# Patient Record
Sex: Female | Born: 1945
Health system: Southern US, Community
[De-identification: ages and names within clinical notes are randomized; demographics above are authoritative.]

## PROBLEM LIST (undated history)

## (undated) DIAGNOSIS — I10 Essential (primary) hypertension: Secondary | ICD-10-CM

## (undated) DIAGNOSIS — M069 Rheumatoid arthritis, unspecified: Secondary | ICD-10-CM

## (undated) DIAGNOSIS — F329 Major depressive disorder, single episode, unspecified: Secondary | ICD-10-CM

## (undated) DIAGNOSIS — M797 Fibromyalgia: Secondary | ICD-10-CM

## (undated) DIAGNOSIS — F32A Depression, unspecified: Secondary | ICD-10-CM

## (undated) DIAGNOSIS — F419 Anxiety disorder, unspecified: Secondary | ICD-10-CM

## (undated) DIAGNOSIS — R7309 Other abnormal glucose: Secondary | ICD-10-CM

## (undated) DIAGNOSIS — R739 Hyperglycemia, unspecified: Secondary | ICD-10-CM

## (undated) DIAGNOSIS — E042 Nontoxic multinodular goiter: Secondary | ICD-10-CM

## (undated) DIAGNOSIS — D649 Anemia, unspecified: Secondary | ICD-10-CM

## (undated) HISTORY — PX: ABDOMINAL HYSTERECTOMY: SHX81

## (undated) HISTORY — PX: BREAST BIOPSY: SHX20

## (undated) HISTORY — DX: Anemia, unspecified: D64.9

## (undated) HISTORY — PX: JOINT REPLACEMENT: SHX530

## (undated) HISTORY — DX: Hyperglycemia, unspecified: R73.9

## (undated) HISTORY — DX: Other abnormal glucose: R73.09

## (undated) HISTORY — DX: Rheumatoid arthritis, unspecified: M06.9

---

## 1997-12-25 ENCOUNTER — Ambulatory Visit (HOSPITAL_COMMUNITY): Admission: RE | Admit: 1997-12-25 | Discharge: 1997-12-25 | Payer: Self-pay | Admitting: *Deleted

## 1997-12-25 ENCOUNTER — Encounter: Payer: Self-pay | Admitting: *Deleted

## 1998-06-08 ENCOUNTER — Other Ambulatory Visit: Admission: RE | Admit: 1998-06-08 | Discharge: 1998-06-08 | Payer: Self-pay | Admitting: *Deleted

## 1998-09-10 ENCOUNTER — Ambulatory Visit (HOSPITAL_COMMUNITY): Admission: RE | Admit: 1998-09-10 | Discharge: 1998-09-10 | Payer: Self-pay | Admitting: Internal Medicine

## 1999-01-03 ENCOUNTER — Encounter: Payer: Self-pay | Admitting: *Deleted

## 1999-01-03 ENCOUNTER — Ambulatory Visit (HOSPITAL_COMMUNITY): Admission: RE | Admit: 1999-01-03 | Discharge: 1999-01-03 | Payer: Self-pay | Admitting: *Deleted

## 2000-03-23 ENCOUNTER — Encounter: Payer: Self-pay | Admitting: *Deleted

## 2000-03-23 ENCOUNTER — Ambulatory Visit (HOSPITAL_COMMUNITY): Admission: RE | Admit: 2000-03-23 | Discharge: 2000-03-23 | Payer: Self-pay | Admitting: *Deleted

## 2000-04-30 ENCOUNTER — Other Ambulatory Visit: Admission: RE | Admit: 2000-04-30 | Discharge: 2000-04-30 | Payer: Self-pay | Admitting: *Deleted

## 2000-05-23 ENCOUNTER — Encounter: Payer: Self-pay | Admitting: Internal Medicine

## 2000-05-23 ENCOUNTER — Ambulatory Visit (HOSPITAL_COMMUNITY): Admission: RE | Admit: 2000-05-23 | Discharge: 2000-05-23 | Payer: Self-pay | Admitting: Internal Medicine

## 2000-06-14 ENCOUNTER — Encounter (INDEPENDENT_AMBULATORY_CARE_PROVIDER_SITE_OTHER): Payer: Self-pay

## 2000-06-14 ENCOUNTER — Other Ambulatory Visit: Admission: RE | Admit: 2000-06-14 | Discharge: 2000-06-14 | Payer: Self-pay | Admitting: *Deleted

## 2000-10-23 ENCOUNTER — Encounter: Payer: Self-pay | Admitting: *Deleted

## 2000-10-30 ENCOUNTER — Encounter (INDEPENDENT_AMBULATORY_CARE_PROVIDER_SITE_OTHER): Payer: Self-pay

## 2000-10-30 ENCOUNTER — Inpatient Hospital Stay (HOSPITAL_COMMUNITY): Admission: RE | Admit: 2000-10-30 | Discharge: 2000-11-01 | Payer: Self-pay | Admitting: *Deleted

## 2000-10-30 ENCOUNTER — Encounter (INDEPENDENT_AMBULATORY_CARE_PROVIDER_SITE_OTHER): Payer: Self-pay | Admitting: Specialist

## 2001-03-26 ENCOUNTER — Encounter: Payer: Self-pay | Admitting: *Deleted

## 2001-03-26 ENCOUNTER — Ambulatory Visit (HOSPITAL_COMMUNITY): Admission: RE | Admit: 2001-03-26 | Discharge: 2001-03-26 | Payer: Self-pay | Admitting: *Deleted

## 2001-05-01 ENCOUNTER — Other Ambulatory Visit: Admission: RE | Admit: 2001-05-01 | Discharge: 2001-05-01 | Payer: Self-pay | Admitting: *Deleted

## 2002-04-30 ENCOUNTER — Ambulatory Visit (HOSPITAL_COMMUNITY): Admission: RE | Admit: 2002-04-30 | Discharge: 2002-04-30 | Payer: Self-pay | Admitting: *Deleted

## 2002-04-30 ENCOUNTER — Encounter: Payer: Self-pay | Admitting: *Deleted

## 2002-05-05 ENCOUNTER — Other Ambulatory Visit: Admission: RE | Admit: 2002-05-05 | Discharge: 2002-05-05 | Payer: Self-pay | Admitting: Obstetrics and Gynecology

## 2003-05-06 ENCOUNTER — Other Ambulatory Visit: Admission: RE | Admit: 2003-05-06 | Discharge: 2003-05-06 | Payer: Self-pay | Admitting: Obstetrics and Gynecology

## 2004-06-08 ENCOUNTER — Ambulatory Visit (HOSPITAL_COMMUNITY): Admission: RE | Admit: 2004-06-08 | Discharge: 2004-06-08 | Payer: Self-pay | Admitting: Obstetrics and Gynecology

## 2004-11-22 ENCOUNTER — Ambulatory Visit: Payer: Self-pay | Admitting: Internal Medicine

## 2004-11-24 ENCOUNTER — Ambulatory Visit (HOSPITAL_COMMUNITY): Admission: RE | Admit: 2004-11-24 | Discharge: 2004-11-24 | Payer: Self-pay | Admitting: Geriatric Medicine

## 2005-01-04 ENCOUNTER — Ambulatory Visit: Payer: Self-pay | Admitting: Internal Medicine

## 2005-07-05 ENCOUNTER — Ambulatory Visit (HOSPITAL_COMMUNITY): Admission: RE | Admit: 2005-07-05 | Discharge: 2005-07-05 | Payer: Self-pay | Admitting: Obstetrics and Gynecology

## 2005-11-08 ENCOUNTER — Ambulatory Visit: Payer: Self-pay | Admitting: Internal Medicine

## 2005-11-10 ENCOUNTER — Encounter: Admission: RE | Admit: 2005-11-10 | Discharge: 2005-11-10 | Payer: Self-pay | Admitting: Internal Medicine

## 2006-05-31 DIAGNOSIS — R946 Abnormal results of thyroid function studies: Secondary | ICD-10-CM | POA: Insufficient documentation

## 2006-05-31 DIAGNOSIS — F329 Major depressive disorder, single episode, unspecified: Secondary | ICD-10-CM

## 2006-05-31 DIAGNOSIS — F32A Depression, unspecified: Secondary | ICD-10-CM | POA: Insufficient documentation

## 2006-05-31 DIAGNOSIS — D649 Anemia, unspecified: Secondary | ICD-10-CM | POA: Insufficient documentation

## 2006-06-04 ENCOUNTER — Ambulatory Visit: Payer: Self-pay | Admitting: Internal Medicine

## 2006-06-04 ENCOUNTER — Encounter: Payer: Self-pay | Admitting: Internal Medicine

## 2006-06-05 LAB — CONVERTED CEMR LAB
Basophils Relative: 0.2 % (ref 0.0–1.0)
Creatinine, Ser: 0.8 mg/dL (ref 0.4–1.2)
Eosinophils Absolute: 0.1 10*3/uL (ref 0.0–0.6)
Folate: >20 ng/mL
Hemoglobin: 11.6 g/dL — ABNORMAL LOW (ref 12.0–15.0)
Hgb A1c MFr Bld: 6.2 % — ABNORMAL HIGH (ref 4.6–6.0)
Lymphocytes Relative: 20.3 % (ref 12.0–46.0)
MCV: 87.3 fL (ref 78.0–100.0)
Monocytes Absolute: 0.3 10*3/uL (ref 0.2–0.7)
Monocytes Relative: 8.1 % (ref 3.0–11.0)
Neutro Abs: 3 10*3/uL (ref 1.4–7.7)
Potassium: 3.9 meq/L (ref 3.5–5.1)

## 2007-08-22 ENCOUNTER — Ambulatory Visit: Payer: Self-pay | Admitting: Internal Medicine

## 2007-08-22 DIAGNOSIS — I10 Essential (primary) hypertension: Secondary | ICD-10-CM | POA: Insufficient documentation

## 2007-08-22 DIAGNOSIS — I152 Hypertension secondary to endocrine disorders: Secondary | ICD-10-CM | POA: Insufficient documentation

## 2007-08-22 DIAGNOSIS — R7309 Other abnormal glucose: Secondary | ICD-10-CM | POA: Insufficient documentation

## 2007-08-22 DIAGNOSIS — E049 Nontoxic goiter, unspecified: Secondary | ICD-10-CM | POA: Insufficient documentation

## 2007-08-22 DIAGNOSIS — E1159 Type 2 diabetes mellitus with other circulatory complications: Secondary | ICD-10-CM | POA: Insufficient documentation

## 2007-08-22 HISTORY — DX: Other abnormal glucose: R73.09

## 2007-08-31 LAB — CONVERTED CEMR LAB
ALT: 25 units/L (ref 0–35)
Albumin: 3.8 g/dL (ref 3.5–5.2)
BUN: 16 mg/dL (ref 6–23)
Basophils Absolute: 0.1 10*3/uL (ref 0.0–0.1)
Basophils Relative: 1.9 % (ref 0.0–3.0)
Cholesterol: 155 mg/dL (ref 0–200)
Creatinine, Ser: 0.8 mg/dL (ref 0.4–1.2)
Creatinine,U: 122.8 mg/dL
Eosinophils Absolute: 0.1 10*3/uL (ref 0.0–0.7)
Free T4: 0.8 ng/dL (ref 0.6–1.6)
Hemoglobin: 11.9 g/dL — ABNORMAL LOW (ref 12.0–15.0)
MCHC: 34.2 g/dL (ref 30.0–36.0)
MCV: 89.2 fL (ref 78.0–100.0)
Microalb, Ur: 0.2 mg/dL (ref 0.0–1.9)
Monocytes Absolute: 0.4 10*3/uL (ref 0.1–1.0)
Neutro Abs: 2.1 10*3/uL (ref 1.4–7.7)
RBC: 3.91 M/uL (ref 3.87–5.11)
Saturation Ratios: 15.2 % — ABNORMAL LOW (ref 20.0–50.0)
TSH: 2.56 microintl units/mL (ref 0.35–5.50)
Total Protein: 8.7 g/dL — ABNORMAL HIGH (ref 6.0–8.3)
Triglycerides: 57 mg/dL (ref 0–149)

## 2007-09-02 ENCOUNTER — Encounter (INDEPENDENT_AMBULATORY_CARE_PROVIDER_SITE_OTHER): Payer: Self-pay | Admitting: *Deleted

## 2007-09-17 ENCOUNTER — Encounter: Admission: RE | Admit: 2007-09-17 | Discharge: 2007-09-17 | Payer: Self-pay | Admitting: Internal Medicine

## 2007-09-18 ENCOUNTER — Encounter (INDEPENDENT_AMBULATORY_CARE_PROVIDER_SITE_OTHER): Payer: Self-pay | Admitting: *Deleted

## 2007-10-24 ENCOUNTER — Ambulatory Visit (HOSPITAL_COMMUNITY): Admission: RE | Admit: 2007-10-24 | Discharge: 2007-10-24 | Payer: Self-pay | Admitting: Obstetrics and Gynecology

## 2007-12-17 ENCOUNTER — Ambulatory Visit: Payer: Self-pay | Admitting: Family Medicine

## 2007-12-17 ENCOUNTER — Telehealth (INDEPENDENT_AMBULATORY_CARE_PROVIDER_SITE_OTHER): Payer: Self-pay | Admitting: *Deleted

## 2007-12-20 ENCOUNTER — Telehealth: Payer: Self-pay | Admitting: Family Medicine

## 2007-12-24 ENCOUNTER — Ambulatory Visit: Payer: Self-pay | Admitting: Internal Medicine

## 2008-07-08 ENCOUNTER — Encounter (INDEPENDENT_AMBULATORY_CARE_PROVIDER_SITE_OTHER): Payer: Self-pay | Admitting: *Deleted

## 2008-11-26 ENCOUNTER — Encounter (INDEPENDENT_AMBULATORY_CARE_PROVIDER_SITE_OTHER): Payer: Self-pay | Admitting: *Deleted

## 2009-01-12 ENCOUNTER — Ambulatory Visit: Payer: Self-pay | Admitting: Internal Medicine

## 2009-01-12 DIAGNOSIS — M1990 Unspecified osteoarthritis, unspecified site: Secondary | ICD-10-CM | POA: Insufficient documentation

## 2009-01-14 ENCOUNTER — Encounter (INDEPENDENT_AMBULATORY_CARE_PROVIDER_SITE_OTHER): Payer: Self-pay | Admitting: *Deleted

## 2009-01-14 ENCOUNTER — Telehealth (INDEPENDENT_AMBULATORY_CARE_PROVIDER_SITE_OTHER): Payer: Self-pay | Admitting: *Deleted

## 2009-01-14 ENCOUNTER — Encounter: Payer: Self-pay | Admitting: Internal Medicine

## 2009-01-14 DIAGNOSIS — M2550 Pain in unspecified joint: Secondary | ICD-10-CM | POA: Insufficient documentation

## 2009-01-14 LAB — CONVERTED CEMR LAB
Alkaline Phosphatase: 105 units/L (ref 39–117)
BUN: 12 mg/dL (ref 6–23)
Basophils Relative: 1.4 % (ref 0.0–3.0)
Bilirubin, Direct: 0.1 mg/dL (ref 0.0–0.3)
Chloride: 101 meq/L (ref 96–112)
Cholesterol: 170 mg/dL (ref 0–200)
Eosinophils Relative: 4.9 % (ref 0.0–5.0)
Free T4: 1 ng/dL (ref 0.6–1.6)
HCT: 34.8 % — ABNORMAL LOW (ref 36.0–46.0)
Hgb A1c MFr Bld: 6 % (ref 4.6–6.5)
LDL Cholesterol: 99 mg/dL (ref 0–99)
MCV: 91.2 fL (ref 78.0–100.0)
Platelets: 239 10*3/uL (ref 150.0–400.0)
Potassium: 4.3 meq/L (ref 3.5–5.1)
Rhuematoid fact SerPl-aCnc: 400.6 intl units/mL — ABNORMAL HIGH (ref 0.0–20.0)
Sed Rate: 57 mm/hr — ABNORMAL HIGH (ref 0–22)
WBC: 5.7 10*3/uL (ref 4.5–10.5)

## 2009-02-01 ENCOUNTER — Telehealth: Payer: Self-pay | Admitting: Internal Medicine

## 2009-02-01 ENCOUNTER — Encounter: Payer: Self-pay | Admitting: Internal Medicine

## 2009-02-02 ENCOUNTER — Encounter: Admission: RE | Admit: 2009-02-02 | Discharge: 2009-02-02 | Payer: Self-pay | Admitting: Internal Medicine

## 2009-02-23 ENCOUNTER — Ambulatory Visit (HOSPITAL_COMMUNITY): Admission: RE | Admit: 2009-02-23 | Discharge: 2009-02-23 | Payer: Self-pay | Admitting: Obstetrics and Gynecology

## 2009-03-01 ENCOUNTER — Encounter: Admission: RE | Admit: 2009-03-01 | Discharge: 2009-03-01 | Payer: Self-pay | Admitting: Obstetrics and Gynecology

## 2009-05-18 ENCOUNTER — Encounter: Payer: Self-pay | Admitting: Internal Medicine

## 2009-06-29 ENCOUNTER — Encounter: Payer: Self-pay | Admitting: Internal Medicine

## 2009-08-13 ENCOUNTER — Encounter: Payer: Self-pay | Admitting: Internal Medicine

## 2009-11-03 ENCOUNTER — Encounter: Payer: Self-pay | Admitting: Internal Medicine

## 2010-02-01 ENCOUNTER — Encounter: Payer: Self-pay | Admitting: Internal Medicine

## 2010-02-17 NOTE — Progress Notes (Signed)
Summary: Schedule Colonoscopy  Phone Note Outgoing Call Call back at [facility] Phone [phone]   Call placed by: Harlow Mares CMA Duncan Dull),  February 01, 2009 2:06 PM Call placed to: Patient Summary of Call: Left message on patients machine to call back. patient needs direct colonoscopy. Initial call taken by: Harlow Mares CMA Duncan Dull),  February 01, 2009 2:06 PM  Follow-up for Phone Call        Patient can't schedule at this time because her husband has too many health problems but that she will schedule within this year Follow-up by: Harlow Mares CMA Duncan Dull),  February 01, 2009 4:14 PM

## 2010-02-17 NOTE — Progress Notes (Signed)
Summary: Medication Request  Phone Note Call from Patient Call back at Home Phone [phone]   Caller: Patient Summary of Call: Patient faxed over a letter stating: Until I can see specialist for my arthritis I need pain meds. Dr.Hopper recommended Tramadol yesterday but didnt know how it would react with other meds. Patient spoke with a pharmacist at [facility] about drug reactions and he said she should be able to take the Tramadol with her other meds.  Patient would like for Dr.Hopper to proceed with Rx for Tramadol or some other medication for joint pain.  [facility] on [address], please call patient to let her know the outcome  Follow-up for Phone Call        Patient aware RX sent. Follow-up by: Shonna Chock,  January 14, 2009 1:35 PM    New/Updated Medications: TRAMADOL HCL 50 MG TABS (TRAMADOL HCL) 1 by mouth every 6 hours as needed Prescriptions: TRAMADOL HCL 50 MG TABS (TRAMADOL HCL) 1 by mouth every 6 hours as needed  #30 x 1   Entered by:   Shonna Chock   Authorized by:   Marga Melnick MD   Signed by:   Shonna Chock on 01/14/2009   Method used:   Electronically to        [facility] # [phone]* (retail)       [address]       [place], [place]  [address]       Ph: [phone]       Fax: [phone]   RxID:   [id number]

## 2010-02-17 NOTE — Letter (Signed)
Summary: [facility]   Imported By: Lanelle Bal 07/20/2009 08:03:59  _____________________________________________________________________  External Attachment:    Type:   Image     Comment:   External Document

## 2010-02-17 NOTE — Consult Note (Signed)
Summary: [facility]   Imported By: Lanelle Bal 02/16/2009 08:37:41  _____________________________________________________________________  External Attachment:    Type:   Image     Comment:   External Document

## 2010-02-17 NOTE — Letter (Signed)
Summary: [facility]   Imported By: Lanelle Bal 09/06/2009 08:17:39  _____________________________________________________________________  External Attachment:    Type:   Image     Comment:   External Document

## 2010-02-17 NOTE — Letter (Signed)
Summary: [facility]   Imported By: Lanelle Bal 06/19/2009 09:20:37  _____________________________________________________________________  External Attachment:    Type:   Image     Comment:   External Document

## 2010-02-17 NOTE — Letter (Signed)
Summary: [facility]   Imported By: Lanelle Bal 11/17/2009 12:07:49  _____________________________________________________________________  External Attachment:    Type:   Image     Comment:   External Document

## 2010-03-03 NOTE — Letter (Signed)
Summary: [facility]   Imported By: Lanelle Bal 02/21/2010 14:11:11  _____________________________________________________________________  External Attachment:    Type:   Image     Comment:   External Document

## 2010-03-30 ENCOUNTER — Encounter (INDEPENDENT_AMBULATORY_CARE_PROVIDER_SITE_OTHER): Payer: BC Managed Care – PPO | Admitting: Internal Medicine

## 2010-03-30 ENCOUNTER — Encounter: Payer: Self-pay | Admitting: Internal Medicine

## 2010-03-30 ENCOUNTER — Other Ambulatory Visit: Payer: Self-pay | Admitting: Internal Medicine

## 2010-03-30 DIAGNOSIS — E049 Nontoxic goiter, unspecified: Secondary | ICD-10-CM

## 2010-03-30 DIAGNOSIS — Z0000 Encounter for general adult medical examination without abnormal findings: Secondary | ICD-10-CM

## 2010-03-30 DIAGNOSIS — Z23 Encounter for immunization: Secondary | ICD-10-CM

## 2010-03-30 DIAGNOSIS — R7309 Other abnormal glucose: Secondary | ICD-10-CM

## 2010-03-30 DIAGNOSIS — I10 Essential (primary) hypertension: Secondary | ICD-10-CM

## 2010-03-30 DIAGNOSIS — M069 Rheumatoid arthritis, unspecified: Secondary | ICD-10-CM

## 2010-03-30 DIAGNOSIS — D649 Anemia, unspecified: Secondary | ICD-10-CM

## 2010-03-30 LAB — T4, FREE: Free T4: 0.93 ng/dL (ref 0.60–1.60)

## 2010-03-30 LAB — BASIC METABOLIC PANEL
Calcium: 8.8 mg/dL (ref 8.4–10.5)
GFR: 77.11 mL/min (ref >60.00)
Sodium: 140 mEq/L (ref 135–145)

## 2010-03-30 LAB — LIPID PANEL
HDL: 49 mg/dL (ref >39.00)
Total CHOL/HDL Ratio: 4
VLDL: 9.2 mg/dL (ref 0.0–40.0)

## 2010-03-30 LAB — B12 AND FOLATE PANEL: Vitamin B-12: 273 pg/mL (ref 211–911)

## 2010-03-31 ENCOUNTER — Other Ambulatory Visit: Payer: Self-pay | Admitting: Internal Medicine

## 2010-03-31 DIAGNOSIS — E049 Nontoxic goiter, unspecified: Secondary | ICD-10-CM

## 2010-04-04 ENCOUNTER — Other Ambulatory Visit: Payer: Self-pay | Admitting: Rheumatology

## 2010-04-04 ENCOUNTER — Ambulatory Visit
Admission: RE | Admit: 2010-04-04 | Discharge: 2010-04-04 | Disposition: A | Discharge status: Home / Self Care | Payer: BC Managed Care – PPO | Source: Ambulatory Visit | Attending: Internal Medicine | Admitting: Internal Medicine

## 2010-04-04 DIAGNOSIS — E049 Nontoxic goiter, unspecified: Secondary | ICD-10-CM

## 2010-04-04 DIAGNOSIS — M79671 Pain in right foot: Secondary | ICD-10-CM

## 2010-04-05 NOTE — Assessment & Plan Note (Signed)
Summary: CPX/FASTING LABS--SCM   Vital Signs:  Patient profile:   65 year old female Height:      63.75 inches Weight:      162 pounds BMI:     28.13 Temp:     98.4 degrees F oral Pulse rate:   84 / minute Resp:     14 per minute BP sitting:   128 / 80  (left arm) Cuff size:   large  Vitals Entered By: Shonna Chock CMA (March 30, 2010 9:35 AM) CC: CPX with fasting labs , Type 2 diabetes mellitus follow-up   CC:  CPX with fasting labs  and Type 2 diabetes mellitus follow-up.  History of Present Illness:    Haley Roberts is here for a physical; she has had numbness in R foot for > 6 weeks . Dr Dareen Piano  ordered NCT/EMG ; the [facility] stated posterior tibial nerve compression present. Dr Dareen Piano is awaiting response to Remicade which has not been started yet.    Hypertension Follow-Up: She  reports some  fatigue, but denies lightheadedness, urinary frequency, headaches, and edema.  Associated symptoms include dyspnea  with stairs.  The patient denies the following associated symptoms: chest pain, chest pressure, exercise intolerance, palpitations, and syncope.  Compliance with medications (by patient report) has been near 100%.  The patient reports that dietary compliance has been good.  Adjunctive measures currently used by the patient include salt restriction.  BP @ home not checked.    PMH of A1c of 6.2%; strong FH of DM. She  denies polydipsia and blurred vision.  The patient denies the following symptoms: neuropathic pain, poor wound healing, intermittent claudication, and foot ulcer.  she is  not monitoring blood glucose.  Since the last visit, the patient reports having had eye care by an ophthalmologist; no retinopathy but early cataracts.    Preventive Screening-Counseling & Management  Alcohol-Tobacco     Smoking Status: never  Caffeine-Diet-Exercise     Does Patient Exercise: yes  Allergies: 1)  ! Sulfa  Past History:  Past Medical History: Hypertension Multinodular  goiter, PMH of ;  Depression, Dr Evelene Croon Rheumatoid arthritis Anemia, PMH of    ; Total Protein 8.5, PMH of                    Hyperglycemia ( A1c 6.2% in 2008)  Past Surgical History: breast biopsy X2;lumpectomy X 1; Hysterectomy & BSO for ovarian cyst 2002; G 3 P 3 Colonoscopy 2002, Dr Juanda Chance ( due 2012)  Family History: No CVA, MI Father: ? 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         Weight loss 20# with diet.  Physical Exam  General:  well-nourished; alert,appropriate and cooperative throughout examination Head:  Normocephalic and atraumatic without obvious abnormalities.  Eyes:  No corneal or conjunctival inflammation noted. Perrla. Funduscopic exam benign, without hemorrhages, exudates or papilledema.  Ears:  External ear exam shows no significant lesions or deformities.  Otoscopic examination reveals clear canals, tympanic membranes are intact bilaterally without bulging, retraction, inflammation or discharge. Hearing is grossly normal bilaterally. Nose:  External nasal examination shows no deformity or inflammation. Nasal mucosa are pink and moist without lesions or exudates. Mouth:  Oral mucosa and oropharynx without lesions or exudates.  Teeth in good repair. Neck:  No deformities, masses, or tenderness noted. asymmetric goiter Breasts:  Debbora Dus , NP Lungs:  Normal respiratory effort, chest expands symmetrically. Lungs are clear to auscultation, no crackles  or wheezes. Heart:  Normal rate and regular rhythm. S1 and S2 normal without gallop, murmur, click, rub .S4 with slurring Abdomen:  Bowel sounds positive,abdomen soft and non-tender without masses, organomegaly or hernias noted. Genitalia:  Debbora Dus , NP Msk:  "Dowager's hump" Pulses:  R and L carotid,radial,dorsalis pedis and posterior tibial pulses are full and equal bilaterally Extremities:  No clubbing, cyanosis, edema. DIP OA type changes. Good nail health Neurologic:  alert & oriented X3, sensation intact to light touch over feet, and DTRs symmetrical and normal.   Skin:  Intact without suspicious lesions or rashes Cervical Nodes:  No lymphadenopathy noted Axillary Nodes:  No palpable lymphadenopathy Psych:  memory intact for recent and remote, normally interactive, and good eye contact.     Impression & Recommendations:  Problem # 1:  ROUTINE GENERAL MEDICAL EXAM@HEALTH  CARE FACL (ICD-V70.0)  Orders: EKG w/ Interpretation (93000) Venipuncture (16109) TLB-B12 + Folate Pnl (60454_09811-B14/NWG) TLB-IBC Pnl (Iron/FE;Transferrin) (83550-IBC) TLB-Lipid Panel (80061-LIPID) TLB-BMP (Basic Metabolic Panel-BMET) (80048-METABOL) TLB-TSH (Thyroid Stimulating Hormone) (84443-TSH) TLB-T4 (Thyrox), Free [phone]) Radiology Referral (Radiology)  Problem # 2:  HYPERTENSION (ICD-401.9) controlled Her updated medication list for this problem includes:    Benazepril Hcl 20 Mg Tabs (Benazepril hcl) .Marland Kitchen... Take one tablet daily  Problem # 3:  HYPERGLYCEMIA, FASTING (ICD-790.29) FH DM  Problem # 4:  GOITER, UNSPECIFIED (ICD-240.9)  Orders: Radiology Referral (Radiology)  Problem # 5:  ANEMIA-NOS (ICD-285.9)  Her updated medication list for this problem includes:    Folic Acid 1 Mg Tabs (Folic acid) .Marland Kitchen... 2 by mouth once daily  Problem # 6:  RHEUMATOID ARTHRITIS (ICD-714.0) as per Dr Dareen Piano  Complete Medication List: 1)  Benazepril Hcl 20 Mg Tabs (Benazepril hcl) .... Take one  tablet daily 2)  Clorazepate Dipotassium 3.75 Mg Tabs (Clorazepate dipotassium) .... 2-3 by mouth once daily as needed 3)  Lexapro 10 Mg Tabs (Escitalopram oxalate) .Marland Kitchen.. 1 by mouth once daily 4)  Etodolac 400 Mg Tabs (Etodolac) .Marland Kitchen.. 1 by mouth once daily 5)  Vivelle-dot 0.025 Mg/24hr Pttw (Estradiol) .Marland Kitchen.. 1 patch twice weekly 6)  Womens One Daily Tabs (Multiple vitamins-minerals) .Marland Kitchen.. 1 by mouth once daily 7)  Vitamin E 400 Unit Caps (Vitamin e) .Marland Kitchen.. 1 by mouth once daily 8)  Vitamin C 500 Mg Tabs (Ascorbic acid) .Marland Kitchen.. 1 by mouth once daily 9)  Caltrate 600 1500 Mg Tabs (Calcium carbonate) .Marland Kitchen.. 1 by mouth two times a day 10)  Desipramine Hcl 75 Mg Tabs (Desipramine hcl) .Marland Kitchen.. 1 by mouth once daily 11)  Tramadol Hcl 50 Mg Tabs (Tramadol hcl) .Marland Kitchen.. 1 by mouth every 6 hours as needed 12)  Folic Acid 1 Mg Tabs (Folic acid) .... 2 by mouth once daily 13)  Methotrextrate 0.6  .... 1 injection weekly, dr.anderson 14)  Naproxen 500 Mg Tabs (Naproxen) .... Two times a day  Other Orders: Tdap => 6yrs IM (57846) Admin 1st Vaccine (96295)  Patient Instructions: 1)  Schedule a colonoscopy  to help detect colon cancer as per [facility]. 2)  Check your Blood Pressure regularly. If it is above:135/85 ON AVERAGE  you should make an  appointment. Discuss foot symptoms with Dr Dareen Piano. BMD every 25 months   Orders Added: 1)  Tdap => 65yrs IM [90715] 2)  Admin 1st Vaccine [90471] 3)  Est. Patient 40-64 years [99396] 4)  EKG w/ Interpretation [93000] 5)  Venipuncture [36415] 6)  TLB-B12 + Folate Pnl [82746_82607-B12/FOL] 7)  TLB-IBC Pnl (Iron/FE;Transferrin) [83550-IBC] 8)  TLB-Lipid Panel [80061-LIPID] 9)  TLB-BMP (Basic Metabolic Panel-BMET) [80048-METABOL] 10)  TLB-TSH (Thyroid Stimulating Hormone) [84443-TSH] 11)  TLB-T4 (Thyrox), Free [16109-UE4V] 12)  Radiology Referral [Radiology]   Immunizations Administered:  Tetanus Vaccine:    Vaccine Type: Tdap    Site: right deltoid    Mfr: GlaxoSmithKline     Dose: 0.5 ml    Route: IM    Given by: Shonna Chock CMA    Exp. Date: 12/10/2011    Lot #: [id number]    VIS given: 12/04/07 version given March 30, 2010.   Immunizations Administered:  Tetanus Vaccine:    Vaccine Type: Tdap    Site: right deltoid    Mfr: GlaxoSmithKline    Dose: 0.5 ml    Route: IM    Given by: Shonna Chock CMA    Exp. Date: 12/10/2011    Lot #: [id number]    VIS given: 12/04/07 version given March 30, 2010.    Appended Document: CPX/FASTING LABS--SCM

## 2010-04-06 ENCOUNTER — Ambulatory Visit
Admission: RE | Admit: 2010-04-06 | Discharge: 2010-04-06 | Disposition: A | Discharge status: Home / Self Care | Source: Ambulatory Visit | Attending: Rheumatology | Admitting: Rheumatology

## 2010-04-06 DIAGNOSIS — M79671 Pain in right foot: Secondary | ICD-10-CM

## 2010-04-08 ENCOUNTER — Other Ambulatory Visit

## 2010-05-02 ENCOUNTER — Other Ambulatory Visit: Payer: Self-pay

## 2010-05-02 MED ORDER — BENAZEPRIL HCL 20 MG PO TABS: 20.0000 mg | ORAL_TABLET | Freq: Every day | ORAL | Status: DC

## 2010-06-03 NOTE — Op Note (Signed)
[facility]  Patient:    Haley Roberts, Haley Roberts Visit Number: [id number] MRN: [id number]          Service Type: GYN Location: [phone] Attending Physician:  Ardeen Fillers Dictated by:   Sung Amabile [name], M.D. Proc. Date: 10/30/00 Admit Date:  10/30/2000                             Operative Report  PREOPERATIVE DIAGNOSES:       1. Right adnexal cyst.                               2. Fibroids.                               3. Elevated CA-125.                               4. Postmenopausal bleeding.  POSTOPERATIVE DIAGNOSES:      1. Right adnexal cyst.                               2. Fibroids.                               3. Elevated CA-125.                               4. Postmenopausal bleeding.  PROCEDURES:                   1. Abdominal hysterectomy.                               2. Bilateral salpingo-oophorectomy.                               3. Uterosacral ligament plication.  SURGEON:                      Sung Amabile. [name], M.D.  ASSISTANT:                    Pershing Cox, M.D.  ANESTHESIA:                   General with endotracheal tube intubation.  ESTIMATED BLOOD LOSS:         350 cc.  TUBES AND DRAINS:             Foley.  COMPLICATIONS:                None.  FINDINGS:                     Top normal sized anteverted uterus.  A 7 cm right adnexal cyst - ovarian.  Left ovary with small cystic areas on the serosa.  Normal tubes and upper abdomen.  SPECIMENS:                    Right ovary and cyst to pathology  for frozen section - benign.  Left ovary benign on frozen section.  Uterus and tubes to pathology.  Pelvic washings to cytology.  INDICATIONS:                  A 65 year old woman, postmenopausal by [facility] in April, who was noted to have a 7 cm right adnexal cyst incidentally when ultrasound was performed for evaluation of endometrial thickness.  CA-125 was 115.  She is admitted now for total abdominal  hysterectomy, bilateral salpingo-oophorectomy and exploratory laparotomy for further evaluation of this mass.  She has been counseled preoperatively regarding the benefits, risks, options and expected outcome of this procedure.  Frozen section is planned intraoperatively.  DESCRIPTION OF PROCEDURE:     After establishment of general anesthesia, the patients abdomen, perineum and vagina were prepped and a Foley catheter was placed.  The patient was draped.  A lower abdominal vertical incision was made on the skin with a knife.  The fat was dissected bluntly.  The fascia was identified and nicked in the midline. The fascial incision was carried superiorly and inferiorly.  The peritoneum was visualized, grasped in a clear space and entered there sharply, taking care to avoid injury to underlying viscera.  The peritoneum was opened and pelvic washings were taken with 500 cc of normal saline.  The upper and was explored and noted to be normal. Buchwalter self retaining retractor was placed.  Bowel was carefully packed. Unless otherwise noted, 0 Vicryl suture material was used.  The right round ligament was identified, transfixed with suture and transected.  The broad ligament was opened and the ureter on the right was noted.  The right infundibulopelvic ligament and vessels were isolated, clamped well above the right ureter, transected and doubly ligated, first with a free tie and then suture ligature.  The mesosalpinx on the right was then clamped and the right tube and ovary with its cyst was excised and sent for frozen section.  The left round ligament was identified, transfixed and transected.  The broad ligament anteriorly was opened and the bladder was advanced caudad, thus exposing the white endopelvic fascia of the cervix.  The broad ligament was further opened and the course of the left ureter was noted.  The left infundibulopelvic ligaments and vessels were isolated, clamped and  doubly ligated, first with a free tie and then with a suture ligature well above the left ureter.  Because of the cystic appearance of the left ovary this, too, was sent for frozen section.  The uterine vessels were skeletonized sharply.  They were then clamped and transected.  Serial parametrial bites were taken, with each of these pedicles being clamped, transected and then suture ligated.  The cervix was removed from the vagina at the cervicovaginal junction.  The vaginal cuff was reefed. It was then closed from side to side with interrupted sutures.  The uterosacral ligaments had been identified during the dissection of the uterus.  Because of the depth of the pelvis, the uterosacral ligaments were plicated in a Moschcowitz fashion.  Hemostasis was noted.  The peritoneal cavity was irrigated with warm normal saline.  Sponges and instruments were removed.  The rectosigmoid was placed in the pelvis.  Initial sponge, needle and blade counts were correct.  The anterior abdominal wall was closed in layers.  The first layer was a running stitch of #1 PDS.  The sutures were tied in the midline and the knot was then buried.  The fat was irrigated with  warm normal saline. Electrocautery was used for hemostasis.  The skin was closed with staples. Final sponge, needle and blade counts were correct.  The urine was clear at the end of the case.  The patient was extubated without difficulty and transported to the recovery room in satisfactory condition. Dictated by:   Sung Amabile [name], M.D. Attending Physician:  Ardeen Fillers DD:  10/30/00 TD:  10/30/00 Job: [phone] [id number]

## 2010-06-03 NOTE — H&P (Signed)
[facility]  Patient:    Haley Roberts, Haley Roberts Visit Number: [id number] MRN: [id number]          Service Type: Attending:  Sung Amabile. [name], M.D. Dictated by:   Sung Amabile [name], M.D. Adm. Date:  10/30/00                           History and Physical  DATE OF BIRTH:  [birth year]  CHIEF COMPLAINT:  Lower abdominal pain, right adnexal cyst, and elevated CA 125.  HISTORY OF PRESENT ILLNESS:  [age]-year-old woman, postmenopausal by [facility] of 53 in April 2002, is admitted for exploratory laparotomy, TAH/BSO for a 7 cm right adnexal simple cystic mass which was found incidentally on ultrasound for evaluation of postmenopausal bleeding in September.  The cyst has a completely simple appearance, and there was no free fluid in the pelvis, but CA 125 was elevated at 115.  She has known fibroids.  Endometrial thickness of 0.9 was noted, and hyperechoic masses measuring 0.7 x 0.5 and 0.4 x 0.4 were also noted.  Endometrial biopsy at the end of May in the office revealed benign proliferative endometrium.  Pap smear was normal in April, and mammogram was normal in March 2002.  The patient has no constitutional symptoms, bowel symptoms, or lower abdominal pain.  The patient has not taken hormones and has continued to bleed every 27-33 days.  PAST MEDICAL HISTORY: 1. History of intermittently high intraocular pressure.  The patient does not    have glaucoma. 2. History of depression. 3. Normal colonoscopy in 2000.  PAST SURGICAL HISTORY:  Breast biopsies x 3.  MEDICATIONS: 1. Tranxene 3.75 q.6h. p.r.n. anxiety. 2. Desipramine 75 mg at bedtime. 3. Multivitamin. 4. Vitamin E. 5. Vitamin C. 6. Claritin as needed. 7. Calcium.  ALLERGIES:  SULFA causes FEVER AND SHAKINESS.  OBSTETRICAL HISTORY:  The patient has had three vaginal deliveries.  FAMILY HISTORY:  Mother died at age 30 of breast carcinoma.  Sister with diabetes mellitus and hypertension.   Father died in his 46s and had a history of hypertension.  SOCIAL HISTORY:  Married.  Homemaker.  Drinks one to two glasses of wine per week.  Denies tobacco use.  REVIEW OF SYSTEMS:  Otherwise unremarkable.  PHYSICAL EXAMINATION:  GENERAL:  Healthy-appearing woman in no apparent distress.  HEENT:  Within normal limits.  Sclerae anicteric.  NECK:  Without thyromegaly.  Supple.  CHEST:  Clear.  HEART:  Regular rate and rhythm.  S1, S2 normal, without murmur.  BREASTS:  Well-healed surgical scars.  Without mass, tenderness, axillary or supraclavicular nodes.  ABDOMEN:  Soft and nontender.  Without organomegaly, mass, or hernia.  BACK:  Without CVAT.  GENITOURINARY:  External genitalia, BUS, vagina without lesion.  Good pelvic support.  Cervix without lesion.  Uterus retroverted, top normal size, nontender, mobile.  Adnexa nonpalpable bilaterally.  RECTOVAGINAL:  Confirmatory.  EXTREMITIES:  Without CCE.  SKIN:  Without lesions.  NEUROLOGIC:  Grossly intact.  ASSESSMENT: 1. Right adnexal cyst:  Differential diagnosis ovarian or peritubal cyst. 2. Elevated CA 125. 3. Postmenopausal bleeding with normal endometrial biopsy four months ago.    Ultrasound was suggestive of endometrial masses on ultrasound which are    consistent with fibroids or polyps.  PLAN:  Exploratory laparotomy, TAH/BSO.  Frozen section of cystic mass will be performed intraoperatively.  If malignant, staging laparotomy will be performed by Dr. Gildardo Griffes at that time.  The patient has been  counseled regarding the benefits, risks, options, and expected outcome of the procedure preoperatively.  Questions have been answered, and consent has been given. Dictated by:   Sung Amabile [name], M.D. Attending:  Sung Amabile. [name], M.D. DD:  10/28/00 TD:  10/28/00 Job: [id number]

## 2010-06-03 NOTE — Discharge Summary (Signed)
[facility]  Patient:    Haley Roberts, Haley Roberts Visit Number: [id number] MRN: [id number]          Service Type: GYN Location: [id number] Attending Physician:  Ardeen Fillers Dictated by:   Sung Amabile [name], M.D. Admit Date:  10/30/2000 Discharge Date: 11/01/2000                             Discharge Summary  DISCHARGE DIAGNOSES: 1. Benign endometrioid adenofibroma of the ovary. 2. Uterine endometriosis. 3. Endometrial polyp. 4. Intramural leiomyoma. 5. Postmenopausal bleeding. 6. Anemia.  OPERATIVE PROCEDURES:  Total abdominal hysterectomy and bilateral salpingo-oophorectomy and uterosacral ligament plication.  HISTORY OF PRESENT ILLNESS:  This is a 65 year old woman, postmenopausal by [facility] in April, was noted to have a 7 mm right adnexal cyst incidentally when ultrasound was performed for evaluation of endometrial thickness after postmenopausal bleeding was noted.  CA 125 was 115.  She is admitted for exploratory laparotomy, total abdominal hysterectomy and bilateral salpingo-oophorectomy.  HOSPITAL COURSE:  She was admitted and taken to the operating room at [facility] on October 30, 2000.  She underwent exploratory laparotomy, total abdominal hysterectomy and uterosacral ligament plication by Dr. Jake Church under general anesthesia.  Estimated blood loss of 350 cc and there were no complications.  The patients postoperative course was remarkable for anemia which was well tolerated.  Her diet was advanced without difficulties.  She was discharged to home with a hemoglobin of 9.6, in satisfactory condition on postoperative day #2.  She was given routine status post laparotomy instructions.  MEDICATIONS AT DISCHARGE: 1. Iron two times daily. 2. Multivitamin. 3. Ibuprofen. 4. Tylox 1-2 every four hours as needed.  FOLLOWUP:  She will be followed up by Dr. [name] for staple removal on November 05, 2000 in the office.  FINAL  PATHOLOGY:  Revealed benign endometrioid adenofibroma, benign ovarian hemorrhagic cyst, adenomyosis, leiomyomata uteri and uterine serosal endometriosis and benign endometrial polyp. Dictated by:   Sung Amabile [name], M.D. Attending Physician:  Ardeen Fillers DD:  11/22/00 TD:  11/24/00 Job: [id number]

## 2010-06-30 ENCOUNTER — Other Ambulatory Visit: Payer: Self-pay | Admitting: Obstetrics and Gynecology

## 2010-06-30 DIAGNOSIS — R922 Inconclusive mammogram: Secondary | ICD-10-CM

## 2010-06-30 DIAGNOSIS — N951 Menopausal and female climacteric states: Secondary | ICD-10-CM

## 2010-07-26 ENCOUNTER — Other Ambulatory Visit: Payer: Self-pay | Admitting: Obstetrics and Gynecology

## 2010-07-26 DIAGNOSIS — R922 Inconclusive mammogram: Secondary | ICD-10-CM

## 2010-07-27 ENCOUNTER — Ambulatory Visit
Admission: RE | Admit: 2010-07-27 | Discharge: 2010-07-27 | Disposition: A | Discharge status: Home / Self Care | Payer: BC Managed Care – PPO | Source: Ambulatory Visit | Attending: Obstetrics and Gynecology | Admitting: Obstetrics and Gynecology

## 2010-07-27 DIAGNOSIS — N951 Menopausal and female climacteric states: Secondary | ICD-10-CM

## 2010-08-01 ENCOUNTER — Ambulatory Visit
Admission: RE | Admit: 2010-08-01 | Discharge: 2010-08-01 | Disposition: A | Discharge status: Home / Self Care | Payer: BC Managed Care – PPO | Source: Ambulatory Visit | Attending: Obstetrics and Gynecology | Admitting: Obstetrics and Gynecology

## 2010-08-01 DIAGNOSIS — R922 Inconclusive mammogram: Secondary | ICD-10-CM

## 2010-08-19 ENCOUNTER — Emergency Department (INDEPENDENT_AMBULATORY_CARE_PROVIDER_SITE_OTHER): Payer: BC Managed Care – PPO

## 2010-08-19 ENCOUNTER — Encounter: Payer: Self-pay | Admitting: *Deleted

## 2010-08-19 ENCOUNTER — Emergency Department (HOSPITAL_BASED_OUTPATIENT_CLINIC_OR_DEPARTMENT_OTHER)
Admission: EM | Admit: 2010-08-19 | Discharge: 2010-08-19 | Disposition: A | Discharge status: Home / Self Care | Payer: BC Managed Care – PPO | Attending: Emergency Medicine | Admitting: Emergency Medicine

## 2010-08-19 DIAGNOSIS — S5000XA Contusion of unspecified elbow, initial encounter: Secondary | ICD-10-CM

## 2010-08-19 DIAGNOSIS — Y92009 Unspecified place in unspecified non-institutional (private) residence as the place of occurrence of the external cause: Secondary | ICD-10-CM | POA: Insufficient documentation

## 2010-08-19 DIAGNOSIS — W19XXXA Unspecified fall, initial encounter: Secondary | ICD-10-CM

## 2010-08-19 DIAGNOSIS — F341 Dysthymic disorder: Secondary | ICD-10-CM | POA: Insufficient documentation

## 2010-08-19 DIAGNOSIS — I10 Essential (primary) hypertension: Secondary | ICD-10-CM | POA: Insufficient documentation

## 2010-08-19 HISTORY — DX: Essential (primary) hypertension: I10

## 2010-08-19 HISTORY — DX: Anxiety disorder, unspecified: F41.9

## 2010-08-19 HISTORY — DX: Depression, unspecified: F32.A

## 2010-08-19 HISTORY — DX: Major depressive disorder, single episode, unspecified: F32.9

## 2010-08-19 NOTE — ED Notes (Signed)
Tripped and fell 2 weeks ago. Inj to her left elbow. Swollen and bruised.

## 2010-08-19 NOTE — ED Provider Notes (Signed)
History     CSN: [id number] Arrival date & time: 08/19/2010  4:13 PM  Chief Complaint  Patient presents with  . Elbow Injury   Patient is a 65 y.o. female presenting with arm injury. The history is provided by the patient.  Arm Injury  The incident occurred more than 2 days ago. The incident occurred at home. The injury mechanism was a fall. The context of the injury is unknown. The wounds were not self-inflicted. No protective equipment was used. There is an injury to the left elbow. The pain is moderate. It is unlikely that a foreign body is present. There have been no prior injuries to these areas. Her tetanus status is UTD. There were no sick contacts.    Past Medical History  Diagnosis Date  . Hypertension   . Arthritis   . Anxiety   . Depression     History reviewed. No pertinent past surgical history.  No family history on file.  History  Substance Use Topics  . Smoking status: Never Smoker   . Smokeless tobacco: Not on file  . Alcohol Use: No    OB History    Grav Para Term Preterm Abortions TAB SAB Ect Mult Living                  Review of Systems  Musculoskeletal: Positive for joint swelling.  Skin: Positive for color change.  All other systems reviewed and are negative.    Physical Exam  BP 147/81  Pulse 105  Temp(Src) 99.5 F (37.5 C) (Oral)  Resp 22  SpO2 98%  Physical Exam  Constitutional: She appears well-developed and well-nourished.  HENT:  Head: Normocephalic.  Eyes: Pupils are equal, round, and reactive to light.  Neck: Normal range of motion.  Musculoskeletal: She exhibits edema and tenderness.  Neurological: She is alert.  Skin: There is erythema.  Psychiatric: She has a normal mood and affect.  pt has a hematoma below elbow,  Tender to palpation,  From,  nv and ns intact  ED Course  Procedures  MDM No fx.  I counseled pt and advised wound care followup with Dr. Pearletha Forge if pain persist      Langston Masker, [place] 08/19/10  1822  Medical screening examination/treatment/procedure(s) were conducted as a shared visit with non-physician practitioner(s) and myself.  I personally evaluated the patient during the encounter   Sunnie Nielsen, MD 08/25/10 [phone]

## 2010-08-29 ENCOUNTER — Encounter: Payer: Self-pay | Admitting: Internal Medicine

## 2010-08-29 ENCOUNTER — Ambulatory Visit (INDEPENDENT_AMBULATORY_CARE_PROVIDER_SITE_OTHER): Payer: BC Managed Care – PPO | Admitting: Internal Medicine

## 2010-08-29 DIAGNOSIS — M1710 Unilateral primary osteoarthritis, unspecified knee: Secondary | ICD-10-CM

## 2010-08-29 DIAGNOSIS — T148XXA Other injury of unspecified body region, initial encounter: Secondary | ICD-10-CM

## 2010-08-29 DIAGNOSIS — D649 Anemia, unspecified: Secondary | ICD-10-CM

## 2010-08-29 DIAGNOSIS — M069 Rheumatoid arthritis, unspecified: Secondary | ICD-10-CM

## 2010-08-29 DIAGNOSIS — IMO0002 Reserved for concepts with insufficient information to code with codable children: Secondary | ICD-10-CM

## 2010-08-29 DIAGNOSIS — M1711 Unilateral primary osteoarthritis, right knee: Secondary | ICD-10-CM | POA: Insufficient documentation

## 2010-08-29 DIAGNOSIS — I10 Essential (primary) hypertension: Secondary | ICD-10-CM

## 2010-08-29 NOTE — Patient Instructions (Signed)
Warm compresses 3 times a day to the left elbow. Discuss the hematoma with the physician's assistant at Dr. Nilsa Nutting office. Please share these records with that office. You're  medically cleared for surgery.

## 2010-08-29 NOTE — Progress Notes (Signed)
Subjective:    Patient ID: Haley Roberts, female    DOB: May 11, 1945, 65 y.o.   MRN: [id number]  HPI Pre op evaluation: Surgical diagnosis:DJD of knee & RA; "bone on bone" Tentative surgical date/Surgeon:09/13/2010,Dr Charlann Boxer: R TKR Severity:up to 10  Quality: throbbing ; Activity of daily living limitation/impairment of function:severely compromised Treatment to date, efficacy:hydrodone , intra-articular  steroids w/o benefit Significant past medical history:no diabetes (but hyperglycemia); dyslipidemia;  coronary disease; cancer     Review of Systems  CHRONIC HYPERTENSION: Disease Monitoring  Blood pressure range: not monitored @ home but excellent @ MD appts  Chest pain: no   Dyspnea: no   Claudication: no  Medication compliance: no  Medication Side Effects  Lightheadedness: no   Urinary frequency: no   Edema: yes, intermittently with heat or prolonged walking  Preventitive Healthcare:  Exercise: no, due to DJD   Diet Pattern: no plan  Salt Restriction: modified  Degenerative disc disease of  lumbosacral spine has been diagnosed as the cause of her neurologic symptoms (numbness)  in the right foot. An MRI is planned after she recovers from the total knee replacement  .  She has had chronic mild anemia for at least 4 years. Her hematocrit was 34.4 in May of 2008 and 34.8 in December 2010. Iron levels were slightly decreased at 34 in March. Copies of labs 2008- 2012 were provided for her to share with Dr. Nilsa Nutting office.  She plans to schedule a colonoscopy by Winter 2013.  She tripped several weeks ago and injured her left elbow. This was diagnosed as a hematoma at the urgent care. It is draining intermittently. The material is dark and serous by history.     Objective:   Physical Exam Gen.: Healthy and well-nourished in appearance. Alert, appropriate and cooperative throughout exam. Head: Normocephalic without obvious abnormalities Eyes: No corneal or conjunctival  inflammation noted.  Neck: No deformities, masses, or tenderness noted. Thyroid full & firm. Lungs: Normal respiratory effort; chest expands symmetrically. Lungs are clear to auscultation without rales, wheezes, or increased work of breathing. Heart: Normal rate and rhythm. Normal S1 and S2. No gallop, click, or rub. Grade 1/2-1 R base systolic  murmur. Abdomen: Bowel sounds normal; abdomen soft and nontender. No masses, organomegaly or hernias noted.                                                                                   Musculoskeletal/extremities: No deformity or scoliosis noted of  the thoracic or lumbar spine. No clubbing, cyanosis, edema.Joints:mixed arthritic hand changes. Nails thickened L foot. She has fusiform enlargement of both knees, right greater than left. Right effusion is present. She has markedly decreased range of motion of the knees, again greater on the right. Crepitus is present. She has a firm subcutaneous resolving hematoma at the left elbow. Vascular: Carotid, radial artery, dorsalis pedis and  posterior tibial pulses are full and equal. No bruits present. Neurologic: Alert and oriented x3. Deep tendon reflexes symmetrical and normal.          Skin: Intact without suspicious lesions or rashes. Lymph: No cervical, axillary lymphadenopathy present. Psych: Mood and affect are normal. Normally interactive  Assessment & Plan:  #1 end-stage mixed arthritis of the knees with intractable pain and profound lifestyle limitations  #2 hypertension well controlled  #3 anemia, mild stable  Plan: EKG: Left axis deviation; no ischemic or hypertensive changes   Medically cleared for a total knee replacement.

## 2010-09-05 ENCOUNTER — Encounter (HOSPITAL_COMMUNITY): Payer: BC Managed Care – PPO

## 2010-09-05 ENCOUNTER — Other Ambulatory Visit: Payer: Self-pay | Admitting: Orthopedic Surgery

## 2010-09-05 ENCOUNTER — Ambulatory Visit (HOSPITAL_COMMUNITY)
Admission: RE | Admit: 2010-09-05 | Discharge: 2010-09-05 | Disposition: A | Discharge status: Home / Self Care | Payer: BC Managed Care – PPO | Source: Ambulatory Visit | Attending: Orthopedic Surgery | Admitting: Orthopedic Surgery

## 2010-09-05 ENCOUNTER — Other Ambulatory Visit (HOSPITAL_COMMUNITY): Payer: Self-pay | Admitting: Orthopedic Surgery

## 2010-09-05 DIAGNOSIS — Z01818 Encounter for other preprocedural examination: Secondary | ICD-10-CM | POA: Insufficient documentation

## 2010-09-05 DIAGNOSIS — M1710 Unilateral primary osteoarthritis, unspecified knee: Secondary | ICD-10-CM | POA: Insufficient documentation

## 2010-09-05 DIAGNOSIS — Z01811 Encounter for preprocedural respiratory examination: Secondary | ICD-10-CM | POA: Insufficient documentation

## 2010-09-05 DIAGNOSIS — I10 Essential (primary) hypertension: Secondary | ICD-10-CM | POA: Insufficient documentation

## 2010-09-05 DIAGNOSIS — Z01812 Encounter for preprocedural laboratory examination: Secondary | ICD-10-CM | POA: Insufficient documentation

## 2010-09-05 LAB — BASIC METABOLIC PANEL
BUN: 17 mg/dL (ref 6–23)
CO2: 28 mEq/L (ref 19–32)
Chloride: 102 mEq/L (ref 96–112)
GFR calc non Af Amer: >60 mL/min (ref >60)
Glucose, Bld: 88 mg/dL (ref 70–99)
Potassium: 4.1 mEq/L (ref 3.5–5.1)

## 2010-09-05 LAB — CBC
HCT: 33.2 % — ABNORMAL LOW (ref 36.0–46.0)
MCH: 29 pg (ref 26.0–34.0)
MCHC: 31.3 g/dL (ref 30.0–36.0)
RDW: 16 % — ABNORMAL HIGH (ref 11.5–15.5)

## 2010-09-05 LAB — PROTIME-INR: INR: 1.05 (ref 0.00–1.49)

## 2010-09-05 LAB — URINALYSIS, ROUTINE W REFLEX MICROSCOPIC
Bilirubin Urine: NEGATIVE
Glucose, UA: NEGATIVE mg/dL
Hgb urine dipstick: NEGATIVE
Specific Gravity, Urine: 1.031 — ABNORMAL HIGH (ref 1.005–1.030)
Urobilinogen, UA: 0.2 mg/dL (ref 0.0–1.0)

## 2010-09-05 LAB — DIFFERENTIAL
Basophils Absolute: 0 10*3/uL (ref 0.0–0.1)
Basophils Relative: 1 % (ref 0–1)
Eosinophils Relative: 12 % — ABNORMAL HIGH (ref 0–5)
Monocytes Absolute: 0.6 10*3/uL (ref 0.1–1.0)

## 2010-09-05 LAB — SURGICAL PCR SCREEN: Staphylococcus aureus: POSITIVE — AB

## 2010-09-09 ENCOUNTER — Other Ambulatory Visit: Payer: Self-pay | Admitting: Orthopedic Surgery

## 2010-09-09 DIAGNOSIS — M549 Dorsalgia, unspecified: Secondary | ICD-10-CM

## 2010-09-12 ENCOUNTER — Ambulatory Visit
Admission: RE | Admit: 2010-09-12 | Discharge: 2010-09-12 | Disposition: A | Discharge status: Home / Self Care | Payer: BC Managed Care – PPO | Source: Ambulatory Visit | Attending: Orthopedic Surgery | Admitting: Orthopedic Surgery

## 2010-09-12 DIAGNOSIS — M549 Dorsalgia, unspecified: Secondary | ICD-10-CM

## 2010-09-12 MED ORDER — METHYLPREDNISOLONE ACETATE 40 MG/ML INJ SUSP (RADIOLOG
120.0000 mg | Freq: Once | INTRAMUSCULAR | Status: AC
  Administered 2010-09-12: 120 mg via EPIDURAL

## 2010-09-12 MED ORDER — IOHEXOL 180 MG/ML  SOLN
1.0000 mL | Freq: Once | INTRAMUSCULAR | Status: AC | PRN
  Administered 2010-09-12: 1 mL via EPIDURAL

## 2010-09-12 NOTE — H&P (Addendum)
Haley Roberts, Haley Roberts             ACCOUNT NO.:  [id number]  MEDICAL RECORD NO.:  [id number]  LOCATION:  [facility]08                          FACILITY:  [facility]  PHYSICIAN:  Madlyn Frankel. Charlann Boxer, M.D.  DATE OF BIRTH:  05/22/1945  DATE OF ADMISSION:  08/19/2010 DATE OF DISCHARGE:  08/19/2010                             HISTORY & PHYSICAL   DATE OF SURGERY:  September 13, 2010  ADMISSION DIAGNOSIS:  Ostial and rheumatoid arthritis, right knee.  HISTORY OF PRESENT ILLNESS:  This 62-year lady with history of rheumatoid arthritis with degenerative change of her right knee with failure of conservative treatment to alleviate her symptoms.  After discussion of treatment, benefits, risks and options, the patient is now scheduled for total knee arthroplasty of the right knee.  Note that the patient is a candidate for Tranexamic acid, and we will receive that preop.  She is given her home medications of aspirin, Robaxin, iron, MiraLax, and Colace to take postoperatively.  Her medical doctor is Dr. Alwyn Ren and her rheumatologist is Dr. Dareen Piano.  Note that she does take Remicade and methotrexate, and has been advised to stop those 2 weeks before surgery and 2 weeks after surgery.  PAST MEDICAL HISTORY:  Drug allergies to SULFA with fevers and a rash.  CURRENT MEDICATIONS: 1. Desipramine 75 mg once a day. 2. Clorazepate 3.75 mg, 2-3 per day p.r.n. 3. Lexapro 10 mg once a day. 4. Benazepril 20 mg once a day. 5. Naprosyn 500 mg once a day. 6. Methotrexate 0.6 mg injection once a week. 7. Folic acid 2 pills a day. 8. Vivelle estrogen patch once a week or 1 patch twice a week. 9. Remicade IV once every 7 weeks. 10.Vitamins.  SERIOUS MEDICAL ILLNESSES:  Include rheumatoid arthritis, hypertension, and depression.  PREVIOUS SURGERIES:  Include breast lump removal and hysterectomy.  FAMILY HISTORY:  Positive for cancer, hypertension, and coronary artery disease.  SOCIAL HISTORY:  The patient is married.   She does not smoke or drink. She lives at home and will be going home after surgery.  REVIEW OF SYSTEMS:  CENTRAL NERVOUS SYSTEM:  Negative for headache, blurred vision, or dizziness.  PULMONARY:  Negative shortness of breath, PND, or orthopnea.  CARDIOVASCULAR:  Positive for hypertension. Negative for chest pain or palpitation.  GI:  Negative for ulcers or hepatitis.  GU:  Negative for urinary tract difficulties. MUSCULOSKELETAL:  Positive in HPI.  PHYSICAL EXAMINATION:  VITAL SIGNS:  BP 140/92, respirations 16, pulse 80 and regular. GENERAL APPEARANCE:  This is a well-developed and well-nourished lady in no acute distress. HEENT:  Head normocephalic.  Nose patent.  Ears patent.  Pupils equal, round, and reactive to light.  Throat without injection. NECK:  Supple without adenopathy.  Carotids 2+ without bruit. CHEST:  Clear to auscultation.  No rales or rhonchi.  Respirations 60. HEART:  Regular rate and rhythm at 80 beats per minute without murmur. ABDOMEN:  Soft.  Active bowel sounds.  No masses or organomegaly. NEUROLOGIC:  The patient is alert and oriented to time, place, and person.  Cranial nerves II through XII grossly intact. EXTREMITY:  Shows right knee with 2 degrees flexion contracture, further flexion to 124 degrees.  Neurovascular status intact.  IMPRESSION:  Osteoarthritis, right knee.  PLAN:  Total knee arthroplasty, right knee.     [name], P.A.   ______________________________ Madlyn Frankel Charlann Boxer, M.D.    SJC/MEDQ  D:  08/31/2010  T:  09/01/2010  Job:  [id number]  Electronically Signed by Jodene Nam P.A. on 10/05/2010 02:18:57 PM Electronically Signed by Durene Romans M.D. on 10/06/2010 08:38:57 PM

## 2010-09-13 ENCOUNTER — Inpatient Hospital Stay (HOSPITAL_COMMUNITY)
Admission: RE | Admit: 2010-09-13 | Discharge: 2010-09-15 | DRG: 209 | Disposition: A | Discharge status: Home Health | Payer: BC Managed Care – PPO | Source: Ambulatory Visit | Attending: Orthopedic Surgery | Admitting: Orthopedic Surgery

## 2010-09-13 DIAGNOSIS — Z01812 Encounter for preprocedural laboratory examination: Secondary | ICD-10-CM

## 2010-09-13 DIAGNOSIS — M1710 Unilateral primary osteoarthritis, unspecified knee: Secondary | ICD-10-CM | POA: Diagnosis present

## 2010-09-13 DIAGNOSIS — M069 Rheumatoid arthritis, unspecified: Principal | ICD-10-CM | POA: Diagnosis present

## 2010-09-13 DIAGNOSIS — I10 Essential (primary) hypertension: Secondary | ICD-10-CM | POA: Diagnosis present

## 2010-09-13 DIAGNOSIS — F341 Dysthymic disorder: Secondary | ICD-10-CM | POA: Diagnosis present

## 2010-09-13 LAB — TYPE AND SCREEN
ABO/RH(D): O POS
Antibody Screen: NEGATIVE

## 2010-09-13 LAB — ABO/RH: ABO/RH(D): O POS

## 2010-09-14 LAB — BASIC METABOLIC PANEL
Chloride: 104 mEq/L (ref 96–112)
Potassium: 3.8 mEq/L (ref 3.5–5.1)
Sodium: 136 mEq/L (ref 135–145)

## 2010-09-14 LAB — CBC
MCHC: 31.1 g/dL (ref 30.0–36.0)
Platelets: 438 10*3/uL — ABNORMAL HIGH (ref 150–400)
RDW: 15.3 % (ref 11.5–15.5)
WBC: 7.1 10*3/uL (ref 4.0–10.5)

## 2010-09-15 LAB — CBC
HCT: 29.3 % — ABNORMAL LOW (ref 36.0–46.0)
MCV: 91 fL (ref 78.0–100.0)
Platelets: 461 10*3/uL — ABNORMAL HIGH (ref 150–400)
RBC: 3.22 MIL/uL — ABNORMAL LOW (ref 3.87–5.11)
RDW: 15.2 % (ref 11.5–15.5)
WBC: 8.2 10*3/uL (ref 4.0–10.5)

## 2010-09-15 LAB — BASIC METABOLIC PANEL
CO2: 29 mEq/L (ref 19–32)
Chloride: 102 mEq/L (ref 96–112)
Creatinine, Ser: 0.54 mg/dL (ref 0.50–1.10)
GFR calc Af Amer: >60 mL/min (ref >60)
Sodium: 137 mEq/L (ref 135–145)

## 2010-09-16 NOTE — Discharge Summary (Signed)
NAMESHARIFAH, [name]             ACCOUNT NO.:  [id number]  MEDICAL RECORD NO.:  [id number]  LOCATION:  1608                         FACILITY:  [facility]  PHYSICIAN:  Madlyn Frankel. Charlann Boxer, M.D.  DATE OF BIRTH:  May 29, 1945  DATE OF ADMISSION:  09/13/2010 DATE OF DISCHARGE:  09/15/2010                              DISCHARGE SUMMARY   PROCEDURES:  Right total knee arthroplasty.  ATTENDING PHYSICIAN:  Madlyn Frankel. Charlann Boxer, M.D.  ADMITTING DIAGNOSIS:  Osteo and rheumatoid arthritis of the right knee.  DISCHARGE DIAGNOSES: 1. Status post right total knee arthroplasty. 2. Hypertension. 3. Anxiety/depression. 4. Rheumatoid arthritis.  HISTORY OF PRESENT ILLNESS:  The patient is a 65 year old lady with a history of rheumatoid arthritis with degenerative changes of her right knee.  The patient has tried several conservative treatments, all of which have failed to alleviate her symptoms.  X-rays in the clinic do show degenerative changes of the right knee.  Various options were discussed with the patient.  The patient wishes to proceed with surgery. Risks, benefits, and expectations of procedure were discussed with the patient.  The patient understands risks, benefits, and expectations and wishes to proceed with surgery.  HOSPITAL COURSE:  The patient underwent the above-stated procedure on September 13, 2010.  The patient tolerated the procedure well, was brought to the recovery room in good condition and subsequently to the floor.  Postop day #1, September 14, 2010, the patient did well, no events.  The patient states the pain was well under control.  Afebrile.  Vital signs stable.  H and H, 8.8/28.3.  Dressing was good, clean, dry, and intact. Distally neurovascular intact.  The patient had physical therapy.  Postop day #2, September 15, 2010, the patient doing well, no events.  The patient again states the pain is well-controlled and she is ready to go home.  Afebrile. Vital signs stable.  H and H,  9.3/29.3.  Dressing is good, clean, dry, and intact.  She is distally neurovascularly intact. She is doing well with physical therapy as well the patient was doing well enough to be discharged home.  DISCHARGE CONDITION:  Good.  DISCHARGE INSTRUCTIONS:  Apparently, the patient will be discharged home, September 15, 2010, with home health PT after having physical therapy in the hospital.  The patient would be weightbearing as tolerated.  The patient should maintain surgical dressing for 7-8 days after which time she can change the gauze and tape.  The patient should keep the area dry and clean until followup.  The patient to follow up Dr. Charlann Boxer at the [facility] in 2 weeks.  The patient is to call with any concerns or questions.  DISCHARGE MEDICATIONS: 1. Aspirin enteric-coated 325 mg one p.o. b.i.d. x4 weeks. 2. Benadryl 25 mg one p.o. q.4 hours p.r.n. 3. Colace 100 mg one p.o. b.i.d. p.r.n. constipation. 4. Iron sulfate 325 mg one p.o. t.i.d. x2-3 weeks. 5. Norco 7.5/325 one to two p.o. q.4-6 hours p.r.n. pain. 6. Robaxin 500 mg one p.o. q.6 hours p.r.n. muscle spasms. 7. MiraLax 17 g p.o. b.i.d. constipation. 8. Benazepril 20 mg one p.o. q.h.s. 9. Calcium carbonate/vitamin D one p.o. b.i.d. 10.Clorazepate 3.75 mg one p.o. t.i.d. p.r.n. anxiety.  11.Disopyramide 75 mg one p.o. q.h.s. 12.Folic acid 1 mg two p.o. daily. 13.Lispro 10 mg one p.o. q.h.s. 14.Methotrexate one injection subcu q.week.  The patient instructed to     be in office 2 weeks before or 2 weeks after surgery. 15.Multivitamin 1 p.o. daily. 16.Remicade one injection every 6 weeks.  The patient is to be in     office 2 weeks before and 2 weeks after the surgery. 17.Avodart 0.025 mg one patch transdermally on Mondays and Thursdays.    ______________________________ Haley Gins, PA   ______________________________ Madlyn Frankel. Charlann Boxer, M.D.    MB/MEDQ  D:  09/15/2010  T:  09/15/2010  Job:   [id number]  Electronically Signed by Haley Gins PA on 09/16/2010 08:50:00 AM Electronically Signed by Durene Romans M.D. on 09/16/2010 09:02:20 AM

## 2010-09-16 NOTE — Op Note (Signed)
Haley Roberts, Haley Roberts             ACCOUNT NO.:  [id number]  MEDICAL RECORD NO.:  [id number]  LOCATION:  1608                         FACILITY:  [facility]  PHYSICIAN:  Madlyn Frankel. Charlann Boxer, M.D.  DATE OF BIRTH:  03/27/1945  DATE OF PROCEDURE:  09/13/2010 DATE OF DISCHARGE:                              OPERATIVE REPORT   PREOPERATIVE DIAGNOSIS:  Right knee osteoarthritis.  POSTOPERATIVE DIAGNOSIS:  Right knee osteoarthritis.  PROCEDURE:  Right total knee replacement.  COMPONENTS USED:  DePuy rotating platform posterior stabilized knee system, size 2.5 femur, 2.5 tibia, 12.5 insert to match 2.5 femur, and 38 patellar button.  SURGEON:  Madlyn Frankel. Charlann Boxer, MD  ASSISTANT:  Jodene Nam, PA-C  ANESTHESIA:  Spinal.  SPECIMENS:  None.  COMPLICATIONS:  None.  DRAINS:  1 Hemovac.  TOURNIQUET TIME:  35 minutes at 250 mmHg.  INDICATIONS FOR PROCEDURE:  Ms. Mcgloin is a 65 year old female who presented to the office for evaluation of right knee pain.  She had had progressive and worsening problems with her right knee over the years. She has been caring for her husband who has ALS.  Unfortunately, her pain has gotten beyond the point of what she can tolerate.  She needs to be more functional.  She has bone-on-bone radiographic findings.  She has failed conservative measures and is ready at this point to proceed with arthroplasty.  Risks of infection, particularly given her immunocompromised state regarding medications for rheumatoid arthritis, DVT, component failure, need for revision surgery, all were discussed and reviewed.  Consent was obtained for benefit of pain relief.  PROCEDURE IN DETAIL:  The patient was brought to the operative theater. Once adequate anesthesia and preoperative antibiotics were administered, she was positioned in supine.  With the right thigh tourniquet in place, the right lower extremity was then prepped and draped in a sterile fashion with the right foot  placed in the [facility] leg holder.  A time- out was performed identifying the patient, planned procedure and extremity.  The leg was exsanguinated, tourniquet elevated to 250 mmHg. A midline incision was made, followed by a median arthrotomy.  Following initial exposure including a synovectomy due to her underlying inflammatory condition, attention was first directed to patella.  Precut measurement was 22 to 23 mm.  I resected down to 14 mm, used a 38 patellar button to restore height.  Lug holes were drilled and a metal shim was placed to protect the patella from retractors and saw blades.  At this point, attention was directed to the femur.  Femoral canal was opened with a drill, irrigated to try to prevent fat emboli.  An intramedullary rod was passed at 3 degrees of valgus.  Based on osteophyte present, I resected 11 mm of bone.  Following this resection, the tibia was subluxated anteriorly. Following removal of cruciate and meniscus, the extramedullary guide was positioned.  She had significant medial wear with ridging of the remaining bone.  Measured resection of a little over 10 mm , which amounted close to a 2 mm cut medially was made.  I confirmed the cut was perpendicular in coronal plane #1 but then also checked that the gap was stable with a 10 mm  insert.  Following this resection, I sized the femur to be a size 2.5 in anterior and posterior dimension, and a size 2.5 rotation block was then pinned in position with anterior reference using a C clamp to set rotation.  The 4-in-1 cutting block was pinned into position.  Anterior-posterior chamfer cuts made without difficulty nor notching.  Final box cut was made off the lateral aspect of the distal femur. Following this resection, the tibia was subluxated anteriorly.  The cut surface was best fit with a size 2.5 tibial tray, was pinned into position, drilled and keel punched.  Trial reduction was now carried out with a 2.5  femur, 2.5 tibia, then a 12.5 insert.  The knee came to full extension, was stable from extension and flexion. The patella tracked through the trochlea without application of pressure.  Given these findings, the final components were opened. Cement was mixed.  The synovial capsule junction was injected with 0.25% Marcaine with epinephrine and 1 mL of Toradol.  The knee was irrigated with normal saline solution and pulse lavaged.  Final components were then cemented onto clean and dried prepared cut surfaces of bone, including drilling some of the sclerotic bone medially with a smooth pin.  The knee was brought to extension with a 12.5 insert and extruded cement was removed.  Once the cement had fully cured and excessive cement was removed throughout the knee, the final 12.5 insert was placed in the knee.  The tourniquet had been let down after 35 minutes without significant hemostasis required.  A medium Hemovac drain was placed deep.  The knee was irrigated with normal saline solution and pulse lavaged again.  Extensor mechanism reapproximated using #1 Vicryl with the knee in flexion.  The remainder of the wound was closed with 2-0 Vicryl, running 4-0 Monocryl.  The knee was cleaned, dried and dressed sterilely using Dermabond and Aquacel dressing.  Drain site dressed separately.  Knee wrapped in ACE wrap.  She was then brought to the recovery room in stable condition having tolerated the procedure well.     Madlyn Frankel Charlann Boxer, M.D.     MDO/MEDQ  D:  09/13/2010  T:  09/13/2010  Job:  [id number]  Electronically Signed by Durene Romans M.D. on 09/16/2010 09:02:16 AM

## 2010-10-25 ENCOUNTER — Encounter: Payer: Self-pay | Admitting: Internal Medicine

## 2010-10-25 ENCOUNTER — Emergency Department (HOSPITAL_COMMUNITY)
Admission: EM | Admit: 2010-10-25 | Discharge: 2010-10-26 | Disposition: A | Discharge status: Home / Self Care | Payer: BC Managed Care – PPO | Attending: Emergency Medicine | Admitting: Emergency Medicine

## 2010-10-25 ENCOUNTER — Emergency Department (HOSPITAL_COMMUNITY): Payer: BC Managed Care – PPO

## 2010-10-25 ENCOUNTER — Telehealth: Payer: Self-pay | Admitting: Internal Medicine

## 2010-10-25 ENCOUNTER — Ambulatory Visit (INDEPENDENT_AMBULATORY_CARE_PROVIDER_SITE_OTHER): Payer: BC Managed Care – PPO | Admitting: Internal Medicine

## 2010-10-25 DIAGNOSIS — M069 Rheumatoid arthritis, unspecified: Secondary | ICD-10-CM

## 2010-10-25 DIAGNOSIS — R0609 Other forms of dyspnea: Secondary | ICD-10-CM

## 2010-10-25 DIAGNOSIS — R059 Cough, unspecified: Secondary | ICD-10-CM

## 2010-10-25 DIAGNOSIS — D649 Anemia, unspecified: Secondary | ICD-10-CM | POA: Insufficient documentation

## 2010-10-25 DIAGNOSIS — R0600 Dyspnea, unspecified: Secondary | ICD-10-CM

## 2010-10-25 DIAGNOSIS — I10 Essential (primary) hypertension: Secondary | ICD-10-CM

## 2010-10-25 DIAGNOSIS — R05 Cough: Secondary | ICD-10-CM

## 2010-10-25 DIAGNOSIS — R0989 Other specified symptoms and signs involving the circulatory and respiratory systems: Secondary | ICD-10-CM | POA: Insufficient documentation

## 2010-10-25 DIAGNOSIS — Z96659 Presence of unspecified artificial knee joint: Secondary | ICD-10-CM

## 2010-10-25 DIAGNOSIS — Z79899 Other long term (current) drug therapy: Secondary | ICD-10-CM | POA: Insufficient documentation

## 2010-10-25 DIAGNOSIS — R0602 Shortness of breath: Secondary | ICD-10-CM | POA: Insufficient documentation

## 2010-10-25 LAB — DIFFERENTIAL
Basophils Absolute: 0.1 10*3/uL (ref 0.0–0.1)
Basophils Relative: 1 % (ref 0–1)
Monocytes Absolute: 0.6 10*3/uL (ref 0.1–1.0)
Neutro Abs: 4.1 10*3/uL (ref 1.7–7.7)
Neutrophils Relative %: 65 % (ref 43–77)

## 2010-10-25 LAB — BASIC METABOLIC PANEL
Chloride: 98 mEq/L (ref 96–112)
GFR calc Af Amer: >90 mL/min (ref >90)
GFR calc non Af Amer: >90 mL/min (ref >90)
Glucose, Bld: 119 mg/dL — ABNORMAL HIGH (ref 70–99)
Potassium: 3.8 mEq/L (ref 3.5–5.1)
Sodium: 134 mEq/L — ABNORMAL LOW (ref 135–145)

## 2010-10-25 LAB — CBC
Hemoglobin: 8.6 g/dL — ABNORMAL LOW (ref 12.0–15.0)
MCHC: 31.4 g/dL (ref 30.0–36.0)
RDW: 16.4 % — ABNORMAL HIGH (ref 11.5–15.5)
WBC: 6.2 10*3/uL (ref 4.0–10.5)

## 2010-10-25 LAB — CK TOTAL AND CKMB (NOT AT ARMC)
CK, MB: 2.2 ng/mL (ref 0.3–4.0)
Relative Index: INVALID (ref 0.0–2.5)

## 2010-10-25 MED ORDER — IOHEXOL 300 MG/ML  SOLN
80.0000 mL | Freq: Once | INTRAMUSCULAR | Status: AC | PRN
  Administered 2010-10-25: 80 mL via INTRAVENOUS

## 2010-10-25 MED ORDER — LOSARTAN POTASSIUM 100 MG PO TABS: 100.0000 mg | ORAL_TABLET | Freq: Every day | ORAL | Status: DC

## 2010-10-25 NOTE — Patient Instructions (Signed)
Order for x-rays entered into  the computer; these will be performed at  [facility] [address]. No appointment is necessary.  

## 2010-10-25 NOTE — Progress Notes (Signed)
  Subjective:    Patient ID: Haley Roberts, female    DOB: 1945-11-07, 65 y.o.   MRN: [id number]  HPI Cough & SOB Onset:DOE with stairs x 2 weeks;NP  cough 1 week Extrinsic symptoms:itchy eyes, sneezing:no but rhinitis occasionally  Infectious symptoms :fever, purulent secretions :no Chest symptoms: pleuritic pain, sputum production,PNDyspnea, hemoptysis,wheezing:no GI symptoms: Dyspepsia, reflux: occasional Occupational/environmental exposures:no Smoking:never ACE inhibitor:on Benazepril Treatment/efficacy:none Past medical history/family history pulmonary disease: no  Both her brother and sister had aortic valve replaced. There is no family history of myocardial infarction.    Review of Systems  She had total knee replacement on the right in August; she's had an excellent recovery. She denies pain or swelling in the right lower extremity.     Objective:   Physical Exam Gen.: Healthy and well-nourished in appearance. Alert, appropriate and cooperative throughout exam. Eyes: No corneal or conjunctival inflammation noted. Ears: External  ear exam reveals no significant lesions or deformities. Canals clear .TMs normal.  Nose: External nasal exam reveals no deformity or inflammation. Nasal mucosa are pink and moist. No lesions or exudates noted. Mouth: Oral mucosa and oropharynx reveal no lesions or exudates. Teeth in good repair. Neck: No deformities, masses, or tenderness noted. Minor LN on vs cervical spur. No NVD Lungs: Normal respiratory effort; chest expands symmetrically. Lungs are clear to auscultation without rales, wheezes, or increased work of breathing. Heart: Normal rate and rhythm. Normal S1 and S2. No gallop, click, or rub. S4 w/o  murmur. Abdomen: Bowel sounds normal; abdomen soft and nontender. No masses, organomegaly or hernias noted. No HJR                                                                                    Musculoskeletal/extremities: Lordosis noted  of  the thoracic  spine. No clubbing, cyanosis, edema  noted. Range of motion  normal .Tone & strength  Normal.Joints; DIP hand changes .Fusirm changes R knee; crepitus on L. Nail health  good. Vascular: Carotid, radial artery, dorsalis pedis and  posterior tibial pulses are full and equal. No bruits present.Homan's sign negative Neurologic: Alert and oriented x3. Deep tendon reflexes symmetrical and normal.          Skin: Intact without suspicious lesions or rashes. Lymph: No significant  cervical, axillary lymphadenopathy present. Psych: Mood and affect are normal. Normally interactive                                                                                         Assessment & Plan:  #1 dyspnea on exertion; R/O angina equivalent  #2 dry cough in the context of ACE inhibitor therapy  Plan: see orders and recommendations

## 2010-10-25 NOTE — Telephone Encounter (Signed)
Called by answering service for positive D-dimer of 3.6 Reviewed office note - sob, doe,.hemopytsis. Called patient and instructed her to proceed to [facility] for CT angio chest to r/o PE. Called [facility] ED spoke with charge nurse so that they know why the patient is coming.

## 2010-10-26 ENCOUNTER — Telehealth: Payer: Self-pay

## 2010-10-26 LAB — CBC WITH DIFFERENTIAL/PLATELET
Basophils Relative: 0.5 % (ref 0.0–3.0)
HCT: 29 % — ABNORMAL LOW (ref 36.0–46.0)
Hemoglobin: 9.3 g/dL — ABNORMAL LOW (ref 12.0–15.0)
Lymphocytes Relative: 29.5 % (ref 12.0–46.0)
Lymphs Abs: 2 10*3/uL (ref 0.7–4.0)
Monocytes Relative: 6.5 % (ref 3.0–12.0)
Neutro Abs: 3.9 10*3/uL (ref 1.4–7.7)
RBC: 3.38 Mil/uL — ABNORMAL LOW (ref 3.87–5.11)
RDW: 17.5 % — ABNORMAL HIGH (ref 11.5–14.6)

## 2010-10-26 NOTE — Telephone Encounter (Signed)
Message copied by Edgardo Roys on Wed Oct 26, 2010  5:31 PM ------      Message from: Pecola Lawless      Created: Wed Oct 26, 2010 10:03 AM       D-dimer is a screening test for blood clots in the lung (& also in the leg)..If Chest Xray is normal , I recommend CAT scan of lungs.

## 2010-10-26 NOTE — Telephone Encounter (Signed)
Dr.Hopper spoke with patient about results

## 2010-10-31 ENCOUNTER — Telehealth: Payer: Self-pay

## 2010-10-31 MED ORDER — PRAVASTATIN SODIUM 20 MG PO TABS: 20.0000 mg | ORAL_TABLET | Freq: Every day | ORAL | Status: DC

## 2010-10-31 NOTE — Telephone Encounter (Signed)
RX, labs and stool cards mailed to patient

## 2010-10-31 NOTE — Telephone Encounter (Signed)
Message copied by Edgardo Roys on Mon Oct 31, 2010  2:55 PM ------      Message from: Pecola Lawless      Created: Fri Oct 28, 2010  4:09 PM       Please send a prescription for Pravastatin 20 mg ; dispense 90, daily  qhs

## 2010-11-01 ENCOUNTER — Encounter: Payer: Self-pay | Admitting: Internal Medicine

## 2010-11-01 ENCOUNTER — Ambulatory Visit (INDEPENDENT_AMBULATORY_CARE_PROVIDER_SITE_OTHER): Payer: BC Managed Care – PPO | Admitting: Internal Medicine

## 2010-11-01 DIAGNOSIS — J209 Acute bronchitis, unspecified: Secondary | ICD-10-CM

## 2010-11-01 DIAGNOSIS — R0789 Other chest pain: Secondary | ICD-10-CM

## 2010-11-01 DIAGNOSIS — R071 Chest pain on breathing: Secondary | ICD-10-CM

## 2010-11-01 DIAGNOSIS — J069 Acute upper respiratory infection, unspecified: Secondary | ICD-10-CM

## 2010-11-01 MED ORDER — HYDROCODONE-HOMATROPINE 5-1.5 MG/5ML PO SYRP: 5.0000 mL | ORAL_SOLUTION | Freq: Four times a day (QID) | ORAL | Status: AC | PRN

## 2010-11-01 MED ORDER — AMOXICILLIN-POT CLAVULANATE 875-125 MG PO TABS: 1.0000 | ORAL_TABLET | Freq: Two times a day (BID) | ORAL | Status: AC

## 2010-11-01 NOTE — Progress Notes (Signed)
  Subjective:    Patient ID: Haley Roberts, female    DOB: 04-19-1945, 65 y.o.   MRN: [id number]  HPI Respiratory tract infection Onset/symptoms:10/10 as ST Exposures (illness/environmental/extrinsic):ER visit  exposures Progression of symptoms:to nasal congestion & cough Treatments/response:Mucinex OTC Present symptoms: Fever/chills/sweats:intermittently hot Frontal headache:no Facial pain:no Nasal purulence:green Sore throat:slightly Dental pain:no Lymphadenopathy:no Wheezing/shortness of breath:SOB with cough Cough/sputum/hemoptysis:NP Pleuritic pain:no but back pain & SS pain ; improved with hydrocodone Rxed post knee surgery Past medical history: Seasonal allergies: some/asthma:no Smoking history:never           Review of Systems     Objective:   Physical Exam General appearance is of good health and nourishment; no acute distress or increased work of breathing is present.She is uncomfortable  No  lymphadenopathy about the head, neck, or axilla noted.   Eyes: No conjunctival inflammation or lid edema is present.   Ears:  External ear exam shows no significant lesions or deformities.  Otoscopic examination reveals clear canals, tympanic membranes are intact bilaterally without bulging, retraction, inflammation or discharge.  Nose:  External nasal examination shows no deformity or inflammation. Nasal mucosa are pink and moist without lesions or exudates. No septal dislocation .No obstruction to airflow.   Oral exam: Dental hygiene is good; lips and gums are healthy appearing.There is no oropharyngeal erythema or exudate noted.    Heart:  Normal rate and regular rhythm. S1 and S2 normal without gallop, murmur, click, rub .S 4 Lungs:Chest clear to auscultation; no wheezes, rhonchi,rales ,or rubs present.No increased work of breathing.  Intermittent dry cough  Extremities:  No cyanosis, edema, or clubbing  noted    Skin: Warm & dry w/o jaundice or tenting.            Assessment & Plan:  #1 purulent nasal discharge  #2 chest wall discomfort secondary to paroxysmal coughing. Clinically no bronchospasm present  Plan: See orders and recommendations.

## 2010-11-01 NOTE — Patient Instructions (Signed)
.  Share results with Dr Dareen Piano. Plain Mucinex for thick secretions ;force NON dairy fluids for next 48 hrs. Use a Neti pot daily as needed for sinus congestion

## 2010-11-04 ENCOUNTER — Telehealth: Payer: Self-pay

## 2010-11-04 MED ORDER — PREDNISONE 10 MG PO TABS: 10.0000 mg | ORAL_TABLET | Freq: Three times a day (TID) | ORAL | Status: AC

## 2010-11-04 NOTE — Telephone Encounter (Signed)
Please verify no codeine allergy; Hydromet 1 teaspoon every 6 hours as needed (not listed on med list). The only other option would be  prednisone  10 mg 3 times a day with meals for 3-5 days.

## 2010-11-04 NOTE — Telephone Encounter (Signed)
Spoke w/ pt aware medication called to pharmacy.  

## 2010-11-04 NOTE — Telephone Encounter (Signed)
Spoke with patient's son Haley Roberts (also a patient) [phone] and he stated his mother's cough is worse and it is causing her to have pain. ? If Dr.Hopper has any additional recommendations

## 2011-01-26 ENCOUNTER — Other Ambulatory Visit: Payer: Self-pay | Admitting: Neurosurgery

## 2011-01-26 DIAGNOSIS — M5412 Radiculopathy, cervical region: Secondary | ICD-10-CM

## 2011-01-26 DIAGNOSIS — M542 Cervicalgia: Secondary | ICD-10-CM

## 2011-02-02 ENCOUNTER — Ambulatory Visit
Admission: RE | Admit: 2011-02-02 | Discharge: 2011-02-02 | Disposition: A | Discharge status: Home / Self Care | Payer: BC Managed Care – PPO | Source: Ambulatory Visit | Attending: Neurosurgery | Admitting: Neurosurgery

## 2011-02-02 DIAGNOSIS — M542 Cervicalgia: Secondary | ICD-10-CM

## 2011-02-02 DIAGNOSIS — M5412 Radiculopathy, cervical region: Secondary | ICD-10-CM

## 2011-04-24 ENCOUNTER — Other Ambulatory Visit: Payer: Self-pay | Admitting: Internal Medicine

## 2011-04-24 NOTE — Telephone Encounter (Signed)
Refill done.  

## 2011-09-28 ENCOUNTER — Other Ambulatory Visit: Payer: Self-pay | Admitting: Internal Medicine

## 2011-09-28 ENCOUNTER — Other Ambulatory Visit: Payer: Self-pay | Admitting: General Practice

## 2011-09-28 MED ORDER — LOSARTAN POTASSIUM 100 MG PO TABS: 100.0000 mg | ORAL_TABLET | Freq: Every day | ORAL | Status: DC

## 2011-11-27 ENCOUNTER — Other Ambulatory Visit: Payer: Self-pay | Admitting: Obstetrics and Gynecology

## 2011-11-27 DIAGNOSIS — Z1231 Encounter for screening mammogram for malignant neoplasm of breast: Secondary | ICD-10-CM

## 2012-01-05 ENCOUNTER — Ambulatory Visit
Admission: RE | Admit: 2012-01-05 | Discharge: 2012-01-05 | Disposition: A | Discharge status: Home / Self Care | Payer: BC Managed Care – PPO | Source: Ambulatory Visit | Attending: Obstetrics and Gynecology | Admitting: Obstetrics and Gynecology

## 2012-01-05 DIAGNOSIS — Z1231 Encounter for screening mammogram for malignant neoplasm of breast: Secondary | ICD-10-CM

## 2012-01-25 ENCOUNTER — Encounter: Payer: Self-pay | Admitting: Internal Medicine

## 2012-01-25 ENCOUNTER — Ambulatory Visit (INDEPENDENT_AMBULATORY_CARE_PROVIDER_SITE_OTHER): Payer: BC Managed Care – PPO | Admitting: Internal Medicine

## 2012-01-25 ENCOUNTER — Other Ambulatory Visit: Payer: Self-pay | Admitting: Internal Medicine

## 2012-01-25 VITALS — BP 116/72 | HR 75 | Temp 98.0°F | Resp 12 | Wt 143.2 lb

## 2012-01-25 DIAGNOSIS — M069 Rheumatoid arthritis, unspecified: Secondary | ICD-10-CM

## 2012-01-25 DIAGNOSIS — R946 Abnormal results of thyroid function studies: Secondary | ICD-10-CM

## 2012-01-25 DIAGNOSIS — E049 Nontoxic goiter, unspecified: Secondary | ICD-10-CM

## 2012-01-25 DIAGNOSIS — J209 Acute bronchitis, unspecified: Secondary | ICD-10-CM

## 2012-01-25 MED ORDER — AZITHROMYCIN 250 MG PO TABS: ORAL_TABLET | ORAL | Status: DC

## 2012-01-25 MED ORDER — HYDROCODONE-HOMATROPINE 5-1.5 MG/5ML PO SYRP: 5.0000 mL | ORAL_SOLUTION | Freq: Four times a day (QID) | ORAL | Status: DC | PRN

## 2012-01-25 NOTE — Addendum Note (Signed)
Addended byMarga Melnick F on: 01/25/2012 11:36 AM   Modules accepted: Orders, Level of Service

## 2012-01-25 NOTE — Progress Notes (Signed)
  Subjective:    Patient ID: Haley Roberts, female    DOB: 09-Jul-1945, 67 y.o.   MRN: [id number]  HPI The respiratory tract symptoms began  01/15/13 as chills & headcongestion followed by dry cough.Symptoms improved but recurrence last week as cough with  yellow  green sputum.  Significant associated symptoms included  nasal purulence.   Fever , chills and sweats not  present since late December       Treatment with  Delsym &Mucinex partially effective  There is no history of asthma. The patient had never smoked .  Significantly she is on immunosuppressive drugs for rheumatoid arthritis from Dr. Dareen Piano                  Review of Systems Symptoms not present included frontal headache, facial pain, dental pain, sore throat,earache , and otic discharge Cough was not associated with shortness of breath or wheezing Itchy , watery eyes & sneezing were not noted.       Objective:   Physical Exam  General appearance:good health ;well nourished; no acute distress or increased work of breathing is present.  No  lymphadenopathy about the head, neck, or axilla noted.  Eyes: No conjunctival inflammation or lid edema is present.  Ears:  External ear exam shows no significant lesions or deformities.  Otoscopic examination reveals clear canals, tympanic membranes are intact bilaterally without bulging, retraction, inflammation or discharge. Nose:  External nasal examination shows no deformity or inflammation. Nasal mucosa are pink and moist without lesions or exudates. No septal dislocation or deviation.No obstruction to airflow.  Oral exam: Dental hygiene is good; lips and gums are healthy appearing.There is no oropharyngeal erythema or exudate noted.  Neck:  No deformities,  masses, or tenderness noted.    Heart:  Normal rate and regular rhythm. S1 and S2 normal without gallop, murmur, click, rub or other extra sounds.  Lungs:Chest clear to auscultation; no wheezes,  rhonchi,rales ,or rubs present.No increased work of breathing.   Extremities:  No cyanosis, edema, or clubbing  noted . Mixed arthritic changes Skin: Warm & dry         Assessment & Plan:  #1 acute bronchitis w/o bronchospasm. Some URI symptoms Plan: See orders and recommendations

## 2012-01-25 NOTE — Patient Instructions (Addendum)
Plain Mucinex for thick secretions ;force NON dairy fluids . Use a Neti pot daily as needed for sinus congestion; going from open side to congested side . Nasal cleansing in the shower as discussed. Make sure that all residual soap is removed to prevent irritation.  Plain Allegra 160 daily as needed for itchy eyes & sneezing.  If you activate My Chart; the results can be released to you as soon as they populate from the lab. If you choose not to use this program; the labs have to be reviewed, copied & mailed   causing a delay in getting the results to you.

## 2012-01-25 NOTE — Progress Notes (Signed)
  Subjective:    Patient ID: Haley Roberts, female    DOB: 05-26-1945, 67 y.o.   MRN: [id number]  HPI    Review of Systems     Objective:   Physical Exam   No lid lag or proptosis Thyroid full & firm w/o nodules DTRs WNL        Assessment & Plan:  #1 goiter, multinodular, PMH of #2 abnormal TFTs Plan: See orders and recommendations

## 2012-01-26 ENCOUNTER — Encounter: Payer: Self-pay | Admitting: Internal Medicine

## 2012-01-26 ENCOUNTER — Encounter: Payer: Self-pay | Admitting: *Deleted

## 2012-01-31 ENCOUNTER — Other Ambulatory Visit: Payer: BC Managed Care – PPO

## 2012-02-01 ENCOUNTER — Other Ambulatory Visit: Payer: BC Managed Care – PPO

## 2012-02-07 ENCOUNTER — Other Ambulatory Visit: Payer: BC Managed Care – PPO

## 2012-02-21 ENCOUNTER — Ambulatory Visit
Admission: RE | Admit: 2012-02-21 | Discharge: 2012-02-21 | Disposition: A | Discharge status: Home / Self Care | Payer: BC Managed Care – PPO | Source: Ambulatory Visit | Attending: Internal Medicine | Admitting: Internal Medicine

## 2012-02-21 DIAGNOSIS — E049 Nontoxic goiter, unspecified: Secondary | ICD-10-CM

## 2012-03-02 ENCOUNTER — Other Ambulatory Visit: Payer: Self-pay

## 2012-03-04 ENCOUNTER — Encounter: Payer: Self-pay | Admitting: Internal Medicine

## 2012-03-04 MED ORDER — LOSARTAN POTASSIUM 100 MG PO TABS: 100.0000 mg | ORAL_TABLET | Freq: Every day | ORAL | Status: DC

## 2012-03-04 NOTE — Telephone Encounter (Signed)
Fax recd from [facility] Dr: Joana Reamer Losartan 100mg  tablets. Take 1 tablet by mouth daily. Qty 30. Last fill 01-29-12

## 2012-03-04 NOTE — Telephone Encounter (Signed)
Rx sent 

## 2012-03-05 NOTE — Telephone Encounter (Signed)
Felicia sent rx yesterday

## 2012-03-06 ENCOUNTER — Encounter: Payer: Self-pay | Admitting: Internal Medicine

## 2012-03-06 ENCOUNTER — Ambulatory Visit (INDEPENDENT_AMBULATORY_CARE_PROVIDER_SITE_OTHER): Payer: BC Managed Care – PPO | Admitting: Internal Medicine

## 2012-03-06 VITALS — BP 134/82 | HR 75 | Temp 99.0°F | Wt 139.0 lb

## 2012-03-06 DIAGNOSIS — J209 Acute bronchitis, unspecified: Secondary | ICD-10-CM

## 2012-03-06 MED ORDER — AZITHROMYCIN 250 MG PO TABS: ORAL_TABLET | ORAL | Status: DC

## 2012-03-06 NOTE — Patient Instructions (Addendum)
Rest, fluids , tylenol For cough, take Mucinex DM twice a day as needed Take the antibiotic as prescribed  (zpack) Call if no better in few days Call anytime if the symptoms are severe

## 2012-03-06 NOTE — Progress Notes (Signed)
  Subjective:    Patient ID: Haley Roberts, female    DOB: 01-Apr-1945, 67 y.o.   MRN: [id number]  HPI Acute visit 4 days history of dry cough, nasal congestion and sore throat. Her husband has similar symptoms for the last day.  Past Medical History  Diagnosis Date  . Hypertension   . Rheumatoid arthritis   . Anxiety   . Depression   . Anemia   . Hyperglycemia    Past Surgical History  Procedure Laterality Date  . Abdominal hysterectomy    . Breast biopsy      Review of Systems No fever chills No nausea, vomiting, diarrhea. Muscle aches at baseline, she has rheumatoid arthritis on immunosuppressants No difficulty breathing.     Objective:   Physical Exam General -- alert, well-developed  HEENT-- nose is slightly congested, tympanic membranes normal, throat without redness. Face symmetric and not tender to palpation. Lungs -- normal respiratory effort, no intercostal retractions, no accessory muscle use, and few ronchi w/ cough Heart-- normal rate, regular rhythm, no murmur, and no gallop.    Neurologic-- alert & oriented X3 and strength normal in all extremities. Psych-- Cognition and judgment appear intact. Alert and cooperative with normal attention span and concentration.  not anxious appearing and not depressed appearing.       Assessment & Plan:   Bronchitis, 67 year old lady with symptoms consistent with bronchitis, she is immunosuppressed due to medications. See instructions.

## 2012-04-21 ENCOUNTER — Other Ambulatory Visit: Payer: Self-pay | Admitting: Internal Medicine

## 2012-10-11 ENCOUNTER — Other Ambulatory Visit: Payer: Self-pay | Admitting: Internal Medicine

## 2012-10-11 NOTE — Telephone Encounter (Signed)
Med filled. Pt sent a letter to make an appt.

## 2012-11-09 ENCOUNTER — Other Ambulatory Visit: Payer: Self-pay | Admitting: Internal Medicine

## 2012-11-11 ENCOUNTER — Other Ambulatory Visit: Payer: Self-pay | Admitting: *Deleted

## 2012-11-11 MED ORDER — LOSARTAN POTASSIUM 100 MG PO TABS: ORAL_TABLET | ORAL | Status: DC

## 2012-11-11 NOTE — Telephone Encounter (Signed)
Losartan refill sent to pharmacy. OV due 

## 2012-11-21 ENCOUNTER — Other Ambulatory Visit: Payer: Self-pay

## 2012-12-15 ENCOUNTER — Other Ambulatory Visit: Payer: Self-pay | Admitting: Internal Medicine

## 2012-12-16 NOTE — Telephone Encounter (Signed)
Losartan refilled per protocol 

## 2013-01-22 DIAGNOSIS — M1990 Unspecified osteoarthritis, unspecified site: Secondary | ICD-10-CM | POA: Diagnosis not present

## 2013-01-22 DIAGNOSIS — M069 Rheumatoid arthritis, unspecified: Secondary | ICD-10-CM | POA: Diagnosis not present

## 2013-01-22 DIAGNOSIS — IMO0001 Reserved for inherently not codable concepts without codable children: Secondary | ICD-10-CM | POA: Diagnosis not present

## 2013-01-22 DIAGNOSIS — Z23 Encounter for immunization: Secondary | ICD-10-CM | POA: Diagnosis not present

## 2013-01-22 DIAGNOSIS — M25569 Pain in unspecified knee: Secondary | ICD-10-CM | POA: Diagnosis not present

## 2013-01-28 ENCOUNTER — Other Ambulatory Visit: Payer: Self-pay | Admitting: Internal Medicine

## 2013-01-28 NOTE — Telephone Encounter (Signed)
Losartan refilled for one month. OV due. JG//CMA

## 2013-02-21 DIAGNOSIS — M1990 Unspecified osteoarthritis, unspecified site: Secondary | ICD-10-CM | POA: Diagnosis not present

## 2013-02-21 DIAGNOSIS — M069 Rheumatoid arthritis, unspecified: Secondary | ICD-10-CM | POA: Diagnosis not present

## 2013-02-21 DIAGNOSIS — IMO0001 Reserved for inherently not codable concepts without codable children: Secondary | ICD-10-CM | POA: Diagnosis not present

## 2013-02-25 ENCOUNTER — Other Ambulatory Visit: Payer: Self-pay | Admitting: Internal Medicine

## 2013-02-25 NOTE — Telephone Encounter (Signed)
Med filled #30 with 0.Pt needs OV for refills.

## 2013-03-18 DIAGNOSIS — M069 Rheumatoid arthritis, unspecified: Secondary | ICD-10-CM | POA: Diagnosis not present

## 2013-03-29 ENCOUNTER — Other Ambulatory Visit: Payer: Self-pay | Admitting: Internal Medicine

## 2013-04-15 DIAGNOSIS — M069 Rheumatoid arthritis, unspecified: Secondary | ICD-10-CM | POA: Diagnosis not present

## 2013-04-29 ENCOUNTER — Other Ambulatory Visit: Payer: Self-pay | Admitting: Internal Medicine

## 2013-05-13 ENCOUNTER — Ambulatory Visit (INDEPENDENT_AMBULATORY_CARE_PROVIDER_SITE_OTHER): Payer: Medicare Other | Admitting: Internal Medicine

## 2013-05-13 ENCOUNTER — Other Ambulatory Visit (INDEPENDENT_AMBULATORY_CARE_PROVIDER_SITE_OTHER): Payer: Medicare Other

## 2013-05-13 ENCOUNTER — Encounter: Payer: Self-pay | Admitting: Internal Medicine

## 2013-05-13 VITALS — BP 170/90 | HR 77 | Temp 98.5°F | Resp 14 | Wt 139.6 lb

## 2013-05-13 DIAGNOSIS — I10 Essential (primary) hypertension: Secondary | ICD-10-CM | POA: Diagnosis not present

## 2013-05-13 DIAGNOSIS — J019 Acute sinusitis, unspecified: Secondary | ICD-10-CM | POA: Diagnosis not present

## 2013-05-13 DIAGNOSIS — E049 Nontoxic goiter, unspecified: Secondary | ICD-10-CM

## 2013-05-13 LAB — CBC WITH DIFFERENTIAL/PLATELET
BASOS ABS: 0 10*3/uL (ref 0.0–0.1)
BASOS PCT: 0.3 % (ref 0.0–3.0)
Eosinophils Absolute: 0.1 10*3/uL (ref 0.0–0.7)
Eosinophils Relative: 1.2 % (ref 0.0–5.0)
HEMATOCRIT: 32.7 % — AB (ref 36.0–46.0)
HEMOGLOBIN: 10.9 g/dL — AB (ref 12.0–15.0)
LYMPHS PCT: 12.1 % (ref 12.0–46.0)
Lymphs Abs: 1.2 10*3/uL (ref 0.7–4.0)
MCHC: 33.4 g/dL (ref 30.0–36.0)
MCV: 92.7 fl (ref 78.0–100.0)
Monocytes Absolute: 0.9 10*3/uL (ref 0.1–1.0)
Monocytes Relative: 8.7 % (ref 3.0–12.0)
NEUTROS ABS: 7.8 10*3/uL — AB (ref 1.4–7.7)
Neutrophils Relative %: 77.7 % — ABNORMAL HIGH (ref 43.0–77.0)
Platelets: 476 10*3/uL — ABNORMAL HIGH (ref 150.0–400.0)
RBC: 3.53 Mil/uL — ABNORMAL LOW (ref 3.87–5.11)
RDW: 14.1 % (ref 11.5–14.6)
WBC: 10 10*3/uL (ref 4.5–10.5)

## 2013-05-13 LAB — BASIC METABOLIC PANEL
BUN: 10 mg/dL (ref 6–23)
CO2: 29 mEq/L (ref 19–32)
Calcium: 10 mg/dL (ref 8.4–10.5)
Chloride: 100 mEq/L (ref 96–112)
Creatinine, Ser: 0.8 mg/dL (ref 0.4–1.2)
GFR: 76.35 mL/min (ref >60.00)
Glucose, Bld: 89 mg/dL (ref 70–99)
Potassium: 4.7 mEq/L (ref 3.5–5.1)
Sodium: 138 mEq/L (ref 135–145)

## 2013-05-13 LAB — TSH: TSH: 2.14 u[IU]/mL (ref 0.35–5.50)

## 2013-05-13 MED ORDER — AMOXICILLIN 500 MG PO CAPS: 500.0000 mg | ORAL_CAPSULE | Freq: Three times a day (TID) | ORAL | Status: DC

## 2013-05-13 NOTE — Progress Notes (Signed)
Pre visit review using our clinic review tool, if applicable. No additional management support is needed unless otherwise documented below in the visit note. 

## 2013-05-13 NOTE — Patient Instructions (Signed)
Plain Mucinex (NOT D) for thick secretions ;force NON dairy fluids .   Nasal cleansing in the shower as discussed with lather of mild shampoo.After 10 seconds wash off lather while  exhaling through nostrils. Make sure that all residual soap is removed to prevent irritation.  Flonase OR Nasacort AQ 1 spray in each nostril twice a day as needed. Use the "crossover" technique into opposite nostril spraying toward opposite ear @ 45 degree angle, not straight up into nostril.  Use a Neti pot daily only  as needed for significant sinus congestion; going from open side to congested side . Plain Allegra (NOT D )  160 daily , Loratidine 10 mg , OR Zyrtec 10 mg @ bedtime  as needed for itchy eyes & sneezing.   Minimal Blood Pressure Goal= AVERAGE < 140/90;  Ideal is an AVERAGE < 135/85. This AVERAGE should be calculated from @ least 5-7 BP readings taken @ different times of day on different days of week. You should not respond to isolated BP readings , but rather the AVERAGE for that week .Please bring your  blood pressure cuff to office visits to verify that it is reliable.It  can also be checked against the blood pressure device at the pharmacy. Finger or wrist cuffs are not dependable; an arm cuff is. 

## 2013-05-13 NOTE — Progress Notes (Signed)
   Subjective:    Patient ID: Haley Roberts, female    DOB: 1945/11/15, 68 y.o.   MRN: [id number]  HPI   Her symptoms began one week ago with sore throat, rhinitis, head congestion, chest congestion, nonproductive cough, and itchy, watery eyes and sneezing. She also had some chills and low-grade fever over the weekend. Additionally she describes dental pain.  Her husband had possible  respiratory infection approximately 2 weeks ago.  She's using Mucinex with some response  At this time she has intermittent sore throat and hoarseness with persistent nonproductive cough. She has had some yellow nasal discharge.  The other symptoms noted above persist to some extent as well.    Review of Systems She is not having frontal sinus or maxillary sinus area pain. She denies otic discharge or pelvic pain. The cough is not associated with shortness of breath or wheezing. She is overdue for followup of her multinodular goiter. She receives methotrexate weekly for rheumatoid arthritis. She has not had labs since at least February. She describes a whitecoat syndrome with blood pressure elevations at [employer]. She states she has not been taking decongestants as an explanation for  the elevated blood pressure       Objective:   Physical Exam General appearance:good health ;well nourished; no acute distress or increased work of breathing is present.  No  lymphadenopathy about the head, neck, or axilla noted.   Eyes: No conjunctival inflammation or lid edema is present. There is no scleral icterus.  Ears:  External ear exam shows no significant lesions or deformities.  Otoscopic examination reveals clear canals, tympanic membranes are intact bilaterally without bulging, retraction, inflammation or discharge.  Nose:  External nasal examination shows no deformity or inflammation. Nasal mucosa are pink and moist without lesions or exudates. No septal dislocation or deviation.No obstruction to  airflow.   Oral exam: Dental hygiene is good; lips and gums are healthy appearing.There is no oropharyngeal erythema or exudate noted.   Neck:  No deformities,  masses, or tenderness noted.  Thyroid is irregular with nodular character. Supple with full range of motion without pain.   Heart:  Normal rate and regular rhythm. S1 and S2 normal without gallop, murmur, click, rub or other extra sounds.   Lungs:Chest clear to auscultation; no wheezes, rhonchi,rales ,or rubs present.No increased work of breathing.    Musculoskeletal/Extremities:  No cyanosis, edema, or clubbing  noted . Accentuated curvature of the thoracic spine.   Skin: Warm & dry w/o jaundice or tenting.         Assessment & Plan:   #1 sinusitis, acute  #2 elevated blood pressure history of "white coat syndrome"  #3 immunosuppressive therapy with methotrexate  #4 multinodular goiter, no recent followup  See orders

## 2013-05-21 ENCOUNTER — Other Ambulatory Visit: Payer: Medicare Other

## 2013-05-23 ENCOUNTER — Other Ambulatory Visit: Payer: Medicare Other

## 2013-05-30 ENCOUNTER — Ambulatory Visit
Admission: RE | Admit: 2013-05-30 | Discharge: 2013-05-30 | Disposition: A | Discharge status: Home / Self Care | Payer: Medicare Other | Source: Ambulatory Visit | Attending: Internal Medicine | Admitting: Internal Medicine

## 2013-05-30 ENCOUNTER — Other Ambulatory Visit: Payer: Self-pay | Admitting: Internal Medicine

## 2013-05-30 DIAGNOSIS — E042 Nontoxic multinodular goiter: Secondary | ICD-10-CM | POA: Diagnosis not present

## 2013-05-30 DIAGNOSIS — E041 Nontoxic single thyroid nodule: Secondary | ICD-10-CM | POA: Diagnosis not present

## 2013-05-30 DIAGNOSIS — E049 Nontoxic goiter, unspecified: Secondary | ICD-10-CM

## 2013-06-02 ENCOUNTER — Other Ambulatory Visit: Payer: Self-pay

## 2013-06-02 MED ORDER — LOSARTAN POTASSIUM 100 MG PO TABS: ORAL_TABLET | ORAL | Status: DC

## 2013-06-20 DIAGNOSIS — M069 Rheumatoid arthritis, unspecified: Secondary | ICD-10-CM | POA: Diagnosis not present

## 2013-06-27 DIAGNOSIS — M069 Rheumatoid arthritis, unspecified: Secondary | ICD-10-CM | POA: Diagnosis not present

## 2013-06-27 DIAGNOSIS — M79609 Pain in unspecified limb: Secondary | ICD-10-CM | POA: Diagnosis not present

## 2013-06-27 DIAGNOSIS — M1990 Unspecified osteoarthritis, unspecified site: Secondary | ICD-10-CM | POA: Diagnosis not present

## 2013-06-27 DIAGNOSIS — IMO0001 Reserved for inherently not codable concepts without codable children: Secondary | ICD-10-CM | POA: Diagnosis not present

## 2013-08-14 DIAGNOSIS — M069 Rheumatoid arthritis, unspecified: Secondary | ICD-10-CM | POA: Diagnosis not present

## 2013-08-25 ENCOUNTER — Other Ambulatory Visit: Payer: Self-pay | Admitting: Internal Medicine

## 2013-09-30 DIAGNOSIS — IMO0001 Reserved for inherently not codable concepts without codable children: Secondary | ICD-10-CM | POA: Diagnosis not present

## 2013-09-30 DIAGNOSIS — M1990 Unspecified osteoarthritis, unspecified site: Secondary | ICD-10-CM | POA: Diagnosis not present

## 2013-09-30 DIAGNOSIS — M069 Rheumatoid arthritis, unspecified: Secondary | ICD-10-CM | POA: Diagnosis not present

## 2013-10-27 DIAGNOSIS — M0579 Rheumatoid arthritis with rheumatoid factor of multiple sites without organ or systems involvement: Secondary | ICD-10-CM | POA: Diagnosis not present

## 2013-10-27 DIAGNOSIS — M069 Rheumatoid arthritis, unspecified: Secondary | ICD-10-CM | POA: Diagnosis not present

## 2013-12-03 DIAGNOSIS — M159 Polyosteoarthritis, unspecified: Secondary | ICD-10-CM | POA: Diagnosis not present

## 2013-12-03 DIAGNOSIS — M797 Fibromyalgia: Secondary | ICD-10-CM | POA: Diagnosis not present

## 2013-12-03 DIAGNOSIS — M0579 Rheumatoid arthritis with rheumatoid factor of multiple sites without organ or systems involvement: Secondary | ICD-10-CM | POA: Diagnosis not present

## 2013-12-22 DIAGNOSIS — M0589 Other rheumatoid arthritis with rheumatoid factor of multiple sites: Secondary | ICD-10-CM | POA: Diagnosis not present

## 2014-02-16 ENCOUNTER — Other Ambulatory Visit: Payer: Self-pay | Admitting: Internal Medicine

## 2014-02-27 DIAGNOSIS — M0579 Rheumatoid arthritis with rheumatoid factor of multiple sites without organ or systems involvement: Secondary | ICD-10-CM | POA: Diagnosis not present

## 2014-02-27 DIAGNOSIS — M0589 Other rheumatoid arthritis with rheumatoid factor of multiple sites: Secondary | ICD-10-CM | POA: Diagnosis not present

## 2014-04-24 DIAGNOSIS — M0579 Rheumatoid arthritis with rheumatoid factor of multiple sites without organ or systems involvement: Secondary | ICD-10-CM | POA: Diagnosis not present

## 2014-04-29 DIAGNOSIS — M0579 Rheumatoid arthritis with rheumatoid factor of multiple sites without organ or systems involvement: Secondary | ICD-10-CM | POA: Diagnosis not present

## 2014-04-29 DIAGNOSIS — M797 Fibromyalgia: Secondary | ICD-10-CM | POA: Diagnosis not present

## 2014-04-29 DIAGNOSIS — M159 Polyosteoarthritis, unspecified: Secondary | ICD-10-CM | POA: Diagnosis not present

## 2014-05-22 DIAGNOSIS — M25562 Pain in left knee: Secondary | ICD-10-CM | POA: Diagnosis not present

## 2014-06-11 ENCOUNTER — Encounter: Payer: Self-pay | Admitting: Internal Medicine

## 2014-06-11 ENCOUNTER — Ambulatory Visit (INDEPENDENT_AMBULATORY_CARE_PROVIDER_SITE_OTHER): Payer: Medicare Other | Admitting: Internal Medicine

## 2014-06-11 VITALS — BP 138/88 | HR 73 | Temp 98.1°F | Wt 152.0 lb

## 2014-06-11 DIAGNOSIS — M069 Rheumatoid arthritis, unspecified: Secondary | ICD-10-CM

## 2014-06-11 DIAGNOSIS — Z92241 Personal history of systemic steroid therapy: Secondary | ICD-10-CM

## 2014-06-11 DIAGNOSIS — M1712 Unilateral primary osteoarthritis, left knee: Secondary | ICD-10-CM

## 2014-06-11 NOTE — Progress Notes (Signed)
   Subjective:    Patient ID: Haley Roberts, female    DOB: 10-14-1945, 69 y.o.   MRN: [id number]  HPI She has end-stage degenerative joint disease of left knee. She has had TKR on the right knee in 2012 and had an excellent response. The surgery on the left knee is scheduled 07/07/14 by Dr. Alvan Dame.  She has severe pain in the left knee up to level X .Pain is described as throbbing and increases walking. She is on a narcotic pain medicine for the pain. 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  She has no active symptoms except dyspnea on exertion with ambulation or stairs particularly.  Dr. Ouida Sills treats her rheumatoid arthritis and fibromyalgia. He has taken her off all immunosuppressants except for low-dose prednisone, 5 mg daily. She has been on this low dose orally for a protracted period of time. She's had no symptoms or signs of infection despite taking the oral steroids.  He will perform labs in the next 2 weeks.  Review of Systems    Chest pain, palpitations, tachycardia, paroxysmal nocturnal dyspnea, claudication or edema are absent.  Frontal headache, facial pain , nasal purulence, dental pain, sore throat , otic pain or otic discharge denied. Cough, sputum production, hemoptysis, or pleuritic pain denied. No diarrhea present.Cola colored urine or clay colored stools denied. Dysuria, pyuria, or hematuria not present. No new rashes, pustules, vesicles.  She has no symptoms of sleep apnea such as excess snoring or frank apneic spells.  Epistaxis, hemoptysis, hematuria, melena, or rectal bleeding denied. No unexplained weight loss, significant dyspepsia,dysphagia, or abdominal pain.  There is no abnormal bruising , bleeding, or difficulty stopping bleeding with injury.      Objective:   Physical Exam  Pertinent or positive findings include: She has marked DIP' change in the hands.  She also has interosseous muscle wasting in the  hands. Accentuated curvature of the thoracic spine is present.  She has marked crepitus of the knees without associated effusion. DTRs 0-1/2+ @ knees  General appearance :adequately nourished; in no distress. Eyes: No conjunctival inflammation or scleral icterus is present. Oral exam:  Lips and gums are healthy appearing.There is no oropharyngeal erythema or exudate noted. Dental hygiene is good. Heart:  Normal rate and regular rhythm. S1 and S2 normal without gallop, murmur, click, rub or other extra sounds   Lungs:Chest clear to auscultation; no wheezes, rhonchi,rales ,or rubs present.No increased work of breathing.  Abdomen: bowel sounds normal, soft and non-tender without masses, organomegaly or hernias noted.  No guarding or rebound.  Vascular : all pulses equal ; no bruits present. Skin:Warm & dry.  Intact without suspicious lesions or rashes ; no tenting or jaundice  Lymphatic: No lymphadenopathy is noted about the head, neck, axilla Neuro: Strength, tone normal.        Assessment & Plan:  See Current Assessment & Plan in Problem List under specific Diagnosis

## 2014-06-11 NOTE — Patient Instructions (Signed)
   You are medically cleared for the total knee replacement.  You should obtain copies of all labs done by Dr. Ouida Sills to share with the Anesthesiologist at the preop evaluation.  If there is any blood pressure issue perioperatively; increase in your chronic steroid (prednisone) dose would be necessary to prevent acute adrenal insufficiency.

## 2014-06-11 NOTE — Assessment & Plan Note (Signed)
Perioperatively she will need to be monitored for acute adrenal insufficiency due to chronic adrenal gland suppression due to prolonged low dose oral steroids

## 2014-06-12 DIAGNOSIS — M1712 Unilateral primary osteoarthritis, left knee: Secondary | ICD-10-CM | POA: Diagnosis not present

## 2014-06-30 DIAGNOSIS — M159 Polyosteoarthritis, unspecified: Secondary | ICD-10-CM | POA: Diagnosis not present

## 2014-06-30 DIAGNOSIS — M0579 Rheumatoid arthritis with rheumatoid factor of multiple sites without organ or systems involvement: Secondary | ICD-10-CM | POA: Diagnosis not present

## 2014-06-30 DIAGNOSIS — M797 Fibromyalgia: Secondary | ICD-10-CM | POA: Diagnosis not present

## 2014-06-30 NOTE — H&P (Signed)
TOTAL KNEE ADMISSION H&P  Patient is being admitted for left total knee arthroplasty.  Subjective:  Chief Complaint:    Left knee primary OA / pain.  HPI: Haley Roberts, 69 y.o. female, has a history of pain and functional disability in the left knee due to arthritis and has failed non-surgical conservative treatments for greater than 12 weeks to include NSAID's and/or analgesics, corticosteriod injections and activity modification.  Onset of symptoms was gradual, starting 4+ years ago with gradually worsening course since that time. The patient noted prior procedures on the knee to include  arthroplasty on the right knee about 4 years ago by Dr. Alvan Dame.  Patient currently rates pain in the left knee(s) at 9 out of 10 with activity. Patient has worsening of pain with activity and weight bearing, pain that interferes with activities of daily living, pain with passive range of motion, crepitus and joint swelling.  Patient has evidence of periarticular osteophytes and joint space narrowing by imaging studies. There is no active infection.  Risks, benefits and expectations were discussed with the patient.  Risks including but not limited to the risk of anesthesia, blood clots, nerve damage, blood vessel damage, failure of the prosthesis, infection and up to and including death.  Patient understand the risks, benefits and expectations and wishes to proceed with surgery.   PCP: Unice Cobble, MD  D/C Plans:      Home with HHPT  Post-op Meds:       No Rx given  Tranexamic Acid:      To be given - IV  Decadron:      Is to be given  FYI:     ASA post-op   Norco post-op    Patient Active Problem List   Diagnosis Date Noted  . Right knee DJD 08/29/2010  . GOITER, UNSPECIFIED 03/30/2010  . Rheumatoid arthritis 03/30/2010  . PAIN IN JOINT, MULTIPLE SITES 01/14/2009  . Osteoarthritis 01/12/2009  . UNSPECIFIED NONTOXIC NODULAR GOITER 08/22/2007  . HYPERTENSION 08/22/2007  . HYPERGLYCEMIA,  FASTING 08/22/2007  . ANEMIA-NOS 05/31/2006  . DEPRESSION 05/31/2006  . ABNORMAL THYROID FUNCTION TESTS 05/31/2006   Past Medical History  Diagnosis Date  . Hypertension   . Rheumatoid arthritis(714.0)   . Anxiety   . Depression   . Anemia   . Hyperglycemia     Past Surgical History  Procedure Laterality Date  . Abdominal hysterectomy    . Breast biopsy      No prescriptions prior to admission   Allergies  Allergen Reactions  . Sulfonamide Derivatives Other (See Comments)    BAD HEADACHES/CHILL/FEVER  . Remicade [Infliximab] Other (See Comments)    BP increases    History  Substance Use Topics  . Smoking status: Never Smoker   . Smokeless tobacco: Not on file  . Alcohol Use: No    Family History  Problem Relation Age of Onset  . Cancer Mother     breast  . Arthritis Father   . Hypertension Father   . Diabetes Sister   . Hypertension Brother   . Diabetes Son   . Depression Son      Review of Systems  Constitutional: Negative.   HENT: Negative.   Eyes: Negative.   Respiratory: Negative.   Cardiovascular: Negative.   Gastrointestinal: Negative.   Genitourinary: Negative.   Musculoskeletal: Positive for joint pain.  Skin: Negative.   Neurological: Negative.   Endo/Heme/Allergies: Negative.   Psychiatric/Behavioral: Positive for depression.    Objective:  Physical Exam  Constitutional: She is oriented to person, place, and time. She appears well-developed and well-nourished.  HENT:  Head: Normocephalic and atraumatic.  Eyes: Pupils are equal, round, and reactive to light.  Neck: Neck supple. No JVD present. No tracheal deviation present. No thyromegaly present.  Cardiovascular: Normal rate, regular rhythm, normal heart sounds and intact distal pulses.   Respiratory: Effort normal and breath sounds normal. No stridor. No respiratory distress. She has no wheezes.  GI: Soft. There is no tenderness. There is no guarding.  Musculoskeletal:       Left  knee: She exhibits decreased range of motion, swelling and bony tenderness. She exhibits no ecchymosis, no deformity, no laceration and no erythema. Tenderness found.  Lymphadenopathy:    She has no cervical adenopathy.  Neurological: She is alert and oriented to person, place, and time.  Skin: Skin is warm and dry.  Psychiatric: She has a normal mood and affect.     Labs:  Estimated body mass index is 24.52 kg/(m^2) as calculated from the following:   Height as of 08/29/10: 5' 3.25" (1.607 m).   Weight as of 05/13/13: 63.322 kg (139 lb 9.6 oz).   Imaging Review Plain radiographs demonstrate severe degenerative joint disease of the left knee(s). The overall alignment is neutral. The bone quality appears to be good for age and reported activity level.  Assessment/Plan:  End stage arthritis, left knee   The patient history, physical examination, clinical judgment of the provider and imaging studies are consistent with end stage degenerative joint disease of the left knee(s) and total knee arthroplasty is deemed medically necessary. The treatment options including medical management, injection therapy arthroscopy and arthroplasty were discussed at length. The risks and benefits of total knee arthroplasty were presented and reviewed. The risks due to aseptic loosening, infection, stiffness, patella tracking problems, thromboembolic complications and other imponderables were discussed. The patient acknowledged the explanation, agreed to proceed with the plan and consent was signed. Patient is being admitted for inpatient treatment for surgery, pain control, PT, OT, prophylactic antibiotics, VTE prophylaxis, progressive ambulation and ADL's and discharge planning. The patient is planning to be discharged home with home health services.     [place] Pugh Layson Bertsch   PA-C  06/30/2014, 12:43 PM

## 2014-06-30 NOTE — Patient Instructions (Signed)
Haley Roberts  06/30/2014   Your procedure is scheduled on:    07/07/2014    Report to [facility] Main  Entrance and follow signs to               [place] at     0700 AM.  Call this number if you have problems the morning of surgery [phone]   Remember: ONLY 1 PERSON MAY GO WITH YOU TO SHORT STAY TO GET  READY MORNING OF [place].  Do not eat food or drink liquids :After Midnight.     Take these medicines the morning of surgery with A SIP OF WATER:    Wellbutrin, Lexapro                                You may not have any metal on your body including hair pins and              piercings  Do not wear jewelry, make-up, lotions, powders or perfumes, deodorant             Do not wear nail polish.  Do not shave  48 hours prior to surgery.              Do not bring valuables to the hospital. [place].  Contacts, dentures or bridgework may not be worn into surgery.  Leave suitcase in the car. After surgery it may be brought to your room.       :  Special Instructions: coughing and deep breathing exercises, leg exercises               Please read over the following fact sheets you were given: _____________________________________________________________________             [facility] - Preparing for Surgery Before surgery, you can play an important role.  Because skin is not sterile, your skin needs to be as free of germs as possible.  You can reduce the number of germs on your skin by washing with CHG (chlorahexidine gluconate) soap before surgery.  CHG is an antiseptic cleaner which kills germs and bonds with the skin to continue killing germs even after washing. Please DO NOT use if you have an allergy to CHG or antibacterial soaps.  If your skin becomes reddened/irritated stop using the CHG and inform your nurse when you arrive at Short Stay. Do not shave (including legs and underarms)  for at least 48 hours prior to the first CHG shower.  You may shave your face/neck. Please follow these instructions carefully:  1.  Shower with CHG Soap the night before surgery and the  morning of Surgery.  2.  If you choose to wash your hair, wash your hair first as usual with your  normal  shampoo.  3.  After you shampoo, rinse your hair and body thoroughly to remove the  shampoo.                           4.  Use CHG as you would any other liquid soap.  You can apply chg directly  to the skin and wash  Gently with a scrungie or clean washcloth.  5.  Apply the CHG Soap to your body ONLY FROM THE NECK DOWN.   Do not use on face/ open                           Wound or open sores. Avoid contact with eyes, ears mouth and genitals (private parts).                       Wash face,  Genitals (private parts) with your normal soap.             6.  Wash thoroughly, paying special attention to the area where your surgery  will be performed.  7.  Thoroughly rinse your body with warm water from the neck down.  8.  DO NOT shower/wash with your normal soap after using and rinsing off  the CHG Soap.                9.  Pat yourself dry with a clean towel.            10.  Wear clean pajamas.            11.  Place clean sheets on your bed the night of your first shower and do not  sleep with pets. Day of Surgery : Do not apply any lotions/deodorants the morning of surgery.  Please wear clean clothes to the hospital/surgery center.  FAILURE TO FOLLOW THESE INSTRUCTIONS MAY RESULT IN THE CANCELLATION OF YOUR SURGERY PATIENT SIGNATURE_________________________________  NURSE SIGNATURE__________________________________  ________________________________________________________________________  WHAT IS A BLOOD TRANSFUSION? Blood Transfusion Information  A transfusion is the replacement of blood or some of its parts. Blood is made up of multiple cells which provide different  functions.  Red blood cells carry oxygen and are used for blood loss replacement.  White blood cells fight against infection.  Platelets control bleeding.  Plasma helps clot blood.  Other blood products are available for specialized needs, such as hemophilia or other clotting disorders. BEFORE THE TRANSFUSION  Who gives blood for transfusions?   Healthy volunteers who are fully evaluated to make sure their blood is safe. This is blood bank blood. Transfusion therapy is the safest it has ever been in the practice of medicine. Before blood is taken from a donor, a complete history is taken to make sure that person has no history of diseases nor engages in risky social behavior (examples are intravenous drug use or sexual activity with multiple partners). The donor's travel history is screened to minimize risk of transmitting infections, such as malaria. The donated blood is tested for signs of infectious diseases, such as HIV and hepatitis. The blood is then tested to be sure it is compatible with you in order to minimize the chance of a transfusion reaction. If you or a relative donates blood, this is often done in anticipation of surgery and is not appropriate for emergency situations. It takes many days to process the donated blood. RISKS AND COMPLICATIONS Although transfusion therapy is very safe and saves many lives, the main dangers of transfusion include:  1. Getting an infectious disease. 2. Developing a transfusion reaction. This is an allergic reaction to something in the blood you were given. Every precaution is taken to prevent this. The decision to have a blood transfusion has been considered carefully by your caregiver before blood is given. Blood is not given unless the benefits outweigh  the risks. AFTER THE TRANSFUSION  Right after receiving a blood transfusion, you will usually feel much better and more energetic. This is especially true if your red blood cells have gotten low  (anemic). The transfusion raises the level of the red blood cells which carry oxygen, and this usually causes an energy increase.  The nurse administering the transfusion will monitor you carefully for complications. HOME CARE INSTRUCTIONS  No special instructions are needed after a transfusion. You may find your energy is better. Speak with your caregiver about any limitations on activity for underlying diseases you may have. SEEK MEDICAL CARE IF:   Your condition is not improving after your transfusion.  You develop redness or irritation at the intravenous (IV) site. SEEK IMMEDIATE MEDICAL CARE IF:  Any of the following symptoms occur over the next 12 hours:  Shaking chills.  You have a temperature by mouth above 102 F (38.9 C), not controlled by medicine.  Chest, back, or muscle pain.  People around you feel you are not acting correctly or are confused.  Shortness of breath or difficulty breathing.  Dizziness and fainting.  You get a rash or develop hives.  You have a decrease in urine output.  Your urine turns a dark color or changes to pink, red, or brown. Any of the following symptoms occur over the next 10 days:  You have a temperature by mouth above 102 F (38.9 C), not controlled by medicine.  Shortness of breath.  Weakness after normal activity.  The white part of the eye turns yellow (jaundice).  You have a decrease in the amount of urine or are urinating less often.  Your urine turns a dark color or changes to pink, red, or brown. Document Released: 12/31/1999 Document Revised: 03/27/2011 Document Reviewed: 08/19/2007 ExitCare Patient Information 2014 [place].  _______________________________________________________________________  Incentive Spirometer  An incentive spirometer is a tool that can help keep your lungs clear and active. This tool measures how well you are filling your lungs with each breath. Taking long deep breaths may help  reverse or decrease the chance of developing breathing (pulmonary) problems (especially infection) following:  A long period of time when you are unable to move or be active. BEFORE THE PROCEDURE   If the spirometer includes an indicator to show your best effort, your nurse or respiratory therapist will set it to a desired goal.  If possible, sit up straight or lean slightly forward. Try not to slouch.  Hold the incentive spirometer in an upright position. INSTRUCTIONS FOR USE  3. Sit on the edge of your bed if possible, or sit up as far as you can in bed or on a chair. 4. Hold the incentive spirometer in an upright position. 5. Breathe out normally. 6. Place the mouthpiece in your mouth and seal your lips tightly around it. 7. Breathe in slowly and as deeply as possible, raising the piston or the ball toward the top of the column. 8. Hold your breath for 3-5 seconds or for as long as possible. Allow the piston or ball to fall to the bottom of the column. 9. Remove the mouthpiece from your mouth and breathe out normally. 10. Rest for a few seconds and repeat Steps 1 through 7 at least 10 times every 1-2 hours when you are awake. Take your time and take a few normal breaths between deep breaths. 11. The spirometer may include an indicator to show your best effort. Use the indicator as a goal to work  toward during each repetition. 12. After each set of 10 deep breaths, practice coughing to be sure your lungs are clear. If you have an incision (the cut made at the time of surgery), support your incision when coughing by placing a pillow or rolled up towels firmly against it. Once you are able to get out of bed, walk around indoors and cough well. You may stop using the incentive spirometer when instructed by your caregiver.  RISKS AND COMPLICATIONS  Take your time so you do not get dizzy or light-headed.  If you are in pain, you may need to take or ask for pain medication before doing  incentive spirometry. It is harder to take a deep breath if you are having pain. AFTER USE  Rest and breathe slowly and easily.  It can be helpful to keep track of a log of your progress. Your caregiver can provide you with a simple table to help with this. If you are using the spirometer at home, follow these instructions: [place] IF:   You are having difficultly using the spirometer.  You have trouble using the spirometer as often as instructed.  Your pain medication is not giving enough relief while using the spirometer.  You develop fever of 100.5 F (38.1 C) or higher. SEEK IMMEDIATE MEDICAL CARE IF:   You cough up bloody sputum that had not been present before.  You develop fever of 102 F (38.9 C) or greater.  You develop worsening pain at or near the incision site. MAKE SURE YOU:   Understand these instructions.  Will watch your condition.  Will get help right away if you are not doing well or get worse. Document Released: 05/15/2006 Document Revised: 03/27/2011 Document Reviewed: 07/16/2006 [facility] Patient Information 2014 [place], [place].   ________________________________________________________________________

## 2014-07-01 ENCOUNTER — Encounter (HOSPITAL_COMMUNITY): Payer: Self-pay

## 2014-07-01 ENCOUNTER — Encounter (HOSPITAL_COMMUNITY)
Admission: RE | Admit: 2014-07-01 | Discharge: 2014-07-01 | Disposition: A | Discharge status: Home / Self Care | Payer: Medicare Other | Source: Ambulatory Visit | Attending: Orthopedic Surgery | Admitting: Orthopedic Surgery

## 2014-07-01 DIAGNOSIS — M179 Osteoarthritis of knee, unspecified: Secondary | ICD-10-CM | POA: Insufficient documentation

## 2014-07-01 DIAGNOSIS — Z01818 Encounter for other preprocedural examination: Secondary | ICD-10-CM | POA: Insufficient documentation

## 2014-07-01 HISTORY — DX: Nontoxic multinodular goiter: E04.2

## 2014-07-01 HISTORY — DX: Fibromyalgia: M79.7

## 2014-07-01 LAB — URINALYSIS, ROUTINE W REFLEX MICROSCOPIC
Bilirubin Urine: NEGATIVE
Glucose, UA: NEGATIVE mg/dL
HGB URINE DIPSTICK: NEGATIVE
Ketones, ur: NEGATIVE mg/dL
Nitrite: NEGATIVE
PH: 7.5 (ref 5.0–8.0)
Protein, ur: NEGATIVE mg/dL
SPECIFIC GRAVITY, URINE: 1.008 (ref 1.005–1.030)
UROBILINOGEN UA: 0.2 mg/dL (ref 0.0–1.0)

## 2014-07-01 LAB — CBC
HCT: 33.7 % — ABNORMAL LOW (ref 36.0–46.0)
Hemoglobin: 10.8 g/dL — ABNORMAL LOW (ref 12.0–15.0)
MCH: 29.6 pg (ref 26.0–34.0)
MCHC: 32 g/dL (ref 30.0–36.0)
MCV: 92.3 fL (ref 78.0–100.0)
PLATELETS: 331 10*3/uL (ref 150–400)
RBC: 3.65 MIL/uL — AB (ref 3.87–5.11)
RDW: 14.1 % (ref 11.5–15.5)
WBC: 7 10*3/uL (ref 4.0–10.5)

## 2014-07-01 LAB — BASIC METABOLIC PANEL
ANION GAP: 16 — AB (ref 5–15)
BUN: 19 mg/dL (ref 6–20)
CO2: 28 mmol/L (ref 22–32)
CREATININE: 0.72 mg/dL (ref 0.44–1.00)
Calcium: 9.9 mg/dL (ref 8.9–10.3)
Chloride: 97 mmol/L — ABNORMAL LOW (ref 101–111)
GFR calc non Af Amer: >60 mL/min (ref >60)
Glucose, Bld: 98 mg/dL (ref 65–99)
Potassium: 4.2 mmol/L (ref 3.5–5.1)
Sodium: 141 mmol/L (ref 135–145)

## 2014-07-01 LAB — SURGICAL PCR SCREEN
MRSA, PCR: NEGATIVE
Staphylococcus aureus: NEGATIVE

## 2014-07-01 LAB — PROTIME-INR
INR: 1.06 (ref 0.00–1.49)
PROTHROMBIN TIME: 14 s (ref 11.6–15.2)

## 2014-07-01 LAB — APTT: APTT: 31 s (ref 24–37)

## 2014-07-01 LAB — URINE MICROSCOPIC-ADD ON

## 2014-07-01 NOTE — Progress Notes (Signed)
07-01-14 UA/microscopic done at preop visit faxed to Dr. Alvan Dame via [facility]

## 2014-07-01 NOTE — Progress Notes (Signed)
LOV - 06-11-14 - Dr. Linna Darner (PCP) - see clearance note regarding monitoring for acute adrenal insufficiency. Appointment with Dr, Tobie Lords - rheumatologist - 06-30-14

## 2014-07-01 NOTE — Progress Notes (Deleted)
Blood Refusal Consent Form faxed to [facility] - 07-01-14 Talked to Amy at Dr. Lear Ng office on 07-01-14 to let them know consent signed for blood refusal products, but pt. will accept albumin or albumin-containing products Documented in MD orders , allergies and FYI - 07-01-14 in EPIC 04-28-14 - EKG - EPIC 2V CXR - 04-28-14 - EPIC LOV - 06-23-14 - Vedia Coffer, PAC  - with UA and  CBC - in chart LOV - 02-25-13 - Dr. Woody Seller - in chart Echo - 11-04-12 - in chart EKG - 10-22-12 - in chart Stress Test - 11-18-12- in chart

## 2014-07-07 ENCOUNTER — Inpatient Hospital Stay (HOSPITAL_COMMUNITY): Payer: Medicare Other | Admitting: Anesthesiology

## 2014-07-07 ENCOUNTER — Encounter (HOSPITAL_COMMUNITY)
Admission: RE | Disposition: A | Discharge status: Home Health | Payer: Self-pay | Source: Ambulatory Visit | Attending: Orthopedic Surgery

## 2014-07-07 ENCOUNTER — Inpatient Hospital Stay (HOSPITAL_COMMUNITY)
Admission: RE | Admit: 2014-07-07 | Discharge: 2014-07-08 | DRG: 470 | Disposition: A | Discharge status: Home Health | Payer: Medicare Other | Source: Ambulatory Visit | Attending: Orthopedic Surgery | Admitting: Orthopedic Surgery

## 2014-07-07 ENCOUNTER — Encounter (HOSPITAL_COMMUNITY): Payer: Self-pay | Admitting: *Deleted

## 2014-07-07 DIAGNOSIS — I10 Essential (primary) hypertension: Secondary | ICD-10-CM | POA: Diagnosis not present

## 2014-07-07 DIAGNOSIS — M1712 Unilateral primary osteoarthritis, left knee: Principal | ICD-10-CM | POA: Diagnosis present

## 2014-07-07 DIAGNOSIS — Z96659 Presence of unspecified artificial knee joint: Secondary | ICD-10-CM

## 2014-07-07 DIAGNOSIS — Z833 Family history of diabetes mellitus: Secondary | ICD-10-CM | POA: Diagnosis not present

## 2014-07-07 DIAGNOSIS — Z01812 Encounter for preprocedural laboratory examination: Secondary | ICD-10-CM | POA: Diagnosis not present

## 2014-07-07 DIAGNOSIS — Z8249 Family history of ischemic heart disease and other diseases of the circulatory system: Secondary | ICD-10-CM

## 2014-07-07 DIAGNOSIS — Z6825 Body mass index (BMI) 25.0-25.9, adult: Secondary | ICD-10-CM | POA: Diagnosis not present

## 2014-07-07 DIAGNOSIS — Z96652 Presence of left artificial knee joint: Secondary | ICD-10-CM

## 2014-07-07 DIAGNOSIS — M659 Synovitis and tenosynovitis, unspecified: Secondary | ICD-10-CM | POA: Diagnosis present

## 2014-07-07 DIAGNOSIS — D649 Anemia, unspecified: Secondary | ICD-10-CM | POA: Diagnosis not present

## 2014-07-07 DIAGNOSIS — M179 Osteoarthritis of knee, unspecified: Secondary | ICD-10-CM | POA: Diagnosis not present

## 2014-07-07 DIAGNOSIS — E663 Overweight: Secondary | ICD-10-CM | POA: Diagnosis present

## 2014-07-07 DIAGNOSIS — M25562 Pain in left knee: Secondary | ICD-10-CM | POA: Diagnosis not present

## 2014-07-07 DIAGNOSIS — M797 Fibromyalgia: Secondary | ICD-10-CM | POA: Diagnosis not present

## 2014-07-07 HISTORY — PX: TOTAL KNEE ARTHROPLASTY: SHX125

## 2014-07-07 LAB — TYPE AND SCREEN
ABO/RH(D): O POS
ANTIBODY SCREEN: NEGATIVE

## 2014-07-07 SURGERY — ARTHROPLASTY, KNEE, TOTAL
Anesthesia: Spinal | Site: Knee | Laterality: Left

## 2014-07-07 MED ORDER — POLYETHYLENE GLYCOL 3350 17 G PO PACK
17.0000 g | PACK | Freq: Two times a day (BID) | ORAL | Status: DC
  Administered 2014-07-07 – 2014-07-08 (×2): 17 g via ORAL

## 2014-07-07 MED ORDER — KETOROLAC TROMETHAMINE 30 MG/ML IJ SOLN
INTRAMUSCULAR | Status: AC
  Filled 2014-07-07: qty 1

## 2014-07-07 MED ORDER — LOSARTAN POTASSIUM 50 MG PO TABS
100.0000 mg | ORAL_TABLET | Freq: Every day | ORAL | Status: DC
  Administered 2014-07-07 – 2014-07-08 (×2): 100 mg via ORAL
  Filled 2014-07-07 (×2): qty 2

## 2014-07-07 MED ORDER — BUPIVACAINE-EPINEPHRINE (PF) 0.25% -1:200000 IJ SOLN
INTRAMUSCULAR | Status: AC
  Filled 2014-07-07: qty 30

## 2014-07-07 MED ORDER — HYDROMORPHONE HCL 1 MG/ML IJ SOLN: 0.2500 mg | INTRAMUSCULAR | Status: DC | PRN

## 2014-07-07 MED ORDER — SODIUM CHLORIDE 0.9 % IJ SOLN
INTRAMUSCULAR | Status: AC
  Filled 2014-07-07: qty 50

## 2014-07-07 MED ORDER — CEFAZOLIN SODIUM-DEXTROSE 2-3 GM-% IV SOLR
INTRAVENOUS | Status: AC
  Filled 2014-07-07: qty 50

## 2014-07-07 MED ORDER — DEXAMETHASONE SODIUM PHOSPHATE 10 MG/ML IJ SOLN
10.0000 mg | Freq: Once | INTRAMUSCULAR | Status: AC
Start: 2014-07-07 — End: 2014-07-07
  Administered 2014-07-07: 10 mg via INTRAVENOUS

## 2014-07-07 MED ORDER — SODIUM CHLORIDE 0.9 % IJ SOLN
INTRAMUSCULAR | Status: DC | PRN
  Administered 2014-07-07: 30 mL

## 2014-07-07 MED ORDER — FENTANYL CITRATE (PF) 100 MCG/2ML IJ SOLN
INTRAMUSCULAR | Status: AC
  Filled 2014-07-07: qty 2

## 2014-07-07 MED ORDER — DEXAMETHASONE SODIUM PHOSPHATE 10 MG/ML IJ SOLN
10.0000 mg | Freq: Once | INTRAMUSCULAR | Status: AC
  Administered 2014-07-08: 10 mg via INTRAVENOUS
  Filled 2014-07-07: qty 1

## 2014-07-07 MED ORDER — TRANEXAMIC ACID 1000 MG/10ML IV SOLN
1000.0000 mg | Freq: Once | INTRAVENOUS | Status: AC
  Administered 2014-07-07: 1000 mg via INTRAVENOUS
  Filled 2014-07-07: qty 10

## 2014-07-07 MED ORDER — CLORAZEPATE DIPOTASSIUM 7.5 MG PO TABS: 7.5000 mg | ORAL_TABLET | Freq: Two times a day (BID) | ORAL | Status: DC | PRN

## 2014-07-07 MED ORDER — DEXTROSE 5 % IV SOLN
500.0000 mg | Freq: Four times a day (QID) | INTRAVENOUS | Status: DC | PRN
  Administered 2014-07-07: 500 mg via INTRAVENOUS
  Filled 2014-07-07 (×2): qty 5

## 2014-07-07 MED ORDER — PROPOFOL INFUSION 10 MG/ML OPTIME
INTRAVENOUS | Status: DC | PRN
  Administered 2014-07-07: 70 ug/kg/min via INTRAVENOUS

## 2014-07-07 MED ORDER — METOCLOPRAMIDE HCL 10 MG PO TABS: 5.0000 mg | ORAL_TABLET | Freq: Three times a day (TID) | ORAL | Status: DC | PRN

## 2014-07-07 MED ORDER — HYDROMORPHONE HCL 1 MG/ML IJ SOLN
0.5000 mg | INTRAMUSCULAR | Status: DC | PRN
  Administered 2014-07-07 (×3): 1 mg via INTRAVENOUS
  Filled 2014-07-07 (×3): qty 1

## 2014-07-07 MED ORDER — PHENOL 1.4 % MT LIQD
1.0000 | OROMUCOSAL | Status: DC | PRN
  Filled 2014-07-07: qty 177

## 2014-07-07 MED ORDER — PROPOFOL 10 MG/ML IV BOLUS
INTRAVENOUS | Status: AC
  Filled 2014-07-07: qty 20

## 2014-07-07 MED ORDER — ONDANSETRON HCL 4 MG/2ML IJ SOLN: 4.0000 mg | Freq: Four times a day (QID) | INTRAMUSCULAR | Status: DC | PRN

## 2014-07-07 MED ORDER — LACTATED RINGERS IV SOLN
INTRAVENOUS | Status: DC
  Administered 2014-07-07: 11:00:00 via INTRAVENOUS
  Administered 2014-07-07: 1000 mL via INTRAVENOUS
  Administered 2014-07-07: 11:00:00 via INTRAVENOUS

## 2014-07-07 MED ORDER — SODIUM CHLORIDE 0.9 % IV SOLN
INTRAVENOUS | Status: DC
  Administered 2014-07-07: 15:00:00 via INTRAVENOUS
  Filled 2014-07-07 (×6): qty 1000

## 2014-07-07 MED ORDER — MENTHOL 3 MG MT LOZG: 1.0000 | LOZENGE | OROMUCOSAL | Status: DC | PRN

## 2014-07-07 MED ORDER — PNEUMOCOCCAL VAC POLYVALENT 25 MCG/0.5ML IJ INJ
0.5000 mL | INJECTION | INTRAMUSCULAR | Status: DC
  Filled 2014-07-07 (×2): qty 0.5

## 2014-07-07 MED ORDER — BUPIVACAINE HCL (PF) 0.75 % IJ SOLN
INTRAMUSCULAR | Status: DC | PRN
  Administered 2014-07-07: 15 mg

## 2014-07-07 MED ORDER — BISACODYL 10 MG RE SUPP
10.0000 mg | Freq: Every day | RECTAL | Status: DC | PRN
Start: 2014-07-07 — End: 2014-07-08

## 2014-07-07 MED ORDER — PHENYLEPHRINE HCL 10 MG/ML IJ SOLN
INTRAMUSCULAR | Status: DC | PRN
  Administered 2014-07-07 (×2): 40 ug via INTRAVENOUS
  Administered 2014-07-07 (×2): 80 ug via INTRAVENOUS

## 2014-07-07 MED ORDER — BUPIVACAINE-EPINEPHRINE (PF) 0.25% -1:200000 IJ SOLN
INTRAMUSCULAR | Status: DC | PRN
  Administered 2014-07-07: 30 mL via PERINEURAL

## 2014-07-07 MED ORDER — MIDAZOLAM HCL 5 MG/5ML IJ SOLN
INTRAMUSCULAR | Status: DC | PRN
  Administered 2014-07-07: 2 mg via INTRAVENOUS

## 2014-07-07 MED ORDER — METOCLOPRAMIDE HCL 5 MG/ML IJ SOLN: 5.0000 mg | Freq: Three times a day (TID) | INTRAMUSCULAR | Status: DC | PRN

## 2014-07-07 MED ORDER — HYDROCODONE-ACETAMINOPHEN 7.5-325 MG PO TABS
1.0000 | ORAL_TABLET | ORAL | Status: DC
  Administered 2014-07-07: 1 via ORAL
  Administered 2014-07-07 – 2014-07-08 (×5): 2 via ORAL
  Filled 2014-07-07 (×2): qty 2
  Filled 2014-07-07: qty 1
  Filled 2014-07-07 (×3): qty 2

## 2014-07-07 MED ORDER — ALUM & MAG HYDROXIDE-SIMETH 200-200-20 MG/5ML PO SUSP
30.0000 mL | ORAL | Status: DC | PRN
Start: 2014-07-07 — End: 2014-07-08

## 2014-07-07 MED ORDER — ASPIRIN EC 325 MG PO TBEC
325.0000 mg | DELAYED_RELEASE_TABLET | Freq: Two times a day (BID) | ORAL | Status: DC
  Administered 2014-07-08: 325 mg via ORAL
  Filled 2014-07-07 (×3): qty 1

## 2014-07-07 MED ORDER — FERROUS SULFATE 325 (65 FE) MG PO TABS
325.0000 mg | ORAL_TABLET | Freq: Three times a day (TID) | ORAL | Status: DC
  Administered 2014-07-07 – 2014-07-08 (×3): 325 mg via ORAL
  Filled 2014-07-07 (×5): qty 1

## 2014-07-07 MED ORDER — CHLORHEXIDINE GLUCONATE 4 % EX LIQD: 60.0000 mL | Freq: Once | CUTANEOUS | Status: DC

## 2014-07-07 MED ORDER — DIPHENHYDRAMINE HCL 25 MG PO CAPS: 25.0000 mg | ORAL_CAPSULE | Freq: Four times a day (QID) | ORAL | Status: DC | PRN

## 2014-07-07 MED ORDER — METHOCARBAMOL 500 MG PO TABS
500.0000 mg | ORAL_TABLET | Freq: Four times a day (QID) | ORAL | Status: DC | PRN
  Administered 2014-07-08 (×2): 500 mg via ORAL
  Filled 2014-07-07 (×2): qty 1

## 2014-07-07 MED ORDER — CEFAZOLIN SODIUM-DEXTROSE 2-3 GM-% IV SOLR
2.0000 g | INTRAVENOUS | Status: AC
  Administered 2014-07-07: 2 g via INTRAVENOUS

## 2014-07-07 MED ORDER — ONDANSETRON HCL 4 MG PO TABS: 4.0000 mg | ORAL_TABLET | Freq: Four times a day (QID) | ORAL | Status: DC | PRN

## 2014-07-07 MED ORDER — MIDAZOLAM HCL 2 MG/2ML IJ SOLN
INTRAMUSCULAR | Status: AC
  Filled 2014-07-07: qty 2

## 2014-07-07 MED ORDER — ESCITALOPRAM OXALATE 10 MG PO TABS
10.0000 mg | ORAL_TABLET | Freq: Every day | ORAL | Status: DC
  Administered 2014-07-07 – 2014-07-08 (×2): 10 mg via ORAL
  Filled 2014-07-07 (×2): qty 1

## 2014-07-07 MED ORDER — FENTANYL CITRATE (PF) 100 MCG/2ML IJ SOLN
INTRAMUSCULAR | Status: DC | PRN
  Administered 2014-07-07: 100 ug via INTRAVENOUS

## 2014-07-07 MED ORDER — DEXAMETHASONE SODIUM PHOSPHATE 10 MG/ML IJ SOLN
INTRAMUSCULAR | Status: AC
Start: 2014-07-07 — End: 2014-07-07
  Filled 2014-07-07: qty 1

## 2014-07-07 MED ORDER — PREDNISONE 5 MG PO TABS
5.0000 mg | ORAL_TABLET | Freq: Every day | ORAL | Status: DC
  Administered 2014-07-07 – 2014-07-08 (×2): 5 mg via ORAL
  Filled 2014-07-07 (×2): qty 1

## 2014-07-07 MED ORDER — CEFAZOLIN SODIUM-DEXTROSE 2-3 GM-% IV SOLR
2.0000 g | Freq: Four times a day (QID) | INTRAVENOUS | Status: AC
  Administered 2014-07-07 (×2): 2 g via INTRAVENOUS
  Filled 2014-07-07 (×2): qty 50

## 2014-07-07 MED ORDER — PROMETHAZINE HCL 25 MG/ML IJ SOLN
6.2500 mg | INTRAMUSCULAR | Status: DC | PRN
Start: 2014-07-07 — End: 2014-07-07

## 2014-07-07 MED ORDER — MAGNESIUM CITRATE PO SOLN: 1.0000 | Freq: Once | ORAL | Status: AC | PRN

## 2014-07-07 MED ORDER — ONDANSETRON HCL 4 MG/2ML IJ SOLN
INTRAMUSCULAR | Status: DC | PRN
  Administered 2014-07-07: 4 mg via INTRAVENOUS

## 2014-07-07 MED ORDER — KETOROLAC TROMETHAMINE 30 MG/ML IJ SOLN
INTRAMUSCULAR | Status: DC | PRN
  Administered 2014-07-07: 30 mg via INTRAVENOUS

## 2014-07-07 MED ORDER — DOCUSATE SODIUM 100 MG PO CAPS
100.0000 mg | ORAL_CAPSULE | Freq: Two times a day (BID) | ORAL | Status: DC
  Administered 2014-07-07 – 2014-07-08 (×2): 100 mg via ORAL

## 2014-07-07 MED ORDER — BUPROPION HCL ER (XL) 300 MG PO TB24
300.0000 mg | ORAL_TABLET | Freq: Every day | ORAL | Status: DC
  Administered 2014-07-08: 300 mg via ORAL
  Filled 2014-07-07: qty 1

## 2014-07-07 MED ORDER — PHENYLEPHRINE 40 MCG/ML (10ML) SYRINGE FOR IV PUSH (FOR BLOOD PRESSURE SUPPORT)
PREFILLED_SYRINGE | INTRAVENOUS | Status: AC
  Filled 2014-07-07: qty 10

## 2014-07-07 MED ORDER — ONDANSETRON HCL 4 MG/2ML IJ SOLN
INTRAMUSCULAR | Status: AC
  Filled 2014-07-07: qty 2

## 2014-07-07 SURGICAL SUPPLY — 56 items
BAG DECANTER FOR FLEXI CONT (MISCELLANEOUS) IMPLANT
BAG SPEC THK2 15X12 ZIP CLS (MISCELLANEOUS)
BAG ZIPLOCK 12X15 (MISCELLANEOUS) IMPLANT
BANDAGE ELASTIC 6 VELCRO ST LF (GAUZE/BANDAGES/DRESSINGS) ×2 IMPLANT
BANDAGE ESMARK 6X9 LF (GAUZE/BANDAGES/DRESSINGS) ×1 IMPLANT
BLADE SAW SGTL 13.0X1.19X90.0M (BLADE) ×2 IMPLANT
BNDG CMPR 9X6 STRL LF SNTH (GAUZE/BANDAGES/DRESSINGS) ×1
BNDG ESMARK 6X9 LF (GAUZE/BANDAGES/DRESSINGS) ×2
BOWL SMART MIX CTS (DISPOSABLE) ×2 IMPLANT
CAP KNEE TOTAL 3 SIGMA ×1 IMPLANT
CEMENT HV SMART SET (Cement) ×2 IMPLANT
CUFF TOURN SGL QUICK 34 (TOURNIQUET CUFF) ×2
CUFF TRNQT CYL 34X4X40X1 (TOURNIQUET CUFF) ×1 IMPLANT
DECANTER SPIKE VIAL GLASS SM (MISCELLANEOUS) ×2 IMPLANT
DRAPE EXTREMITY T 121X128X90 (DRAPE) ×2 IMPLANT
DRAPE POUCH INSTRU U-SHP 10X18 (DRAPES) ×2 IMPLANT
DRAPE U-SHAPE 47X51 STRL (DRAPES) ×2 IMPLANT
DRSG AQUACEL AG ADV 3.5X10 (GAUZE/BANDAGES/DRESSINGS) ×2 IMPLANT
DURAPREP 26ML APPLICATOR (WOUND CARE) ×4 IMPLANT
ELECT REM PT RETURN 9FT ADLT (ELECTROSURGICAL) ×2
ELECTRODE REM PT RTRN 9FT ADLT (ELECTROSURGICAL) ×1 IMPLANT
FACESHIELD WRAPAROUND (MASK) ×10 IMPLANT
FACESHIELD WRAPAROUND OR TEAM (MASK) ×5 IMPLANT
GLOVE BIOGEL PI IND STRL 7.5 (GLOVE) ×1 IMPLANT
GLOVE BIOGEL PI IND STRL 8.5 (GLOVE) ×1 IMPLANT
GLOVE BIOGEL PI INDICATOR 7.5 (GLOVE) ×1
GLOVE BIOGEL PI INDICATOR 8.5 (GLOVE) ×1
GLOVE ECLIPSE 8.0 STRL XLNG CF (GLOVE) ×2 IMPLANT
GLOVE ORTHO TXT STRL SZ7.5 (GLOVE) ×4 IMPLANT
GOWN SPEC L3 XXLG W/TWL (GOWN DISPOSABLE) ×2 IMPLANT
GOWN STRL REUS W/TWL LRG LVL3 (GOWN DISPOSABLE) ×2 IMPLANT
HANDPIECE INTERPULSE COAX TIP (DISPOSABLE) ×2
KIT BASIN OR (CUSTOM PROCEDURE TRAY) ×2 IMPLANT
LIQUID BAND (GAUZE/BANDAGES/DRESSINGS) ×2 IMPLANT
MANIFOLD NEPTUNE II (INSTRUMENTS) ×2 IMPLANT
NDL SAFETY ECLIPSE 18X1.5 (NEEDLE) ×1 IMPLANT
NEEDLE HYPO 18GX1.5 SHARP (NEEDLE) ×2
PACK TOTAL JOINT (CUSTOM PROCEDURE TRAY) ×2 IMPLANT
PEN SKIN MARKING BROAD (MISCELLANEOUS) ×2 IMPLANT
POSITIONER SURGICAL ARM (MISCELLANEOUS) ×2 IMPLANT
SET HNDPC FAN SPRY TIP SCT (DISPOSABLE) ×1 IMPLANT
SET PAD KNEE POSITIONER (MISCELLANEOUS) ×2 IMPLANT
SLEEVE SURGEON STRL (DRAPES) ×1 IMPLANT
SUCTION FRAZIER 12FR DISP (SUCTIONS) ×2 IMPLANT
SUT MNCRL AB 4-0 PS2 18 (SUTURE) ×2 IMPLANT
SUT VIC AB 1 CT1 36 (SUTURE) ×2 IMPLANT
SUT VIC AB 2-0 CT1 27 (SUTURE) ×6
SUT VIC AB 2-0 CT1 TAPERPNT 27 (SUTURE) ×3 IMPLANT
SUT VLOC 180 0 24IN GS25 (SUTURE) ×2 IMPLANT
SYR 50ML LL SCALE MARK (SYRINGE) ×2 IMPLANT
TOWEL OR 17X26 10 PK STRL BLUE (TOWEL DISPOSABLE) ×2 IMPLANT
TOWEL OR NON WOVEN STRL DISP B (DISPOSABLE) ×1 IMPLANT
TRAY FOLEY W/METER SILVER 14FR (SET/KITS/TRAYS/PACK) ×2 IMPLANT
WATER STERILE IRR 1500ML POUR (IV SOLUTION) ×2 IMPLANT
WRAP KNEE MAXI GEL POST OP (GAUZE/BANDAGES/DRESSINGS) ×2 IMPLANT
YANKAUER SUCT BULB TIP 10FT TU (MISCELLANEOUS) ×2 IMPLANT

## 2014-07-07 NOTE — Anesthesia Postprocedure Evaluation (Signed)
  Anesthesia Post-op Note  Patient: Haley Roberts  Procedure(s) Performed: Procedure(s) (LRB): LEFT TOTAL KNEE ARTHROPLASTY (Left)  Patient Location: PACU  Anesthesia Type: Spinal  Level of Consciousness: awake and alert   Airway and Oxygen Therapy: Patient Spontanous Breathing  Post-op Pain: mild  Post-op Assessment: Post-op Vital signs reviewed, Patient's Cardiovascular Status Stable, Respiratory Function Stable, Patent Airway and No signs of Nausea or vomiting  Last Vitals:  Filed Vitals:   07/07/14 1145  BP: 125/73  Pulse: 80  Temp:   Resp: 14    Post-op Vital Signs: stable   Complications: No apparent anesthesia complications

## 2014-07-07 NOTE — Anesthesia Preprocedure Evaluation (Signed)
Anesthesia Evaluation  Patient identified by MRN, date of birth, ID band Patient awake    Reviewed: Allergy & Precautions, NPO status , Patient's Chart, lab work & pertinent test results  Airway Mallampati: II  TM Distance: >3 FB Neck ROM: Full    Dental no notable dental hx.    Pulmonary neg pulmonary ROS,  breath sounds clear to auscultation  Pulmonary exam normal       Cardiovascular hypertension, Pt. on medications Normal cardiovascular examRhythm:Regular Rate:Normal     Neuro/Psych Anxiety negative neurological ROS     GI/Hepatic negative GI ROS, Neg liver ROS,   Endo/Other  negative endocrine ROS  Renal/GU negative Renal ROS  negative genitourinary   Musculoskeletal  (+) Arthritis -, Rheumatoid disorders and on steriods ,    Abdominal   Peds negative pediatric ROS (+)  Hematology negative hematology ROS (+)   Anesthesia Other Findings   Reproductive/Obstetrics negative OB ROS                             Anesthesia Physical Anesthesia Plan  ASA: III  Anesthesia Plan: Spinal   Post-op Pain Management:    Induction: Intravenous  Airway Management Planned: Simple Face Mask  Additional Equipment:   Intra-op Plan:   Post-operative Plan:   Informed Consent: I have reviewed the patients History and Physical, chart, labs and discussed the procedure including the risks, benefits and alternatives for the proposed anesthesia with the patient or authorized representative who has indicated his/her understanding and acceptance.   Dental advisory given  Plan Discussed with: CRNA and Surgeon  Anesthesia Plan Comments:         Anesthesia Quick Evaluation

## 2014-07-07 NOTE — Op Note (Signed)
NAME:  Haley Roberts                      MEDICAL RECORD NO.:  [id number]                             FACILITY:  [facility]      PHYSICIAN:  Pietro Cassis. Alvan Dame, M.D.  DATE OF BIRTH:  Mar 12, 1945      DATE OF PROCEDURE:  07/07/2014                                     OPERATIVE REPORT         PREOPERATIVE DIAGNOSIS:  Left knee osteoarthritis.      POSTOPERATIVE DIAGNOSIS:  Left knee osteoarthritis.      FINDINGS:  The patient was noted to have complete loss of cartilage and   bone-on-bone arthritis with associated osteophytes in the medial and patellofemoral compartments of   the knee with a significant synovitis and associated effusion.      PROCEDURE:  Left total knee replacement.      COMPONENTS USED:  DeP[employer] Sigma rotating platform posterior stabilized knee   system, a size 2.5 femur, 2.5 tibia, 12.5 mm PS insert, and 38 patellar   button.      SURGEON:  Pietro Cassis. Alvan Dame, M.D.      ASSISTANT:  Molli Barrows, PA-C.      ANESTHESIA:  Spinal.      SPECIMENS:  None.      COMPLICATION:  None.      DRAINS:  None.  EBL: <100cc      TOURNIQUET TIME:   Total Tourniquet Time Documented: Thigh (Left) - 35 minutes Total: Thigh (Left) - 35 minutes  .      The patient was stable to the recovery room.      INDICATION FOR PROCEDURE:  Haley Roberts is a 69 y.o. female patient of   mine.  The patient had been seen, evaluated, and treated conservatively in the   office with medication, activity modification, and injections.  The patient had   radiographic changes of bone-on-bone arthritis with endplate sclerosis and osteophytes noted.      The patient failed conservative measures including medication, injections, and activity modification, and at this point was ready for more definitive measures.   Based on the radiographic changes and failed conservative measures, the patient   decided to proceed with total knee replacement.  Risks of infection,   DVT, component failure, need for  revision surgery, postop course, and   expectations were all   discussed and reviewed.  Consent was obtained for benefit of pain   relief.      PROCEDURE IN DETAIL:  The patient was brought to the operative theater.   Once adequate anesthesia, preoperative antibiotics, 2 gm of Ancef, 1 gm of Tranexamic Acid, and 10mg  of Decadron administered, the patient was positioned supine with the left thigh tourniquet placed.  The  left lower extremity was prepped and draped in sterile fashion.  A time-   out was performed identifying the patient, planned procedure, and   extremity.      The left lower extremity was placed in the [facility] leg holder.  The leg was   exsanguinated, tourniquet elevated to 250 mmHg.  A midline incision was   made followed by median parapatellar  arthrotomy.  Following initial   exposure, attention was first directed to the patella.  Precut   measurement was noted to be 24 mm.  I resected down to 14 mm and used a   38 patellar button to restore patellar height as well as cover the cut   surface.      The lug holes were drilled and a metal shim was placed to protect the   patella from retractors and saw blades.      At this point, attention was now directed to the femur.  The femoral   canal was opened with a drill, irrigated to try to prevent fat emboli.  An   intramedullary rod was passed at 3 degrees valgus, 10 mm of bone was   resected off the distal femur.  Following this resection, the tibia was   subluxated anteriorly.  Using the extramedullary guide, 8 mm of bone was resected off   the proximal lateral tibia.  We confirmed the gap would be   stable medially and laterally with a 10 mm insert as well as confirmed   the cut was perpendicular in the coronal plane, checking with an alignment rod.      Once this was done, I sized the femur to be a size 2.5 in the anterior-   posterior dimension, chose a standard component based on medial and   lateral dimension.  The  size 2.5 rotation block was then pinned in   position anterior referenced using the C-clamp to set rotation.  The   anterior, posterior, and  chamfer cuts were made without difficulty nor   notching making certain that I was along the anterior cortex to help   with flexion gap stability.      The final box cut was made off the lateral aspect of distal femur.      At this point, the tibia was sized to be a size 2.5, the size 2.5 tray was   then pinned in position through the medial third of the tubercle, lateralized due to significant medial tibial plateau deformity,   drilled, and keel punched.  Trial reduction was now carried with a 2.5 femur,  2.5 tibia, a 10 then 12.5 mm insert, and the 38 patella botton.  The knee was brought to   extension, full extension with good flexion stability with the patella   tracking through the trochlea without application of pressure.  Given   all these findings, the trial components removed.  Final components were   opened and cement was mixed.  The knee was irrigated with normal saline   solution and pulse lavage.  The synovial lining was   then injected with 30cc of 0.25% Marcaine with epinephrine and 1 cc of Toradol 30 cc of NS for a  total of 61 cc.      The knee was irrigated.  Final implants were then cemented onto clean and   dried cut surfaces of bone with the knee brought to extension with a 12.5 mm trial insert.      Once the cement had fully cured, the excess cement was removed   throughout the knee.  I confirmed I was satisfied with the range of   motion and stability, and the final 12.5 mm PS insert was chosen.  It was   placed into the knee.      The tourniquet had been let down at 35 minutes.  No significant   hemostasis required.  The  extensor mechanism was then reapproximated using #1 Vicryl and #0 V-lock sutures with the knee   in flexion.  The   remaining wound was closed with 2-0 Vicryl and running 4-0 Monocryl.   The knee was  cleaned, dried, dressed sterilely using Dermabond and   Aquacel dressing.  The patient was then   brought to recovery room in stable condition, tolerating the procedure   well.   Please note that Physician Assistant, Molli Barrows, PA-C, was present for the entirety of the case, and was utilized for pre-operative positioning, peri-operative retractor management, general facilitation of the procedure.  He was also utilized for primary wound closure at the end of the case.              Pietro Cassis Alvan Dame, M.D.    07/07/2014 11:19 AM

## 2014-07-07 NOTE — Transfer of Care (Signed)
Immediate Anesthesia Transfer of Care Note  Patient: Haley Roberts  Procedure(s) Performed: Procedure(s): LEFT TOTAL KNEE ARTHROPLASTY (Left)  Patient Location: PACU  Anesthesia Type:Spinal  Level of Consciousness: awake, alert  and oriented  Airway & Oxygen Therapy: Patient Spontanous Breathing and Patient connected to face mask oxygen  Post-op Assessment: Report given to RN, Post -op Vital signs reviewed and stable and SAB T12.  Post vital signs: Reviewed and stable  Last Vitals:  Filed Vitals:   07/07/14 0705  BP: 175/93  Pulse: 78  Temp: 36.8 C  Resp: 18    Complications: No apparent anesthesia complications

## 2014-07-07 NOTE — Anesthesia Procedure Notes (Signed)
Spinal Patient location during procedure: OR Start time: 07/07/2014 9:49 AM End time: 07/07/2014 9:56 AM Staffing Resident/CRNA: Harle Stanford R Performed by: resident/CRNA  Preanesthetic Checklist Completed: patient identified, site marked, surgical consent, pre-op evaluation, timeout performed, IV checked, risks and benefits discussed and monitors and equipment checked Spinal Block Patient position: sitting Prep: Betadine Patient monitoring: heart rate, cardiac monitor, continuous pulse ox and blood pressure Approach: midline Location: L3-4 Injection technique: single-shot Needle Needle type: Sprotte  Needle gauge: 24 G Needle length: 9 cm Needle insertion depth: 7 cm Assessment Sensory level: T6 Additional Notes Midline is to the Left.

## 2014-07-07 NOTE — Progress Notes (Signed)
Utilization review completed.  

## 2014-07-07 NOTE — Interval H&P Note (Signed)
History and Physical Interval Note:  07/07/2014 8:53 AM  Haley Roberts  has presented today for surgery, with the diagnosis of left knee osteoarthritis  The various methods of treatment have been discussed with the patient and family. After consideration of risks, benefits and other options for treatment, the patient has consented to  Procedure(s): LEFT TOTAL KNEE ARTHROPLASTY (Left) as a surgical intervention .  The patient's history has been reviewed, patient examined, no change in status, stable for surgery.  I have reviewed the patient's chart and labs.  Questions were answered to the patient's satisfaction.     Mauri Pole

## 2014-07-08 ENCOUNTER — Encounter (HOSPITAL_COMMUNITY): Payer: Self-pay | Admitting: Orthopedic Surgery

## 2014-07-08 DIAGNOSIS — E663 Overweight: Secondary | ICD-10-CM | POA: Diagnosis present

## 2014-07-08 LAB — CBC
HEMATOCRIT: 26.8 % — AB (ref 36.0–46.0)
Hemoglobin: 8.4 g/dL — ABNORMAL LOW (ref 12.0–15.0)
MCH: 29.2 pg (ref 26.0–34.0)
MCHC: 31.3 g/dL (ref 30.0–36.0)
MCV: 93.1 fL (ref 78.0–100.0)
PLATELETS: 252 10*3/uL (ref 150–400)
RBC: 2.88 MIL/uL — ABNORMAL LOW (ref 3.87–5.11)
RDW: 14.1 % (ref 11.5–15.5)
WBC: 6.9 10*3/uL (ref 4.0–10.5)

## 2014-07-08 LAB — BASIC METABOLIC PANEL
Anion gap: 6 (ref 5–15)
BUN: 14 mg/dL (ref 6–20)
CHLORIDE: 105 mmol/L (ref 101–111)
CO2: 28 mmol/L (ref 22–32)
Calcium: 8.8 mg/dL — ABNORMAL LOW (ref 8.9–10.3)
Creatinine, Ser: 0.65 mg/dL (ref 0.44–1.00)
GFR calc Af Amer: >60 mL/min (ref >60)
GFR calc non Af Amer: >60 mL/min (ref >60)
GLUCOSE: 107 mg/dL — AB (ref 65–99)
POTASSIUM: 4 mmol/L (ref 3.5–5.1)
SODIUM: 139 mmol/L (ref 135–145)

## 2014-07-08 MED ORDER — HYDROCODONE-ACETAMINOPHEN 7.5-325 MG PO TABS: 1.0000 | ORAL_TABLET | ORAL | Status: DC | PRN

## 2014-07-08 MED ORDER — DOCUSATE SODIUM 100 MG PO CAPS: 100.0000 mg | ORAL_CAPSULE | Freq: Two times a day (BID) | ORAL | Status: DC

## 2014-07-08 MED ORDER — POLYETHYLENE GLYCOL 3350 17 G PO PACK: 17.0000 g | PACK | Freq: Two times a day (BID) | ORAL | Status: DC

## 2014-07-08 MED ORDER — FERROUS SULFATE 325 (65 FE) MG PO TABS: 325.0000 mg | ORAL_TABLET | Freq: Three times a day (TID) | ORAL | Status: DC

## 2014-07-08 MED ORDER — ASPIRIN 325 MG PO TBEC
325.0000 mg | DELAYED_RELEASE_TABLET | Freq: Two times a day (BID) | ORAL | Status: AC
Start: 2014-07-08 — End: 2014-08-05

## 2014-07-08 MED ORDER — METHOCARBAMOL 500 MG PO TABS: 500.0000 mg | ORAL_TABLET | Freq: Four times a day (QID) | ORAL | Status: DC | PRN

## 2014-07-08 NOTE — Evaluation (Signed)
Occupational Therapy Evaluation Patient Details Name: Haley Roberts MRN: [id number] DOB: March 19, 1945 Today's Date: 07/08/2014    History of Present Illness s/p L TKA   Clinical Impression   This 69 year old female was admitted for the above surgery. All education was completed.  No further OT is needed at this time    Follow Up Recommendations  No OT follow up    Equipment Recommendations  None recommended by OT    Recommendations for Other Services       Precautions / Restrictions Precautions Precautions: Knee Restrictions Weight Bearing Restrictions: No      Mobility Bed Mobility                  Transfers Overall transfer level: Needs assistance Equipment used: Rolling walker (2 wheeled) Transfers: Sit to/from Stand Sit to Stand: Min guard         General transfer comment: cues for UE placement    Balance                                            ADL Overall ADL's : Needs assistance/impaired             Lower Body Bathing: Minimal assistance;Sit to/from stand       Lower Body Dressing: Minimal assistance;Sit to/from stand                 General ADL Comments: pt can complete UB adls with set up; min A for LB adls.  Son will assist (socks).  Pt has an accessible walk in shower and high commode with DME.  Reviewed methods for managing leg during bed mobility.  Pt had just used bathroom and feels comfortable with this.  Min guard and cues given for UE placement for sit to stand     Vision     Perception     Praxis      Pertinent Vitals/Pain Pain Assessment: 0-10 Pain Score: 5  Pain Location: L knee Pain Descriptors / Indicators: Aching Pain Intervention(s): Limited activity within patient's tolerance;Monitored during session;Premedicated before session;Repositioned;Ice applied     Hand Dominance     Extremity/Trunk Assessment Upper Extremity Assessment Upper Extremity Assessment: Overall WFL for  tasks assessed           Communication Communication Communication: No difficulties   Cognition Arousal/Alertness: Awake/alert Behavior During Therapy: WFL for tasks assessed/performed Overall Cognitive Status: Within Functional Limits for tasks assessed                     General Comments       Exercises       Shoulder Instructions      Home Living Family/patient expects to be discharged to:: Private residence Living Arrangements: Spouse/significant other Available Help at Discharge: Family               Bathroom Shower/Tub: Walk-in shower (accessible)   Bathroom Toilet: Handicapped height     Home Equipment: Shower seat;Bedside commode   Additional Comments: husband has ALS; uses scooter. Son will be helping      Prior Functioning/Environment Level of Independence: Independent             OT Diagnosis: Generalized weakness   OT Problem List:     OT Treatment/Interventions:      OT Goals(Current goals can be found in the care  plan section)    OT Frequency:     Barriers to D/C:            Co-evaluation              End of Session    Activity Tolerance: Patient tolerated treatment well Patient left: in chair;with call bell/phone within reach;with family/visitor present   Time: 9528-4132 OT Time Calculation (min): 16 min Charges:  OT General Charges $OT Visit: 1 Procedure OT Evaluation $Initial OT Evaluation Tier I: 1 Procedure G-Codes:    Thula Stewart 07-18-14, 11:53 AM Lesle Chris, OTR/L [phone] 2014-07-18

## 2014-07-08 NOTE — Progress Notes (Signed)
     Subjective: 1 Day Post-Op Procedure(s) (LRB): LEFT TOTAL KNEE ARTHROPLASTY (Left)   Patient reports pain as mild, pain controlled. No events throughout the night. Talked about her hgb being 8.4, but she states that her rheumatologist says that it always runs a little low. She states that she is feeling good and ready to be discharged home.  Objective:   VITALS:   Filed Vitals:   07/08/14 0900  BP: 120/64  Pulse: 71  Temp: 98.2 F (36.8 C)  Resp: 15    Dorsiflexion/Plantar flexion intact Incision: dressing C/D/I No cellulitis present Compartment soft  LABS  Recent Labs  07/08/14 0432  HGB 8.4*  HCT 26.8*  WBC 6.9  PLT 252     Recent Labs  07/08/14 0432  NA 139  K 4.0  BUN 14  CREATININE 0.65  GLUCOSE 107*     Assessment/Plan: 1 Day Post-Op Procedure(s) (LRB): LEFT TOTAL KNEE ARTHROPLASTY (Left) Foley cath d/c'ed  Advance diet Up with therapy D/C IV fluids Discharge home with home health Follow up in 2 weeks at [facility] Follow up with OLIN,Radie Berges D in 2 weeks.  Contact information:  [facility] [address], [place] [phone]    Overweight (BMI 25-29.9)  Estimated body mass index is 25.39 kg/(m^2) as calculated from the following:   Height as of this encounter: 5\' 4"  (1.626 m).   Weight as of this encounter: 67.132 kg (148 lb). Patient also counseled that weight may inhibit the healing process Patient counseled that losing weight will help with future health issues       West Pugh. Jaline Pincock   PAC  07/08/2014, 9:04 AM

## 2014-07-08 NOTE — Care Management Note (Signed)
Case Management Note  Patient Details  Name: Haley Roberts MRN: [id number] Date of Birth: 09-02-1945  Subjective/Objective:                   LEFT TOTAL KNEE ARTHROPLASTY (Left)  Action/Plan: Discharge planning  Expected Discharge Date:  07/08/14               Expected Discharge Plan:  [place]  In-House Referral:     Discharge planning Services  CM Consult  Post Acute Care Choice:  Home Health Choice offered to:  Patient  DME Arranged:    DME Agency:     HH Arranged:  PT HH Agency:  [facility]  Status of Service:  Completed, signed off  Medicare Important Message Given:    Date Medicare IM Given:    Medicare IM give by:    Date Additional Medicare IM Given:    Additional Medicare Important Message give by:     If discussed at [place] of Stay Meetings, dates discussed:    Additional Comments: CM met with pt in room to offer choice of home health agency.  Pt chooses [facility] to render HHPT.  Address and contact information verified by pt.  Referral given to [employer], Tim.  No DME is needed as pt has both a rolling walker and 3n1 at home.  NO other CM needs were communicated. Dellie Catholic, RN 07/08/2014, 9:41 AM

## 2014-07-08 NOTE — Discharge Instructions (Signed)

## 2014-07-08 NOTE — Progress Notes (Signed)
Physical Therapy Treatment Patient Details Name: Haley Roberts MRN: [id number] DOB: Feb 03, 1945 Today's Date: 07-28-2014    History of Present Illness s/p L TKA    PT Comments    Pt progressing well with mobility and eager for dc today.  Follow Up Recommendations  Home health PT     Equipment Recommendations  None recommended by PT    Recommendations for Other Services OT consult     Precautions / Restrictions Precautions Precautions: Knee Restrictions Weight Bearing Restrictions: No Other Position/Activity Restrictions: WBAT    Mobility  Bed Mobility                  Transfers Overall transfer level: Needs assistance Equipment used: Rolling walker (2 wheeled) Transfers: Sit to/from Stand Sit to Stand: Supervision         General transfer comment: cues for UE placement  Ambulation/Gait Ambulation/Gait assistance: Min guard;Supervision Ambulation Distance (Feet): 142 Feet Assistive device: Rolling walker (2 wheeled) Gait Pattern/deviations: Step-to pattern;Step-through pattern;Decreased step length - right;Decreased step length - left;Shuffle;Trunk flexed Gait velocity: decr   General Gait Details: cues for sequence, posture and position from [employer]            Wheelchair Mobility    Modified Rankin (Stroke Patients Only)       Balance                                    Cognition Arousal/Alertness: Awake/alert Behavior During Therapy: WFL for tasks assessed/performed Overall Cognitive Status: Within Functional Limits for tasks assessed                      Exercises Total Joint Exercises Ankle Circles/Pumps: AROM;Supine;Both;15 reps Quad Sets: AROM;Both;10 reps;Supine    General Comments        Pertinent Vitals/Pain Pain Assessment: 0-10 Pain Score: 4  Pain Location: L knee Pain Descriptors / Indicators: Aching;Sore Pain Intervention(s): Limited activity within patient's tolerance;Monitored  during session;Premedicated before session    Home Living                      Prior Function            PT Goals (current goals can now be found in the care plan section) Acute Rehab PT Goals Patient Stated Goal: Resume previous lifestyle with decreased pain PT Goal Formulation: With patient Time For Goal Achievement: 07/11/14 Potential to Achieve Goals: Good Progress towards PT goals: Progressing toward goals    Frequency  7X/week    PT Plan Current plan remains appropriate    Co-evaluation             End of Session Equipment Utilized During Treatment: Gait belt Activity Tolerance: Patient tolerated treatment well Patient left: in chair;with call bell/phone within reach;with family/visitor present     Time: 9562-1308 PT Time Calculation (min) (ACUTE ONLY): 16 min  Charges:  $Gait Training: 8-22 mins                    G Codes:      Haley Roberts Jul 28, 2014, 4:34 PM

## 2014-07-08 NOTE — Progress Notes (Signed)
Pt to d/c home with [facility] home health. No DME needs. AVS reviewed and "My Chart" discussed with pt. Pt capable of verbalizing medications, signs and symptoms of infection, and follow-up appointments. Remains hemodynamically stable. No signs and symptoms of distress. Educated pt to return to ER in the case of SOB, dizziness, or chest pain.  

## 2014-07-08 NOTE — Evaluation (Signed)
Physical Therapy Evaluation Patient Details Name: AVERYANNA Roberts MRN: [id number] DOB: 1945/07/10 Today's Date: 07/08/2014   History of Present Illness  s/p L TKA  Clinical Impression  Pt s/p L TKR presents with decreased L LE strength/ROM and post op pain limiting functional mobility.  Pt should progress well to dc home with family and HHPT follow up.    Follow Up Recommendations Home health PT    Equipment Recommendations  None recommended by PT    Recommendations for Other Services OT consult     Precautions / Restrictions Precautions Precautions: Knee Restrictions Weight Bearing Restrictions: No Other Position/Activity Restrictions: WBAT      Mobility  Bed Mobility Overal bed mobility: Needs Assistance Bed Mobility: Supine to Sit     Supine to sit: Min guard     General bed mobility comments: cues for sequence and use of R LE to self assist  Transfers Overall transfer level: Needs assistance Equipment used: Rolling walker (2 wheeled) Transfers: Sit to/from Stand Sit to Stand: Min guard         General transfer comment: cues for UE placement  Ambulation/Gait Ambulation/Gait assistance: Min assist Ambulation Distance (Feet): 56 Feet Assistive device: Rolling walker (2 wheeled) Gait Pattern/deviations: Step-to pattern;Decreased step length - right;Decreased step length - left;Shuffle;Trunk flexed Gait velocity: decr Gait velocity interpretation: Below normal speed for age/gender General Gait Details: cues for sequence, posture and position from [employer]            Wheelchair Mobility    Modified Rankin (Stroke Patients Only)       Balance                                             Pertinent Vitals/Pain Pain Assessment: 0-10 Pain Score: 5  Pain Location: L knee Pain Descriptors / Indicators: Aching Pain Intervention(s): Limited activity within patient's tolerance;Monitored during session;Premedicated before  session;Repositioned;Ice applied    Home Living Family/patient expects to be discharged to:: Private residence Living Arrangements: Spouse/significant other Available Help at Discharge: Family Type of Home: House Home Access: Other (comment) (has lift - husband is disabled)     Home Layout: One level Home Equipment: Shower seat;Bedside commode Additional Comments: husband has ALS; uses scooter. Son will be helping    Prior Function Level of Independence: Independent               Hand Dominance        Extremity/Trunk Assessment   Upper Extremity Assessment: Overall WFL for tasks assessed           Lower Extremity Assessment: LLE deficits/detail   LLE Deficits / Details: 3-/5 quads with AAROM at knee -10-  45  Cervical / Trunk Assessment: Kyphotic  Communication   Communication: No difficulties  Cognition Arousal/Alertness: Awake/alert Behavior During Therapy: WFL for tasks assessed/performed Overall Cognitive Status: Within Functional Limits for tasks assessed                      General Comments      Exercises Total Joint Exercises Ankle Circles/Pumps: AROM;Supine;Both;15 reps Quad Sets: AROM;Both;10 reps;Supine Heel Slides: AAROM;Left;15 reps;Supine Straight Leg Raises: AAROM;AROM;Left;10 reps;Supine      Assessment/Plan    PT Assessment Patient needs continued PT services  PT Diagnosis Difficulty walking   PT Problem List Decreased strength;Decreased range of motion;Decreased activity tolerance;Decreased mobility;Decreased knowledge  of use of DME;Pain  PT Treatment Interventions DME instruction;Gait training;Stair training;Functional mobility training;Therapeutic activities;Therapeutic exercise;Patient/family education   PT Goals (Current goals can be found in the Care Plan section) Acute Rehab PT Goals Patient Stated Goal: Resume previous lifestyle with decreased pain PT Goal Formulation: With patient Time For Goal Achievement:  07/11/14 Potential to Achieve Goals: Good    Frequency 7X/week   Barriers to discharge        Co-evaluation               End of Session Equipment Utilized During Treatment: Gait belt Activity Tolerance: Patient tolerated treatment well Patient left: in chair;with call bell/phone within reach Nurse Communication: Mobility status         Time: 7543-6067 PT Time Calculation (min) (ACUTE ONLY): 30 min   Charges:   PT Evaluation $Initial PT Evaluation Tier I: 1 Procedure PT Treatments $Therapeutic Exercise: 8-22 mins   PT G Codes:        Fritz Cauthon 07-16-14, 12:11 PM

## 2014-07-10 ENCOUNTER — Telehealth: Payer: Self-pay

## 2014-07-10 DIAGNOSIS — M069 Rheumatoid arthritis, unspecified: Secondary | ICD-10-CM | POA: Diagnosis not present

## 2014-07-10 DIAGNOSIS — F329 Major depressive disorder, single episode, unspecified: Secondary | ICD-10-CM | POA: Diagnosis not present

## 2014-07-10 DIAGNOSIS — Z471 Aftercare following joint replacement surgery: Secondary | ICD-10-CM | POA: Diagnosis not present

## 2014-07-10 DIAGNOSIS — I10 Essential (primary) hypertension: Secondary | ICD-10-CM | POA: Diagnosis not present

## 2014-07-10 DIAGNOSIS — M1711 Unilateral primary osteoarthritis, right knee: Secondary | ICD-10-CM | POA: Diagnosis not present

## 2014-07-10 DIAGNOSIS — F419 Anxiety disorder, unspecified: Secondary | ICD-10-CM | POA: Diagnosis not present

## 2014-07-10 NOTE — Telephone Encounter (Signed)
Pt on TCM list. To follow with Ortho. Surgery for left TKA.

## 2014-07-13 ENCOUNTER — Other Ambulatory Visit: Payer: Self-pay

## 2014-07-13 DIAGNOSIS — M069 Rheumatoid arthritis, unspecified: Secondary | ICD-10-CM | POA: Diagnosis not present

## 2014-07-13 DIAGNOSIS — Z471 Aftercare following joint replacement surgery: Secondary | ICD-10-CM | POA: Diagnosis not present

## 2014-07-13 DIAGNOSIS — F329 Major depressive disorder, single episode, unspecified: Secondary | ICD-10-CM | POA: Diagnosis not present

## 2014-07-13 DIAGNOSIS — F419 Anxiety disorder, unspecified: Secondary | ICD-10-CM | POA: Diagnosis not present

## 2014-07-13 DIAGNOSIS — I10 Essential (primary) hypertension: Secondary | ICD-10-CM | POA: Diagnosis not present

## 2014-07-13 DIAGNOSIS — M1711 Unilateral primary osteoarthritis, right knee: Secondary | ICD-10-CM | POA: Diagnosis not present

## 2014-07-13 NOTE — Discharge Summary (Signed)
Physician Discharge Summary  Patient ID: Haley Roberts MRN: [id number] DOB/AGE: 1945-12-25 69 y.o.  Admit date: 07/07/2014 Discharge date: 07/08/2014   Procedures:  Procedure(s) (LRB): LEFT TOTAL KNEE ARTHROPLASTY (Left)  Attending Physician:  Dr. Paralee Cancel   Admission Diagnoses:   Left knee primary OA / pain  Discharge Diagnoses:  Principal Problem:   S/P left TKA Active Problems:   S/P knee replacement   Overweight (BMI 25.0-29.9)  Past Medical History  Diagnosis Date  . Hypertension   . Rheumatoid arthritis(714.0)   . Anxiety   . Depression   . Anemia   . Hyperglycemia   . Fibromyalgia   . Multiple thyroid nodules     gets ultra sound yearly on thryoid    HPI:    Haley Roberts, 69 y.o. female, has a history of pain and functional disability in the left knee due to arthritis and has failed non-surgical conservative treatments for greater than 12 weeks to include NSAID's and/or analgesics, corticosteriod injections and activity modification. Onset of symptoms was gradual, starting 4+ years ago with gradually worsening course since that time. The patient noted prior procedures on the knee to include arthroplasty on the right knee about 4 years ago by Dr. Alvan Dame. Patient currently rates pain in the left knee(s) at 9 out of 10 with activity. Patient has worsening of pain with activity and weight bearing, pain that interferes with activities of daily living, pain with passive range of motion, crepitus and joint swelling. Patient has evidence of periarticular osteophytes and joint space narrowing by imaging studies. There is no active infection. Risks, benefits and expectations were discussed with the patient. Risks including but not limited to the risk of anesthesia, blood clots, nerve damage, blood vessel damage, failure of the prosthesis, infection and up to and including death. Patient understand the risks, benefits and expectations and wishes to proceed with  surgery.   PCP: Unice Cobble, MD   Discharged Condition: good  Hospital Course:  Patient underwent the above stated procedure on 07/07/2014. Patient tolerated the procedure well and brought to the recovery room in good condition and subsequently to the floor.  POD #1 BP: 120/64 ; Pulse: 71 ; Temp: 98.2 F (36.8 C) ; Resp: 15 Patient reports pain as mild, pain controlled. No events throughout the night. Talked about her hgb being 8.4, but she states that her rheumatologist says that it always runs a little low. She states that she is feeling good and ready to be discharged home. Dorsiflexion/plantar flexion intact, incision: dressing C/D/I, no cellulitis present and compartment soft.   LABS  Basename    HGB  8.4  HCT  26.8    Discharge Exam: General appearance: alert, cooperative and no distress Extremities: Homans sign is negative, no sign of DVT, no edema, redness or tenderness in the calves or thighs and no ulcers, gangrene or trophic changes  Disposition: Home with follow up in 2 weeks   Follow-up Information    Follow up with Mauri Pole, MD. Schedule an appointment as soon as possible for a visit in 2 weeks.   Specialty:  Orthopedic Surgery   Contact information:   [address] [place] [address] [phone]       Follow up with [facility].   Why:  HOME HEALTH PHYSICAL THERAPY   Contact information:   [address] [place] [address] [phone]       Discharge Instructions    Call MD /  Call 911    Complete by:  As directed   If you experience chest pain or shortness of breath, CALL 911 and be transported to the hospital emergency room.  If you develope a fever above 101 F, pus (white drainage) or increased drainage or redness at the wound, or calf pain, call your surgeon's office.     Change dressing    Complete by:  As directed   Maintain surgical dressing until follow up in the clinic. If the edges start to  pull up, may reinforce with tape. If the dressing is no longer working, may remove and cover with gauze and tape, but must keep the area dry and clean.  Call with any questions or concerns.     Constipation Prevention    Complete by:  As directed   Drink plenty of fluids.  Prune juice may be helpful.  You may use a stool softener, such as Colace (over the counter) 100 mg twice a day.  Use MiraLax (over the counter) for constipation as needed.     Diet - low sodium heart healthy    Complete by:  As directed      Discharge instructions    Complete by:  As directed   Maintain surgical dressing until follow up in the clinic. If the edges start to pull up, may reinforce with tape. If the dressing is no longer working, may remove and cover with gauze and tape, but must keep the area dry and clean.  Follow up in 2 weeks at [facility]. Call with any questions or concerns.     Increase activity slowly as tolerated    Complete by:  As directed      TED hose    Complete by:  As directed   Use stockings (TED hose) for 2 weeks on both leg(s).  You may remove them at night for sleeping.     Weight bearing as tolerated    Complete by:  As directed   Laterality:  left  Extremity:  Lower             Medication List    STOP taking these medications        HYDROcodone-acetaminophen 5-325 MG per tablet  Commonly known as:  NORCO/VICODIN  Replaced by:  HYDROcodone-acetaminophen 7.5-325 MG per tablet      TAKE these medications        aspirin 325 MG EC tablet  Take 1 tablet (325 mg total) by mouth 2 (two) times daily.     buPROPion 300 MG 24 hr tablet  Commonly known as:  WELLBUTRIN XL  Take 300 mg by mouth daily.     BUTRANS 10 MCG/HR Ptwk patch  Generic drug:  buprenorphine  Place 10 mcg onto the skin once a week. Thursday     CALTRATE PLUS PO  Take 1 tablet by mouth 2 (two) times daily.     clorazepate 3.75 MG tablet  Commonly known as:  TRANXENE  Take 7.5-11.25 mg by  mouth 2 (two) times daily as needed for anxiety.     docusate sodium 100 MG capsule  Commonly known as:  COLACE  Take 1 capsule (100 mg total) by mouth 2 (two) times daily.     escitalopram 10 MG tablet  Commonly known as:  LEXAPRO  Take 10 mg by mouth daily. Dr.Kaur     ferrous sulfate 325 (65 FE) MG tablet  Take 1 tablet (325 mg total) by mouth 3 (three) times daily  after meals.     HYDROcodone-acetaminophen 7.5-325 MG per tablet  Commonly known as:  NORCO  Take 1-2 tablets by mouth every 4 (four) hours as needed for moderate pain.     losartan 100 MG tablet  Commonly known as:  COZAAR  TAKE 1 TABLET BY MOUTH EVERY DAY     methocarbamol 500 MG tablet  Commonly known as:  ROBAXIN  Take 1 tablet (500 mg total) by mouth every 6 (six) hours as needed for muscle spasms.     polyethylene glycol packet  Commonly known as:  MIRALAX / GLYCOLAX  Take 17 g by mouth 2 (two) times daily.     predniSONE 5 MG tablet  Commonly known as:  DELTASONE  Take 5 mg by mouth daily. Dr.Anderson     SUPER B COMPLEX PO  Take 1 tablet by mouth daily.     vitamin C 500 MG tablet  Commonly known as:  ASCORBIC ACID  Take 500 mg by mouth daily.     vitamin E 400 UNIT capsule  Take 400 Units by mouth daily.     WOMENS DAILY FORMULA Tabs  Take 1 tablet by mouth daily.         Signed: West Pugh. Haley Balch   PA-C  07/13/2014, 1:18 PM

## 2014-07-14 DIAGNOSIS — M069 Rheumatoid arthritis, unspecified: Secondary | ICD-10-CM | POA: Diagnosis not present

## 2014-07-14 DIAGNOSIS — M1711 Unilateral primary osteoarthritis, right knee: Secondary | ICD-10-CM | POA: Diagnosis not present

## 2014-07-14 DIAGNOSIS — Z471 Aftercare following joint replacement surgery: Secondary | ICD-10-CM | POA: Diagnosis not present

## 2014-07-14 DIAGNOSIS — F329 Major depressive disorder, single episode, unspecified: Secondary | ICD-10-CM | POA: Diagnosis not present

## 2014-07-14 DIAGNOSIS — I10 Essential (primary) hypertension: Secondary | ICD-10-CM | POA: Diagnosis not present

## 2014-07-14 DIAGNOSIS — F419 Anxiety disorder, unspecified: Secondary | ICD-10-CM | POA: Diagnosis not present

## 2014-07-15 DIAGNOSIS — I10 Essential (primary) hypertension: Secondary | ICD-10-CM | POA: Diagnosis not present

## 2014-07-15 DIAGNOSIS — Z471 Aftercare following joint replacement surgery: Secondary | ICD-10-CM | POA: Diagnosis not present

## 2014-07-15 DIAGNOSIS — F419 Anxiety disorder, unspecified: Secondary | ICD-10-CM | POA: Diagnosis not present

## 2014-07-15 DIAGNOSIS — M1711 Unilateral primary osteoarthritis, right knee: Secondary | ICD-10-CM | POA: Diagnosis not present

## 2014-07-15 DIAGNOSIS — M069 Rheumatoid arthritis, unspecified: Secondary | ICD-10-CM | POA: Diagnosis not present

## 2014-07-15 DIAGNOSIS — F329 Major depressive disorder, single episode, unspecified: Secondary | ICD-10-CM | POA: Diagnosis not present

## 2014-07-16 DIAGNOSIS — Z471 Aftercare following joint replacement surgery: Secondary | ICD-10-CM | POA: Diagnosis not present

## 2014-07-16 DIAGNOSIS — F329 Major depressive disorder, single episode, unspecified: Secondary | ICD-10-CM | POA: Diagnosis not present

## 2014-07-16 DIAGNOSIS — M1711 Unilateral primary osteoarthritis, right knee: Secondary | ICD-10-CM | POA: Diagnosis not present

## 2014-07-16 DIAGNOSIS — I10 Essential (primary) hypertension: Secondary | ICD-10-CM | POA: Diagnosis not present

## 2014-07-16 DIAGNOSIS — M069 Rheumatoid arthritis, unspecified: Secondary | ICD-10-CM | POA: Diagnosis not present

## 2014-07-16 DIAGNOSIS — F419 Anxiety disorder, unspecified: Secondary | ICD-10-CM | POA: Diagnosis not present

## 2014-07-21 DIAGNOSIS — Z471 Aftercare following joint replacement surgery: Secondary | ICD-10-CM | POA: Diagnosis not present

## 2014-07-21 DIAGNOSIS — Z96652 Presence of left artificial knee joint: Secondary | ICD-10-CM | POA: Diagnosis not present

## 2014-08-09 ENCOUNTER — Other Ambulatory Visit: Payer: Self-pay | Admitting: Internal Medicine

## 2014-08-10 ENCOUNTER — Other Ambulatory Visit: Payer: Self-pay | Admitting: Emergency Medicine

## 2014-08-10 MED ORDER — LOSARTAN POTASSIUM 100 MG PO TABS: 100.0000 mg | ORAL_TABLET | Freq: Every day | ORAL | Status: DC

## 2014-08-13 DIAGNOSIS — M25569 Pain in unspecified knee: Secondary | ICD-10-CM | POA: Diagnosis not present

## 2014-08-13 DIAGNOSIS — M069 Rheumatoid arthritis, unspecified: Secondary | ICD-10-CM | POA: Diagnosis not present

## 2014-08-13 DIAGNOSIS — M1990 Unspecified osteoarthritis, unspecified site: Secondary | ICD-10-CM | POA: Diagnosis not present

## 2014-08-13 DIAGNOSIS — M797 Fibromyalgia: Secondary | ICD-10-CM | POA: Diagnosis not present

## 2014-08-13 DIAGNOSIS — G894 Chronic pain syndrome: Secondary | ICD-10-CM | POA: Diagnosis not present

## 2014-08-13 DIAGNOSIS — Z79899 Other long term (current) drug therapy: Secondary | ICD-10-CM | POA: Diagnosis not present

## 2014-08-19 DIAGNOSIS — Z471 Aftercare following joint replacement surgery: Secondary | ICD-10-CM | POA: Diagnosis not present

## 2014-08-19 DIAGNOSIS — Z96652 Presence of left artificial knee joint: Secondary | ICD-10-CM | POA: Diagnosis not present

## 2014-10-13 DIAGNOSIS — M797 Fibromyalgia: Secondary | ICD-10-CM | POA: Diagnosis not present

## 2014-10-13 DIAGNOSIS — M150 Primary generalized (osteo)arthritis: Secondary | ICD-10-CM | POA: Diagnosis not present

## 2014-10-13 DIAGNOSIS — M0589 Other rheumatoid arthritis with rheumatoid factor of multiple sites: Secondary | ICD-10-CM | POA: Diagnosis not present

## 2014-10-22 DIAGNOSIS — M0589 Other rheumatoid arthritis with rheumatoid factor of multiple sites: Secondary | ICD-10-CM | POA: Diagnosis not present

## 2014-11-05 DIAGNOSIS — M0589 Other rheumatoid arthritis with rheumatoid factor of multiple sites: Secondary | ICD-10-CM | POA: Diagnosis not present

## 2014-11-19 DIAGNOSIS — M0589 Other rheumatoid arthritis with rheumatoid factor of multiple sites: Secondary | ICD-10-CM | POA: Diagnosis not present

## 2014-12-14 DIAGNOSIS — M797 Fibromyalgia: Secondary | ICD-10-CM | POA: Diagnosis not present

## 2014-12-14 DIAGNOSIS — M150 Primary generalized (osteo)arthritis: Secondary | ICD-10-CM | POA: Diagnosis not present

## 2014-12-14 DIAGNOSIS — M0589 Other rheumatoid arthritis with rheumatoid factor of multiple sites: Secondary | ICD-10-CM | POA: Diagnosis not present

## 2014-12-17 DIAGNOSIS — M0589 Other rheumatoid arthritis with rheumatoid factor of multiple sites: Secondary | ICD-10-CM | POA: Diagnosis not present

## 2015-01-15 ENCOUNTER — Telehealth: Payer: Self-pay | Admitting: Internal Medicine

## 2015-01-15 NOTE — Telephone Encounter (Signed)
PLEASE NOTE: All timestamps contained within this report are represented as [place] Standard Time. CONFIDENTIALTY NOTICE: This fax transmission is intended only for the addressee. It contains information that is legally privileged, confidential or otherwise protected from use or disclosure. If you are not the intended recipient, you are strictly prohibited from reviewing, disclosing, copying using or disseminating any of this information or taking any action in reliance on or regarding this information. If you have received this fax in error, please notify us immediately by telephone so that we can arrange for its return to [place]. Phone: [phone], Toll-Free: [phone], Fax: [phone] Page: 1 of 1 Call Id: [id number] [place] [facility] Patient Name: Haley Roberts DOB: [birth year] Initial Comment Chart 2/2: Caller states she has head cold. Sore throat and cough. Nurse Assessment Nurse: Germain Osgood RN, Opal Sidles Date/Time Eilene Ghazi Time): 01/15/2015 9:59:07 AM Confirm and document reason for call. If symptomatic, describe symptoms. ---Caller reports she has cough, sinus congestion and sore throat. No fever Throat is raspy Has the patient traveled out of the country within the last 30 days? ---No Does the patient have any new or worsening symptoms? ---Yes Will a triage be completed? ---Yes Related visit to physician within the last 2 weeks? ---No Does the PT have any chronic conditions? (i.e. diabetes, asthma, etc.) ---Yes List chronic conditions. ---Hypertension, Rheumatoid Arthritis Fibromyalgia Is this a behavioral health or substance abuse call? ---No Guidelines Guideline Title Affirmed Question Affirmed Notes Common Cold [1] SEVERE sore throat AND [2] present > 24 hours Final Disposition User See Physician within 24 Hours Germain Osgood, RN, [name] Referrals REFERRED TO PCP OFFICE [facility] Saturday  Clinic Disagree/Comply: Comply

## 2015-01-16 ENCOUNTER — Encounter: Payer: Self-pay | Admitting: Family Medicine

## 2015-01-16 ENCOUNTER — Ambulatory Visit (INDEPENDENT_AMBULATORY_CARE_PROVIDER_SITE_OTHER): Payer: Medicare Other | Admitting: Family Medicine

## 2015-01-16 VITALS — BP 124/100 | HR 70 | Temp 98.8°F | Wt 160.0 lb

## 2015-01-16 DIAGNOSIS — J069 Acute upper respiratory infection, unspecified: Secondary | ICD-10-CM | POA: Diagnosis not present

## 2015-01-16 DIAGNOSIS — B9789 Other viral agents as the cause of diseases classified elsewhere: Principal | ICD-10-CM

## 2015-01-16 MED ORDER — AZITHROMYCIN 250 MG PO TABS: ORAL_TABLET | ORAL | Status: DC

## 2015-01-16 NOTE — Patient Instructions (Signed)
You have a viral upper respiratory infection. Antibiotics are not needed for this.  Viral infections usually take 7-10 days to resolve.  The cough can last a few weeks to go away. Take ibuprofen 400-600mg  twice daily with food.  Push fluids and plenty of rest. Continue mucinex, may add delsym for cough as needed. Use medication as prescribed: if worsening or not improving as expected, fill zpack antibiotic provided today. Call clinic with questions.  Good to see you today. I hope you start feeling better soon.

## 2015-01-16 NOTE — Assessment & Plan Note (Signed)
Anticipate viral - discussed supportive care. See pt instructions for plan. WASP for zpack provided with instructions on when to fill.  Pt agrees with plan.

## 2015-01-16 NOTE — Progress Notes (Signed)
BP 124/100 mmHg  Pulse 70  Temp(Src) 98.8 F (37.1 C) (Oral)  Wt 160 lb (72.576 kg)  SpO2 97%   CC: nasal congestion  Subjective:    Patient ID: [name], female    DOB: Jun 15, 1945, 69 y.o.   MRN: [id number]  HPI: DAYANIS YUILLE is a 69 y.o. female presenting on 01/16/2015 for Nasal Congestion   6d h/o sudden onset head congestion, sore throat, dry cough with chest congestion, some PNDrainage. Sleeping ok.   No fevers/chills, ear or tooth pain, abd pain, chest pain, dyspnea or wheezing. No headaches.  Taking mucinex.  No smokers at home.  No h/o asthma.  Husband with ALS sick at home as well.  Relevant past medical, surgical, family and social history reviewed and updated as indicated. Interim medical history since our last visit reviewed. Allergies and medications reviewed and updated. Current Outpatient Prescriptions on File Prior to Visit  Medication Sig  . B Complex-C (SUPER B COMPLEX PO) Take 1 tablet by mouth daily.  . buprenorphine (BUTRANS) 10 MCG/HR PTWK patch Place 10 mcg onto the skin once a week. Thursday  . buPROPion (WELLBUTRIN XL) 300 MG 24 hr tablet Take 300 mg by mouth daily.  . Calcium Carbonate-Vit D-Min (CALTRATE PLUS PO) Take 1 tablet by mouth 2 (two) times daily.    . clorazepate (TRANXENE) 3.75 MG tablet Take 7.5-11.25 mg by mouth 2 (two) times daily as needed for anxiety.   . docusate sodium (COLACE) 100 MG capsule Take 1 capsule (100 mg total) by mouth 2 (two) times daily.  Marland Kitchen escitalopram (LEXAPRO) 10 MG tablet Take 10 mg by mouth daily. Dr.Kaur  . ferrous sulfate 325 (65 FE) MG tablet Take 1 tablet (325 mg total) by mouth 3 (three) times daily after meals.  Marland Kitchen losartan (COZAAR) 100 MG tablet Take 1 tablet (100 mg total) by mouth daily.  . methocarbamol (ROBAXIN) 500 MG tablet Take 1 tablet (500 mg total) by mouth every 6 (six) hours as needed for muscle spasms.  . Multiple Vitamins-Minerals (WOMENS DAILY FORMULA) TABS Take 1 tablet by  mouth daily.    . polyethylene glycol (MIRALAX / GLYCOLAX) packet Take 17 g by mouth 2 (two) times daily.  . predniSONE (DELTASONE) 5 MG tablet Take 5 mg by mouth daily. Dr.Anderson  . vitamin C (ASCORBIC ACID) 500 MG tablet Take 500 mg by mouth daily.    . vitamin E 400 UNIT capsule Take 400 Units by mouth daily.     No current facility-administered medications on file prior to visit.    Review of Systems Per HPI unless specifically indicated in ROS section     Objective:    BP 124/100 mmHg  Pulse 70  Temp(Src) 98.8 F (37.1 C) (Oral)  Wt 160 lb (72.576 kg)  SpO2 97%  Wt Readings from Last 3 Encounters:  01/16/15 160 lb (72.576 kg)  07/07/14 148 lb (67.132 kg)  06/11/14 152 lb (68.947 kg)    Physical Exam  Constitutional: She appears well-developed and well-nourished. No distress.  HENT:  Head: Normocephalic and atraumatic.  Right Ear: Hearing, tympanic membrane, external ear and ear canal normal.  Left Ear: Hearing, tympanic membrane, external ear and ear canal normal.  Nose: Mucosal edema (nasal mucosal inflammation) present. No rhinorrhea. Right sinus exhibits no maxillary sinus tenderness and no frontal sinus tenderness. Left sinus exhibits no maxillary sinus tenderness and no frontal sinus tenderness.  Mouth/Throat: Uvula is midline and mucous membranes are normal. Posterior oropharyngeal erythema present.  No oropharyngeal exudate, posterior oropharyngeal edema or tonsillar abscesses.  Eyes: Conjunctivae and EOM are normal. Pupils are equal, round, and reactive to light. No scleral icterus.  Neck: Normal range of motion. Neck supple.  Cardiovascular: Normal rate, regular rhythm, normal heart sounds and intact distal pulses.   No murmur heard. Pulmonary/Chest: Effort normal and breath sounds normal. No respiratory distress. She has no wheezes. She has no rales.  Lymphadenopathy:    She has no cervical adenopathy.  Skin: Skin is warm and dry. No rash noted.  Nursing note  and vitals reviewed.     Assessment & Plan:   Problem List Items Addressed This Visit    Viral URI with cough - Primary    Anticipate viral - discussed supportive care. See pt instructions for plan. WASP for zpack provided with instructions on when to fill.  Pt agrees with plan.      Relevant Medications   azithromycin (ZITHROMAX) 250 MG tablet       Follow up plan: Return if symptoms worsen or fail to improve.

## 2015-01-16 NOTE — Progress Notes (Signed)
Pre visit review using our clinic review tool, if applicable. No additional management support is needed unless otherwise documented below in the visit note. 

## 2015-01-29 DIAGNOSIS — M0589 Other rheumatoid arthritis with rheumatoid factor of multiple sites: Secondary | ICD-10-CM | POA: Diagnosis not present

## 2015-02-15 ENCOUNTER — Other Ambulatory Visit: Payer: Self-pay | Admitting: Internal Medicine

## 2015-02-26 DIAGNOSIS — M0589 Other rheumatoid arthritis with rheumatoid factor of multiple sites: Secondary | ICD-10-CM | POA: Diagnosis not present

## 2015-03-14 ENCOUNTER — Other Ambulatory Visit: Payer: Self-pay | Admitting: Internal Medicine

## 2015-03-26 ENCOUNTER — Telehealth: Payer: Self-pay | Admitting: Internal Medicine

## 2015-03-26 MED ORDER — LOSARTAN POTASSIUM 100 MG PO TABS: ORAL_TABLET | ORAL | Status: DC

## 2015-03-26 NOTE — Telephone Encounter (Signed)
Ok to send in.  

## 2015-03-26 NOTE — Telephone Encounter (Signed)
Patient has had to reschedule her appt. appt is with her husband on 05/28/2015. She is in need of losartan (COZAAR) 100 MG tablet [id number] as of April. She is asking that you fill until her may appt.

## 2015-03-29 ENCOUNTER — Ambulatory Visit: Payer: Self-pay | Admitting: Internal Medicine

## 2015-04-08 DIAGNOSIS — M150 Primary generalized (osteo)arthritis: Secondary | ICD-10-CM | POA: Diagnosis not present

## 2015-04-08 DIAGNOSIS — M0589 Other rheumatoid arthritis with rheumatoid factor of multiple sites: Secondary | ICD-10-CM | POA: Diagnosis not present

## 2015-04-08 DIAGNOSIS — M7989 Other specified soft tissue disorders: Secondary | ICD-10-CM | POA: Diagnosis not present

## 2015-04-08 DIAGNOSIS — M797 Fibromyalgia: Secondary | ICD-10-CM | POA: Diagnosis not present

## 2015-04-15 DIAGNOSIS — M0589 Other rheumatoid arthritis with rheumatoid factor of multiple sites: Secondary | ICD-10-CM | POA: Diagnosis not present

## 2015-05-17 DIAGNOSIS — D3709 Neoplasm of uncertain behavior of other specified sites of the oral cavity: Secondary | ICD-10-CM | POA: Diagnosis not present

## 2015-05-19 DIAGNOSIS — M0589 Other rheumatoid arthritis with rheumatoid factor of multiple sites: Secondary | ICD-10-CM | POA: Diagnosis not present

## 2015-05-28 ENCOUNTER — Other Ambulatory Visit (INDEPENDENT_AMBULATORY_CARE_PROVIDER_SITE_OTHER): Payer: Medicare Other

## 2015-05-28 ENCOUNTER — Ambulatory Visit (INDEPENDENT_AMBULATORY_CARE_PROVIDER_SITE_OTHER): Payer: Medicare Other | Admitting: Internal Medicine

## 2015-05-28 ENCOUNTER — Encounter: Payer: Self-pay | Admitting: Internal Medicine

## 2015-05-28 VITALS — BP 154/96 | HR 75 | Temp 99.1°F | Resp 16 | Wt 163.0 lb

## 2015-05-28 DIAGNOSIS — Z23 Encounter for immunization: Secondary | ICD-10-CM | POA: Diagnosis not present

## 2015-05-28 DIAGNOSIS — D649 Anemia, unspecified: Secondary | ICD-10-CM | POA: Diagnosis not present

## 2015-05-28 DIAGNOSIS — M797 Fibromyalgia: Secondary | ICD-10-CM

## 2015-05-28 DIAGNOSIS — E042 Nontoxic multinodular goiter: Secondary | ICD-10-CM

## 2015-05-28 DIAGNOSIS — I10 Essential (primary) hypertension: Secondary | ICD-10-CM

## 2015-05-28 DIAGNOSIS — E049 Nontoxic goiter, unspecified: Secondary | ICD-10-CM

## 2015-05-28 DIAGNOSIS — F329 Major depressive disorder, single episode, unspecified: Secondary | ICD-10-CM

## 2015-05-28 DIAGNOSIS — F32A Depression, unspecified: Secondary | ICD-10-CM

## 2015-05-28 DIAGNOSIS — M069 Rheumatoid arthritis, unspecified: Secondary | ICD-10-CM

## 2015-05-28 LAB — CBC WITH DIFFERENTIAL/PLATELET
BASOS ABS: 0.1 10*3/uL (ref 0.0–0.1)
Basophils Relative: 0.8 % (ref 0.0–3.0)
EOS ABS: 0.1 10*3/uL (ref 0.0–0.7)
Eosinophils Relative: 1.7 % (ref 0.0–5.0)
HCT: 34.2 % — ABNORMAL LOW (ref 36.0–46.0)
HEMOGLOBIN: 11.6 g/dL — AB (ref 12.0–15.0)
Lymphocytes Relative: 20.4 % (ref 12.0–46.0)
Lymphs Abs: 1.2 10*3/uL (ref 0.7–4.0)
MCHC: 33.9 g/dL (ref 30.0–36.0)
MCV: 91.4 fl (ref 78.0–100.0)
MONO ABS: 0.5 10*3/uL (ref 0.1–1.0)
Monocytes Relative: 8.7 % (ref 3.0–12.0)
Neutro Abs: 4.1 10*3/uL (ref 1.4–7.7)
Neutrophils Relative %: 68.4 % (ref 43.0–77.0)
Platelets: 370 10*3/uL (ref 150.0–400.0)
RBC: 3.74 Mil/uL — AB (ref 3.87–5.11)
RDW: 14.3 % (ref 11.5–15.5)
WBC: 6 10*3/uL (ref 4.0–10.5)

## 2015-05-28 LAB — COMPREHENSIVE METABOLIC PANEL
ALK PHOS: 102 U/L (ref 39–117)
ALT: 25 U/L (ref 0–35)
AST: 34 U/L (ref 0–37)
Albumin: 4.5 g/dL (ref 3.5–5.2)
BILIRUBIN TOTAL: 0.3 mg/dL (ref 0.2–1.2)
BUN: 14 mg/dL (ref 6–23)
CO2: 34 mEq/L — ABNORMAL HIGH (ref 19–32)
CREATININE: 0.89 mg/dL (ref 0.40–1.20)
Calcium: 9.9 mg/dL (ref 8.4–10.5)
Chloride: 103 mEq/L (ref 96–112)
GFR: 67.1 mL/min (ref >60.00)
Glucose, Bld: 94 mg/dL (ref 70–99)
POTASSIUM: 5 meq/L (ref 3.5–5.1)
SODIUM: 140 meq/L (ref 135–145)
TOTAL PROTEIN: 8.2 g/dL (ref 6.0–8.3)

## 2015-05-28 LAB — TSH: TSH: 1.72 u[IU]/mL (ref 0.35–4.50)

## 2015-05-28 NOTE — Progress Notes (Signed)
Pre visit review using our clinic review tool, if applicable. No additional management support is needed unless otherwise documented below in the visit note. 

## 2015-05-28 NOTE — Assessment & Plan Note (Signed)
Following with psychiatry Management of medications per psychiatry

## 2015-05-28 NOTE — Assessment & Plan Note (Signed)
Following with Dr Janace Hoard per him

## 2015-05-28 NOTE — Assessment & Plan Note (Addendum)
BP elevated here today-secondary to whitecoat hypertension Advised her that she needs to monitor her blood pressure at home regularly to make sure it is well-controlled Continue losartan 100 mg daily Check CMP

## 2015-05-28 NOTE — Progress Notes (Signed)
Subjective:    Patient ID: Haley Roberts, female    DOB: 14-Jul-1945, 70 y.o.   MRN: [id number]  HPI She is here to establish with a new pcp.  She is here for follow up.  Hypertension: She is taking her medication daily. She is compliant with a low sodium diet.  She denies chest pain, palpitations, edema, shortness of breath and regular headaches. She is not exercising regularly.  She does not monitor her blood pressure at home.    Fibromyalgia, RA, OA:  She is following with rheumatology. She is on a few medications for her rheumatoid arthritis, but still has pain. She also has osteoarthritis. She has tried gabapentin for fibromyalgia, but did not feel that this worked. She did discuss trying Cymbalta with her rheumatologist and may consider that.  Depression: She is following with psychiatry who manages her medication. She is taking Wellbutrin and Lexapro daily as prescribed. She does find the medication helpful.  Goiter, multinodular: She denies any obvious enlargement in her thyroid. She denies any difficulty swallowing.  Lower back pain:  She has a disc in her lower back that has been"destroyed" by her arthritis and she has the same problem in her neck.  She has neck and lower back pain.  She may need to have surgery  Medications and allergies reviewed with patient and updated if appropriate.  Patient Active Problem List   Diagnosis Date Noted  . Overweight (BMI 25.0-29.9) 07/08/2014  . S/P left TKA 07/07/2014  . S/P knee replacement 07/07/2014  . Right knee DJD 08/29/2010  . Rheumatoid arthritis ([place]) 03/30/2010  . PAIN IN JOINT, MULTIPLE SITES 01/14/2009  . Osteoarthritis 01/12/2009  . UNSPECIFIED NONTOXIC NODULAR GOITER 08/22/2007  . Essential hypertension 08/22/2007  . HYPERGLYCEMIA, FASTING 08/22/2007  . ANEMIA-NOS 05/31/2006  . Depression 05/31/2006  . ABNORMAL THYROID FUNCTION TESTS 05/31/2006    Current Outpatient Prescriptions on File Prior to Visit    Medication Sig Dispense Refill  . buPROPion (WELLBUTRIN XL) 300 MG 24 hr tablet Take 150 mg by mouth daily.     . Calcium Carbonate-Vit D-Min (CALTRATE PLUS PO) Take 1 tablet by mouth 2 (two) times daily.      . clorazepate (TRANXENE) 3.75 MG tablet Take 7.5-11.25 mg by mouth 2 (two) times daily as needed for anxiety.     Marland Kitchen escitalopram (LEXAPRO) 10 MG tablet Take 10 mg by mouth daily. Dr.Kaur    . ferrous sulfate 325 (65 FE) MG tablet Take 1 tablet (325 mg total) by mouth 3 (three) times daily after meals.  3  . HYDROcodone-acetaminophen (NORCO/VICODIN) 5-325 MG tablet TK 1-2 TS PO PRN Q 6 H  0  . losartan (COZAAR) 100 MG tablet TAKE 1 TABLET(100 MG) BY MOUTH DAILY 30 tablet 1  . methocarbamol (ROBAXIN) 500 MG tablet Take 1 tablet (500 mg total) by mouth every 6 (six) hours as needed for muscle spasms. 50 tablet 0  . Multiple Vitamins-Minerals (WOMENS DAILY FORMULA) TABS Take 1 tablet by mouth daily.      . predniSONE (DELTASONE) 5 MG tablet Take 5 mg by mouth daily. Dr.Anderson    . vitamin C (ASCORBIC ACID) 500 MG tablet Take 500 mg by mouth daily.      . vitamin E 400 UNIT capsule Take 400 Units by mouth daily.       No current facility-administered medications on file prior to visit.    Past Medical History  Diagnosis Date  . Hypertension   . Rheumatoid  arthritis(714.0)   . Anxiety   . Depression   . Anemia   . Hyperglycemia   . Fibromyalgia   . Multiple thyroid nodules     gets ultra sound yearly on thryoid    Past Surgical History  Procedure Laterality Date  . Abdominal hysterectomy    . Breast biopsy    . Joint replacement      right knee replacement 2012  . Total knee arthroplasty Left 07/07/2014    Procedure: LEFT TOTAL KNEE ARTHROPLASTY;  Surgeon: Paralee Cancel, MD;  Location: [facility];  Service: Orthopedics;  Laterality: Left;    Social History   Social History  . Marital Status: Married    Spouse Name: N/A  . Number of Children: N/A  . Years of Education: N/A    Social History Main Topics  . Smoking status: Never Smoker   . Smokeless tobacco: Never Used  . Alcohol Use: No  . Drug Use: No  . Sexual Activity: Not on file   Other Topics Concern  . Not on file   Social History Narrative   Reg exercise          Family History  Problem Relation Age of Onset  . Cancer Mother     breast  . Arthritis Father   . Hypertension Father   . Diabetes Sister   . Hypertension Brother   . Diabetes Son   . Depression Son     Review of Systems  Constitutional: Negative for fever and chills.  HENT: Negative for trouble swallowing.   Respiratory: Negative for cough, shortness of breath and wheezing.   Cardiovascular: Negative for chest pain, palpitations and leg swelling.  Gastrointestinal: Negative for nausea and abdominal pain.       Occasional with certain foods  Musculoskeletal: Positive for myalgias, back pain, joint swelling, arthralgias and neck pain.  Neurological: Negative for light-headedness and headaches.       Objective:   Filed Vitals:   05/28/15 1312  BP: 154/96  Pulse: 75  Temp: 99.1 F (37.3 C)  Resp: 16   Filed Weights   05/28/15 1312  Weight: 163 lb (73.936 kg)   Body mass index is 27.97 kg/(m^2).   Physical Exam Constitutional: Appears well-developed and well-nourished. No distress.  Neck: Neck supple. No tracheal deviation present. No thyromegaly present.  No carotid bruit. No cervical adenopathy.   Cardiovascular: Normal rate, regular rhythm and normal heart sounds.   No murmur heard.  No edema Pulmonary/Chest: Effort normal and breath sounds normal. No respiratory distress. No wheezes.  Msk: deformities in hands      Assessment & Plan:    See Problem List for Assessment and Plan of chronic medical problems.    Follow up annually

## 2015-05-28 NOTE — Patient Instructions (Addendum)
Monitor your BP at home and make sure it is controlled.   Test(s) ordered today. Your results will be released to [facility] (or called to you) after review, usually within 72hours after test completion. If any changes need to be made, you will be notified at that same time.  All other Health Maintenance issues reviewed.   All recommended immunizations and age-appropriate screenings are up-to-date or discussed.  prevnar vaccine administered today.   Medications reviewed and updated.  No changes recommended at this time.   Please followup in annually

## 2015-05-28 NOTE — Assessment & Plan Note (Signed)
She will consider trying Cymbalta, which her psychiatrist will prescribe

## 2015-05-28 NOTE — Assessment & Plan Note (Addendum)
Has been stable, will check [place] - if stable will not recheck Check TSH

## 2015-05-29 ENCOUNTER — Encounter: Payer: Self-pay | Admitting: Internal Medicine

## 2015-05-31 ENCOUNTER — Other Ambulatory Visit: Payer: Self-pay

## 2015-06-07 ENCOUNTER — Ambulatory Visit
Admission: RE | Admit: 2015-06-07 | Discharge: 2015-06-07 | Disposition: A | Discharge status: Home / Self Care | Payer: Medicare Other | Source: Ambulatory Visit | Attending: Internal Medicine | Admitting: Internal Medicine

## 2015-06-07 DIAGNOSIS — E049 Nontoxic goiter, unspecified: Secondary | ICD-10-CM

## 2015-06-07 DIAGNOSIS — E042 Nontoxic multinodular goiter: Secondary | ICD-10-CM | POA: Diagnosis not present

## 2015-06-10 ENCOUNTER — Other Ambulatory Visit: Payer: Self-pay | Admitting: Internal Medicine

## 2015-06-10 DIAGNOSIS — E042 Nontoxic multinodular goiter: Secondary | ICD-10-CM

## 2015-06-17 ENCOUNTER — Other Ambulatory Visit: Payer: Self-pay | Admitting: Internal Medicine

## 2015-06-23 DIAGNOSIS — Z79899 Other long term (current) drug therapy: Secondary | ICD-10-CM | POA: Diagnosis not present

## 2015-06-23 DIAGNOSIS — M0589 Other rheumatoid arthritis with rheumatoid factor of multiple sites: Secondary | ICD-10-CM | POA: Diagnosis not present

## 2015-06-25 ENCOUNTER — Encounter: Payer: Self-pay | Admitting: Internal Medicine

## 2015-07-07 DIAGNOSIS — Z471 Aftercare following joint replacement surgery: Secondary | ICD-10-CM | POA: Diagnosis not present

## 2015-07-07 DIAGNOSIS — Z96652 Presence of left artificial knee joint: Secondary | ICD-10-CM | POA: Diagnosis not present

## 2015-07-08 DIAGNOSIS — M150 Primary generalized (osteo)arthritis: Secondary | ICD-10-CM | POA: Diagnosis not present

## 2015-07-08 DIAGNOSIS — M0589 Other rheumatoid arthritis with rheumatoid factor of multiple sites: Secondary | ICD-10-CM | POA: Diagnosis not present

## 2015-07-08 DIAGNOSIS — M797 Fibromyalgia: Secondary | ICD-10-CM | POA: Diagnosis not present

## 2015-07-21 DIAGNOSIS — E042 Nontoxic multinodular goiter: Secondary | ICD-10-CM | POA: Diagnosis not present

## 2015-07-27 ENCOUNTER — Other Ambulatory Visit: Payer: Self-pay | Admitting: Physician Assistant

## 2015-07-27 DIAGNOSIS — E042 Nontoxic multinodular goiter: Secondary | ICD-10-CM

## 2015-07-29 DIAGNOSIS — M0589 Other rheumatoid arthritis with rheumatoid factor of multiple sites: Secondary | ICD-10-CM | POA: Diagnosis not present

## 2015-08-12 ENCOUNTER — Inpatient Hospital Stay: Admission: RE | Admit: 2015-08-12 | Payer: Self-pay | Source: Ambulatory Visit

## 2015-08-18 ENCOUNTER — Other Ambulatory Visit (HOSPITAL_COMMUNITY)
Admission: RE | Admit: 2015-08-18 | Discharge: 2015-08-18 | Disposition: A | Discharge status: Home / Self Care | Payer: Medicare Other | Source: Ambulatory Visit | Attending: Radiology | Admitting: Radiology

## 2015-08-18 ENCOUNTER — Ambulatory Visit
Admission: RE | Admit: 2015-08-18 | Discharge: 2015-08-18 | Disposition: A | Discharge status: Home / Self Care | Payer: Medicare Other | Source: Ambulatory Visit | Attending: Physician Assistant | Admitting: Physician Assistant

## 2015-08-18 DIAGNOSIS — E041 Nontoxic single thyroid nodule: Secondary | ICD-10-CM | POA: Diagnosis not present

## 2015-08-18 DIAGNOSIS — E042 Nontoxic multinodular goiter: Secondary | ICD-10-CM

## 2015-08-30 DIAGNOSIS — M0589 Other rheumatoid arthritis with rheumatoid factor of multiple sites: Secondary | ICD-10-CM | POA: Diagnosis not present

## 2015-09-02 DIAGNOSIS — E042 Nontoxic multinodular goiter: Secondary | ICD-10-CM | POA: Diagnosis not present

## 2015-10-04 DIAGNOSIS — M0589 Other rheumatoid arthritis with rheumatoid factor of multiple sites: Secondary | ICD-10-CM | POA: Diagnosis not present

## 2015-10-11 DIAGNOSIS — M0589 Other rheumatoid arthritis with rheumatoid factor of multiple sites: Secondary | ICD-10-CM | POA: Diagnosis not present

## 2015-10-11 DIAGNOSIS — M797 Fibromyalgia: Secondary | ICD-10-CM | POA: Diagnosis not present

## 2015-10-11 DIAGNOSIS — M150 Primary generalized (osteo)arthritis: Secondary | ICD-10-CM | POA: Diagnosis not present

## 2015-11-01 DIAGNOSIS — M0589 Other rheumatoid arthritis with rheumatoid factor of multiple sites: Secondary | ICD-10-CM | POA: Diagnosis not present

## 2015-11-29 DIAGNOSIS — M0589 Other rheumatoid arthritis with rheumatoid factor of multiple sites: Secondary | ICD-10-CM | POA: Diagnosis not present

## 2015-12-21 ENCOUNTER — Encounter: Payer: Self-pay | Admitting: Nurse Practitioner

## 2015-12-21 ENCOUNTER — Ambulatory Visit (INDEPENDENT_AMBULATORY_CARE_PROVIDER_SITE_OTHER): Payer: Medicare Other | Admitting: Nurse Practitioner

## 2015-12-21 VITALS — BP 132/88 | HR 79 | Temp 98.4°F | Ht 63.0 in | Wt 173.0 lb

## 2015-12-21 DIAGNOSIS — D849 Immunodeficiency, unspecified: Secondary | ICD-10-CM

## 2015-12-21 DIAGNOSIS — J0140 Acute pansinusitis, unspecified: Secondary | ICD-10-CM | POA: Diagnosis not present

## 2015-12-21 DIAGNOSIS — D899 Disorder involving the immune mechanism, unspecified: Secondary | ICD-10-CM

## 2015-12-21 MED ORDER — GUAIFENESIN ER 600 MG PO TB12: 600.0000 mg | ORAL_TABLET | Freq: Two times a day (BID) | ORAL | 0 refills | Status: DC | PRN

## 2015-12-21 MED ORDER — BENZONATATE 100 MG PO CAPS: 100.0000 mg | ORAL_CAPSULE | Freq: Three times a day (TID) | ORAL | 0 refills | Status: DC | PRN

## 2015-12-21 MED ORDER — AMOXICILLIN-POT CLAVULANATE 875-125 MG PO TABS: 1.0000 | ORAL_TABLET | Freq: Two times a day (BID) | ORAL | 0 refills | Status: DC

## 2015-12-21 NOTE — Progress Notes (Signed)
Pre visit review using our clinic review tool, if applicable. No additional management support is needed unless otherwise documented below in the visit note. 

## 2015-12-21 NOTE — Patient Instructions (Signed)
Return to office if no improvement in1week.  URI Instructions: Flonase and Afrin use: apply 1spray of afrin in each nare, wait 18mins, then apply 2sprays of flonase in each nare. Use both nasal spray consecutively x 3days, then flonase only for at least 14days.  Encourage adequate oral hydration.  Use over-the-counter  "cold" medicines  such as "Tylenol cold" , "Advil cold",  "Mucinex" or" Mucinex D"  for cough and congestion.  Avoid decongestants if you have high blood pressure. Use" Delsym" or" Robitussin" cough syrup varietis for cough.  You can use plain "Tylenol" or "Advi"l for fever, chills and achyness.

## 2015-12-21 NOTE — Progress Notes (Signed)
Subjective:  Patient ID: Haley Roberts, female    DOB: [birth year]  Age: 70 y.o. MRN: [id number]  CC: Nasal Congestion (head cold,congestion with yellow mucus. took mucinex and delsym. )   URI   This is a new problem. The current episode started 1 to 4 weeks ago. The problem has been gradually worsening. Associated symptoms include congestion, coughing, headaches, a plugged ear sensation, rhinorrhea, sinus pain, a sore throat and swollen glands. Pertinent negatives include no chest pain. She has tried increased fluids, acetaminophen and NSAIDs for the symptoms. The treatment provided no relief.    Outpatient Medications Prior to Visit  Medication Sig Dispense Refill  . buPROPion (WELLBUTRIN XL) 300 MG 24 hr tablet Take 150 mg by mouth daily.     . Calcium Carbonate-Vit D-Min (CALTRATE PLUS PO) Take 1 tablet by mouth 2 (two) times daily.      . clorazepate (TRANXENE) 3.75 MG tablet Take 7.5-11.25 mg by mouth 2 (two) times daily as needed for anxiety.     Marland Kitchen escitalopram (LEXAPRO) 10 MG tablet Take 10 mg by mouth daily. Dr.Kaur    . HYDROcodone-acetaminophen (NORCO/VICODIN) 5-325 MG tablet TK 1-2 TS PO PRN Q 6 H  0  . losartan (COZAAR) 100 MG tablet TAKE 1 TABLET(100 MG) BY MOUTH DAILY 30 tablet 5  . methocarbamol (ROBAXIN) 500 MG tablet Take 1 tablet (500 mg total) by mouth every 6 (six) hours as needed for muscle spasms. 50 tablet 0  . Multiple Vitamins-Minerals (WOMENS DAILY FORMULA) TABS Take 1 tablet by mouth daily.      . predniSONE (DELTASONE) 5 MG tablet Take 5 mg by mouth daily. Dr.Anderson    . vitamin C (ASCORBIC ACID) 500 MG tablet Take 500 mg by mouth daily.      . vitamin E 400 UNIT capsule Take 400 Units by mouth daily.      . ferrous sulfate 325 (65 FE) MG tablet Take 1 tablet (325 mg total) by mouth 3 (three) times daily after meals. (Patient not taking: Reported on 12/21/2015)  3  . meloxicam (MOBIC) 15 MG tablet Take 15 mg by mouth daily.    . Certolizumab Pegol (CIMZIA  [place]) Inject into the skin. 2 injections per month for RA     No facility-administered medications prior to visit.     ROS See HPI  Objective:  BP 132/88 (Patient Position: Sitting, Cuff Size: Large)   Pulse 79   Temp 98.4 F (36.9 C)   Ht 5\' 3"  (1.6 m)   Wt 173 lb (78.5 kg)   SpO2 97%   BMI 30.65 kg/m   BP Readings from Last 3 Encounters:  12/21/15 132/88  05/28/15 (!) 154/96  01/16/15 (!) 124/100    Wt Readings from Last 3 Encounters:  12/21/15 173 lb (78.5 kg)  05/28/15 163 lb (73.9 kg)  01/16/15 160 lb (72.6 kg)    Physical Exam  Constitutional: She is oriented to person, place, and time.  HENT:  Right Ear: Tympanic membrane, external ear and ear canal normal.  Left Ear: Tympanic membrane, external ear and ear canal normal.  Nose: Mucosal edema and rhinorrhea present. Right sinus exhibits maxillary sinus tenderness and frontal sinus tenderness. Left sinus exhibits maxillary sinus tenderness and frontal sinus tenderness.  Mouth/Throat: Uvula is midline. No trismus in the jaw. Posterior oropharyngeal erythema present. No oropharyngeal exudate.  Eyes: No scleral icterus.  Neck: Normal range of motion. Neck supple.  Cardiovascular: Normal rate and normal heart sounds.  Pulmonary/Chest: Effort normal and breath sounds normal.  Musculoskeletal: She exhibits no edema.  Lymphadenopathy:    She has cervical adenopathy.  Neurological: She is alert and oriented to person, place, and time.  Vitals reviewed.   Lab Results  Component Value Date   WBC 6.0 05/28/2015   HGB 11.6 (L) 05/28/2015   HCT 34.2 (L) 05/28/2015   PLT 370.0 05/28/2015   GLUCOSE 94 05/28/2015   CHOL 172 03/30/2010   TRIG 46.0 03/30/2010   HDL 49.00 03/30/2010   LDLCALC 114 (H) 03/30/2010   ALT 25 05/28/2015   AST 34 05/28/2015   NA 140 05/28/2015   K 5.0 05/28/2015   CL 103 05/28/2015   CREATININE 0.89 05/28/2015   BUN 14 05/28/2015   CO2 34 (H) 05/28/2015   TSH 1.72 05/28/2015   INR 1.06  07/01/2014   HGBA1C 6.0 01/12/2009   MICROALBUR 0.2 08/22/2007    US Thyroid Biopsy  Result Date: 08/18/2015 INDICATION: Multinodular thyroid goiter. Request fine-needle aspiration of right inferior thyroid nodule. EXAM: ULTRASOUND GUIDED NEEDLE ASPIRATE BIOPSY OF THE THYROID GLAND COMPARISON:  Thyroid ultrasound performed 06/07/2015 MEDICATIONS: 1% lidocaine COMPLICATIONS: None immediate. PROCEDURE: Informed written consent was obtained from the patient after a thorough discussion of the procedural risks, benefits and alternatives. All questions were addressed. Maximal Sterile Barrier Technique was utilized including caps, mask, sterile gowns, sterile gloves, sterile drape, hand hygiene and skin antiseptic. A timeout was performed prior to the initiation of the procedure. Ultrasound was performed to localize and mark an adequate site for the biopsy. In the right inferior thyroid, there is a solid nodule measuring approximately 1.2 x 0.8 x 1.3 cm. This nodule has calcifications and is selected for fine-needle aspiration. The patient was then prepped and draped in a normal sterile fashion. Local anesthesia was provided with 1% lidocaine. Using direct ultrasound guidance, 5 passes were made using 25 gauge needles into the nodule within the right inferior lobe of the thyroid. Ultrasound was used to confirm needle placements on all occasions. Specimens were sent to Pathology for analysis. IMPRESSION: Ultrasound guided needle aspirate biopsy performed of the right inferior thyroid nodule. Read by: Ascencion Dike PA-C Electronically Signed   By: Marybelle Killings M.D.   On: 08/18/2015 11:34    Assessment & Plan:   Haley Roberts was seen today for nasal congestion.  Diagnoses and all orders for this visit:  Acute non-recurrent pansinusitis -     amoxicillin-clavulanate (AUGMENTIN) 875-125 MG tablet; Take 1 tablet by mouth 2 (two) times daily. -     benzonatate (TESSALON) 100 MG capsule; Take 1 capsule (100 mg total) by  mouth 3 (three) times daily as needed for cough. -     guaiFENesin (MUCINEX) 600 MG 12 hr tablet; Take 1 tablet (600 mg total) by mouth 2 (two) times daily as needed for cough or to loosen phlegm.  Immunocompromised patient [facility])   I have discontinued Ms. Dipinto's Certolizumab Pegol (CIMZIA [place]). I am also having her start on amoxicillin-clavulanate, benzonatate, and guaiFENesin. Additionally, I am having her maintain her clorazepate, escitalopram, WOMENS DAILY FORMULA, vitamin E, vitamin C, Calcium Carbonate-Vit D-Min (CALTRATE PLUS PO), predniSONE, buPROPion, ferrous sulfate, methocarbamol, HYDROcodone-acetaminophen, meloxicam, losartan, and Tocilizumab (ACTEMRA IV).  Meds ordered this encounter  Medications  . Tocilizumab (ACTEMRA IV)    Sig: Inject into the vein once. Once a month  . amoxicillin-clavulanate (AUGMENTIN) 875-125 MG tablet    Sig: Take 1 tablet by mouth 2 (two) times daily.    Dispense:  20 tablet    Refill:  0    Order Specific Question:   Supervising Provider    Answer:   Cassandria Anger [[id number]]  . benzonatate (TESSALON) 100 MG capsule    Sig: Take 1 capsule (100 mg total) by mouth 3 (three) times daily as needed for cough.    Dispense:  30 capsule    Refill:  0    Order Specific Question:   Supervising Provider    Answer:   Cassandria Anger [[id number]]  . guaiFENesin (MUCINEX) 600 MG 12 hr tablet    Sig: Take 1 tablet (600 mg total) by mouth 2 (two) times daily as needed for cough or to loosen phlegm.    Dispense:  14 tablet    Refill:  0    Order Specific Question:   Supervising Provider    Answer:   Cassandria Anger [[id number]]    Follow-up: No Follow-up on file.  Wilfred Lacy, NP

## 2015-12-23 ENCOUNTER — Telehealth: Payer: Self-pay | Admitting: Nurse Practitioner

## 2015-12-23 ENCOUNTER — Telehealth: Payer: Self-pay

## 2015-12-23 ENCOUNTER — Other Ambulatory Visit: Payer: Self-pay | Admitting: Internal Medicine

## 2015-12-23 MED ORDER — AZITHROMYCIN 250 MG PO TABS: ORAL_TABLET | ORAL | 0 refills | Status: DC

## 2015-12-23 NOTE — Telephone Encounter (Signed)
Patient was given augmentin and this is giving her an upset stomach.  Is requesting something in place of this to be sent to [facility] at [facility].

## 2015-12-23 NOTE — Telephone Encounter (Signed)
error 

## 2015-12-23 NOTE — Telephone Encounter (Signed)
Advised patients wife that zpak has been called in to pharm and also to start probiotic OTC with this abx, per [place]

## 2016-01-21 DIAGNOSIS — Z6829 Body mass index (BMI) 29.0-29.9, adult: Secondary | ICD-10-CM | POA: Diagnosis not present

## 2016-01-21 DIAGNOSIS — M797 Fibromyalgia: Secondary | ICD-10-CM | POA: Diagnosis not present

## 2016-01-21 DIAGNOSIS — M150 Primary generalized (osteo)arthritis: Secondary | ICD-10-CM | POA: Diagnosis not present

## 2016-01-21 DIAGNOSIS — E663 Overweight: Secondary | ICD-10-CM | POA: Diagnosis not present

## 2016-01-21 DIAGNOSIS — M0589 Other rheumatoid arthritis with rheumatoid factor of multiple sites: Secondary | ICD-10-CM | POA: Diagnosis not present

## 2016-01-27 DIAGNOSIS — M0589 Other rheumatoid arthritis with rheumatoid factor of multiple sites: Secondary | ICD-10-CM | POA: Diagnosis not present

## 2016-03-02 DIAGNOSIS — M0589 Other rheumatoid arthritis with rheumatoid factor of multiple sites: Secondary | ICD-10-CM | POA: Diagnosis not present

## 2016-03-02 DIAGNOSIS — Z79899 Other long term (current) drug therapy: Secondary | ICD-10-CM | POA: Diagnosis not present

## 2016-03-02 LAB — BASIC METABOLIC PANEL
BUN: 21 mg/dL (ref 4–21)
Creatinine: 0.8 mg/dL (ref 0.5–1.1)
Glucose: 86 mg/dL
Potassium: 5 mmol/L (ref 3.4–5.3)
Sodium: 140 mmol/L (ref 137–147)

## 2016-03-02 LAB — HEPATIC FUNCTION PANEL
ALK PHOS: 94 U/L (ref 25–125)
ALT: 19 U/L (ref 7–35)
AST: 30 U/L (ref 13–35)
Bilirubin, Total: 0.3 mg/dL

## 2016-03-02 LAB — LIPID PANEL
CHOLESTEROL: 168 mg/dL (ref 0–200)
HDL: 66 mg/dL (ref 35–70)
LDL Cholesterol: 80 mg/dL
Triglycerides: 108 mg/dL (ref 40–160)

## 2016-03-02 LAB — CBC AND DIFFERENTIAL
HEMATOCRIT: 33 % — AB (ref 36–46)
HEMOGLOBIN: 10.5 g/dL — AB (ref 12.0–16.0)
PLATELETS: 308 10*3/uL (ref 150–399)
WBC: 5.8 10*3/mL

## 2016-03-08 ENCOUNTER — Encounter: Payer: Self-pay | Admitting: Internal Medicine

## 2016-04-03 DIAGNOSIS — M0589 Other rheumatoid arthritis with rheumatoid factor of multiple sites: Secondary | ICD-10-CM | POA: Diagnosis not present

## 2016-05-02 DIAGNOSIS — M150 Primary generalized (osteo)arthritis: Secondary | ICD-10-CM | POA: Diagnosis not present

## 2016-05-02 DIAGNOSIS — E669 Obesity, unspecified: Secondary | ICD-10-CM | POA: Diagnosis not present

## 2016-05-02 DIAGNOSIS — M797 Fibromyalgia: Secondary | ICD-10-CM | POA: Diagnosis not present

## 2016-05-02 DIAGNOSIS — M25421 Effusion, right elbow: Secondary | ICD-10-CM | POA: Diagnosis not present

## 2016-05-02 DIAGNOSIS — Z6830 Body mass index (BMI) 30.0-30.9, adult: Secondary | ICD-10-CM | POA: Diagnosis not present

## 2016-05-02 DIAGNOSIS — M0589 Other rheumatoid arthritis with rheumatoid factor of multiple sites: Secondary | ICD-10-CM | POA: Diagnosis not present

## 2016-05-18 DIAGNOSIS — M0589 Other rheumatoid arthritis with rheumatoid factor of multiple sites: Secondary | ICD-10-CM | POA: Diagnosis not present

## 2016-05-18 DIAGNOSIS — M25421 Effusion, right elbow: Secondary | ICD-10-CM | POA: Diagnosis not present

## 2016-05-18 DIAGNOSIS — Z6829 Body mass index (BMI) 29.0-29.9, adult: Secondary | ICD-10-CM | POA: Diagnosis not present

## 2016-05-18 DIAGNOSIS — E663 Overweight: Secondary | ICD-10-CM | POA: Diagnosis not present

## 2016-05-18 DIAGNOSIS — M797 Fibromyalgia: Secondary | ICD-10-CM | POA: Diagnosis not present

## 2016-05-18 DIAGNOSIS — M150 Primary generalized (osteo)arthritis: Secondary | ICD-10-CM | POA: Diagnosis not present

## 2016-05-19 DIAGNOSIS — I10 Essential (primary) hypertension: Secondary | ICD-10-CM | POA: Diagnosis not present

## 2016-05-19 DIAGNOSIS — F329 Major depressive disorder, single episode, unspecified: Secondary | ICD-10-CM | POA: Diagnosis not present

## 2016-05-19 DIAGNOSIS — S0180XA Unspecified open wound of other part of head, initial encounter: Secondary | ICD-10-CM | POA: Diagnosis not present

## 2016-05-19 DIAGNOSIS — M069 Rheumatoid arthritis, unspecified: Secondary | ICD-10-CM | POA: Diagnosis not present

## 2016-05-31 DIAGNOSIS — E663 Overweight: Secondary | ICD-10-CM | POA: Diagnosis not present

## 2016-05-31 DIAGNOSIS — Z6829 Body mass index (BMI) 29.0-29.9, adult: Secondary | ICD-10-CM | POA: Diagnosis not present

## 2016-05-31 DIAGNOSIS — M150 Primary generalized (osteo)arthritis: Secondary | ICD-10-CM | POA: Diagnosis not present

## 2016-05-31 DIAGNOSIS — M797 Fibromyalgia: Secondary | ICD-10-CM | POA: Diagnosis not present

## 2016-05-31 DIAGNOSIS — M0589 Other rheumatoid arthritis with rheumatoid factor of multiple sites: Secondary | ICD-10-CM | POA: Diagnosis not present

## 2016-05-31 DIAGNOSIS — M25421 Effusion, right elbow: Secondary | ICD-10-CM | POA: Diagnosis not present

## 2016-06-02 DIAGNOSIS — M0589 Other rheumatoid arthritis with rheumatoid factor of multiple sites: Secondary | ICD-10-CM | POA: Diagnosis not present

## 2016-06-15 ENCOUNTER — Other Ambulatory Visit: Payer: Self-pay | Admitting: Internal Medicine

## 2016-06-30 DIAGNOSIS — M0589 Other rheumatoid arthritis with rheumatoid factor of multiple sites: Secondary | ICD-10-CM | POA: Diagnosis not present

## 2016-06-30 DIAGNOSIS — Z79899 Other long term (current) drug therapy: Secondary | ICD-10-CM | POA: Diagnosis not present

## 2016-07-11 ENCOUNTER — Other Ambulatory Visit: Payer: Self-pay | Admitting: Internal Medicine

## 2016-07-13 ENCOUNTER — Other Ambulatory Visit: Payer: Self-pay | Admitting: Internal Medicine

## 2016-07-18 ENCOUNTER — Telehealth: Payer: Self-pay | Admitting: *Deleted

## 2016-07-18 MED ORDER — LOSARTAN POTASSIUM 100 MG PO TABS: 100.0000 mg | ORAL_TABLET | Freq: Every day | ORAL | 0 refills | Status: DC

## 2016-07-18 NOTE — Telephone Encounter (Signed)
Rec'd call pt states she has made appt for 7/17, but she is out of her BP med needing a refill until appt. Inform pt can send enough med in until appt...Johny Chess

## 2016-07-25 DIAGNOSIS — M25421 Effusion, right elbow: Secondary | ICD-10-CM | POA: Diagnosis not present

## 2016-07-25 DIAGNOSIS — E663 Overweight: Secondary | ICD-10-CM | POA: Diagnosis not present

## 2016-07-25 DIAGNOSIS — M150 Primary generalized (osteo)arthritis: Secondary | ICD-10-CM | POA: Diagnosis not present

## 2016-07-25 DIAGNOSIS — M797 Fibromyalgia: Secondary | ICD-10-CM | POA: Diagnosis not present

## 2016-07-25 DIAGNOSIS — Z6827 Body mass index (BMI) 27.0-27.9, adult: Secondary | ICD-10-CM | POA: Diagnosis not present

## 2016-07-25 DIAGNOSIS — M0589 Other rheumatoid arthritis with rheumatoid factor of multiple sites: Secondary | ICD-10-CM | POA: Diagnosis not present

## 2016-07-31 DIAGNOSIS — M0589 Other rheumatoid arthritis with rheumatoid factor of multiple sites: Secondary | ICD-10-CM | POA: Diagnosis not present

## 2016-07-31 NOTE — Progress Notes (Signed)
Subjective:    Patient ID: Haley Roberts, female    DOB: [birth year], 71 y.o.   MRN: [id number]  HPI The patient is here for follow up.  Hypertension: She is taking her medication daily. She is compliant with a low sodium diet.  She denies chest pain, palpitations, edema, shortness of breath ( except for stairs) and regular headaches. She is not exercising regularly.  She does monitor her blood pressure at home - 120 - 130's/?.    Depression:  She follows with psychiatry.  She feels her depression is controlled.   Fibromyalgia, RA, OA:  She is followig with rheumatology.  She has chronic pain from her RA, OA and fibromyalgia.  Her medication was recently adjusted.   Fluid retention:  She feels intermittent fluid retention and thinks it may be from her medication.  She wondered about taking a water pill as needed.   Medications and allergies reviewed with patient and updated if appropriate.  Patient Active Problem List   Diagnosis Date Noted  . Fibromyalgia 05/28/2015  . Overweight (BMI 25.0-29.9) 07/08/2014  . S/P left TKA 07/07/2014  . S/P knee replacement 07/07/2014  . Right knee DJD 08/29/2010  . Rheumatoid arthritis ([place]) 03/30/2010  . PAIN IN JOINT, MULTIPLE SITES 01/14/2009  . Osteoarthritis 01/12/2009  . Non-toxic nodular goiter 08/22/2007  . Essential hypertension 08/22/2007  . HYPERGLYCEMIA, FASTING 08/22/2007  . ANEMIA-NOS 05/31/2006  . Depression 05/31/2006  . ABNORMAL THYROID FUNCTION TESTS 05/31/2006    Current Outpatient Prescriptions on File Prior to Visit  Medication Sig Dispense Refill  . buPROPion (WELLBUTRIN XL) 300 MG 24 hr tablet Take 150 mg by mouth daily.     . Calcium Carbonate-Vit D-Min (CALTRATE PLUS PO) Take 1 tablet by mouth 2 (two) times daily.      . clorazepate (TRANXENE) 3.75 MG tablet Take 7.5-11.25 mg by mouth 2 (two) times daily as needed for anxiety.     Marland Kitchen escitalopram (LEXAPRO) 10 MG tablet Take 10 mg by mouth daily. Dr.Kaur    .  HYDROcodone-acetaminophen (NORCO/VICODIN) 5-325 MG tablet TK 1-2 TS PO PRN Q 6 H  0  . losartan (COZAAR) 100 MG tablet Take 1 tablet (100 mg total) by mouth daily. Must keep 08/01/16 appt for future refills 14 tablet 0  . meloxicam (MOBIC) 15 MG tablet Take 15 mg by mouth daily.    . methocarbamol (ROBAXIN) 500 MG tablet Take 1 tablet (500 mg total) by mouth every 6 (six) hours as needed for muscle spasms. 50 tablet 0  . Multiple Vitamins-Minerals (WOMENS DAILY FORMULA) TABS Take 1 tablet by mouth daily.      . predniSONE (DELTASONE) 5 MG tablet Take 4 mg by mouth daily. Dr.Anderson     . Tocilizumab (ACTEMRA IV) Inject into the vein once. Once a month    . vitamin C (ASCORBIC ACID) 500 MG tablet Take 500 mg by mouth daily.      . vitamin E 400 UNIT capsule Take 400 Units by mouth daily.      . ferrous sulfate 325 (65 FE) MG tablet Take 1 tablet (325 mg total) by mouth 3 (three) times daily after meals. (Patient not taking: Reported on 12/21/2015)  3   No current facility-administered medications on file prior to visit.     Past Medical History:  Diagnosis Date  . Anemia   . Anxiety   . Depression   . Fibromyalgia   . Hyperglycemia   . Hypertension   . Multiple  thyroid nodules    gets ultra sound yearly on thryoid  . Rheumatoid arthritis(714.0)     Past Surgical History:  Procedure Laterality Date  . ABDOMINAL HYSTERECTOMY    . BREAST BIOPSY    . JOINT REPLACEMENT     right knee replacement 2012  . TOTAL KNEE ARTHROPLASTY Left 07/07/2014   Procedure: LEFT TOTAL KNEE ARTHROPLASTY;  Surgeon: Paralee Cancel, MD;  Location: [facility];  Service: Orthopedics;  Laterality: Left;    Social History   Social History  . Marital status: Married    Spouse name: N/A  . Number of children: N/A  . Years of education: N/A   Social History Main Topics  . Smoking status: Never Smoker  . Smokeless tobacco: Never Used  . Alcohol use No  . Drug use: No  . Sexual activity: Not on file   Other  Topics Concern  . Not on file   Social History Narrative   Reg exercise          Family History  Problem Relation Age of Onset  . Cancer Mother        breast  . Arthritis Father   . Hypertension Father   . Diabetes Sister   . Hypertension Brother   . Diabetes Son   . Depression Son     Review of Systems  Constitutional: Negative for chills and fever.  Respiratory: Positive for shortness of breath (mild with stairs). Negative for cough and wheezing.   Cardiovascular: Negative for chest pain, palpitations and leg swelling.  Musculoskeletal: Positive for arthralgias and joint swelling.  Neurological: Negative for light-headedness and headaches.       Objective:   Vitals:   08/01/16 1512  BP: (!) 144/94  Pulse: 82  Resp: 16  Temp: 98.2 F (36.8 C)   Wt Readings from Last 3 Encounters:  08/01/16 159 lb (72.1 kg)  12/21/15 173 lb (78.5 kg)  05/28/15 163 lb (73.9 kg)   Body mass index is 28.17 kg/m.   Physical Exam    Constitutional: Appears well-developed and well-nourished. No distress.  HENT:  Head: Normocephalic and atraumatic.  Neck: Neck supple. No tracheal deviation present. No thyromegaly present.  No cervical lymphadenopathy Cardiovascular: Normal rate, regular rhythm and normal heart sounds.   No murmur heard. No carotid bruit .  No edema Pulmonary/Chest: Effort normal and breath sounds normal. No respiratory distress. No has no wheezes. No rales.  Skin: Skin is warm and dry. Not diaphoretic.  Psychiatric: Normal mood and affect. Behavior is normal.      Assessment & Plan:   Pneumovax today  See Problem List for Assessment and Plan of chronic medical problems.

## 2016-08-01 ENCOUNTER — Encounter: Payer: Self-pay | Admitting: Internal Medicine

## 2016-08-01 ENCOUNTER — Ambulatory Visit (INDEPENDENT_AMBULATORY_CARE_PROVIDER_SITE_OTHER): Payer: Medicare Other | Admitting: Internal Medicine

## 2016-08-01 VITALS — BP 144/94 | HR 82 | Temp 98.2°F | Resp 16 | Wt 159.0 lb

## 2016-08-01 DIAGNOSIS — R946 Abnormal results of thyroid function studies: Secondary | ICD-10-CM | POA: Diagnosis not present

## 2016-08-01 DIAGNOSIS — Z23 Encounter for immunization: Secondary | ICD-10-CM

## 2016-08-01 DIAGNOSIS — M069 Rheumatoid arthritis, unspecified: Secondary | ICD-10-CM

## 2016-08-01 DIAGNOSIS — I10 Essential (primary) hypertension: Secondary | ICD-10-CM | POA: Diagnosis not present

## 2016-08-01 DIAGNOSIS — M797 Fibromyalgia: Secondary | ICD-10-CM

## 2016-08-01 DIAGNOSIS — F3289 Other specified depressive episodes: Secondary | ICD-10-CM | POA: Diagnosis not present

## 2016-08-01 DIAGNOSIS — Z1159 Encounter for screening for other viral diseases: Secondary | ICD-10-CM | POA: Diagnosis not present

## 2016-08-01 DIAGNOSIS — R7309 Other abnormal glucose: Secondary | ICD-10-CM

## 2016-08-01 MED ORDER — LOSARTAN POTASSIUM 100 MG PO TABS: 100.0000 mg | ORAL_TABLET | Freq: Every day | ORAL | 3 refills | Status: DC

## 2016-08-01 MED ORDER — HYDROCHLOROTHIAZIDE 12.5 MG PO TABS: 12.5000 mg | ORAL_TABLET | Freq: Every day | ORAL | 3 refills | Status: DC | PRN

## 2016-08-01 NOTE — Patient Instructions (Addendum)
  No immunizations administered today.   Medications reviewed and updated.  Changes include tring hydrochlorothiazide 12. 5mg  daily as needed for fluid retention.   Your prescription(s) have been submitted to your pharmacy. Please take as directed and contact our office if you believe you are having problem(s) with the medication(s).   Please followup in one year

## 2016-08-01 NOTE — Assessment & Plan Note (Signed)
Following with rheumatology

## 2016-08-01 NOTE — Assessment & Plan Note (Signed)
a1c

## 2016-08-01 NOTE — Assessment & Plan Note (Signed)
Controlled Management per psych

## 2016-08-01 NOTE — Assessment & Plan Note (Addendum)
Well controlled at home - has white coat htn Continue current medications at current doses Has some fluid retention - will start hctz 12.5 mg prn Monitor BP at home Cmp, tsh

## 2016-08-01 NOTE — Assessment & Plan Note (Signed)
Following with Dr Amil Amen On Actemra x 6 months Taking robaxin, mobic and vicodin management per rheumatology

## 2016-08-01 NOTE — Assessment & Plan Note (Signed)
Check tsh H/o abn thyroid function

## 2016-08-17 DIAGNOSIS — M150 Primary generalized (osteo)arthritis: Secondary | ICD-10-CM | POA: Diagnosis not present

## 2016-08-17 DIAGNOSIS — M0589 Other rheumatoid arthritis with rheumatoid factor of multiple sites: Secondary | ICD-10-CM | POA: Diagnosis not present

## 2016-08-17 DIAGNOSIS — M25421 Effusion, right elbow: Secondary | ICD-10-CM | POA: Diagnosis not present

## 2016-08-17 DIAGNOSIS — M797 Fibromyalgia: Secondary | ICD-10-CM | POA: Diagnosis not present

## 2016-08-18 ENCOUNTER — Ambulatory Visit: Payer: Self-pay | Admitting: Internal Medicine

## 2016-09-25 DIAGNOSIS — M0589 Other rheumatoid arthritis with rheumatoid factor of multiple sites: Secondary | ICD-10-CM | POA: Diagnosis not present

## 2016-10-06 DIAGNOSIS — M7021 Olecranon bursitis, right elbow: Secondary | ICD-10-CM | POA: Diagnosis not present

## 2016-10-23 DIAGNOSIS — M0589 Other rheumatoid arthritis with rheumatoid factor of multiple sites: Secondary | ICD-10-CM | POA: Diagnosis not present

## 2016-10-24 DIAGNOSIS — E663 Overweight: Secondary | ICD-10-CM | POA: Diagnosis not present

## 2016-10-24 DIAGNOSIS — M0589 Other rheumatoid arthritis with rheumatoid factor of multiple sites: Secondary | ICD-10-CM | POA: Diagnosis not present

## 2016-10-24 DIAGNOSIS — M797 Fibromyalgia: Secondary | ICD-10-CM | POA: Diagnosis not present

## 2016-10-24 DIAGNOSIS — M150 Primary generalized (osteo)arthritis: Secondary | ICD-10-CM | POA: Diagnosis not present

## 2016-10-24 DIAGNOSIS — M25421 Effusion, right elbow: Secondary | ICD-10-CM | POA: Diagnosis not present

## 2016-10-24 DIAGNOSIS — Z6826 Body mass index (BMI) 26.0-26.9, adult: Secondary | ICD-10-CM | POA: Diagnosis not present

## 2017-01-24 DIAGNOSIS — M150 Primary generalized (osteo)arthritis: Secondary | ICD-10-CM | POA: Diagnosis not present

## 2017-01-24 DIAGNOSIS — Z6825 Body mass index (BMI) 25.0-25.9, adult: Secondary | ICD-10-CM | POA: Diagnosis not present

## 2017-01-24 DIAGNOSIS — M0589 Other rheumatoid arthritis with rheumatoid factor of multiple sites: Secondary | ICD-10-CM | POA: Diagnosis not present

## 2017-01-24 DIAGNOSIS — M797 Fibromyalgia: Secondary | ICD-10-CM | POA: Diagnosis not present

## 2017-01-24 DIAGNOSIS — E663 Overweight: Secondary | ICD-10-CM | POA: Diagnosis not present

## 2017-01-24 DIAGNOSIS — M25421 Effusion, right elbow: Secondary | ICD-10-CM | POA: Diagnosis not present

## 2017-01-25 DIAGNOSIS — M0589 Other rheumatoid arthritis with rheumatoid factor of multiple sites: Secondary | ICD-10-CM | POA: Diagnosis not present

## 2017-02-22 NOTE — Progress Notes (Signed)
Subjective:    Patient ID: Haley Roberts, female    DOB: [birth year], 72 y.o.   MRN: [id number]  HPI She is here for an acute visit for cold symptoms.  Her symptoms started about 10 days ago.    She is experiencing nasal congestion, ear pain, rhinorrhea, sinus pressure, sore throat, hoarseness, cough, and headaches.  She denies fever/chills, SOB and wheeze. There is no nausea or diarrhea.   She has taken mucinex.    Her son had similar symptoms.    Medications and allergies reviewed with patient and updated if appropriate.  Patient Active Problem List   Diagnosis Date Noted  . Fibromyalgia 05/28/2015  . Overweight (BMI 25.0-29.9) 07/08/2014  . S/P left TKA 07/07/2014  . S/P knee replacement 07/07/2014  . Right knee DJD 08/29/2010  . Rheumatoid arthritis ([place]) 03/30/2010  . PAIN IN JOINT, MULTIPLE SITES 01/14/2009  . Osteoarthritis 01/12/2009  . Non-toxic nodular goiter 08/22/2007  . Essential hypertension 08/22/2007  . HYPERGLYCEMIA, FASTING 08/22/2007  . ANEMIA-NOS 05/31/2006  . Depression 05/31/2006  . ABNORMAL THYROID FUNCTION TESTS 05/31/2006    Current Outpatient Medications on File Prior to Visit  Medication Sig Dispense Refill  . buPROPion (WELLBUTRIN XL) 300 MG 24 hr tablet Take 150 mg by mouth daily.     . Calcium Carbonate-Vit D-Min (CALTRATE PLUS PO) Take 1 tablet by mouth 2 (two) times daily.      . clorazepate (TRANXENE) 3.75 MG tablet Take 7.5-11.25 mg by mouth 2 (two) times daily as needed for anxiety.     Marland Kitchen escitalopram (LEXAPRO) 10 MG tablet Take 10 mg by mouth daily. Dr.Kaur    . ferrous sulfate 325 (65 FE) MG tablet Take 1 tablet (325 mg total) by mouth 3 (three) times daily after meals.  3  . hydrochlorothiazide (HYDRODIURIL) 12.5 MG tablet Take 1 tablet (12.5 mg total) by mouth daily as needed. 90 tablet 3  . HYDROcodone-acetaminophen (NORCO/VICODIN) 5-325 MG tablet TK 1-2 TS PO PRN Q 6 H  0  . losartan (COZAAR) 100 MG tablet Take 1 tablet  (100 mg total) by mouth daily. 90 tablet 3  . meloxicam (MOBIC) 15 MG tablet Take 15 mg by mouth daily.    . methocarbamol (ROBAXIN) 500 MG tablet Take 1 tablet (500 mg total) by mouth every 6 (six) hours as needed for muscle spasms. 50 tablet 0  . Multiple Vitamins-Minerals (WOMENS DAILY FORMULA) TABS Take 1 tablet by mouth daily.      . predniSONE (DELTASONE) 5 MG tablet Take 4 mg by mouth daily. Dr.Anderson     . Tocilizumab (ACTEMRA IV) Inject into the vein once. Once a month    . vitamin C (ASCORBIC ACID) 500 MG tablet Take 500 mg by mouth daily.      . vitamin E 400 UNIT capsule Take 400 Units by mouth daily.       No current facility-administered medications on file prior to visit.     Past Medical History:  Diagnosis Date  . Anemia   . Anxiety   . Depression   . Fibromyalgia   . Hyperglycemia   . Hypertension   . Multiple thyroid nodules    gets ultra sound yearly on thryoid  . Rheumatoid arthritis(714.0)     Past Surgical History:  Procedure Laterality Date  . ABDOMINAL HYSTERECTOMY    . BREAST BIOPSY    . JOINT REPLACEMENT     right knee replacement 2012  . TOTAL KNEE ARTHROPLASTY Left 07/07/2014  Procedure: LEFT TOTAL KNEE ARTHROPLASTY;  Surgeon: Paralee Cancel, MD;  Location: [facility];  Service: Orthopedics;  Laterality: Left;    Social History   Socioeconomic History  . Marital status: Married    Spouse name: None  . Number of children: None  . Years of education: None  . Highest education level: None  Social Needs  . Financial resource strain: None  . Food insecurity - worry: None  . Food insecurity - inability: None  . Transportation needs - medical: None  . Transportation needs - non-medical: None  Occupational History  . None  Tobacco Use  . Smoking status: Never Smoker  . Smokeless tobacco: Never Used  Substance and Sexual Activity  . Alcohol use: No  . Drug use: No  . Sexual activity: None  Other Topics Concern  . None  Social History  Narrative   Reg exercise          Family History  Problem Relation Age of Onset  . Cancer Mother        breast  . Arthritis Father   . Hypertension Father   . Diabetes Sister   . Hypertension Brother   . Diabetes Son   . Depression Son     Review of Systems  Constitutional: Negative for chills and fever.  HENT: Positive for congestion, ear pain (mild, occ), rhinorrhea, sinus pressure, sore throat and voice change. Negative for sinus pain.   Respiratory: Positive for cough (minimally productive). Negative for shortness of breath and wheezing.   Gastrointestinal: Negative for diarrhea and nausea.  Neurological: Positive for headaches. Negative for light-headedness.       Objective:   Vitals:   02/23/17 1439  BP: (!) 182/94  Pulse: 69  Resp: 16  Temp: 98.4 F (36.9 C)  SpO2: 97%   Filed Weights   02/23/17 1439  Weight: 150 lb (68 kg)   Body mass index is 26.57 kg/m.  Wt Readings from Last 3 Encounters:  02/23/17 150 lb (68 kg)  08/01/16 159 lb (72.1 kg)  12/21/15 173 lb (78.5 kg)     Physical Exam GENERAL APPEARANCE: Appears stated age, well appearing, NAD EYES: conjunctiva clear, no icterus HEENT: bilateral tympanic membranes and ear canals normal, oropharynx with mild erythema, no thyromegaly, trachea midline, no cervical or supraclavicular lymphadenopathy LUNGS: Clear to auscultation without wheeze or crackles, unlabored breathing, good air entry bilaterally CARDIOVASCULAR: Normal S1,S2 without murmurs, no edema SKIN: warm, dry        Assessment & Plan:   See Problem List for Assessment and Plan of chronic medical problems.

## 2017-02-23 ENCOUNTER — Ambulatory Visit (INDEPENDENT_AMBULATORY_CARE_PROVIDER_SITE_OTHER): Payer: Medicare Other | Admitting: Internal Medicine

## 2017-02-23 ENCOUNTER — Encounter: Payer: Self-pay | Admitting: Internal Medicine

## 2017-02-23 VITALS — BP 182/94 | HR 69 | Temp 98.4°F | Resp 16 | Wt 150.0 lb

## 2017-02-23 DIAGNOSIS — J069 Acute upper respiratory infection, unspecified: Secondary | ICD-10-CM | POA: Insufficient documentation

## 2017-02-23 MED ORDER — AZITHROMYCIN 250 MG PO TABS: ORAL_TABLET | ORAL | 0 refills | Status: DC

## 2017-02-23 NOTE — Patient Instructions (Addendum)
Take the zpak as prescribed.  Continue the mucinex.    Call if no improvement      Upper Respiratory Infection, Adult Most upper respiratory infections (URIs) are a viral infection of the air passages leading to the lungs. A URI affects the nose, throat, and upper air passages. The most common type of URI is nasopharyngitis and is typically referred to as "the common cold." URIs run their course and usually go away on their own. Most of the time, a URI does not require medical attention, but sometimes a bacterial infection in the upper airways can follow a viral infection. This is called a secondary infection. Sinus and middle ear infections are common types of secondary upper respiratory infections. Bacterial pneumonia can also complicate a URI. A URI can worsen asthma and chronic obstructive pulmonary disease (COPD). Sometimes, these complications can require emergency medical care and may be life threatening. What are the causes? Almost all URIs are caused by viruses. A virus is a type of germ and can spread from one person to another. What increases the risk? You may be at risk for a URI if:  You smoke.  You have chronic heart or lung disease.  You have a weakened defense (immune) system.  You are very young or very old.  You have nasal allergies or asthma.  You work in crowded or poorly ventilated areas.  You work in health care facilities or schools.  What are the signs or symptoms? Symptoms typically develop 2-3 days after you come in contact with a cold virus. Most viral URIs last 7-10 days. However, viral URIs from the influenza virus (flu virus) can last 14-18 days and are typically more severe. Symptoms may include:  Runny or stuffy (congested) nose.  Sneezing.  Cough.  Sore throat.  Headache.  Fatigue.  Fever.  Loss of appetite.  Pain in your forehead, behind your eyes, and over your cheekbones (sinus pain).  Muscle aches.  How is this diagnosed? Your  health care provider may diagnose a URI by:  Physical exam.  Tests to check that your symptoms are not due to another condition such as: ? Strep throat. ? Sinusitis. ? Pneumonia. ? Asthma.  How is this treated? A URI goes away on its own with time. It cannot be cured with medicines, but medicines may be prescribed or recommended to relieve symptoms. Medicines may help:  Reduce your fever.  Reduce your cough.  Relieve nasal congestion.  Follow these instructions at home:  Take medicines only as directed by your health care provider.  Gargle warm saltwater or take cough drops to comfort your throat as directed by your health care provider.  Use a warm mist humidifier or inhale steam from a shower to increase air moisture. This may make it easier to breathe.  Drink enough fluid to keep your urine clear or pale yellow.  Eat soups and other clear broths and maintain good nutrition.  Rest as needed.  Return to work when your temperature has returned to normal or as your health care provider advises. You may need to stay home longer to avoid infecting others. You can also use a face mask and careful hand washing to prevent spread of the virus.  Increase the usage of your inhaler if you have asthma.  Do not use any tobacco products, including cigarettes, chewing tobacco, or electronic cigarettes. If you need help quitting, ask your health care provider. How is this prevented? The best way to protect yourself from getting  a cold is to practice good hygiene.  Avoid oral or hand contact with people with cold symptoms.  Wash your hands often if contact occurs.  There is no clear evidence that vitamin C, vitamin E, echinacea, or exercise reduces the chance of developing a cold. However, it is always recommended to get plenty of rest, exercise, and practice good nutrition. Contact a health care provider if:  You are getting worse rather than better.  Your symptoms are not  controlled by medicine.  You have chills.  You have worsening shortness of breath.  You have brown or red mucus.  You have yellow or brown nasal discharge.  You have pain in your face, especially when you bend forward.  You have a fever.  You have swollen neck glands.  You have pain while swallowing.  You have white areas in the back of your throat. Get help right away if:  You have severe or persistent: ? Headache. ? Ear pain. ? Sinus pain. ? Chest pain.  You have chronic lung disease and any of the following: ? Wheezing. ? Prolonged cough. ? Coughing up blood. ? A change in your usual mucus.  You have a stiff neck.  You have changes in your: ? Vision. ? Hearing. ? Thinking. ? Mood. This information is not intended to replace advice given to you by your health care provider. Make sure you discuss any questions you have with your health care provider. Document Released: 06/28/2000 Document Revised: 09/05/2015 Document Reviewed: 04/09/2013 Elsevier Interactive Patient Education  [employer].

## 2017-02-23 NOTE — Assessment & Plan Note (Addendum)
She is immunocompromised and has had cold symptoms for 10 days - will start an antibiotic - zpak Continue mucinex, tylenol prn Rest, fluids  Call if no improvement

## 2017-03-19 DIAGNOSIS — M0589 Other rheumatoid arthritis with rheumatoid factor of multiple sites: Secondary | ICD-10-CM | POA: Diagnosis not present

## 2017-04-23 ENCOUNTER — Encounter: Payer: Self-pay | Admitting: Internal Medicine

## 2017-04-23 DIAGNOSIS — M0589 Other rheumatoid arthritis with rheumatoid factor of multiple sites: Secondary | ICD-10-CM | POA: Diagnosis not present

## 2017-04-23 DIAGNOSIS — Z79899 Other long term (current) drug therapy: Secondary | ICD-10-CM | POA: Diagnosis not present

## 2017-04-26 DIAGNOSIS — M797 Fibromyalgia: Secondary | ICD-10-CM | POA: Diagnosis not present

## 2017-04-26 DIAGNOSIS — M150 Primary generalized (osteo)arthritis: Secondary | ICD-10-CM | POA: Diagnosis not present

## 2017-04-26 DIAGNOSIS — Z6825 Body mass index (BMI) 25.0-25.9, adult: Secondary | ICD-10-CM | POA: Diagnosis not present

## 2017-04-26 DIAGNOSIS — E663 Overweight: Secondary | ICD-10-CM | POA: Diagnosis not present

## 2017-04-26 DIAGNOSIS — M5136 Other intervertebral disc degeneration, lumbar region: Secondary | ICD-10-CM | POA: Diagnosis not present

## 2017-04-26 DIAGNOSIS — M25421 Effusion, right elbow: Secondary | ICD-10-CM | POA: Diagnosis not present

## 2017-04-26 DIAGNOSIS — M0589 Other rheumatoid arthritis with rheumatoid factor of multiple sites: Secondary | ICD-10-CM | POA: Diagnosis not present

## 2017-05-21 DIAGNOSIS — M5416 Radiculopathy, lumbar region: Secondary | ICD-10-CM | POA: Diagnosis not present

## 2017-05-21 DIAGNOSIS — M4316 Spondylolisthesis, lumbar region: Secondary | ICD-10-CM | POA: Diagnosis not present

## 2017-05-21 DIAGNOSIS — M545 Low back pain: Secondary | ICD-10-CM | POA: Diagnosis not present

## 2017-05-28 DIAGNOSIS — M545 Low back pain: Secondary | ICD-10-CM | POA: Diagnosis not present

## 2017-06-04 DIAGNOSIS — M4310 Spondylolisthesis, site unspecified: Secondary | ICD-10-CM | POA: Diagnosis not present

## 2017-06-04 DIAGNOSIS — M4316 Spondylolisthesis, lumbar region: Secondary | ICD-10-CM | POA: Diagnosis not present

## 2017-06-05 ENCOUNTER — Telehealth: Payer: Self-pay | Admitting: Emergency Medicine

## 2017-06-05 NOTE — Telephone Encounter (Signed)
Called patient to schedule AWV. Patients phone was cutting in and out. Lost connect with patient. Will call back at later date.

## 2017-06-27 DIAGNOSIS — M0589 Other rheumatoid arthritis with rheumatoid factor of multiple sites: Secondary | ICD-10-CM | POA: Diagnosis not present

## 2017-07-05 DIAGNOSIS — M545 Low back pain: Secondary | ICD-10-CM | POA: Diagnosis not present

## 2017-07-05 DIAGNOSIS — M4316 Spondylolisthesis, lumbar region: Secondary | ICD-10-CM | POA: Diagnosis not present

## 2017-07-05 DIAGNOSIS — M5416 Radiculopathy, lumbar region: Secondary | ICD-10-CM | POA: Diagnosis not present

## 2017-08-01 ENCOUNTER — Other Ambulatory Visit: Payer: Self-pay | Admitting: Internal Medicine

## 2017-08-06 DIAGNOSIS — M0589 Other rheumatoid arthritis with rheumatoid factor of multiple sites: Secondary | ICD-10-CM | POA: Diagnosis not present

## 2017-08-06 DIAGNOSIS — Z79899 Other long term (current) drug therapy: Secondary | ICD-10-CM | POA: Diagnosis not present

## 2017-10-29 ENCOUNTER — Other Ambulatory Visit: Payer: Self-pay | Admitting: Internal Medicine

## 2017-11-06 ENCOUNTER — Other Ambulatory Visit: Payer: Self-pay | Admitting: Internal Medicine

## 2017-11-12 DIAGNOSIS — M150 Primary generalized (osteo)arthritis: Secondary | ICD-10-CM | POA: Diagnosis not present

## 2017-11-12 DIAGNOSIS — Z79899 Other long term (current) drug therapy: Secondary | ICD-10-CM | POA: Diagnosis not present

## 2017-11-12 DIAGNOSIS — M0589 Other rheumatoid arthritis with rheumatoid factor of multiple sites: Secondary | ICD-10-CM | POA: Diagnosis not present

## 2017-12-03 ENCOUNTER — Telehealth: Payer: Self-pay | Admitting: Internal Medicine

## 2017-12-05 NOTE — Telephone Encounter (Signed)
Pt called in and scheduled a medication refill appointment.  Pt states she only has three days of medication left and wants to know if this can be refilled now.

## 2017-12-06 MED ORDER — LOSARTAN POTASSIUM 100 MG PO TABS: 100.0000 mg | ORAL_TABLET | Freq: Every day | ORAL | 0 refills | Status: DC

## 2017-12-06 NOTE — Telephone Encounter (Signed)
Rx sent pt is aware 

## 2017-12-06 NOTE — Addendum Note (Signed)
Addended by: Delice Bison E on: 12/06/2017 01:28 PM   Modules accepted: Orders

## 2017-12-23 NOTE — Progress Notes (Deleted)
Subjective:    Patient ID: Haley Roberts, female    DOB: 18-Feb-1945, 72 y.o.   MRN: [id number]  HPI The patient is here for follow up.  Hypertension: She is taking her medication daily. She is compliant with a low sodium diet.  She denies chest pain, palpitations, edema, shortness of breath and regular headaches. She is exercising regularly.  She does not monitor her blood pressure at home.    Depression: she is following with psychiatry.  She is taking her medication daily as prescribed. She denies any side effects from the medication. She feels her depression is well controlled and she is happy with her current dose of medication.   Fibromyalgia, RA, OA:  She sees Dr Amil Amen.    Fluid retention:  She takes hctz as needed  hyerglycemia:  Medications and allergies reviewed with patient and updated if appropriate.  Patient Active Problem List   Diagnosis Date Noted  . Upper respiratory tract infection 02/23/2017  . Fibromyalgia 05/28/2015  . Overweight (BMI 25.0-29.9) 07/08/2014  . S/P left TKA 07/07/2014  . S/P knee replacement 07/07/2014  . Right knee DJD 08/29/2010  . Rheumatoid arthritis ([place]) 03/30/2010  . PAIN IN JOINT, MULTIPLE SITES 01/14/2009  . Osteoarthritis 01/12/2009  . Non-toxic nodular goiter 08/22/2007  . Essential hypertension 08/22/2007  . HYPERGLYCEMIA, FASTING 08/22/2007  . ANEMIA-NOS 05/31/2006  . Depression 05/31/2006  . ABNORMAL THYROID FUNCTION TESTS 05/31/2006    Current Outpatient Medications on File Prior to Visit  Medication Sig Dispense Refill  . azithromycin (ZITHROMAX) 250 MG tablet Take two tabs the first day and then one tab daily for four days 6 tablet 0  . buPROPion (WELLBUTRIN XL) 300 MG 24 hr tablet Take 150 mg by mouth daily.     . Calcium Carbonate-Vit D-Min (CALTRATE PLUS PO) Take 1 tablet by mouth 2 (two) times daily.      . clorazepate (TRANXENE) 3.75 MG tablet Take 7.5-11.25 mg by mouth 2 (two) times daily as needed for  anxiety.     Marland Kitchen escitalopram (LEXAPRO) 10 MG tablet Take 10 mg by mouth daily. Dr.Kaur    . ferrous sulfate 325 (65 FE) MG tablet Take 1 tablet (325 mg total) by mouth 3 (three) times daily after meals.  3  . hydrochlorothiazide (HYDRODIURIL) 12.5 MG tablet Take 1 tablet (12.5 mg total) by mouth daily. -- Office visit needed for further refills 90 tablet 0  . HYDROcodone-acetaminophen (NORCO/VICODIN) 5-325 MG tablet TK 1-2 TS PO PRN Q 6 H  0  . losartan (COZAAR) 100 MG tablet Take 1 tablet (100 mg total) by mouth daily. Need office visit for more refills. 30 tablet 0  . meloxicam (MOBIC) 15 MG tablet Take 15 mg by mouth daily.    . methocarbamol (ROBAXIN) 500 MG tablet Take 1 tablet (500 mg total) by mouth every 6 (six) hours as needed for muscle spasms. 50 tablet 0  . Multiple Vitamins-Minerals (WOMENS DAILY FORMULA) TABS Take 1 tablet by mouth daily.      . predniSONE (DELTASONE) 5 MG tablet Take 4 mg by mouth daily. Dr.Anderson     . Tocilizumab (ACTEMRA IV) Inject into the vein once. Once a month    . vitamin C (ASCORBIC ACID) 500 MG tablet Take 500 mg by mouth daily.      . vitamin E 400 UNIT capsule Take 400 Units by mouth daily.       No current facility-administered medications on file prior to visit.  Past Medical History:  Diagnosis Date  . Anemia   . Anxiety   . Depression   . Fibromyalgia   . Hyperglycemia   . Hypertension   . Multiple thyroid nodules    gets ultra sound yearly on thryoid  . Rheumatoid arthritis(714.0)     Past Surgical History:  Procedure Laterality Date  . ABDOMINAL HYSTERECTOMY    . BREAST BIOPSY    . JOINT REPLACEMENT     right knee replacement 2012  . TOTAL KNEE ARTHROPLASTY Left 07/07/2014   Procedure: LEFT TOTAL KNEE ARTHROPLASTY;  Surgeon: Paralee Cancel, MD;  Location: [facility];  Service: Orthopedics;  Laterality: Left;    Social History   Socioeconomic History  . Marital status: Married    Spouse name: Not on file  . Number of children:  Not on file  . Years of education: Not on file  . Highest education level: Not on file  Occupational History  . Not on file  Social Needs  . Financial resource strain: Not on file  . Food insecurity:    Worry: Not on file    Inability: Not on file  . Transportation needs:    Medical: Not on file    Non-medical: Not on file  Tobacco Use  . Smoking status: Never Smoker  . Smokeless tobacco: Never Used  Substance and Sexual Activity  . Alcohol use: No  . Drug use: No  . Sexual activity: Not on file  Lifestyle  . Physical activity:    Days per week: Not on file    Minutes per session: Not on file  . Stress: Not on file  Relationships  . Social connections:    Talks on phone: Not on file    Gets together: Not on file    Attends religious service: Not on file    Active member of club or organization: Not on file    Attends meetings of clubs or organizations: Not on file    Relationship status: Not on file  Other Topics Concern  . Not on file  Social History Narrative   Reg exercise          Family History  Problem Relation Age of Onset  . Cancer Mother        breast  . Arthritis Father   . Hypertension Father   . Diabetes Sister   . Hypertension Brother   . Diabetes Son   . Depression Son     Review of Systems     Objective:  There were no vitals filed for this visit. BP Readings from Last 3 Encounters:  02/23/17 (!) 182/94  08/01/16 (!) 144/94  12/21/15 132/88   Wt Readings from Last 3 Encounters:  02/23/17 150 lb (68 kg)  08/01/16 159 lb (72.1 kg)  12/21/15 173 lb (78.5 kg)   There is no height or weight on file to calculate BMI.   Physical Exam    Constitutional: Appears well-developed and well-nourished. No distress.  HENT:  Head: Normocephalic and atraumatic.  Neck: Neck supple. No tracheal deviation present. No thyromegaly present.  No cervical lymphadenopathy Cardiovascular: Normal rate, regular rhythm and normal heart sounds.   No murmur  heard. No carotid bruit .  No edema Pulmonary/Chest: Effort normal and breath sounds normal. No respiratory distress. No has no wheezes. No rales.  Skin: Skin is warm and dry. Not diaphoretic.  Psychiatric: Normal mood and affect. Behavior is normal.      Assessment & Plan:  See Problem List for Assessment and Plan of chronic medical problems.

## 2017-12-24 ENCOUNTER — Ambulatory Visit: Payer: Self-pay | Admitting: Internal Medicine

## 2017-12-27 ENCOUNTER — Encounter: Payer: Self-pay | Admitting: Internal Medicine

## 2017-12-27 DIAGNOSIS — E1169 Type 2 diabetes mellitus with other specified complication: Secondary | ICD-10-CM | POA: Insufficient documentation

## 2017-12-27 DIAGNOSIS — R7303 Prediabetes: Secondary | ICD-10-CM | POA: Insufficient documentation

## 2017-12-27 DIAGNOSIS — E785 Hyperlipidemia, unspecified: Secondary | ICD-10-CM | POA: Insufficient documentation

## 2017-12-27 NOTE — Progress Notes (Deleted)
Subjective:   Haley Roberts is a 71 y.o. female who presents for an Initial Medicare Annual Wellness Visit.  Review of Systems    No ROS.  Medicare Wellness Visit. Additional risk factors are reflected in the social history.     Sleep patterns: {SX; SLEEP PATTERNS:18802::"feels rested on waking","does not get up to void","gets up *** times nightly to void","sleeps *** hours nightly"}.    Home Safety/Smoke Alarms: Feels safe in home. Smoke alarms in place.  Living environment; residence and Firearm Safety: {Rehab home environment / accessibility:30080::"no firearms","firearms stored safely"}. Seat Belt Safety/Bike Helmet: Wears seat belt.      Objective:    There were no vitals filed for this visit. There is no height or weight on file to calculate BMI.  Advanced Directives 07/07/2014 07/07/2014 07/01/2014  Does Patient Have a Medical Advance Directive? Yes - Yes  Type of Advance Directive Haley Roberts;Living will - Haley Roberts;Living will  Does patient want to make changes to medical advance directive? No - Patient declined - No - Patient declined  Copy of Haley Roberts in Chart? No - copy requested (No Data) No - copy requested    Current Medications (verified) Outpatient Encounter Medications as of 12/28/2017  Medication Sig  . buPROPion (WELLBUTRIN XL) 300 MG 24 hr tablet Take 150 mg by mouth daily.   . Calcium Carbonate-Vit D-Min (CALTRATE PLUS PO) Take 1 tablet by mouth 2 (two) times daily.    . clorazepate (TRANXENE) 3.75 MG tablet Take 7.5-11.25 mg by mouth 2 (two) times daily as needed for anxiety.   Marland Kitchen escitalopram (LEXAPRO) 10 MG tablet Take 10 mg by mouth daily. Dr.Kaur  . ferrous sulfate 325 (65 FE) MG tablet Take 1 tablet (325 mg total) by mouth 3 (three) times daily after meals.  . hydrochlorothiazide (HYDRODIURIL) 12.5 MG tablet Take 1 tablet (12.5 mg total) by mouth daily. -- Office visit needed for further  refills  . HYDROcodone-acetaminophen (NORCO/VICODIN) 5-325 MG tablet TK 1-2 TS PO PRN Q 6 H  . losartan (COZAAR) 100 MG tablet Take 1 tablet (100 mg total) by mouth daily. Need office visit for more refills.  . meloxicam (MOBIC) 15 MG tablet Take 15 mg by mouth daily.  . methocarbamol (ROBAXIN) 500 MG tablet Take 1 tablet (500 mg total) by mouth every 6 (six) hours as needed for muscle spasms.  . Multiple Vitamins-Minerals (WOMENS DAILY FORMULA) TABS Take 1 tablet by mouth daily.    . predniSONE (DELTASONE) 5 MG tablet Take 4 mg by mouth daily. Dr.Anderson   . Tocilizumab (ACTEMRA IV) Inject into the vein once. Once a month  . vitamin C (ASCORBIC ACID) 500 MG tablet Take 500 mg by mouth daily.    . vitamin E 400 UNIT capsule Take 400 Units by mouth daily.     No facility-administered encounter medications on file as of 12/28/2017.     Allergies (verified) Sulfonamide derivatives and Remicade [infliximab]   History: Past Medical History:  Diagnosis Date  . Anemia   . Anxiety   . Depression   . Fibromyalgia   . Hyperglycemia   . Hypertension   . Multiple thyroid nodules    gets ultra sound yearly on thryoid  . Rheumatoid arthritis(714.0)    Past Surgical History:  Procedure Laterality Date  . ABDOMINAL HYSTERECTOMY    . BREAST BIOPSY    . JOINT REPLACEMENT     right knee replacement 2012  . TOTAL KNEE ARTHROPLASTY  Left 07/07/2014   Procedure: LEFT TOTAL KNEE ARTHROPLASTY;  Surgeon: Haley Cancel, MD;  Location: [facility];  Service: Orthopedics;  Laterality: Left;   Family History  Problem Relation Age of Onset  . Cancer Mother        breast  . Arthritis Father   . Hypertension Father   . Diabetes Sister   . Hypertension Brother   . Diabetes Son   . Depression Son    Social History   Socioeconomic History  . Marital status: Married    Spouse name: Not on file  . Number of children: Not on file  . Years of education: Not on file  . Highest education level: Not on file    Occupational History  . Not on file  Social Needs  . Financial resource strain: Not on file  . Food insecurity:    Worry: Not on file    Inability: Not on file  . Transportation needs:    Medical: Not on file    Non-medical: Not on file  Tobacco Use  . Smoking status: Never Smoker  . Smokeless tobacco: Never Used  Substance and Sexual Activity  . Alcohol use: No  . Drug use: No  . Sexual activity: Not on file  Lifestyle  . Physical activity:    Days per week: Not on file    Minutes per session: Not on file  . Stress: Not on file  Relationships  . Social connections:    Talks on phone: Not on file    Gets together: Not on file    Attends religious service: Not on file    Active member of club or organization: Not on file    Attends meetings of clubs or organizations: Not on file    Relationship status: Not on file  Other Topics Concern  . Not on file  Social History Narrative   Reg exercise          Tobacco Counseling Counseling given: Not Answered  Activities of Daily Living No flowsheet data found.   Immunizations and Health Maintenance Immunization History  Administered Date(s) Administered  . Influenza Whole 01/12/2009  . Pneumococcal Conjugate-13 05/28/2015  . Pneumococcal Polysaccharide-23 08/01/2016  . Td 05/17/1998, 03/30/2010   Health Maintenance Due  Topic Date Due  . Hepatitis C Screening  April 13, 1945  . COLONOSCOPY  09/10/1997  . MAMMOGRAM  01/04/2014  . INFLUENZA VACCINE  08/16/2017    Patient Care Team: Haley Rail, MD as PCP - General (Internal Medicine)  Indicate any recent Medical Services you may have received from other than [facility] providers in the past year (date may be approximate).     Assessment:   This is a routine wellness examination for Haley Roberts. Physical assessment deferred to PCP.   Hearing/Vision screen No exam data present  Dietary issues and exercise activities discussed:   Diet (meal preparation, eat out,  water intake, caffeinated beverages, dairy products, fruits and vegetables): {Desc; diets:16563}   Goals   None    Depression Screen PHQ 2/9 Scores 05/28/2015  PHQ - 2 Score 0    Fall Risk Fall Risk  05/28/2015  Falls in the past year? No   Cognitive Function:        Screening Tests Health Maintenance  Topic Date Due  . Hepatitis C Screening  Nov 30, 1945  . COLONOSCOPY  09/10/1997  . MAMMOGRAM  01/04/2014  . INFLUENZA VACCINE  08/16/2017  . TETANUS/TDAP  03/29/2020  . DEXA SCAN  Completed  . PNA  vac Low Risk Adult  Completed     Plan:     I have personally reviewed and noted the following in the patient's chart:   . Medical and social history . Use of alcohol, tobacco or illicit drugs  . Current medications and supplements . Functional ability and status . Nutritional status . Physical activity . Advanced directives . List of other physicians . Vitals . Screenings to include cognitive, depression, and falls . Referrals and appointments  In addition, I have reviewed and discussed with patient certain preventive protocols, quality metrics, and best practice recommendations. A written personalized care plan for preventive services as well as general preventive health recommendations were provided to patient.     Michiel Cowboy, RN   12/27/2017

## 2017-12-27 NOTE — Progress Notes (Deleted)
Subjective:    Patient ID: Haley Roberts, female    DOB: 1945/11/20, 72 y.o.   MRN: [id number]  HPI The patient is here for follow up.  Hypertension: She is taking her medication daily. She is compliant with a low sodium diet.  She denies chest pain, palpitations, edema, shortness of breath and regular headaches. She is exercising regularly.  She does not monitor her blood pressure at home.    Prediabetes:  She is compliant with a low sugar/carbohydrate diet.  She is exercising regularly.    Medications and allergies reviewed with patient and updated if appropriate.  Patient Active Problem List   Diagnosis Date Noted  . Fibromyalgia 05/28/2015  . Overweight (BMI 25.0-29.9) 07/08/2014  . S/P left TKA 07/07/2014  . S/P knee replacement 07/07/2014  . Right knee DJD 08/29/2010  . Rheumatoid arthritis ([place]) 03/30/2010  . PAIN IN JOINT, MULTIPLE SITES 01/14/2009  . Osteoarthritis 01/12/2009  . Non-toxic nodular goiter 08/22/2007  . Essential hypertension 08/22/2007  . HYPERGLYCEMIA, FASTING 08/22/2007  . ANEMIA-NOS 05/31/2006  . Depression 05/31/2006  . ABNORMAL THYROID FUNCTION TESTS 05/31/2006    Current Outpatient Medications on File Prior to Visit  Medication Sig Dispense Refill  . buPROPion (WELLBUTRIN XL) 300 MG 24 hr tablet Take 150 mg by mouth daily.     . Calcium Carbonate-Vit D-Min (CALTRATE PLUS PO) Take 1 tablet by mouth 2 (two) times daily.      . clorazepate (TRANXENE) 3.75 MG tablet Take 7.5-11.25 mg by mouth 2 (two) times daily as needed for anxiety.     Marland Kitchen escitalopram (LEXAPRO) 10 MG tablet Take 10 mg by mouth daily. Dr.Kaur    . ferrous sulfate 325 (65 FE) MG tablet Take 1 tablet (325 mg total) by mouth 3 (three) times daily after meals.  3  . hydrochlorothiazide (HYDRODIURIL) 12.5 MG tablet Take 1 tablet (12.5 mg total) by mouth daily. -- Office visit needed for further refills 90 tablet 0  . HYDROcodone-acetaminophen (NORCO/VICODIN) 5-325 MG tablet TK  1-2 TS PO PRN Q 6 H  0  . losartan (COZAAR) 100 MG tablet Take 1 tablet (100 mg total) by mouth daily. Need office visit for more refills. 30 tablet 0  . meloxicam (MOBIC) 15 MG tablet Take 15 mg by mouth daily.    . methocarbamol (ROBAXIN) 500 MG tablet Take 1 tablet (500 mg total) by mouth every 6 (six) hours as needed for muscle spasms. 50 tablet 0  . Multiple Vitamins-Minerals (WOMENS DAILY FORMULA) TABS Take 1 tablet by mouth daily.      . predniSONE (DELTASONE) 5 MG tablet Take 4 mg by mouth daily. Dr.Anderson     . Tocilizumab (ACTEMRA IV) Inject into the vein once. Once a month    . vitamin C (ASCORBIC ACID) 500 MG tablet Take 500 mg by mouth daily.      . vitamin E 400 UNIT capsule Take 400 Units by mouth daily.       No current facility-administered medications on file prior to visit.     Past Medical History:  Diagnosis Date  . Anemia   . Anxiety   . Depression   . Fibromyalgia   . Hyperglycemia   . Hypertension   . Multiple thyroid nodules    gets ultra sound yearly on thryoid  . Rheumatoid arthritis(714.0)     Past Surgical History:  Procedure Laterality Date  . ABDOMINAL HYSTERECTOMY    . BREAST BIOPSY    . JOINT REPLACEMENT  right knee replacement 2012  . TOTAL KNEE ARTHROPLASTY Left 07/07/2014   Procedure: LEFT TOTAL KNEE ARTHROPLASTY;  Surgeon: Paralee Cancel, MD;  Location: [facility];  Service: Orthopedics;  Laterality: Left;    Social History   Socioeconomic History  . Marital status: Married    Spouse name: Not on file  . Number of children: Not on file  . Years of education: Not on file  . Highest education level: Not on file  Occupational History  . Not on file  Social Needs  . Financial resource strain: Not on file  . Food insecurity:    Worry: Not on file    Inability: Not on file  . Transportation needs:    Medical: Not on file    Non-medical: Not on file  Tobacco Use  . Smoking status: Never Smoker  . Smokeless tobacco: Never Used    Substance and Sexual Activity  . Alcohol use: No  . Drug use: No  . Sexual activity: Not on file  Lifestyle  . Physical activity:    Days per week: Not on file    Minutes per session: Not on file  . Stress: Not on file  Relationships  . Social connections:    Talks on phone: Not on file    Gets together: Not on file    Attends religious service: Not on file    Active member of club or organization: Not on file    Attends meetings of clubs or organizations: Not on file    Relationship status: Not on file  Other Topics Concern  . Not on file  Social History Narrative   Reg exercise          Family History  Problem Relation Age of Onset  . Cancer Mother        breast  . Arthritis Father   . Hypertension Father   . Diabetes Sister   . Hypertension Brother   . Diabetes Son   . Depression Son     Review of Systems     Objective:  There were no vitals filed for this visit. BP Readings from Last 3 Encounters:  02/23/17 (!) 182/94  08/01/16 (!) 144/94  12/21/15 132/88   Wt Readings from Last 3 Encounters:  02/23/17 150 lb (68 kg)  08/01/16 159 lb (72.1 kg)  12/21/15 173 lb (78.5 kg)   There is no height or weight on file to calculate BMI.   Physical Exam    Constitutional: Appears well-developed and well-nourished. No distress.  HENT:  Head: Normocephalic and atraumatic.  Neck: Neck supple. No tracheal deviation present. No thyromegaly present.  No cervical lymphadenopathy Cardiovascular: Normal rate, regular rhythm and normal heart sounds.   No murmur heard. No carotid bruit .  No edema Pulmonary/Chest: Effort normal and breath sounds normal. No respiratory distress. No has no wheezes. No rales.  Skin: Skin is warm and dry. Not diaphoretic.  Psychiatric: Normal mood and affect. Behavior is normal.      Assessment & Plan:    See Problem List for Assessment and Plan of chronic medical problems.

## 2017-12-28 ENCOUNTER — Ambulatory Visit: Payer: Self-pay | Admitting: Internal Medicine

## 2017-12-28 ENCOUNTER — Ambulatory Visit: Payer: Self-pay

## 2017-12-31 NOTE — Progress Notes (Deleted)
Subjective:    Patient ID: Haley Roberts, female    DOB: [birth year], 72 y.o.   MRN: [id number]  HPI The patient is here for follow up.  Hypertension: She is taking her medication daily. She is compliant with a low sodium diet.  She denies chest pain, palpitations, edema, shortness of breath and regular headaches. She is exercising regularly.  She does not monitor her blood pressure at home.    Prediabetes:  She is compliant with a low sugar/carbohydrate diet.  She is exercising regularly.    Medications and allergies reviewed with patient and updated if appropriate.  Patient Active Problem List   Diagnosis Date Noted  . Prediabetes 12/27/2017  . Fibromyalgia 05/28/2015  . Overweight (BMI 25.0-29.9) 07/08/2014  . S/P left TKA 07/07/2014  . S/P knee replacement 07/07/2014  . Right knee DJD 08/29/2010  . Rheumatoid arthritis ([place]) 03/30/2010  . PAIN IN JOINT, MULTIPLE SITES 01/14/2009  . Osteoarthritis 01/12/2009  . Non-toxic nodular goiter 08/22/2007  . Essential hypertension 08/22/2007  . ANEMIA-NOS 05/31/2006  . Depression 05/31/2006  . ABNORMAL THYROID FUNCTION TESTS 05/31/2006    Current Outpatient Medications on File Prior to Visit  Medication Sig Dispense Refill  . buPROPion (WELLBUTRIN XL) 300 MG 24 hr tablet Take 150 mg by mouth daily.     . Calcium Carbonate-Vit D-Min (CALTRATE PLUS PO) Take 1 tablet by mouth 2 (two) times daily.      . clorazepate (TRANXENE) 3.75 MG tablet Take 7.5-11.25 mg by mouth 2 (two) times daily as needed for anxiety.     Marland Kitchen escitalopram (LEXAPRO) 10 MG tablet Take 10 mg by mouth daily. Dr.Kaur    . ferrous sulfate 325 (65 FE) MG tablet Take 1 tablet (325 mg total) by mouth 3 (three) times daily after meals.  3  . hydrochlorothiazide (HYDRODIURIL) 12.5 MG tablet Take 1 tablet (12.5 mg total) by mouth daily. -- Office visit needed for further refills 90 tablet 0  . HYDROcodone-acetaminophen (NORCO/VICODIN) 5-325 MG tablet TK 1-2 TS PO PRN  Q 6 H  0  . losartan (COZAAR) 100 MG tablet Take 1 tablet (100 mg total) by mouth daily. Need office visit for more refills. 30 tablet 0  . meloxicam (MOBIC) 15 MG tablet Take 15 mg by mouth daily.    . methocarbamol (ROBAXIN) 500 MG tablet Take 1 tablet (500 mg total) by mouth every 6 (six) hours as needed for muscle spasms. 50 tablet 0  . Multiple Vitamins-Minerals (WOMENS DAILY FORMULA) TABS Take 1 tablet by mouth daily.      . predniSONE (DELTASONE) 5 MG tablet Take 4 mg by mouth daily. Dr.Anderson     . Tocilizumab (ACTEMRA IV) Inject into the vein once. Once a month    . vitamin C (ASCORBIC ACID) 500 MG tablet Take 500 mg by mouth daily.      . vitamin E 400 UNIT capsule Take 400 Units by mouth daily.       No current facility-administered medications on file prior to visit.     Past Medical History:  Diagnosis Date  . Anemia   . Anxiety   . Depression   . Fibromyalgia   . Hyperglycemia   . HYPERGLYCEMIA, FASTING 08/22/2007  . Hypertension   . Multiple thyroid nodules    gets ultra sound yearly on thryoid  . Rheumatoid arthritis(714.0)     Past Surgical History:  Procedure Laterality Date  . ABDOMINAL HYSTERECTOMY    . BREAST BIOPSY    .  JOINT REPLACEMENT     right knee replacement 2012  . TOTAL KNEE ARTHROPLASTY Left 07/07/2014   Procedure: LEFT TOTAL KNEE ARTHROPLASTY;  Surgeon: Paralee Cancel, MD;  Location: [facility];  Service: Orthopedics;  Laterality: Left;    Social History   Socioeconomic History  . Marital status: Married    Spouse name: Not on file  . Number of children: Not on file  . Years of education: Not on file  . Highest education level: Not on file  Occupational History  . Not on file  Social Needs  . Financial resource strain: Not on file  . Food insecurity:    Worry: Not on file    Inability: Not on file  . Transportation needs:    Medical: Not on file    Non-medical: Not on file  Tobacco Use  . Smoking status: Never Smoker  . Smokeless  tobacco: Never Used  Substance and Sexual Activity  . Alcohol use: No  . Drug use: No  . Sexual activity: Not on file  Lifestyle  . Physical activity:    Days per week: Not on file    Minutes per session: Not on file  . Stress: Not on file  Relationships  . Social connections:    Talks on phone: Not on file    Gets together: Not on file    Attends religious service: Not on file    Active member of club or organization: Not on file    Attends meetings of clubs or organizations: Not on file    Relationship status: Not on file  Other Topics Concern  . Not on file  Social History Narrative   Reg exercise          Family History  Problem Relation Age of Onset  . Cancer Mother        breast  . Arthritis Father   . Hypertension Father   . Diabetes Sister   . Hypertension Brother   . Diabetes Son   . Depression Son     Review of Systems     Objective:  There were no vitals filed for this visit. BP Readings from Last 3 Encounters:  02/23/17 (!) 182/94  08/01/16 (!) 144/94  12/21/15 132/88   Wt Readings from Last 3 Encounters:  02/23/17 150 lb (68 kg)  08/01/16 159 lb (72.1 kg)  12/21/15 173 lb (78.5 kg)   There is no height or weight on file to calculate BMI.   Physical Exam    Constitutional: Appears well-developed and well-nourished. No distress.  HENT:  Head: Normocephalic and atraumatic.  Neck: Neck supple. No tracheal deviation present. No thyromegaly present.  No cervical lymphadenopathy Cardiovascular: Normal rate, regular rhythm and normal heart sounds.   No murmur heard. No carotid bruit .  No edema Pulmonary/Chest: Effort normal and breath sounds normal. No respiratory distress. No has no wheezes. No rales.  Skin: Skin is warm and dry. Not diaphoretic.  Psychiatric: Normal mood and affect. Behavior is normal.      Assessment & Plan:    See Problem List for Assessment and Plan of chronic medical problems.

## 2018-01-01 ENCOUNTER — Ambulatory Visit: Payer: Self-pay | Admitting: Internal Medicine

## 2018-01-01 MED ORDER — LOSARTAN POTASSIUM 100 MG PO TABS: 100.0000 mg | ORAL_TABLET | Freq: Every day | ORAL | 0 refills | Status: DC

## 2018-01-01 NOTE — Addendum Note (Signed)
Addended by: Delice Bison E on: 01/01/2018 01:38 PM   Modules accepted: Orders

## 2018-01-01 NOTE — Telephone Encounter (Signed)
Pts son calling to r/s moms appt due to her husband has ALS and not doing well right now and she is his caregiver. Son requesting for enough med refill to be sent in to the pharmacy to last until 01/18/18. Please advise.

## 2018-01-01 NOTE — Telephone Encounter (Signed)
Rx sent. Pt is aware.  °

## 2018-01-17 NOTE — Progress Notes (Deleted)
Subjective:    Patient ID: Haley Roberts, female    DOB: 09-30-1945, 73 y.o.   MRN: [id number]  HPI The patient is here for follow up.  Hypertension: She is taking her medication daily. She is compliant with a low sodium diet.  She denies chest pain, palpitations, edema, shortness of breath and regular headaches. She is exercising regularly.  She does not monitor her blood pressure at home.    Prediabetes:  She is compliant with a low sugar/carbohydrate diet.  She is exercising regularly.  RA:  She is following with rheumatology.    Medications and allergies reviewed with patient and updated if appropriate.  Patient Active Problem List   Diagnosis Date Noted  . Prediabetes 12/27/2017  . Fibromyalgia 05/28/2015  . Overweight (BMI 25.0-29.9) 07/08/2014  . S/P left TKA 07/07/2014  . S/P knee replacement 07/07/2014  . Right knee DJD 08/29/2010  . Rheumatoid arthritis ([place]) 03/30/2010  . PAIN IN JOINT, MULTIPLE SITES 01/14/2009  . Osteoarthritis 01/12/2009  . Non-toxic nodular goiter 08/22/2007  . Essential hypertension 08/22/2007  . ANEMIA-NOS 05/31/2006  . Depression 05/31/2006  . ABNORMAL THYROID FUNCTION TESTS 05/31/2006    Current Outpatient Medications on File Prior to Visit  Medication Sig Dispense Refill  . buPROPion (WELLBUTRIN XL) 300 MG 24 hr tablet Take 150 mg by mouth daily.     . Calcium Carbonate-Vit D-Min (CALTRATE PLUS PO) Take 1 tablet by mouth 2 (two) times daily.      . clorazepate (TRANXENE) 3.75 MG tablet Take 7.5-11.25 mg by mouth 2 (two) times daily as needed for anxiety.     Marland Kitchen escitalopram (LEXAPRO) 10 MG tablet Take 10 mg by mouth daily. Dr.Kaur    . ferrous sulfate 325 (65 FE) MG tablet Take 1 tablet (325 mg total) by mouth 3 (three) times daily after meals.  3  . hydrochlorothiazide (HYDRODIURIL) 12.5 MG tablet Take 1 tablet (12.5 mg total) by mouth daily. -- Office visit needed for further refills 90 tablet 0  . HYDROcodone-acetaminophen  (NORCO/VICODIN) 5-325 MG tablet TK 1-2 TS PO PRN Q 6 H  0  . losartan (COZAAR) 100 MG tablet Take 1 tablet (100 mg total) by mouth daily. Need office visit for more refills. 30 tablet 0  . meloxicam (MOBIC) 15 MG tablet Take 15 mg by mouth daily.    . methocarbamol (ROBAXIN) 500 MG tablet Take 1 tablet (500 mg total) by mouth every 6 (six) hours as needed for muscle spasms. 50 tablet 0  . Multiple Vitamins-Minerals (WOMENS DAILY FORMULA) TABS Take 1 tablet by mouth daily.      . predniSONE (DELTASONE) 5 MG tablet Take 4 mg by mouth daily. Dr.Anderson     . Tocilizumab (ACTEMRA IV) Inject into the vein once. Once a month    . vitamin C (ASCORBIC ACID) 500 MG tablet Take 500 mg by mouth daily.      . vitamin E 400 UNIT capsule Take 400 Units by mouth daily.       No current facility-administered medications on file prior to visit.     Past Medical History:  Diagnosis Date  . Anemia   . Anxiety   . Depression   . Fibromyalgia   . Hyperglycemia   . HYPERGLYCEMIA, FASTING 08/22/2007  . Hypertension   . Multiple thyroid nodules    gets ultra sound yearly on thryoid  . Rheumatoid arthritis(714.0)     Past Surgical History:  Procedure Laterality Date  . ABDOMINAL HYSTERECTOMY    .  BREAST BIOPSY    . JOINT REPLACEMENT     right knee replacement 2012  . TOTAL KNEE ARTHROPLASTY Left 07/07/2014   Procedure: LEFT TOTAL KNEE ARTHROPLASTY;  Surgeon: Paralee Cancel, MD;  Location: [facility];  Service: Orthopedics;  Laterality: Left;    Social History   Socioeconomic History  . Marital status: Married    Spouse name: Not on file  . Number of children: Not on file  . Years of education: Not on file  . Highest education level: Not on file  Occupational History  . Not on file  Social Needs  . Financial resource strain: Not on file  . Food insecurity:    Worry: Not on file    Inability: Not on file  . Transportation needs:    Medical: Not on file    Non-medical: Not on file  Tobacco Use  .  Smoking status: Never Smoker  . Smokeless tobacco: Never Used  Substance and Sexual Activity  . Alcohol use: No  . Drug use: No  . Sexual activity: Not on file  Lifestyle  . Physical activity:    Days per week: Not on file    Minutes per session: Not on file  . Stress: Not on file  Relationships  . Social connections:    Talks on phone: Not on file    Gets together: Not on file    Attends religious service: Not on file    Active member of club or organization: Not on file    Attends meetings of clubs or organizations: Not on file    Relationship status: Not on file  Other Topics Concern  . Not on file  Social History Narrative   Reg exercise          Family History  Problem Relation Age of Onset  . Cancer Mother        breast  . Arthritis Father   . Hypertension Father   . Diabetes Sister   . Hypertension Brother   . Diabetes Son   . Depression Son     Review of Systems     Objective:  There were no vitals filed for this visit. BP Readings from Last 3 Encounters:  02/23/17 (!) 182/94  08/01/16 (!) 144/94  12/21/15 132/88   Wt Readings from Last 3 Encounters:  02/23/17 150 lb (68 kg)  08/01/16 159 lb (72.1 kg)  12/21/15 173 lb (78.5 kg)   There is no height or weight on file to calculate BMI.   Physical Exam    Constitutional: Appears well-developed and well-nourished. No distress.  HENT:  Head: Normocephalic and atraumatic.  Neck: Neck supple. No tracheal deviation present. No thyromegaly present.  No cervical lymphadenopathy Cardiovascular: Normal rate, regular rhythm and normal heart sounds.   No murmur heard. No carotid bruit .  No edema Pulmonary/Chest: Effort normal and breath sounds normal. No respiratory distress. No has no wheezes. No rales.  Skin: Skin is warm and dry. Not diaphoretic.  Psychiatric: Normal mood and affect. Behavior is normal.      Assessment & Plan:    See Problem List for Assessment and Plan of chronic medical  problems.

## 2018-01-18 ENCOUNTER — Ambulatory Visit: Payer: Medicare Other | Admitting: Internal Medicine

## 2018-01-22 ENCOUNTER — Ambulatory Visit: Payer: Self-pay | Admitting: Internal Medicine

## 2018-01-27 NOTE — Progress Notes (Deleted)
Subjective:    Patient ID: Haley Roberts, female    DOB: 12-24-1945, 73 y.o.   MRN: [id number]  HPI The patient is here for follow up.  Hypertension: She is taking her medication daily. She is compliant with a low sodium diet.  She denies chest pain, palpitations, edema, shortness of breath and regular headaches. She is exercising regularly.  She does not monitor her blood pressure at home.    Prediabetes:  She is compliant with a low sugar/carbohydrate diet.  She is exercising regularly.  RA:  She is following with rheumatology.    Medications and allergies reviewed with patient and updated if appropriate.  Patient Active Problem List   Diagnosis Date Noted  . Prediabetes 12/27/2017  . Fibromyalgia 05/28/2015  . Overweight (BMI 25.0-29.9) 07/08/2014  . S/P left TKA 07/07/2014  . S/P knee replacement 07/07/2014  . Right knee DJD 08/29/2010  . Rheumatoid arthritis ([place]) 03/30/2010  . PAIN IN JOINT, MULTIPLE SITES 01/14/2009  . Osteoarthritis 01/12/2009  . Non-toxic nodular goiter 08/22/2007  . Essential hypertension 08/22/2007  . ANEMIA-NOS 05/31/2006  . Depression 05/31/2006  . ABNORMAL THYROID FUNCTION TESTS 05/31/2006    Current Outpatient Medications on File Prior to Visit  Medication Sig Dispense Refill  . buPROPion (WELLBUTRIN XL) 300 MG 24 hr tablet Take 150 mg by mouth daily.     . Calcium Carbonate-Vit D-Min (CALTRATE PLUS PO) Take 1 tablet by mouth 2 (two) times daily.      . clorazepate (TRANXENE) 3.75 MG tablet Take 7.5-11.25 mg by mouth 2 (two) times daily as needed for anxiety.     Marland Kitchen escitalopram (LEXAPRO) 10 MG tablet Take 10 mg by mouth daily. Dr.Kaur    . ferrous sulfate 325 (65 FE) MG tablet Take 1 tablet (325 mg total) by mouth 3 (three) times daily after meals.  3  . hydrochlorothiazide (HYDRODIURIL) 12.5 MG tablet Take 1 tablet (12.5 mg total) by mouth daily. -- Office visit needed for further refills 90 tablet 0  . HYDROcodone-acetaminophen  (NORCO/VICODIN) 5-325 MG tablet TK 1-2 TS PO PRN Q 6 H  0  . losartan (COZAAR) 100 MG tablet Take 1 tablet (100 mg total) by mouth daily. Need office visit for more refills. 30 tablet 0  . meloxicam (MOBIC) 15 MG tablet Take 15 mg by mouth daily.    . methocarbamol (ROBAXIN) 500 MG tablet Take 1 tablet (500 mg total) by mouth every 6 (six) hours as needed for muscle spasms. 50 tablet 0  . Multiple Vitamins-Minerals (WOMENS DAILY FORMULA) TABS Take 1 tablet by mouth daily.      . predniSONE (DELTASONE) 5 MG tablet Take 4 mg by mouth daily. Dr.Anderson     . Tocilizumab (ACTEMRA IV) Inject into the vein once. Once a month    . vitamin C (ASCORBIC ACID) 500 MG tablet Take 500 mg by mouth daily.      . vitamin E 400 UNIT capsule Take 400 Units by mouth daily.       No current facility-administered medications on file prior to visit.     Past Medical History:  Diagnosis Date  . Anemia   . Anxiety   . Depression   . Fibromyalgia   . Hyperglycemia   . HYPERGLYCEMIA, FASTING 08/22/2007  . Hypertension   . Multiple thyroid nodules    gets ultra sound yearly on thryoid  . Rheumatoid arthritis(714.0)     Past Surgical History:  Procedure Laterality Date  . ABDOMINAL HYSTERECTOMY    .  BREAST BIOPSY    . JOINT REPLACEMENT     right knee replacement 2012  . TOTAL KNEE ARTHROPLASTY Left 07/07/2014   Procedure: LEFT TOTAL KNEE ARTHROPLASTY;  Surgeon: Paralee Cancel, MD;  Location: [facility];  Service: Orthopedics;  Laterality: Left;    Social History   Socioeconomic History  . Marital status: Married    Spouse name: Not on file  . Number of children: Not on file  . Years of education: Not on file  . Highest education level: Not on file  Occupational History  . Not on file  Social Needs  . Financial resource strain: Not on file  . Food insecurity:    Worry: Not on file    Inability: Not on file  . Transportation needs:    Medical: Not on file    Non-medical: Not on file  Tobacco Use  .  Smoking status: Never Smoker  . Smokeless tobacco: Never Used  Substance and Sexual Activity  . Alcohol use: No  . Drug use: No  . Sexual activity: Not on file  Lifestyle  . Physical activity:    Days per week: Not on file    Minutes per session: Not on file  . Stress: Not on file  Relationships  . Social connections:    Talks on phone: Not on file    Gets together: Not on file    Attends religious service: Not on file    Active member of club or organization: Not on file    Attends meetings of clubs or organizations: Not on file    Relationship status: Not on file  Other Topics Concern  . Not on file  Social History Narrative   Reg exercise          Family History  Problem Relation Age of Onset  . Cancer Mother        breast  . Arthritis Father   . Hypertension Father   . Diabetes Sister   . Hypertension Brother   . Diabetes Son   . Depression Son     Review of Systems     Objective:  There were no vitals filed for this visit. BP Readings from Last 3 Encounters:  02/23/17 (!) 182/94  08/01/16 (!) 144/94  12/21/15 132/88   Wt Readings from Last 3 Encounters:  02/23/17 150 lb (68 kg)  08/01/16 159 lb (72.1 kg)  12/21/15 173 lb (78.5 kg)   There is no height or weight on file to calculate BMI.   Physical Exam    Constitutional: Appears well-developed and well-nourished. No distress.  HENT:  Head: Normocephalic and atraumatic.  Neck: Neck supple. No tracheal deviation present. No thyromegaly present.  No cervical lymphadenopathy Cardiovascular: Normal rate, regular rhythm and normal heart sounds.   No murmur heard. No carotid bruit .  No edema Pulmonary/Chest: Effort normal and breath sounds normal. No respiratory distress. No has no wheezes. No rales.  Skin: Skin is warm and dry. Not diaphoretic.  Psychiatric: Normal mood and affect. Behavior is normal.      Assessment & Plan:    See Problem List for Assessment and Plan of chronic medical  problems.

## 2018-01-28 ENCOUNTER — Ambulatory Visit: Payer: Self-pay | Admitting: Internal Medicine

## 2018-02-05 ENCOUNTER — Other Ambulatory Visit: Payer: Self-pay | Admitting: Internal Medicine

## 2018-02-05 ENCOUNTER — Ambulatory Visit: Payer: Medicare Other | Admitting: Internal Medicine

## 2018-02-12 ENCOUNTER — Ambulatory Visit: Payer: Medicare Other | Admitting: Internal Medicine

## 2018-02-22 ENCOUNTER — Ambulatory Visit: Payer: Medicare Other | Admitting: Internal Medicine

## 2018-03-06 ENCOUNTER — Other Ambulatory Visit: Payer: Self-pay | Admitting: Internal Medicine

## 2018-03-06 MED ORDER — LOSARTAN POTASSIUM 100 MG PO TABS: ORAL_TABLET | ORAL | 0 refills | Status: DC

## 2018-03-06 NOTE — Telephone Encounter (Signed)
Copied from [place] #[id number]. Topic: Quick Communication - Rx Refill/Question >> Mar 06, 2018  1:10 PM Burchel, Abbi R wrote: Medication: losartan (COZAAR) 100 MG tablet   Preferred Pharmacy: [facility] [place], [place] - [address] AT [address] [place] [address] Phone: [phone] Fax: [phone] Not a 24 hour pharmacy; exact hours not known.    Pt needs a few pills to get her to her app on 03/13/2018  Pt was  advised that RX refills may take up to 3 business days. We ask that you follow-up with your pharmacy.

## 2018-03-06 NOTE — Telephone Encounter (Signed)
Pt is >3 months overdue pt given 7 days worth of medication until appt in 7 days- Courtesy refill. Requested Prescriptions  Pending Prescriptions Disp Refills  . losartan (COZAAR) 100 MG tablet 7 tablet 0    Sig: Take 1 tablet (100 mg) by mouth daily. Need office visit for more refills.     Cardiovascular:  Angiotensin Receptor Blockers Failed - 03/06/2018  1:12 PM      Failed - Cr in normal range and within 180 days    Creatinine  Date Value Ref Range Status  03/02/2016 0.8 0.5 - 1.1 mg/dL Final   Creatinine, Ser  Date Value Ref Range Status  05/28/2015 0.89 0.40 - 1.20 mg/dL Final         Failed - K in normal range and within 180 days    Potassium  Date Value Ref Range Status  03/02/2016 5.0 3.4 - 5.3 mmol/L Final         Failed - Last BP in normal range    BP Readings from Last 1 Encounters:  02/23/17 (!) 182/94         Failed - Valid encounter within last 6 months    Recent Outpatient Visits          1 year ago Upper respiratory tract infection, unspecified type   [place], MD   1 year ago Essential hypertension   [place], Stacy J, MD   2 years ago Acute non-recurrent pansinusitis   [place], Charlene Brooke, NP   2 years ago Essential hypertension   [place], Claudina Lick, MD   3 years ago Viral URI with cough   [place], MD      Future Appointments            In 1 week Burns, Claudina Lick, MD [place], [place] - Patient is not pregnant

## 2018-03-13 ENCOUNTER — Ambulatory Visit: Payer: Medicare Other | Admitting: Internal Medicine

## 2018-03-13 MED ORDER — LOSARTAN POTASSIUM 100 MG PO TABS: ORAL_TABLET | ORAL | 0 refills | Status: DC

## 2018-03-13 NOTE — Telephone Encounter (Signed)
Pt states she was unable to make appt today, rescheduled to 03/20/18. She states her husband has ALS and she is his full time caregiver, and no one was able to stay with him.  But she has made arrangements for someone t sit with him March 4 , if she could please get enough until then.  losartan (COZAAR) 100 MG tablet  [facility] #[id number] [place], [place] - [address] AT [address] [phone] (Phone) [phone] (Fax)

## 2018-03-13 NOTE — Addendum Note (Signed)
Addended by: Valli Glance F on: 03/13/2018 01:15 PM   Modules accepted: Orders

## 2018-03-20 ENCOUNTER — Ambulatory Visit: Payer: Medicare Other | Admitting: Internal Medicine

## 2018-03-20 ENCOUNTER — Other Ambulatory Visit: Payer: Self-pay | Admitting: Internal Medicine

## 2018-03-27 ENCOUNTER — Telehealth: Payer: Self-pay | Admitting: Internal Medicine

## 2018-03-27 ENCOUNTER — Ambulatory Visit: Payer: Medicare Other | Admitting: Internal Medicine

## 2018-03-27 MED ORDER — LOSARTAN POTASSIUM 100 MG PO TABS: ORAL_TABLET | ORAL | 0 refills | Status: DC

## 2018-03-27 NOTE — Telephone Encounter (Signed)
Yes, ok to refill 

## 2018-03-27 NOTE — Telephone Encounter (Signed)
Pls advise if ok to refill../lmb 

## 2018-03-27 NOTE — Telephone Encounter (Signed)
Per office policy sent 30 day to local pharmacy until appt.../lmb  

## 2018-03-27 NOTE — Telephone Encounter (Signed)
Copied from [place] [id number]. Topic: General - Other >> Mar 27, 2018 10:23 AM Lennox Solders wrote: Reason for CRM:  pt is unable to come in today. Pt husband has ALS and she does not want anyone in her house. Pt has resch her appt until 05/03/2018. Pt needs a refill on losartan [facility]. Pt and her husband are in their 31's

## 2018-04-04 ENCOUNTER — Encounter: Payer: Self-pay | Admitting: Internal Medicine

## 2018-04-25 ENCOUNTER — Other Ambulatory Visit: Payer: Self-pay | Admitting: Internal Medicine

## 2018-05-02 NOTE — Progress Notes (Signed)
Virtual Visit via Video Note  I connected with Haley Roberts on 05/02/18 at 11:00 AM EDT by a video enabled telemedicine application and verified that I am speaking with the correct person using two identifiers.   I discussed the limitations of evaluation and management by telemedicine and the availability of in person appointments. The patient expressed understanding and agreed to proceed.  The patient is currently at home and I am in the office.    No referring provider.    History of Present Illness: She is here for follow up of her chronic medical conditions.   She is not exercising regularly due to her joint pain.     Hypertension: She is taking her medication daily. She is compliant with a low sodium diet.  She denies chest pain, palpitations, edema, shortness of breath and regular headaches. She does have her son check her blood pressure at home at times.  Most recently it was 137/88.  She states it is always been less than 140/90 when it is checked at home or at her rheumatology appointments.      Fibromyalgia, RA, OA:  She follows with rheumatology.  Since the coronavirus situation occurred she has not been able to have any infusions and her pain is increased.  She feels she is dealing with it okay.  Fluid retention/edema: She takes hydrochlorothiazide as needed only.  She feels this is controlling her fluid.  Depression: She follows with psychiatry.  She feels her depression is currently controlled.  Prediabetes:  She is compliant with a low sugar/carbohydrate diet.  She has been eating more than usual and has gained a little weight just because she is cooped up at home.  She plans on working on losing the extra weight she has gained.  She is not exercising regularly.    Social History   Socioeconomic History  . Marital status: Married    Spouse name: Not on file  . Number of children: Not on file  . Years of education: Not on file  . Highest education level: Not on  file  Occupational History  . Not on file  Social Needs  . Financial resource strain: Not on file  . Food insecurity:    Worry: Not on file    Inability: Not on file  . Transportation needs:    Medical: Not on file    Non-medical: Not on file  Tobacco Use  . Smoking status: Never Smoker  . Smokeless tobacco: Never Used  Substance and Sexual Activity  . Alcohol use: No  . Drug use: No  . Sexual activity: Not on file  Lifestyle  . Physical activity:    Days per week: Not on file    Minutes per session: Not on file  . Stress: Not on file  Relationships  . Social connections:    Talks on phone: Not on file    Gets together: Not on file    Attends religious service: Not on file    Active member of club or organization: Not on file    Attends meetings of clubs or organizations: Not on file    Relationship status: Not on file  Other Topics Concern  . Not on file  Social History Narrative   Reg exercise           Observations/Objective: Appears well in NAD Mood and affect normal  Assessment and Plan:  See Problem List for Assessment and Plan of chronic medical problems.   Follow Up Instructions:  I discussed the assessment and treatment plan with the patient. The patient was provided an opportunity to ask questions and all were answered. The patient agreed with the plan and demonstrated an understanding of the instructions.   The patient was advised to call back or seek an in-person evaluation if the symptoms worsen or if the condition fails to improve as anticipated.    Binnie Rail, MD

## 2018-05-03 ENCOUNTER — Encounter: Payer: Self-pay | Admitting: Internal Medicine

## 2018-05-03 ENCOUNTER — Ambulatory Visit (INDEPENDENT_AMBULATORY_CARE_PROVIDER_SITE_OTHER): Payer: Medicare Other | Admitting: Internal Medicine

## 2018-05-03 DIAGNOSIS — R600 Localized edema: Secondary | ICD-10-CM | POA: Diagnosis not present

## 2018-05-03 DIAGNOSIS — F3289 Other specified depressive episodes: Secondary | ICD-10-CM | POA: Diagnosis not present

## 2018-05-03 DIAGNOSIS — M069 Rheumatoid arthritis, unspecified: Secondary | ICD-10-CM

## 2018-05-03 DIAGNOSIS — R609 Edema, unspecified: Secondary | ICD-10-CM | POA: Insufficient documentation

## 2018-05-03 DIAGNOSIS — R7303 Prediabetes: Secondary | ICD-10-CM | POA: Diagnosis not present

## 2018-05-03 DIAGNOSIS — I10 Essential (primary) hypertension: Secondary | ICD-10-CM

## 2018-05-03 MED ORDER — LOSARTAN POTASSIUM 100 MG PO TABS: 100.0000 mg | ORAL_TABLET | Freq: Every day | ORAL | 3 refills | Status: DC

## 2018-05-03 MED ORDER — HYDROCHLOROTHIAZIDE 12.5 MG PO TABS: 12.5000 mg | ORAL_TABLET | Freq: Every day | ORAL | 3 refills | Status: DC

## 2018-05-03 NOTE — Assessment & Plan Note (Signed)
Blood pressure reasonably controlled at home-less than 140/90 consistently Continue losartan daily Takes hydrochlorothiazide as needed for fluid retention, continue

## 2018-05-03 NOTE — Assessment & Plan Note (Signed)
Follows with Dr. Amil Amen Has not been able to get her infusions because of the coronavirus situation and pain has been higher than usual She feels she is dealing with it okay Management per rheumatology

## 2018-05-03 NOTE — Assessment & Plan Note (Signed)
Controlled, stable Management per psychiatry

## 2018-05-03 NOTE — Assessment & Plan Note (Signed)
Lab Results  Component Value Date   HGBA1C 6.0 01/12/2009   Sugars have been stable She is fairly compliant with a low sugar/carbohydrate diet She is not exercising regularly-encouraged as much activity as tolerated We will hold off on rechecking A1c at this time especially since she has been fairly stable

## 2018-05-03 NOTE — Assessment & Plan Note (Signed)
Has mild, intermittent fluid retention Takes hydrochlorothiazide as needed Overall controlled Continue

## 2018-05-27 DIAGNOSIS — E663 Overweight: Secondary | ICD-10-CM | POA: Diagnosis not present

## 2018-05-27 DIAGNOSIS — M5136 Other intervertebral disc degeneration, lumbar region: Secondary | ICD-10-CM | POA: Diagnosis not present

## 2018-05-27 DIAGNOSIS — Z6825 Body mass index (BMI) 25.0-25.9, adult: Secondary | ICD-10-CM | POA: Diagnosis not present

## 2018-05-27 DIAGNOSIS — M797 Fibromyalgia: Secondary | ICD-10-CM | POA: Diagnosis not present

## 2018-05-27 DIAGNOSIS — M150 Primary generalized (osteo)arthritis: Secondary | ICD-10-CM | POA: Diagnosis not present

## 2018-05-27 DIAGNOSIS — M0589 Other rheumatoid arthritis with rheumatoid factor of multiple sites: Secondary | ICD-10-CM | POA: Diagnosis not present

## 2018-05-27 DIAGNOSIS — M25421 Effusion, right elbow: Secondary | ICD-10-CM | POA: Diagnosis not present

## 2018-08-15 ENCOUNTER — Other Ambulatory Visit: Payer: Self-pay

## 2018-10-16 DIAGNOSIS — M5136 Other intervertebral disc degeneration, lumbar region: Secondary | ICD-10-CM | POA: Diagnosis not present

## 2018-10-16 DIAGNOSIS — M150 Primary generalized (osteo)arthritis: Secondary | ICD-10-CM | POA: Diagnosis not present

## 2018-10-16 DIAGNOSIS — M797 Fibromyalgia: Secondary | ICD-10-CM | POA: Diagnosis not present

## 2018-10-16 DIAGNOSIS — M25421 Effusion, right elbow: Secondary | ICD-10-CM | POA: Diagnosis not present

## 2018-10-16 DIAGNOSIS — M0589 Other rheumatoid arthritis with rheumatoid factor of multiple sites: Secondary | ICD-10-CM | POA: Diagnosis not present

## 2018-12-06 ENCOUNTER — Other Ambulatory Visit: Payer: Self-pay

## 2019-03-24 DIAGNOSIS — M5136 Other intervertebral disc degeneration, lumbar region: Secondary | ICD-10-CM | POA: Diagnosis not present

## 2019-03-24 DIAGNOSIS — M0589 Other rheumatoid arthritis with rheumatoid factor of multiple sites: Secondary | ICD-10-CM | POA: Diagnosis not present

## 2019-03-24 DIAGNOSIS — M25421 Effusion, right elbow: Secondary | ICD-10-CM | POA: Diagnosis not present

## 2019-03-24 DIAGNOSIS — M150 Primary generalized (osteo)arthritis: Secondary | ICD-10-CM | POA: Diagnosis not present

## 2019-03-24 DIAGNOSIS — M797 Fibromyalgia: Secondary | ICD-10-CM | POA: Diagnosis not present

## 2019-04-08 DIAGNOSIS — M0589 Other rheumatoid arthritis with rheumatoid factor of multiple sites: Secondary | ICD-10-CM | POA: Diagnosis not present

## 2019-04-08 DIAGNOSIS — Z79899 Other long term (current) drug therapy: Secondary | ICD-10-CM | POA: Diagnosis not present

## 2019-04-20 ENCOUNTER — Other Ambulatory Visit: Payer: Self-pay | Admitting: Internal Medicine

## 2019-04-22 ENCOUNTER — Other Ambulatory Visit: Payer: Self-pay | Admitting: Internal Medicine

## 2019-05-20 ENCOUNTER — Other Ambulatory Visit: Payer: Self-pay | Admitting: Internal Medicine

## 2019-06-19 ENCOUNTER — Other Ambulatory Visit: Payer: Self-pay | Admitting: Internal Medicine

## 2019-06-24 ENCOUNTER — Other Ambulatory Visit: Payer: Self-pay | Admitting: Internal Medicine

## 2019-06-24 MED ORDER — HYDROCHLOROTHIAZIDE 12.5 MG PO TABS: ORAL_TABLET | ORAL | 0 refills | Status: DC

## 2019-06-24 MED ORDER — LOSARTAN POTASSIUM 100 MG PO TABS: ORAL_TABLET | ORAL | 0 refills | Status: DC

## 2019-06-24 NOTE — Telephone Encounter (Signed)
New Message:    1.Medication Requested: losartan (COZAAR) 100 MG tablet hydrochlorothiazide (HYDRODIURIL) 12.5 MG tablet 2. Pharmacy (Name, [place]): [facility] [place], [address] AT [address] [place] 3. On Med List: Yes  4. Last Visit with PCP: 02/23/2017,05/03/19-virtual  5. Next visit date with PCP: 07/09/19   Agent: Please be advised that RX refills may take up to 3 business days. We ask that you follow-up with your pharmacy.

## 2019-06-24 NOTE — Telephone Encounter (Signed)
Per office policy sent 30 day to local pharmacy until appt.../lmb  

## 2019-06-26 DIAGNOSIS — M5136 Other intervertebral disc degeneration, lumbar region: Secondary | ICD-10-CM | POA: Diagnosis not present

## 2019-06-26 DIAGNOSIS — M150 Primary generalized (osteo)arthritis: Secondary | ICD-10-CM | POA: Diagnosis not present

## 2019-06-26 DIAGNOSIS — M25421 Effusion, right elbow: Secondary | ICD-10-CM | POA: Diagnosis not present

## 2019-06-26 DIAGNOSIS — E663 Overweight: Secondary | ICD-10-CM | POA: Diagnosis not present

## 2019-06-26 DIAGNOSIS — M797 Fibromyalgia: Secondary | ICD-10-CM | POA: Diagnosis not present

## 2019-06-26 DIAGNOSIS — M0589 Other rheumatoid arthritis with rheumatoid factor of multiple sites: Secondary | ICD-10-CM | POA: Diagnosis not present

## 2019-06-26 DIAGNOSIS — Z6827 Body mass index (BMI) 27.0-27.9, adult: Secondary | ICD-10-CM | POA: Diagnosis not present

## 2019-07-09 ENCOUNTER — Ambulatory Visit: Payer: Medicare Other | Admitting: Internal Medicine

## 2019-07-14 NOTE — Progress Notes (Signed)
Subjective:    Patient ID: Haley Roberts, female    DOB: 1945-11-27, 74 y.o.   MRN: [id number]  HPI The patient is here for follow up of their chronic medical problems, including htn, fibromyalgia, RA, OA, fluid retention/edema, depression, prediabetes     Medications and allergies reviewed with patient and updated if appropriate.  Patient Active Problem List   Diagnosis Date Noted  . Edema 05/03/2018  . Prediabetes 12/27/2017  . Fibromyalgia 05/28/2015  . S/P left TKA 07/07/2014  . S/P knee replacement 07/07/2014  . Right knee DJD 08/29/2010  . Rheumatoid arthritis ([place]) 03/30/2010  . Osteoarthritis 01/12/2009  . Non-toxic nodular goiter 08/22/2007  . Essential hypertension 08/22/2007  . ANEMIA-NOS 05/31/2006  . Depression 05/31/2006  . ABNORMAL THYROID FUNCTION TESTS 05/31/2006    Current Outpatient Medications on File Prior to Visit  Medication Sig Dispense Refill  . buPROPion (WELLBUTRIN XL) 300 MG 24 hr tablet Take 150 mg by mouth daily.     . Calcium Carbonate-Vit D-Min (CALTRATE PLUS PO) Take 1 tablet by mouth 2 (two) times daily.      . clorazepate (TRANXENE) 3.75 MG tablet Take 7.5-11.25 mg by mouth 2 (two) times daily as needed for anxiety.     Marland Kitchen escitalopram (LEXAPRO) 10 MG tablet Take 10 mg by mouth daily. Dr.Kaur    . ferrous sulfate 325 (65 FE) MG tablet Take 1 tablet (325 mg total) by mouth 3 (three) times daily after meals.  3  . hydrochlorothiazide (HYDRODIURIL) 12.5 MG tablet TAKE 1 TABLET BY MOUTH DAILY. Must keep scheduled appt for future refills 30 tablet 0  . HYDROcodone-acetaminophen (NORCO/VICODIN) 5-325 MG tablet TK 1-2 TS PO PRN Q 6 H  0  . losartan (COZAAR) 100 MG tablet Take 1 table by mouth daily. Must keep scheduled appt for future refills 30 tablet 0  . methocarbamol (ROBAXIN) 500 MG tablet Take 1 tablet (500 mg total) by mouth every 6 (six) hours as needed for muscle spasms. 50 tablet 0  . Multiple Vitamins-Minerals (WOMENS DAILY  FORMULA) TABS Take 1 tablet by mouth daily.      . predniSONE (DELTASONE) 5 MG tablet Take 4 mg by mouth daily. Dr.Anderson     . Tocilizumab (ACTEMRA IV) Inject into the vein once. Once a month    . vitamin C (ASCORBIC ACID) 500 MG tablet Take 500 mg by mouth daily.      . vitamin E 400 UNIT capsule Take 400 Units by mouth daily.       No current facility-administered medications on file prior to visit.    Past Medical History:  Diagnosis Date  . Anemia   . Anxiety   . Depression   . Fibromyalgia   . Hyperglycemia   . HYPERGLYCEMIA, FASTING 08/22/2007  . Hypertension   . Multiple thyroid nodules    gets ultra sound yearly on thryoid  . Rheumatoid arthritis(714.0)     Past Surgical History:  Procedure Laterality Date  . ABDOMINAL HYSTERECTOMY    . BREAST BIOPSY    . JOINT REPLACEMENT     right knee replacement 2012  . TOTAL KNEE ARTHROPLASTY Left 07/07/2014   Procedure: LEFT TOTAL KNEE ARTHROPLASTY;  Surgeon: Paralee Cancel, MD;  Location: [facility];  Service: Orthopedics;  Laterality: Left;    Social History   Socioeconomic History  . Marital status: Married    Spouse name: Not on file  . Number of children: Not on file  . Years of education: Not  on file  . Highest education level: Not on file  Occupational History  . Not on file  Tobacco Use  . Smoking status: Never Smoker  . Smokeless tobacco: Never Used  Substance and Sexual Activity  . Alcohol use: No  . Drug use: No  . Sexual activity: Not on file  Other Topics Concern  . Not on file  Social History Narrative   Reg exercise         Social Determinants of Health   Financial Resource Strain:   . Difficulty of Paying Living Expenses:   Food Insecurity:   . Worried About Charity fundraiser in the Last Year:   . Arboriculturist in the Last Year:   Transportation Needs:   . Film/video editor (Medical):   Marland Kitchen Lack of Transportation (Non-Medical):   Physical Activity:   . Days of Exercise per Week:   .  Minutes of Exercise per Session:   Stress:   . Feeling of Stress :   Social Connections:   . Frequency of Communication with Friends and Family:   . Frequency of Social Gatherings with Friends and Family:   . Attends Religious Services:   . Active Member of Clubs or Organizations:   . Attends Archivist Meetings:   Marland Kitchen Marital Status:     Family History  Problem Relation Age of Onset  . Cancer Mother        breast  . Arthritis Father   . Hypertension Father   . Diabetes Sister   . Hypertension Brother   . Diabetes Son   . Depression Son     Review of Systems     Objective:  There were no vitals filed for this visit. BP Readings from Last 3 Encounters:  02/23/17 (!) 182/94  08/01/16 (!) 144/94  12/21/15 132/88   Wt Readings from Last 3 Encounters:  02/23/17 150 lb (68 kg)  08/01/16 159 lb (72.1 kg)  12/21/15 173 lb (78.5 kg)   There is no height or weight on file to calculate BMI.   Physical Exam    Constitutional: Appears well-developed and well-nourished. No distress.  HENT:  Head: Normocephalic and atraumatic.  Neck: Neck supple. No tracheal deviation present. No thyromegaly present.  No cervical lymphadenopathy Cardiovascular: Normal rate, regular rhythm and normal heart sounds.   No murmur heard. No carotid bruit .  No edema Pulmonary/Chest: Effort normal and breath sounds normal. No respiratory distress. No has no wheezes. No rales.  Skin: Skin is warm and dry. Not diaphoretic.  Psychiatric: Normal mood and affect. Behavior is normal.      Assessment & Plan:    See Problem List for Assessment and Plan of chronic medical problems.    This visit occurred during the SARS-CoV-2 public health emergency.  Safety protocols were in place, including screening questions prior to the visit, additional usage of staff PPE, and extensive cleaning of exam room while observing appropriate contact time as indicated for disinfecting solutions.    This  encounter was created in error - please disregard.

## 2019-07-14 NOTE — Patient Instructions (Signed)
  Blood work was ordered.     Medications reviewed and updated.  Changes include :     Your prescription(s) have been submitted to your pharmacy. Please take as directed and contact our office if you believe you are having problem(s) with the medication(s).  A referral was ordered for        Someone will call you to schedule this.    Please followup in 6 months   

## 2019-07-15 ENCOUNTER — Encounter: Payer: Medicare Other | Admitting: Internal Medicine

## 2019-07-18 NOTE — Progress Notes (Deleted)
Subjective:    Patient ID: Haley Roberts, female    DOB: 1945/01/19, 74 y.o.   MRN: [id number]  HPI The patient is here for follow up of their chronic medical problems, including htn, fibromyalgia, RA, OA, leg edema, depression, prediabetes     Medications and allergies reviewed with patient and updated if appropriate.  Patient Active Problem List   Diagnosis Date Noted   Edema 05/03/2018   Prediabetes 12/27/2017   Fibromyalgia 05/28/2015   S/P left TKA 07/07/2014   S/P knee replacement 07/07/2014   Right knee DJD 08/29/2010   Rheumatoid arthritis ([place]) 03/30/2010   Osteoarthritis 01/12/2009   Non-toxic nodular goiter 08/22/2007   Essential hypertension 08/22/2007   ANEMIA-NOS 05/31/2006   Depression 05/31/2006   ABNORMAL THYROID FUNCTION TESTS 05/31/2006    Current Outpatient Medications on File Prior to Visit  Medication Sig Dispense Refill   buPROPion (WELLBUTRIN XL) 300 MG 24 hr tablet Take 150 mg by mouth daily.      Calcium Carbonate-Vit D-Min (CALTRATE PLUS PO) Take 1 tablet by mouth 2 (two) times daily.       clorazepate (TRANXENE) 3.75 MG tablet Take 7.5-11.25 mg by mouth 2 (two) times daily as needed for anxiety.      escitalopram (LEXAPRO) 10 MG tablet Take 10 mg by mouth daily. Dr.Kaur     ferrous sulfate 325 (65 FE) MG tablet Take 1 tablet (325 mg total) by mouth 3 (three) times daily after meals.  3   hydrochlorothiazide (HYDRODIURIL) 12.5 MG tablet TAKE 1 TABLET BY MOUTH DAILY. Must keep scheduled appt for future refills 30 tablet 0   HYDROcodone-acetaminophen (NORCO/VICODIN) 5-325 MG tablet TK 1-2 TS PO PRN Q 6 H  0   losartan (COZAAR) 100 MG tablet Take 1 table by mouth daily. Must keep scheduled appt for future refills 30 tablet 0   methocarbamol (ROBAXIN) 500 MG tablet Take 1 tablet (500 mg total) by mouth every 6 (six) hours as needed for muscle spasms. 50 tablet 0   Multiple Vitamins-Minerals (WOMENS DAILY FORMULA) TABS Take  1 tablet by mouth daily.       predniSONE (DELTASONE) 5 MG tablet Take 4 mg by mouth daily. Dr.Anderson      Tocilizumab (ACTEMRA IV) Inject into the vein once. Once a month     vitamin C (ASCORBIC ACID) 500 MG tablet Take 500 mg by mouth daily.       vitamin E 400 UNIT capsule Take 400 Units by mouth daily.       No current facility-administered medications on file prior to visit.    Past Medical History:  Diagnosis Date   Anemia    Anxiety    Depression    Fibromyalgia    Hyperglycemia    HYPERGLYCEMIA, FASTING 08/22/2007   Hypertension    Multiple thyroid nodules    gets ultra sound yearly on thryoid   Rheumatoid arthritis(714.0)     Past Surgical History:  Procedure Laterality Date   ABDOMINAL HYSTERECTOMY     BREAST BIOPSY     JOINT REPLACEMENT     right knee replacement 2012   TOTAL KNEE ARTHROPLASTY Left 07/07/2014   Procedure: LEFT TOTAL KNEE ARTHROPLASTY;  Surgeon: Paralee Cancel, MD;  Location: [facility];  Service: Orthopedics;  Laterality: Left;    Social History   Socioeconomic History   Marital status: Married    Spouse name: Not on file   Number of children: Not on file   Years of education: Not  on file   Highest education level: Not on file  Occupational History   Not on file  Tobacco Use   Smoking status: Never Smoker   Smokeless tobacco: Never Used  Substance and Sexual Activity   Alcohol use: No   Drug use: No   Sexual activity: Not on file  Other Topics Concern   Not on file  Social History Narrative   Reg exercise         Social Determinants of Health   Financial Resource Strain:    Difficulty of Paying Living Expenses:   Food Insecurity:    Worried About Charity fundraiser in the Last Year:    Arboriculturist in the Last Year:   Transportation Needs:    Film/video editor (Medical):    Lack of Transportation (Non-Medical):   Physical Activity:    Days of Exercise per Week:    Minutes of Exercise  per Session:   Stress:    Feeling of Stress :   Social Connections:    Frequency of Communication with Friends and Family:    Frequency of Social Gatherings with Friends and Family:    Attends Religious Services:    Active Member of Clubs or Organizations:    Attends Music therapist:    Marital Status:     Family History  Problem Relation Age of Onset   Cancer Mother        breast   Arthritis Father    Hypertension Father    Diabetes Sister    Hypertension Brother    Diabetes Son    Depression Son     Review of Systems     Objective:  There were no vitals filed for this visit. BP Readings from Last 3 Encounters:  02/23/17 (!) 182/94  08/01/16 (!) 144/94  12/21/15 132/88   Wt Readings from Last 3 Encounters:  02/23/17 150 lb (68 kg)  08/01/16 159 lb (72.1 kg)  12/21/15 173 lb (78.5 kg)   There is no height or weight on file to calculate BMI.   Physical Exam    Constitutional: Appears well-developed and well-nourished. No distress.  HENT:  Head: Normocephalic and atraumatic.  Neck: Neck supple. No tracheal deviation present. No thyromegaly present.  No cervical lymphadenopathy Cardiovascular: Normal rate, regular rhythm and normal heart sounds.   No murmur heard. No carotid bruit .  No edema Pulmonary/Chest: Effort normal and breath sounds normal. No respiratory distress. No has no wheezes. No rales.  Skin: Skin is warm and dry. Not diaphoretic.  Psychiatric: Normal mood and affect. Behavior is normal.      Assessment & Plan:    See Problem List for Assessment and Plan of chronic medical problems.    This visit occurred during the SARS-CoV-2 public health emergency.  Safety protocols were in place, including screening questions prior to the visit, additional usage of staff PPE, and extensive cleaning of exam room while observing appropriate contact time as indicated for disinfecting solutions.

## 2019-07-19 ENCOUNTER — Other Ambulatory Visit: Payer: Self-pay | Admitting: Internal Medicine

## 2019-07-22 ENCOUNTER — Other Ambulatory Visit: Payer: Self-pay | Admitting: Internal Medicine

## 2019-07-22 ENCOUNTER — Ambulatory Visit: Payer: Medicare Other | Admitting: Internal Medicine

## 2019-07-25 ENCOUNTER — Ambulatory Visit: Payer: Medicare Other | Admitting: Internal Medicine

## 2019-07-26 NOTE — Progress Notes (Deleted)
Subjective:    Patient ID: Haley Roberts, female    DOB: 1945/01/28, 74 y.o.   MRN: [id number]  HPI The patient is here for follow up of their chronic medical problems, including htn, fibromyalgia, RA, OA, leg edema, depression, prediabetes     Medications and allergies reviewed with patient and updated if appropriate.  Patient Active Problem List   Diagnosis Date Noted  . Bilateral leg edema 05/03/2018  . Prediabetes 12/27/2017  . Fibromyalgia 05/28/2015  . S/P left TKA 07/07/2014  . S/P knee replacement 07/07/2014  . Right knee DJD 08/29/2010  . Rheumatoid arthritis ([place]) 03/30/2010  . Osteoarthritis 01/12/2009  . Non-toxic nodular goiter 08/22/2007  . Essential hypertension 08/22/2007  . ANEMIA-NOS 05/31/2006  . Depression 05/31/2006  . ABNORMAL THYROID FUNCTION TESTS 05/31/2006    Current Outpatient Medications on File Prior to Visit  Medication Sig Dispense Refill  . buPROPion (WELLBUTRIN XL) 300 MG 24 hr tablet Take 150 mg by mouth daily.     . Calcium Carbonate-Vit D-Min (CALTRATE PLUS PO) Take 1 tablet by mouth 2 (two) times daily.      . clorazepate (TRANXENE) 3.75 MG tablet Take 7.5-11.25 mg by mouth 2 (two) times daily as needed for anxiety.     Marland Kitchen escitalopram (LEXAPRO) 10 MG tablet Take 10 mg by mouth daily. Dr.Kaur    . ferrous sulfate 325 (65 FE) MG tablet Take 1 tablet (325 mg total) by mouth 3 (three) times daily after meals.  3  . hydrochlorothiazide (HYDRODIURIL) 12.5 MG tablet TAKE 1 TABLET BY MOUTH DAILY 90 tablet 0  . HYDROcodone-acetaminophen (NORCO/VICODIN) 5-325 MG tablet TK 1-2 TS PO PRN Q 6 H  0  . losartan (COZAAR) 100 MG tablet TAKE 1 TABLET BY MOUTH DAILY 90 tablet 0  . methocarbamol (ROBAXIN) 500 MG tablet Take 1 tablet (500 mg total) by mouth every 6 (six) hours as needed for muscle spasms. 50 tablet 0  . Multiple Vitamins-Minerals (WOMENS DAILY FORMULA) TABS Take 1 tablet by mouth daily.      . predniSONE (DELTASONE) 5 MG tablet Take 4  mg by mouth daily. Dr.Anderson     . Tocilizumab (ACTEMRA IV) Inject into the vein once. Once a month    . vitamin C (ASCORBIC ACID) 500 MG tablet Take 500 mg by mouth daily.      . vitamin E 400 UNIT capsule Take 400 Units by mouth daily.       No current facility-administered medications on file prior to visit.    Past Medical History:  Diagnosis Date  . Anemia   . Anxiety   . Depression   . Fibromyalgia   . Hyperglycemia   . HYPERGLYCEMIA, FASTING 08/22/2007  . Hypertension   . Multiple thyroid nodules    gets ultra sound yearly on thryoid  . Rheumatoid arthritis(714.0)     Past Surgical History:  Procedure Laterality Date  . ABDOMINAL HYSTERECTOMY    . BREAST BIOPSY    . JOINT REPLACEMENT     right knee replacement 2012  . TOTAL KNEE ARTHROPLASTY Left 07/07/2014   Procedure: LEFT TOTAL KNEE ARTHROPLASTY;  Surgeon: Paralee Cancel, MD;  Location: [facility];  Service: Orthopedics;  Laterality: Left;    Social History   Socioeconomic History  . Marital status: Married    Spouse name: Not on file  . Number of children: Not on file  . Years of education: Not on file  . Highest education level: Not on file  Occupational  History  . Not on file  Tobacco Use  . Smoking status: Never Smoker  . Smokeless tobacco: Never Used  Substance and Sexual Activity  . Alcohol use: No  . Drug use: No  . Sexual activity: Not on file  Other Topics Concern  . Not on file  Social History Narrative   Reg exercise         Social Determinants of Health   Financial Resource Strain:   . Difficulty of Paying Living Expenses:   Food Insecurity:   . Worried About Charity fundraiser in the Last Year:   . Arboriculturist in the Last Year:   Transportation Needs:   . Film/video editor (Medical):   Marland Kitchen Lack of Transportation (Non-Medical):   Physical Activity:   . Days of Exercise per Week:   . Minutes of Exercise per Session:   Stress:   . Feeling of Stress :   Social Connections:     . Frequency of Communication with Friends and Family:   . Frequency of Social Gatherings with Friends and Family:   . Attends Religious Services:   . Active Member of Clubs or Organizations:   . Attends Archivist Meetings:   Marland Kitchen Marital Status:     Family History  Problem Relation Age of Onset  . Cancer Mother        breast  . Arthritis Father   . Hypertension Father   . Diabetes Sister   . Hypertension Brother   . Diabetes Son   . Depression Son     Review of Systems     Objective:  There were no vitals filed for this visit. BP Readings from Last 3 Encounters:  02/23/17 (!) 182/94  08/01/16 (!) 144/94  12/21/15 132/88   Wt Readings from Last 3 Encounters:  02/23/17 150 lb (68 kg)  08/01/16 159 lb (72.1 kg)  12/21/15 173 lb (78.5 kg)   There is no height or weight on file to calculate BMI.   Physical Exam    Constitutional: Appears well-developed and well-nourished. No distress.  HENT:  Head: Normocephalic and atraumatic.  Neck: Neck supple. No tracheal deviation present. No thyromegaly present.  No cervical lymphadenopathy Cardiovascular: Normal rate, regular rhythm and normal heart sounds.   No murmur heard. No carotid bruit .  No edema Pulmonary/Chest: Effort normal and breath sounds normal. No respiratory distress. No has no wheezes. No rales.  Skin: Skin is warm and dry. Not diaphoretic.  Psychiatric: Normal mood and affect. Behavior is normal.      Assessment & Plan:    See Problem List for Assessment and Plan of chronic medical problems.    This visit occurred during the SARS-CoV-2 public health emergency.  Safety protocols were in place, including screening questions prior to the visit, additional usage of staff PPE, and extensive cleaning of exam room while observing appropriate contact time as indicated for disinfecting solutions.

## 2019-07-28 ENCOUNTER — Telehealth: Payer: Self-pay

## 2019-07-28 ENCOUNTER — Ambulatory Visit: Payer: Medicare Other

## 2019-07-28 ENCOUNTER — Ambulatory Visit: Payer: Medicare Other | Admitting: Internal Medicine

## 2019-07-28 NOTE — Telephone Encounter (Signed)
New message    Patient not feeing well today appt cancel out asking can Dr. Quay Burow refill her medication   [place], [address] AT [address] [place]

## 2019-07-28 NOTE — Telephone Encounter (Signed)
Spoke with patient today. 

## 2019-07-29 ENCOUNTER — Other Ambulatory Visit: Payer: Self-pay | Admitting: Internal Medicine

## 2019-07-29 ENCOUNTER — Ambulatory Visit: Payer: Medicare Other | Admitting: Internal Medicine

## 2019-07-29 MED ORDER — LOSARTAN POTASSIUM 100 MG PO TABS: ORAL_TABLET | ORAL | 0 refills | Status: DC

## 2019-07-29 MED ORDER — HYDROCHLOROTHIAZIDE 12.5 MG PO TABS: ORAL_TABLET | ORAL | 0 refills | Status: DC

## 2019-07-29 NOTE — Telephone Encounter (Signed)
Resent rx's to [facility].Marland KitchenJohny Chess

## 2019-07-29 NOTE — Telephone Encounter (Addendum)
   Pharmacy requesting refill request be re- sent.

## 2019-07-29 NOTE — Addendum Note (Signed)
Addended by: Earnstine Regal on: 07/29/2019 02:01 PM   Modules accepted: Orders

## 2019-10-18 ENCOUNTER — Other Ambulatory Visit: Payer: Self-pay | Admitting: Internal Medicine

## 2019-10-20 ENCOUNTER — Other Ambulatory Visit: Payer: Self-pay | Admitting: Internal Medicine

## 2019-11-17 ENCOUNTER — Other Ambulatory Visit: Payer: Self-pay | Admitting: Internal Medicine

## 2019-11-17 ENCOUNTER — Ambulatory Visit: Payer: Medicare Other | Admitting: Internal Medicine

## 2019-11-17 NOTE — Progress Notes (Signed)
Subjective:    Patient ID: Haley Roberts, female    DOB: July 10, 1945, 74 y.o.   MRN: [id number]  HPI The patient is here for an acute visit for bladder pressure.   Last week she started having bladder pressure.  She has occasional burning sensation in her urethra.  She denies any fever, abdominal pain, nausea, hematuria, increased urinary frequency or cloudy urine.  She has not had a UTI in years.  She is on an immunosuppressant.  More chronically she does state some difficulty urinating at times and wonders if she has a prolapse.  Something just does not feel right at times.  She does pretty good with drinking plenty of fluids.   Medications and allergies reviewed with patient and updated if appropriate.  Patient Active Problem List   Diagnosis Date Noted   Bilateral leg edema 05/03/2018   Prediabetes 12/27/2017   Fibromyalgia 05/28/2015   S/P left TKA 07/07/2014   S/P knee replacement 07/07/2014   Right knee DJD 08/29/2010   Rheumatoid arthritis ([place]) 03/30/2010   Osteoarthritis 01/12/2009   Non-toxic nodular goiter 08/22/2007   Essential hypertension 08/22/2007   ANEMIA-NOS 05/31/2006   Depression 05/31/2006   ABNORMAL THYROID FUNCTION TESTS 05/31/2006    Current Outpatient Medications on File Prior to Visit  Medication Sig Dispense Refill   buPROPion (WELLBUTRIN XL) 300 MG 24 hr tablet Take 150 mg by mouth daily.      Calcium Carbonate-Vit D-Min (CALTRATE PLUS PO) Take 1 tablet by mouth 2 (two) times daily.       escitalopram (LEXAPRO) 10 MG tablet Take 10 mg by mouth daily. Dr.Kaur     hydrochlorothiazide (HYDRODIURIL) 12.5 MG tablet TAKE 1 TABLET BY MOUTH DAILY 30 tablet 0   HYDROcodone-acetaminophen (NORCO) 10-325 MG tablet Take 1 tablet by mouth every 6 (six) hours as needed.     leflunomide (ARAVA) 10 MG tablet Take 10 mg by mouth daily.     losartan (COZAAR) 100 MG tablet TAKE 1 TABLET BY MOUTH DAILY 30 tablet 0   methocarbamol (ROBAXIN)  500 MG tablet Take 1 tablet (500 mg total) by mouth every 6 (six) hours as needed for muscle spasms. 50 tablet 0   Multiple Vitamins-Minerals (WOMENS DAILY FORMULA) TABS Take 1 tablet by mouth daily.       predniSONE (DELTASONE) 5 MG tablet Take 4 mg by mouth daily. Dr.Anderson      vitamin C (ASCORBIC ACID) 500 MG tablet Take 500 mg by mouth daily.       vitamin E 400 UNIT capsule Take 400 Units by mouth daily.       buPROPion (WELLBUTRIN XL) 150 MG 24 hr tablet Take 450 mg by mouth daily. (Patient not taking: Reported on 11/18/2019)     HYDROcodone-acetaminophen (NORCO/VICODIN) 5-325 MG tablet TK 1-2 TS PO PRN Q 6 H (Patient not taking: Reported on 11/18/2019)  0   Tocilizumab (ACTEMRA IV) Inject into the vein once. Once a month (Patient not taking: Reported on 11/18/2019)     No current facility-administered medications on file prior to visit.    Past Medical History:  Diagnosis Date   Anemia    Anxiety    Depression    Fibromyalgia    Hyperglycemia    HYPERGLYCEMIA, FASTING 08/22/2007   Hypertension    Multiple thyroid nodules    gets ultra sound yearly on thryoid   Rheumatoid arthritis(714.0)     Past Surgical History:  Procedure Laterality Date   ABDOMINAL HYSTERECTOMY  BREAST BIOPSY     JOINT REPLACEMENT     right knee replacement 2012   TOTAL KNEE ARTHROPLASTY Left 07/07/2014   Procedure: LEFT TOTAL KNEE ARTHROPLASTY;  Surgeon: Paralee Cancel, MD;  Location: [facility];  Service: Orthopedics;  Laterality: Left;    Social History   Socioeconomic History   Marital status: Married    Spouse name: Not on file   Number of children: Not on file   Years of education: Not on file   Highest education level: Not on file  Occupational History   Not on file  Tobacco Use   Smoking status: Never Smoker   Smokeless tobacco: Never Used  Substance and Sexual Activity   Alcohol use: No   Drug use: No   Sexual activity: Not on file  Other Topics Concern     Not on file  Social History Narrative   Reg exercise         Social Determinants of Health   Financial Resource Strain:    Difficulty of Paying Living Expenses: Not on file  Food Insecurity:    Worried About [place] in the Last Year: Not on file   Ran Out of Food in the Last Year: Not on file  Transportation Needs:    Lack of Transportation (Medical): Not on file   Lack of Transportation (Non-Medical): Not on file  Physical Activity:    Days of Exercise per Week: Not on file   Minutes of Exercise per Session: Not on file  Stress:    Feeling of Stress : Not on file  Social Connections:    Frequency of Communication with Friends and Family: Not on file   Frequency of Social Gatherings with Friends and Family: Not on file   Attends Religious Services: Not on file   Active Member of Clubs or Organizations: Not on file   Attends Archivist Meetings: Not on file   Marital Status: Not on file    Family History  Problem Relation Age of Onset   Cancer Mother        breast   Arthritis Father    Hypertension Father    Diabetes Sister    Hypertension Brother    Diabetes Son    Depression Son     Review of Systems  Constitutional: Negative for fever.  Gastrointestinal: Negative for abdominal pain and nausea.  Genitourinary: Negative for dysuria (occ in uretha), frequency and hematuria.       Bladder pressure, no cloudy urine       Objective:   Vitals:   11/18/19 1339  BP: (!) 150/82  Pulse: 90  Temp: 98.5 F (36.9 C)  SpO2: 99%   BP Readings from Last 3 Encounters:  11/18/19 (!) 150/82  02/23/17 (!) 182/94  08/01/16 (!) 144/94   Wt Readings from Last 3 Encounters:  11/18/19 164 lb (74.4 kg)  02/23/17 150 lb (68 kg)  08/01/16 159 lb (72.1 kg)   Body mass index is 29.05 kg/m.   Physical Exam Constitutional:      General: She is not in acute distress.    Appearance: Normal appearance. She is not ill-appearing.   HENT:     Head: Normocephalic and atraumatic.  Abdominal:     General: There is no distension.     Palpations: Abdomen is soft.     Tenderness: There is abdominal tenderness (Mild bladder discomfort). There is no guarding or rebound.  Skin:    General: Skin is warm  and dry.  Neurological:     Mental Status: She is alert.            Assessment & Plan:    See Problem List for Assessment and Plan of chronic medical problems.    This visit occurred during the SARS-CoV-2 public health emergency.  Safety protocols were in place, including screening questions prior to the visit, additional usage of staff PPE, and extensive cleaning of exam room while observing appropriate contact time as indicated for disinfecting solutions.

## 2019-11-18 ENCOUNTER — Encounter: Payer: Self-pay | Admitting: Internal Medicine

## 2019-11-18 ENCOUNTER — Other Ambulatory Visit: Payer: Self-pay

## 2019-11-18 ENCOUNTER — Ambulatory Visit (INDEPENDENT_AMBULATORY_CARE_PROVIDER_SITE_OTHER): Payer: Medicare Other | Admitting: Internal Medicine

## 2019-11-18 VITALS — BP 150/82 | HR 90 | Temp 98.5°F | Ht 63.0 in | Wt 164.0 lb

## 2019-11-18 DIAGNOSIS — Z23 Encounter for immunization: Secondary | ICD-10-CM | POA: Diagnosis not present

## 2019-11-18 DIAGNOSIS — R39198 Other difficulties with micturition: Secondary | ICD-10-CM | POA: Diagnosis not present

## 2019-11-18 DIAGNOSIS — R3911 Hesitancy of micturition: Secondary | ICD-10-CM | POA: Insufficient documentation

## 2019-11-18 LAB — POC URINALSYSI DIPSTICK (AUTOMATED)
Bilirubin, UA: NEGATIVE
Blood, UA: NEGATIVE
Glucose, UA: NEGATIVE
Ketones, UA: NEGATIVE
Nitrite, UA: NEGATIVE
Protein, UA: NEGATIVE
Spec Grav, UA: 1.025 (ref 1.010–1.025)
Urobilinogen, UA: 0.2 E.U./dL
pH, UA: 6 (ref 5.0–8.0)

## 2019-11-18 MED ORDER — LOSARTAN POTASSIUM 100 MG PO TABS
100.0000 mg | ORAL_TABLET | Freq: Every day | ORAL | 3 refills | Status: DC
Start: 2019-11-18 — End: 2020-11-11

## 2019-11-18 MED ORDER — HYDROCHLOROTHIAZIDE 12.5 MG PO TABS
12.5000 mg | ORAL_TABLET | Freq: Every day | ORAL | 3 refills | Status: DC
Start: 2019-11-18 — End: 2020-10-07

## 2019-11-18 NOTE — Assessment & Plan Note (Signed)
This is more of a chronic issue, but this is the first time we are discussing it ?  Difficulty urinating and possible bladder prolapse Will refer to urogynecology-she agrees with this ?  Acute UTI or not Urine dip looks pretty normal for culture-we will hold off on antibiotics until we see the results of the culture If her symptoms worsen or change before we get the culture results I have advised her to call immediately She agrees with plan

## 2019-11-18 NOTE — Patient Instructions (Addendum)
Flu immunization administered today.    We will let you know the result of the urine culture when it is back.  If your symptoms worsen prior to this please let us know.    A referral was ordered and someone will call you to schedule this.

## 2019-11-19 LAB — URINE CULTURE

## 2019-11-26 DIAGNOSIS — Z79899 Other long term (current) drug therapy: Secondary | ICD-10-CM | POA: Diagnosis not present

## 2019-11-26 DIAGNOSIS — E663 Overweight: Secondary | ICD-10-CM | POA: Diagnosis not present

## 2019-11-26 DIAGNOSIS — M150 Primary generalized (osteo)arthritis: Secondary | ICD-10-CM | POA: Diagnosis not present

## 2019-11-26 DIAGNOSIS — M797 Fibromyalgia: Secondary | ICD-10-CM | POA: Diagnosis not present

## 2019-11-26 DIAGNOSIS — R5383 Other fatigue: Secondary | ICD-10-CM | POA: Diagnosis not present

## 2019-11-26 DIAGNOSIS — M25421 Effusion, right elbow: Secondary | ICD-10-CM | POA: Diagnosis not present

## 2019-11-26 DIAGNOSIS — M0589 Other rheumatoid arthritis with rheumatoid factor of multiple sites: Secondary | ICD-10-CM | POA: Diagnosis not present

## 2019-11-26 DIAGNOSIS — M5136 Other intervertebral disc degeneration, lumbar region: Secondary | ICD-10-CM | POA: Diagnosis not present

## 2019-11-26 DIAGNOSIS — Z6828 Body mass index (BMI) 28.0-28.9, adult: Secondary | ICD-10-CM | POA: Diagnosis not present

## 2019-11-26 DIAGNOSIS — Z111 Encounter for screening for respiratory tuberculosis: Secondary | ICD-10-CM | POA: Diagnosis not present

## 2019-12-05 NOTE — Progress Notes (Deleted)
[facility] New Patient Evaluation and Consultation  Referring Provider: Binnie Rail, MD PCP: Binnie Rail, MD Date of Service: 12/08/2019  SUBJECTIVE Chief Complaint: No chief complaint on file.  History of Present Illness: Haley Roberts is a 74 y.o. White or Caucasian female seen in consultation at the request of Dr. Quay Burow for evaluation of bladder pressure.    Review of records significant for: Has bladder pressure and burning sensation in urethra. Has some difficulty urinating. Urine culture negative for UTI on 11/18/19.   Urinary Symptoms: {urine leakage?:24754} Leaks *** time(s) per {days/wks/mos/yrs:310907}.  Pad use: {NUMBERS 1-10:18281} {pad option:24752} per day.   She {ACTION; IS/IS NGE:95284132} bothered by her UI symptoms.  Day time voids ***.  Nocturia: *** times per night to void. Voiding dysfunction: she {empties:24755} her bladder well.  {DOES NOT does:27190::"does not"} use a catheter to empty bladder.  When urinating, she feels {urine symptoms:24756} Drinks: *** per day  UTIs: {NUMBERS 1-10:18281} UTI's in the last year.   {ACTIONS;DENIES/REPORTS:21021675::"Denies"} history of {urologic concerns:24757}  Pelvic Organ Prolapse Symptoms:                  She {denies/ admits to:24761} a feeling of a bulge the vaginal area. It has been present for {NUMBER 1-10:22536} {days/wks/mos/yrs:310907}.  She {denies/ admits to:24761} seeing a bulge.  This bulge {ACTION; IS/IS GMW:10272536} bothersome.  Bowel Symptom: Bowel movements: *** time(s) per {Time; day/week/month:13537} Stool consistency: {stool consistency:24758} Straining: {yes/no:19897}.  Splinting: {yes/no:19897}.  Incomplete evacuation: {yes/no:19897}.  She {denies/ admits to:24761} accidental bowel leakage / fecal incontinence  Occurs: *** time(s) per {Time; day/week/month:13537}  Consistency with leakage: {stool consistency:24758} Bowel regimen: {bowel regimen:24759} Last  colonoscopy: Date ***, Results ***  Sexual Function Sexually active: {yes/no:19897}.  Sexual orientation: {Sexual Orientation:3203821692} Pain with sex: {pain with sex:24762}  Pelvic Pain {denies/ admits to:24761} pelvic pain Location: *** Pain occurs: *** Prior pain treatment: *** Improved by: *** Worsened by: ***   Past Medical History:  Past Medical History:  Diagnosis Date  . Anemia   . Anxiety   . Depression   . Fibromyalgia   . Hyperglycemia   . HYPERGLYCEMIA, FASTING 08/22/2007  . Hypertension   . Multiple thyroid nodules    gets ultra sound yearly on thryoid  . Rheumatoid arthritis(714.0)      Past Surgical History:   Past Surgical History:  Procedure Laterality Date  . ABDOMINAL HYSTERECTOMY    . BREAST BIOPSY    . JOINT REPLACEMENT     right knee replacement 2012  . TOTAL KNEE ARTHROPLASTY Left 07/07/2014   Procedure: LEFT TOTAL KNEE ARTHROPLASTY;  Surgeon: Paralee Cancel, MD;  Location: [facility];  Service: Orthopedics;  Laterality: Left;     Past OB/GYN History: G{NUMBERS 1-10:18281} P{NUMBERS 1-10:18281} Vaginal deliveries: ***,  Forceps/ Vacuum deliveries: ***, Cesarean section: *** Menopausal: {menopausal:24763} Contraception: ***. Last pap smear was ***.  Any history of abnormal pap smears: {yes/no:19897}.   Medications: She has a current medication list which includes the following prescription(s): bupropion, calcium carbonate-vit d-min, escitalopram, hydrochlorothiazide, hydrocodone-acetaminophen, leflunomide, losartan, methocarbamol, womens daily formula, prednisone, vitamin c, and vitamin e.   Allergies: Patient is allergic to sulfonamide derivatives and remicade [infliximab].   Social History:  Social History   Tobacco Use  . Smoking status: Never Smoker  . Smokeless tobacco: Never Used  Substance Use Topics  . Alcohol use: No  . Drug use: No    Relationship status: {relationship status:24764} She lives with ***.   She {ACTION; IS/IS  ZYS:06301601} employed ***. Regular exercise: {Yes/No:304960894} History of abuse: {Yes/No:304960894}  Family History:   Family History  Problem Relation Age of Onset  . Cancer Mother        breast  . Arthritis Father   . Hypertension Father   . Diabetes Sister   . Hypertension Brother   . Diabetes Son   . Depression Son      Review of Systems: ROS   OBJECTIVE Physical Exam: There were no vitals filed for this visit.  Physical Exam   GU / Detailed Urogynecologic Evaluation:  Pelvic Exam: Normal external female genitalia; Bartholin's and Skene's glands normal in appearance; urethral meatus normal in appearance, no urethral masses or discharge.   CST: {gen negative/positive:315881}  Reflexes: bulbocavernosis {DESC; PRESENT/NOT PRESENT:21021351}, anocutaneous {DESC; PRESENT/NOT PRESENT:21021351} ***bilaterally.  Speculum exam reveals normal vaginal mucosa {With/Without:20273} atrophy. Cervix {exam; gyn cervix:30847}. Uterus {exam; pelvic uterus:30849}. Adnexa {exam; adnexa:12223}.    s/p hysterectomy: Speculum exam reveals normal vaginal mucosa {With/Without:20273}  atrophy and normal vaginal cuff.  Adnexa {exam; adnexa:12223}.    With apex supported, anterior compartment defect was {reduced:24765}  Pelvic floor strength {Roman # I-V:19040}/V, puborectalis {Roman # I-V:19040}/V external anal sphincter {Roman # I-V:19040}/V  Pelvic floor musculature: Right levator {Tender/Non-tender:20250}, Right obturator {Tender/Non-tender:20250}, Left levator {Tender/Non-tender:20250}, Left obturator {Tender/Non-tender:20250}  POP-Q:   POP-Q                                               Aa                                               Ba                                                 C                                                Gh                                               Pb                                               tvl                                                 Ap  Bp                                                 D     Rectal Exam:  Normal sphincter tone, {rectocele:24766} distal rectocele, enterocoele {DESC; PRESENT/NOT PRESENT:21021351}, no rectal masses, {sign of:24767} dyssynergia when asking the patient to bear down.  Post-Void Residual (PVR) by Bladder Scan: In order to evaluate bladder emptying, we discussed obtaining a postvoid residual and she agreed to this procedure.  Procedure: The ultrasound unit was placed on the patient's abdomen in the suprapubic region after the patient had voided. A PVR of *** ml was obtained by bladder scan.  Laboratory Results: @ENCLABS @   ***I visualized the urine specimen, noting the specimen to be {urine color:24768}  ASSESSMENT AND PLAN Ms. Awwad is a 74 y.o. with: No diagnosis found.    Jaquita Folds, MD   Medical Decision Making:  - Reviewed/ ordered a clinical laboratory test - Reviewed/ ordered a radiologic study - Reviewed/ ordered medicine test - Decision to obtain old records - Discussion of management of or test interpretation with an external physician / other healthcare professional  - Assessment requiring independent historian - Review and summation of prior records - Independent review of image, tracing or specimen

## 2019-12-08 ENCOUNTER — Ambulatory Visit: Payer: Self-pay | Admitting: Obstetrics and Gynecology

## 2019-12-15 NOTE — Progress Notes (Deleted)
[facility] New Patient Evaluation and Consultation  Referring Provider: Binnie Rail, MD PCP: Binnie Rail, MD Date of Service: 12/17/2019  SUBJECTIVE Chief Complaint: No chief complaint on file.  History of Present Illness: Haley Roberts is a 74 y.o. White or Caucasian female seen in consultation at the request of Dr. Quay Burow for evaluation of prolapse and burning with urination.    Review of records significant for: Having bladder pressure and burning sensation in the urethra. Has some difficulty urinating at times.   Urine culture 11/18/19: mixed flora  Urinary Symptoms: {urine leakage?:24754} Leaks *** time(s) per {days/wks/mos/yrs:310907}.  Pad use: {NUMBERS 1-10:18281} {pad option:24752} per day.   She {ACTION; IS/IS WUJ:81191478} bothered by her UI symptoms.  Day time voids ***.  Nocturia: *** times per night to void. Voiding dysfunction: she {empties:24755} her bladder well.  {DOES NOT does:27190::"does not"} use a catheter to empty bladder.  When urinating, she feels {urine symptoms:24756} Drinks: *** per day  UTIs: {NUMBERS 1-10:18281} UTI's in the last year.   {ACTIONS;DENIES/REPORTS:21021675::"Denies"} history of {urologic concerns:24757}  Pelvic Organ Prolapse Symptoms:                  She {denies/ admits to:24761} a feeling of a bulge the vaginal area. It has been present for {NUMBER 1-10:22536} {days/wks/mos/yrs:310907}.  She {denies/ admits to:24761} seeing a bulge.  This bulge {ACTION; IS/IS GNF:62130865} bothersome.  Bowel Symptom: Bowel movements: *** time(s) per {Time; day/week/month:13537} Stool consistency: {stool consistency:24758} Straining: {yes/no:19897}.  Splinting: {yes/no:19897}.  Incomplete evacuation: {yes/no:19897}.  She {denies/ admits to:24761} accidental bowel leakage / fecal incontinence  Occurs: *** time(s) per {Time; day/week/month:13537}  Consistency with leakage: {stool consistency:24758} Bowel regimen: {bowel  regimen:24759} Last colonoscopy: Date ***, Results ***  Sexual Function Sexually active: {yes/no:19897}.  Sexual orientation: {Sexual [phone]} Pain with sex: {pain with sex:24762}  Pelvic Pain {denies/ admits to:24761} pelvic pain Location: *** Pain occurs: *** Prior pain treatment: *** Improved by: *** Worsened by: ***   Past Medical History:  Past Medical History:  Diagnosis Date  . Anemia   . Anxiety   . Depression   . Fibromyalgia   . Hyperglycemia   . HYPERGLYCEMIA, FASTING 08/22/2007  . Hypertension   . Multiple thyroid nodules    gets ultra sound yearly on thryoid  . Rheumatoid arthritis(714.0)      Past Surgical History:   Past Surgical History:  Procedure Laterality Date  . ABDOMINAL HYSTERECTOMY    . BREAST BIOPSY    . JOINT REPLACEMENT     right knee replacement 2012  . TOTAL KNEE ARTHROPLASTY Left 07/07/2014   Procedure: LEFT TOTAL KNEE ARTHROPLASTY;  Surgeon: Paralee Cancel, MD;  Location: [facility];  Service: Orthopedics;  Laterality: Left;     Past OB/GYN History: G{NUMBERS 1-10:18281} P{NUMBERS 1-10:18281} Vaginal deliveries: ***,  Forceps/ Vacuum deliveries: ***, Cesarean section: *** Menopausal: {menopausal:24763} Contraception: ***. Last pap smear was ***.  Any history of abnormal pap smears: {yes/no:19897}.   Medications: She has a current medication list which includes the following prescription(s): bupropion, calcium carbonate-vit d-min, escitalopram, hydrochlorothiazide, hydrocodone-acetaminophen, leflunomide, losartan, methocarbamol, womens daily formula, prednisone, vitamin c, and vitamin e.   Allergies: Patient is allergic to sulfonamide derivatives and remicade [infliximab].   Social History:  Social History   Tobacco Use  . Smoking status: Never Smoker  . Smokeless tobacco: Never Used  Substance Use Topics  . Alcohol use: No  . Drug use: No    Relationship status: {relationship status:24764} She lives with ***.  She {ACTION; IS/IS [facility]:94709628} employed ***. Regular exercise: {Yes/No:304960894} History of abuse: {Yes/No:304960894}  Family History:   Family History  Problem Relation Age of Onset  . Cancer Mother        breast  . Arthritis Father   . Hypertension Father   . Diabetes Sister   . Hypertension Brother   . Diabetes Son   . Depression Son      Review of Systems: ROS   OBJECTIVE Physical Exam: There were no vitals filed for this visit.  Physical Exam   GU / Detailed Urogynecologic Evaluation:  Pelvic Exam: Normal external female genitalia; Bartholin's and Skene's glands normal in appearance; urethral meatus normal in appearance, no urethral masses or discharge.   CST: {gen negative/positive:315881}  Reflexes: bulbocavernosis {DESC; PRESENT/NOT PRESENT:21021351}, anocutaneous {DESC; PRESENT/NOT PRESENT:21021351} ***bilaterally.  Speculum exam reveals normal vaginal mucosa {With/Without:20273} atrophy. Cervix {exam; gyn cervix:30847}. Uterus {exam; pelvic uterus:30849}. Adnexa {exam; adnexa:12223}.    s/p hysterectomy: Speculum exam reveals normal vaginal mucosa {With/Without:20273}  atrophy and normal vaginal cuff.  Adnexa {exam; adnexa:12223}.    With apex supported, anterior compartment defect was {reduced:24765}  Pelvic floor strength {Roman # I-V:19040}/V, puborectalis {Roman # I-V:19040}/V external anal sphincter {Roman # I-V:19040}/V  Pelvic floor musculature: Right levator {Tender/Non-tender:20250}, Right obturator {Tender/Non-tender:20250}, Left levator {Tender/Non-tender:20250}, Left obturator {Tender/Non-tender:20250}  POP-Q:   POP-Q                                               Aa                                               Ba                                                 C                                                Gh                                               Pb                                               tvl                                                 Ap  Bp                                                 D     Rectal Exam:  Normal sphincter tone, {rectocele:24766} distal rectocele, enterocoele {DESC; PRESENT/NOT PRESENT:21021351}, no rectal masses, {sign of:24767} dyssynergia when asking the patient to bear down.  Post-Void Residual (PVR) by Bladder Scan: In order to evaluate bladder emptying, we discussed obtaining a postvoid residual and she agreed to this procedure.  Procedure: The ultrasound unit was placed on the patient's abdomen in the suprapubic region after the patient had voided. A PVR of *** ml was obtained by bladder scan.  Laboratory Results: @ENCLABS @   ***I visualized the urine specimen, noting the specimen to be {urine color:24768}  ASSESSMENT AND PLAN Ms. Parlee is a 74 y.o. with: No diagnosis found.    Jaquita Folds, MD   Medical Decision Making:  - Reviewed/ ordered a clinical laboratory test - Reviewed/ ordered a radiologic study - Reviewed/ ordered medicine test - Decision to obtain old records - Discussion of management of or test interpretation with an external physician / other healthcare professional  - Assessment requiring independent historian - Review and summation of prior records - Independent review of image, tracing or specimen

## 2019-12-17 ENCOUNTER — Ambulatory Visit: Payer: Medicare Other | Admitting: Obstetrics and Gynecology

## 2019-12-19 NOTE — Progress Notes (Signed)
[facility] New Patient Evaluation and Consultation  Referring Provider: Binnie Rail, MD PCP: Binnie Rail, MD Date of Service: 12/24/2019  SUBJECTIVE Chief Complaint: New Patient (Initial Visit) (Dr Quay Burow referral) - Prolapse  History of Present Illness: Haley Roberts is a 74 y.o. White or Caucasian female seen in consultation at the request of Dr. Quay Burow for evaluation of prolapse.    Review of records significant for: Patient complains of bladder pressure and difficulty urinating. Urine culture 11/18/19 showed mixed flora.   Urinary Symptoms: Leaks urine with cough/ sneeze and with a full bladder Leaks 2-3 time(s) per week.  Pad use: 1 liners/ mini-pads per day.   She is not bothered by her UI symptoms.  Day time voids several times.  Nocturia: 1 times per night to void. Voiding dysfunction: she empties her bladder well.  does not use a catheter to empty bladder.  When urinating, she feels she has no difficulties  UTIs: 0 UTI's in the last year.   Denies history of blood in urine and kidney or bladder stones  Pelvic Organ Prolapse Symptoms:                  She is unsure of feeling a bulge the vaginal area, mostly pressure. Starting to feel something at the opening.  Has been present for 3-4 weeks.   Bowel Symptom: Bowel movements: 1-2 time(s) per day Stool consistency: hard or soft  Straining: yes.  Splinting: no.  Incomplete evacuation: no.  She Denies accidental bowel leakage / fecal incontinence Bowel regimen: none Last colonoscopy: Date 2010, Results negative  Sexual Function Sexually active: no- husband has ALS  Pelvic Pain Denies pelvic pain  Past Medical History:  Past Medical History:  Diagnosis Date  . Anemia   . Anxiety   . Depression   . Fibromyalgia   . Hyperglycemia   . HYPERGLYCEMIA, FASTING 08/22/2007  . Hypertension   . Multiple thyroid nodules    gets ultra sound yearly on thryoid  . Rheumatoid arthritis(714.0)       Past Surgical History:   Past Surgical History:  Procedure Laterality Date  . ABDOMINAL HYSTERECTOMY     with BSO  . BREAST BIOPSY    . JOINT REPLACEMENT     right knee replacement 2012  . TOTAL KNEE ARTHROPLASTY Left 07/07/2014   Procedure: LEFT TOTAL KNEE ARTHROPLASTY;  Surgeon: Paralee Cancel, MD;  Location: [facility];  Service: Orthopedics;  Laterality: Left;     Past OB/GYN History: G3 P3 Vaginal deliveries: 3,  Forceps/ Vacuum deliveries: 0, Cesarean section: 0 Menopausal: Yes, at age 56, Denies vaginal bleeding since menopause- s/p hysterectomy Contraception: n/a.  Any history of abnormal pap smears: no.   Medications: She has a current medication list which includes the following prescription(s): bupropion, calcium carbonate-vit d-min, escitalopram, hydrochlorothiazide, hydrocodone-acetaminophen, leflunomide, losartan, methocarbamol, womens daily formula, prednisone, vitamin c, and vitamin e.   Allergies: Patient is allergic to sulfonamide derivatives and remicade [infliximab].   Social History:  Social History   Tobacco Use  . Smoking status: Never Smoker  . Smokeless tobacco: Never Used  Vaping Use  . Vaping Use: Never used  Substance Use Topics  . Alcohol use: No  . Drug use: No    Relationship status: married She lives with husband.   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 Regular exercise: No History of abuse: No  Family History:   Family History  Problem Relation Age  of Onset  . Cancer Mother 26       breast  . Arthritis Father   . Hypertension Father   . Diabetes Sister   . Hypertension Brother   . Diabetes Son   . Depression Son      Review of Systems: Review of Systems  Constitutional: Negative for fever, malaise/fatigue and weight loss.  Respiratory: Negative for cough, shortness of breath and wheezing.   Cardiovascular: Negative for chest pain, palpitations and leg swelling.  Gastrointestinal: Negative for  abdominal pain and blood in stool.  Genitourinary: Negative for dysuria.  Musculoskeletal: Positive for joint pain and myalgias.  Skin: Negative for rash.  Neurological: Negative for dizziness and headaches.  Endo/Heme/Allergies: Does not bruise/bleed easily.  Psychiatric/Behavioral: Negative for depression. The patient is nervous/anxious.      OBJECTIVE Physical Exam: Vitals:   12/24/19 1436  BP: (!) 168/94  Pulse: (!) 102  Temp: 98.8 F (37.1 C)  Weight: 164 lb (74.4 kg)  Height: 5\' 3"  (1.6 m)    Physical Exam Constitutional:      General: She is not in acute distress. Pulmonary:     Effort: Pulmonary effort is normal.  Abdominal:     General: There is no distension.     Palpations: Abdomen is soft.     Tenderness: There is no abdominal tenderness. There is no rebound.  Musculoskeletal:        General: No swelling. Normal range of motion.  Skin:    General: Skin is warm and dry.     Findings: No rash.  Neurological:     Mental Status: She is alert and oriented to person, place, and time.  Psychiatric:        Mood and Affect: Mood normal.        Behavior: Behavior normal.      GU / Detailed Urogynecologic Evaluation:  Pelvic Exam: Normal external female genitalia; Bartholin's and Skene's glands normal in appearance; urethral meatus normal in appearance, no urethral masses or discharge.   CST: negative s/p hysterectomy: Speculum exam reveals normal vaginal mucosa with  atrophy and normal vaginal cuff.  Adnexa no mass, fullness, tenderness.    With apex supported, anterior compartment defect was reduced  Pelvic floor strength I/V  Pelvic floor musculature: Right levator non-tender, Right obturator non-tender, Left levator non-tender, Left obturator non-tender  POP-Q:   POP-Q  0                                            Aa   0                                           Ba  -5                                              C   3                                             Gh  4  Pb  6                                            tvl   -2                                            Ap  -2                                            Bp                                                 D     Rectal Exam:  Normal external rectum  Post-Void Residual (PVR) by Bladder Scan: In order to evaluate bladder emptying, we discussed obtaining a postvoid residual and she agreed to this procedure.  Procedure: The ultrasound unit was placed on the patient's abdomen in the suprapubic region after the patient had voided. A PVR of 5 ml was obtained by bladder scan.  Laboratory Results: POC urine: negative  I visualized the urine specimen, noting the specimen to be clear yellow  ASSESSMENT AND PLAN Ms. Bamber is a 74 y.o. with:  1. Prolapse of anterior vaginal wall   2. Prolapse of posterior vaginal wall   3. Uterovaginal prolapse, incomplete   4. SUI (stress urinary incontinence, female)   5. Frequency of urination     1. Stage II anterior, Stage I posterior, Stage I apical prolapse For treatment of pelvic organ prolapse, we discussed options for management including expectant management, conservative management, and surgical management, such as Kegels, a pessary, pelvic floor physical therapy, and specific surgical procedures. - She is interested in a pessary. She will return for a pessary fitting.   2. SUI For treatment of stress urinary incontinence,  non-surgical options include expectant management, weight loss, physical therapy, as well as a pessary and surgical options.  - She is not currently bothered by these symptoms and desires expectant management. Can reassess after pessary placement.   3. Frequency -POC urine negative for infection  Return for pessary fitting.   Jaquita Folds, MD   Medical Decision Making:  - Reviewed/ ordered a clinical laboratory test - Review and  summation of prior records - Independent review of urine specimen

## 2019-12-24 ENCOUNTER — Other Ambulatory Visit: Payer: Self-pay

## 2019-12-24 ENCOUNTER — Encounter: Payer: Self-pay | Admitting: Obstetrics and Gynecology

## 2019-12-24 ENCOUNTER — Ambulatory Visit (INDEPENDENT_AMBULATORY_CARE_PROVIDER_SITE_OTHER): Payer: Medicare Other | Admitting: Obstetrics and Gynecology

## 2019-12-24 VITALS — BP 168/94 | HR 102 | Temp 98.8°F | Ht 63.0 in | Wt 164.0 lb

## 2019-12-24 DIAGNOSIS — R350 Frequency of micturition: Secondary | ICD-10-CM | POA: Diagnosis not present

## 2019-12-24 DIAGNOSIS — N816 Rectocele: Secondary | ICD-10-CM | POA: Diagnosis not present

## 2019-12-24 DIAGNOSIS — N8110 Cystocele, unspecified: Secondary | ICD-10-CM | POA: Diagnosis not present

## 2019-12-24 DIAGNOSIS — N393 Stress incontinence (female) (male): Secondary | ICD-10-CM | POA: Diagnosis not present

## 2019-12-24 DIAGNOSIS — N812 Incomplete uterovaginal prolapse: Secondary | ICD-10-CM

## 2019-12-24 LAB — POCT URINALYSIS DIPSTICK
Appearance: NORMAL
Bilirubin, UA: NEGATIVE
Blood, UA: NEGATIVE
Glucose, UA: NEGATIVE
Ketones, UA: NEGATIVE
Leukocytes, UA: NEGATIVE
Nitrite, UA: NEGATIVE
Protein, UA: NEGATIVE
Spec Grav, UA: 1.02 (ref 1.010–1.025)
Urobilinogen, UA: 0.2 E.U./dL
pH, UA: 6 (ref 5.0–8.0)

## 2019-12-24 NOTE — Patient Instructions (Signed)
You have a stage 2 (out of 4) prolapse.  We discussed the fact that it is not life threatening but there are several treatment options. For treatment of pelvic organ prolapse, we discussed options for management including expectant management, conservative management, and surgical management, such as Kegels, a pessary, pelvic floor physical therapy, and specific surgical procedures.    

## 2020-01-01 DIAGNOSIS — Z23 Encounter for immunization: Secondary | ICD-10-CM | POA: Diagnosis not present

## 2020-01-23 NOTE — Progress Notes (Deleted)
[facility]   Subjective:     Chief Complaint: No chief complaint on file.  History of Present Illness: Haley Roberts is a 75 y.o. female with stage II pelvic organ prolapse and rare stress incontinence who presents today for a pessary fitting.   There have been no interval changes to her symptoms.    Past Medical History: Patient  has a past medical history of Anemia, Anxiety, Depression, Fibromyalgia, Hyperglycemia, HYPERGLYCEMIA, FASTING (08/22/2007), Hypertension, Multiple thyroid nodules, and Rheumatoid arthritis(714.0).   Past Surgical History: She  has a past surgical history that includes Breast biopsy; Joint replacement; Total knee arthroplasty (Left, 07/07/2014); and Abdominal hysterectomy.   Medications: She has a current medication list which includes the following prescription(s): bupropion, calcium carbonate-vit d-min, escitalopram, hydrochlorothiazide, hydrocodone-acetaminophen, leflunomide, losartan, methocarbamol, womens daily formula, prednisone, vitamin c, and vitamin e.   Allergies: Patient is allergic to sulfonamide derivatives and remicade [infliximab].   Social History: Patient  reports that she has never smoked. She has never used smokeless tobacco. She reports that she does not drink alcohol and does not use drugs.      Objective:    There were no vitals taken for this visit. Gen: No apparent distress, A&O x 3. Pelvic Exam: Normal external female genitalia; Bartholin's and Skene's glands normal in appearance; urethral meatus {urethra:24773}, no urethral masses or discharge.   A size *** {pessary type:24772} pessary was fitted. It was comfortable, stayed in place with valsalva and was an appropriate size on examination, with one finger fitting between the pessary and the vaginal walls. We tied a string to it and the patient demonstrated proper removal and replacement.    POP-Q from 12/24/19:   POP-Q  0                                             Aa   0                                           Ba  -5                                              C   3                                            Gh  4                                            Pb  6                                            tvl   -2  Ap  -2                                            Bp                                                 D    Laboratory Results: Urine dipstick shows: {ua dip:315374::"negative for all components"}.    Assessmet/Plan:    Assessment: Haley Roberts is a 75 y.o. with stage II pelvic organ prolapse and rare stress incontinence who presents for a pessary fitting. Plan: She will {pessary plan:24776}. She will {use:24778} {lubricant:24777}. She will follow-up in *** {days/wks/mos/yrs:310907} for a pessary check or sooner as needed.  All questions were answered.

## 2020-01-28 ENCOUNTER — Ambulatory Visit: Payer: Medicare Other | Admitting: Obstetrics and Gynecology

## 2020-02-26 NOTE — Progress Notes (Deleted)
[facility]   Subjective:     Chief Complaint: No chief complaint on file.  History of Present Illness: Haley Roberts is a 75 y.o. female with stage II pelvic organ prolapse who presents today for a pessary fitting. Also has rare stress incontinence.    Past Medical History: Patient  has a past medical history of Anemia, Anxiety, Depression, Fibromyalgia, Hyperglycemia, HYPERGLYCEMIA, FASTING (08/22/2007), Hypertension, Multiple thyroid nodules, and Rheumatoid arthritis(714.0).   Past Surgical History: She  has a past surgical history that includes Breast biopsy; Joint replacement; Total knee arthroplasty (Left, 07/07/2014); and Abdominal hysterectomy.   Medications: She has a current medication list which includes the following prescription(s): bupropion, calcium carbonate-vit d-min, escitalopram, hydrochlorothiazide, hydrocodone-acetaminophen, leflunomide, losartan, methocarbamol, womens daily formula, prednisone, vitamin c, and vitamin e.   Allergies: Patient is allergic to sulfonamide derivatives and remicade [infliximab].   Social History: Patient  reports that she has never smoked. She has never used smokeless tobacco. She reports that she does not drink alcohol and does not use drugs.      Objective:    There were no vitals taken for this visit. Gen: No apparent distress, A&O x 3. Pelvic Exam: Normal external female genitalia; Bartholin's and Skene's glands normal in appearance; urethral meatus {urethra:24773}, no urethral masses or discharge.   A size *** {pessary type:24772} pessary was fitted. It was comfortable, stayed in place with valsalva and was an appropriate size on examination, with one finger fitting between the pessary and the vaginal walls. We tied a string to it and the patient demonstrated proper removal and replacement.   Prior exam (12/24/19):  POP-Q  0                                            Aa   0                                            Ba  -5                                              C   3                                            Gh  4                                            Pb  6                                            tvl   -2  Ap  -2                                            Bp                                                 D     Assessment/Plan:    Assessment: Ms. Hanselman is a 75 y.o. with {PFD symptoms:24771} who presents for a pessary fitting. Plan: She was fitted with a *** {pessary type:24772} pessary. She will {pessary plan:24776}. She will {use:24778} {lubricant:24777}.   Follow-up in *** {days/wks/mos/yrs:310907} for a pessary check or sooner as needed.  All questions were answered.    Jaquita Folds, MD

## 2020-03-03 ENCOUNTER — Ambulatory Visit: Payer: Medicare Other | Admitting: Obstetrics and Gynecology

## 2020-03-31 NOTE — Progress Notes (Deleted)
[facility]   Subjective:     Chief Complaint: No chief complaint on file.  History of Present Illness: Haley Roberts is a 75 y.o. female with {PFD symptoms:24771} who presents today for a pessary fitting.    Past Medical History: Patient  has a past medical history of Anemia, Anxiety, Depression, Fibromyalgia, Hyperglycemia, HYPERGLYCEMIA, FASTING (08/22/2007), Hypertension, Multiple thyroid nodules, and Rheumatoid arthritis(714.0).   Past Surgical History: She  has a past surgical history that includes Breast biopsy; Joint replacement; Total knee arthroplasty (Left, 07/07/2014); and Abdominal hysterectomy.   Medications: She has a current medication list which includes the following prescription(s): bupropion, calcium carbonate-vit d-min, escitalopram, hydrochlorothiazide, hydrocodone-acetaminophen, leflunomide, losartan, methocarbamol, womens daily formula, prednisone, vitamin c, and vitamin e.   Allergies: Patient is allergic to sulfonamide derivatives and remicade [infliximab].   Social History: Patient  reports that she has never smoked. She has never used smokeless tobacco. She reports that she does not drink alcohol and does not use drugs.      Objective:    There were no vitals taken for this visit. Gen: No apparent distress, A&O x 3. Pelvic Exam: Normal external female genitalia; Bartholin's and Skene's glands normal in appearance; urethral meatus {urethra:24773}, no urethral masses or discharge.   A size *** {pessary type:24772} pessary was fitted. It was comfortable, stayed in place with valsalva and was an appropriate size on examination, with one finger fitting between the pessary and the vaginal walls. We tied a string to it and the patient demonstrated proper removal and replacement.  No flowsheet data found.   Assessment/Plan:    Assessment: Haley Roberts is a 75 y.o. with {PFD symptoms:24771} who presents for a pessary fitting. Plan: She was  fitted with a *** {pessary type:24772} pessary. She will {pessary plan:24776}. She will {use:24778} {lubricant:24777}.   Follow-up in *** {days/wks/mos/yrs:310907} for a pessary check or sooner as needed.  All questions were answered.    Jaquita Folds, MD

## 2020-04-02 ENCOUNTER — Ambulatory Visit: Payer: Medicare Other | Admitting: Obstetrics and Gynecology

## 2020-05-24 ENCOUNTER — Telehealth: Payer: Self-pay

## 2020-05-24 NOTE — Telephone Encounter (Signed)
Phone constantly rang unable to schedule an AWV

## 2020-06-28 DIAGNOSIS — G8929 Other chronic pain: Secondary | ICD-10-CM | POA: Diagnosis not present

## 2020-06-28 DIAGNOSIS — M5442 Lumbago with sciatica, left side: Secondary | ICD-10-CM | POA: Diagnosis not present

## 2020-06-28 DIAGNOSIS — Z6829 Body mass index (BMI) 29.0-29.9, adult: Secondary | ICD-10-CM | POA: Diagnosis not present

## 2020-06-28 DIAGNOSIS — M5441 Lumbago with sciatica, right side: Secondary | ICD-10-CM | POA: Diagnosis not present

## 2020-06-28 DIAGNOSIS — I10 Essential (primary) hypertension: Secondary | ICD-10-CM | POA: Diagnosis not present

## 2020-07-06 ENCOUNTER — Other Ambulatory Visit: Payer: Self-pay | Admitting: Neurosurgery

## 2020-07-06 DIAGNOSIS — M5442 Lumbago with sciatica, left side: Secondary | ICD-10-CM

## 2020-07-06 DIAGNOSIS — G8929 Other chronic pain: Secondary | ICD-10-CM

## 2020-07-23 ENCOUNTER — Other Ambulatory Visit: Payer: Medicare Other

## 2020-07-26 ENCOUNTER — Other Ambulatory Visit: Payer: Medicare Other

## 2020-07-28 ENCOUNTER — Other Ambulatory Visit: Payer: Medicare Other

## 2020-07-29 ENCOUNTER — Other Ambulatory Visit: Payer: Medicare Other

## 2020-07-30 ENCOUNTER — Other Ambulatory Visit: Payer: Medicare Other

## 2020-08-02 ENCOUNTER — Other Ambulatory Visit: Payer: Medicare Other

## 2020-08-04 ENCOUNTER — Other Ambulatory Visit: Payer: Medicare Other

## 2020-08-06 ENCOUNTER — Other Ambulatory Visit: Payer: Medicare Other

## 2020-08-08 ENCOUNTER — Other Ambulatory Visit: Payer: Medicare Other

## 2020-08-12 ENCOUNTER — Other Ambulatory Visit: Payer: Medicare Other

## 2020-08-12 ENCOUNTER — Telehealth (INDEPENDENT_AMBULATORY_CARE_PROVIDER_SITE_OTHER): Payer: Medicare Other | Admitting: Family Medicine

## 2020-08-12 DIAGNOSIS — U071 COVID-19: Secondary | ICD-10-CM | POA: Diagnosis not present

## 2020-08-12 MED ORDER — BENZONATATE 100 MG PO CAPS: 100.0000 mg | ORAL_CAPSULE | Freq: Three times a day (TID) | ORAL | 0 refills | Status: DC | PRN

## 2020-08-12 MED ORDER — MOLNUPIRAVIR EUA 200MG CAPSULE: 4.0000 | ORAL_CAPSULE | Freq: Two times a day (BID) | ORAL | 0 refills | Status: AC

## 2020-08-12 NOTE — Patient Instructions (Addendum)
HOME CARE TIPS:  -I sent the medication(s) we discussed to your pharmacy: Meds ordered this encounter  Medications   molnupiravir EUA 200 mg CAPS    Sig: Take 4 capsules (800 mg total) by mouth 2 (two) times daily for 5 days.    Dispense:  40 capsule    Refill:  0   benzonatate (TESSALON PERLES) 100 MG capsule    Sig: Take 1 capsule (100 mg total) by mouth 3 (three) times daily as needed.    Dispense:  20 capsule    Refill:  0     -I sent in the [place] treatment or referral you requested per our discussion. Please see the information provided below and discuss further with the pharmacist/treatment team.   -can use nasal saline a few times per day if you have nasal congestion  -stay hydrated, drink plenty of fluids and eat small healthy meals - avoid dairy  -follow up with your doctor in 2-3 days unless improving and feeling better  -stay home while sick, except to seek medical care. If you have COVID19, ideally it would be best to stay home for a full 10 days since the onset of symptoms PLUS one day of no fever and feeling better. Wear a good mask that fits snugly (such as N95 or KN95) if around others to reduce the risk of transmission.  It was nice to meet you today, and I really hope you are feeling better soon. I help  out with telemedicine visits on Tuesdays and Thursdays and am available for visits on those days. If you have any concerns or questions following this visit please schedule a follow up visit with your Primary Care doctor or seek care at a local urgent care clinic to avoid delays in care.    Seek in person care or schedule a follow up video visit promptly if your symptoms worsen, new concerns arise or you are not improving with treatment. Call 911 and/or seek emergency care if your symptoms are severe or life threatening.    Fact Sheet for Patients And Caregivers Emergency Use Authorization (EUA) Of LAGEVRIOT (molnupiravir) capsules For Coronavirus  Disease 2019 (COVID-19)  What is the most important information I should know about LAGEVRIO? LAGEVRIO may cause serious side effects, including: ? LAGEVRIO may cause harm to your unborn baby. It is not known if LAGEVRIO will harm your baby if you take LAGEVRIO during pregnancy. o LAGEVRIO is not recommended for use in pregnancy. o LAGEVRIO has not been studied in pregnancy. LAGEVRIO was studied in pregnant animals only. When LAGEVRIO was given to pregnant animals, LAGEVRIO caused harm to their unborn babies. o You and your healthcare provider may decide that you should take LAGEVRIO during pregnancy if there are no other COVID-19 treatment options approved or authorized by the FDA that are accessible or clinically appropriate for you. o If you and your healthcare provider decide that you should take LAGEVRIO during pregnancy, you and your healthcare provider should discuss the known and potential benefits and the potential risks of taking LAGEVRIO during pregnancy. For individuals who are able to become pregnant: ? You should use a reliable method of birth control (contraception) consistently and correctly during treatment with LAGEVRIO and for 4 days after the last dose of LAGEVRIO. Talk to your healthcare provider about reliable birth control methods. ? Before starting treatment with [facility] your healthcare provider may do a pregnancy test to see if you are pregnant before starting treatment with LAGEVRIO. ? Tell your  healthcare provider right away if you become pregnant or think you may be pregnant during treatment with LAGEVRIO. Pregnancy Surveillance Program: ? There is a pregnancy surveillance program for individuals who take LAGEVRIO during pregnancy. The purpose of this program is to collect information about the health of you and your baby. Talk to your healthcare provider about how to take part in this program. ? If you take LAGEVRIO during pregnancy and you agree to  participate in the pregnancy surveillance program and allow your healthcare provider to share your information with [place], then your healthcare provider will report your use of [place] during pregnancy to [place]. by calling [phone] or PeacefulBlog.es. For individuals who are sexually active with partners who are able to become pregnant: ? It is not known if LAGEVRIO can affect sperm. While the risk is regarded as low, animal studies to fully assess the potential for LAGEVRIO to affect the babies of males treated with LAGEVRIO have not been completed. A reliable method of birth control (contraception) should be used consistently and correctly during treatment with LAGEVRIO and for at least 3 months after the last dose. The risk to sperm beyond 3 months is not known. Studies to understand the risk to sperm beyond 3 months are ongoing. Talk to your healthcare provider about reliable birth control methods. Talk to your healthcare provider if you have questions or concerns about how LAGEVRIO may affect sperm. You are being given this fact sheet because your healthcare provider believes it is necessary to provide you with LAGEVRIO for the treatment of adults with mild-to-moderate coronavirus disease 2019 (COVID-19) with positive results of direct SARS-CoV-2 viral testing, and who are at high risk for progression to severe COVID-19 including hospitalization or death, and for whom other COVID-19 treatment options approved or authorized by the FDA are not accessible or clinically appropriate. The U.S. Food and Drug Administration (FDA) has issued an Emergency Use Authorization (EUA) to make LAGEVRIO available during the COVID-19 pandemic (for more details about an EUA please see "What is an Emergency Use Authorization?" at the end of this document). LAGEVRIO is not an FDA-approved medicine in the [place]. Read this Fact Sheet for information  about LAGEVRIO. Talk to your healthcare provider about your options if you have any questions. It is your choice to take LAGEVRIO.  What is COVID-19? COVID-19 is caused by a virus called a coronavirus. You can get COVID-19 through close contact with another person who has the virus. COVID-19 illnesses have ranged from very mild-to-severe, including illness resulting in death. While information so far suggests that most COVID-19 illness is mild, serious illness can happen and may cause some of your other medical conditions to become worse. Older people and people of all ages with severe, long lasting (chronic) medical conditions like heart disease, lung disease and diabetes, for example seem to be at higher risk of being hospitalized for COVID-19.  What is LAGEVRIO? LAGEVRIO is an investigational medicine used to treat mild-to-moderate COVID-19 in adults: ? with positive results of direct SARS-CoV-2 viral testing, and ? who are at high risk for progression to severe COVID-19 including hospitalization or death, and for whom other COVID-19 treatment options approved or authorized by the FDA are not accessible or clinically appropriate. The FDA has authorized the emergency use of LAGEVRIO for the treatment of mild-tomoderate COVID-19 in adults under an EUA. For more information on EUA, see the "What is an Emergency Use Authorization (EUA)?" section at the  end of this Fact Sheet. LAGEVRIO is not authorized: ? for use in people less than 88 years of age. ? for prevention of COVID-19. ? for people needing hospitalization for COVID-19. ? for use for longer than 5 consecutive days.  What should I tell my healthcare provider before I take LAGEVRIO? Tell your healthcare provider if you: ? Have any allergies ? Are breastfeeding or plan to breastfeed ? Have any serious illnesses ? Are taking any medicines (prescription, over-the-counter, vitamins, or herbal products).  How do I take LAGEVRIO? ?  Take LAGEVRIO exactly as your healthcare provider tells you to take it. ? Take 4 capsules of LAGEVRIO every 12 hours (for example, at 8 am and at 8 pm) ? Take LAGEVRIO for 5 days. It is important that you complete the full 5 days of treatment with LAGEVRIO. Do not stop taking LAGEVRIO before you complete the full 5 days of treatment, even if you feel better. ? Take LAGEVRIO with or without food. ? You should stay in isolation for as long as your healthcare provider tells you to. Talk to your healthcare provider if you are not sure about how to properly isolate while you have COVID-19. ? Swallow LAGEVRIO capsules whole. Do not open, break, or crush the capsules. If you cannot swallow capsules whole, tell your healthcare provider. ? What to do if you miss a dose: o If it has been less than 10 hours since the missed dose, take it as soon as you remember o If it has been more than 10 hours since the missed dose, skip the missed dose and take your dose at the next scheduled time. ? Do not double the dose of LAGEVRIO to make up for a missed dose.  What are the important possible side effects of LAGEVRIO? ? See, "What is the most important information I should know about LAGEVRIO?" ? Allergic Reactions. Allergic reactions can happen in people taking LAGEVRIO, even after only 1 dose. Stop taking LAGEVRIO and call your healthcare provider right away if you get any of the following symptoms of an allergic reaction: o hives o rapid heartbeat o trouble swallowing or breathing o swelling of the mouth, lips, or face o throat tightness o hoarseness o skin rash The most common side effects of LAGEVRIO are: ? diarrhea ? nausea ? dizziness These are not all the possible side effects of LAGEVRIO. Not many people have taken LAGEVRIO. Serious and unexpected side effects may happen. This medicine is still being studied, so it is possible that all of the risks are not known at this time.  What other  treatment choices are there?  Veklury (remdesivir) is FDA-approved as an intravenous (IV) infusion for the treatment of mildto-moderate PNPYY-51 in certain adults and children. Talk with your doctor to see if Mari[place]an Heath is appropriate for you. Like LAGEVRIO, FDA may also allow for the emergency use of other medicines to treat people with COVID-19. Go to LacrosseProperties.si for more information. It is your choice to be treated or not to be treated with LAGEVRIO. Should you decide not to take it, it will not change your standard medical care.  What if I am breastfeeding? Breastfeeding is not recommended during treatment with LAGEVRIO and for 4 days after the last dose of LAGEVRIO. If you are breastfeeding or plan to breastfeed, talk to your healthcare provider about your options and specific situation before taking LAGEVRIO.  How do I report side effects with LAGEVRIO? Contact your healthcare provider if you have any  side effects that bother you or do not go away. Report side effects to FDA MedWatch at SmoothHits.hu or call 1-800-FDA-1088 (1- 417-832-2103).  How should I store [place]? ? Store LAGEVRIO capsules at room temperature between 13F to 37F (20C to 25C). ? Keep LAGEVRIO and all medicines out of the reach of children and pets. How can I learn more about COVID-19? ? Ask your healthcare provider. ? Visit SeekRooms.co.uk ? Contact your local or state public health department. ? Call [place] at [phone] (toll free in the [place] ? Visit www.molnupiravir.com  What Is an Emergency Use Authorization (EUA)? The [place] FDA has made [place] available under an emergency access mechanism called an Emergency Use Authorization (EUA) The EUA is supported by a Presenter, broadcasting Health and Human Service (HHS) declaration that circumstances exist to  justify emergency use of drugs and biological products during the COVID-19 pandemic. LAGEVRIO for the treatment of mild-to-moderate COVID-19 in adults with positive results of direct SARS-CoV-2 viral testing, who are at high risk for progression to severe COVID-19, including hospitalization or death, and for whom alternative COVID-19 treatment options approved or authorized by FDA are not accessible or clinically appropriate, has not undergone the same type of review as an FDA-approved product. In issuing an EUA under the QHQIX-65 public health emergency, the FDA has determined, among other things, that based on the total amount of scientific evidence available including data from adequate and well-controlled clinical trials, if available, it is reasonable to believe that the product may be effective for diagnosing, treating, or preventing COVID-19, or a serious or life-threatening disease or condition caused by COVID-19; that the known and potential benefits of the product, when used to diagnose, treat, or prevent such disease or condition, outweigh the known and potential risks of such product; and that there are no adequate, approved, and available alternatives.  All of these criteria must be met to allow for the product to be used in the treatment of patients during the COVID-19 pandemic. The EUA for LAGEVRIO is in effect for the duration of the COVID-19 declaration justifying emergency use of LAGEVRIO, unless terminated or revoked (after which LAGEVRIO may no longer be used under the EUA). For patent information: http://rogers.info/ Copyright  2021-2022 [place] [place] and its affiliates. All rights reserved. [id number] Revised: March 2022

## 2020-08-12 NOTE — Progress Notes (Signed)
Virtual Visit via Video Note  I connected with Haley Roberts  on 08/12/20 at  3:40 PM EDT by a video enabled telemedicine application and verified that I am speaking with the correct person using two identifiers.  Location patient: home, [place] Location provider:work or home office Persons participating in the virtual visit: patient, provider  I discussed the limitations of evaluation and management by telemedicine and the availability of in person appointments. The patient expressed understanding and agreed to proceed.   HPI:  Acute telemedicine visit for Covid19: -Onset: yesterday; she and her husband tested positive today on home tests -Symptoms include: nasal congestion, scratchy throat, cough -Denies:fever, body aches, NVD, CP, SOB, inability to eat/drink/get out of bed -Pertinent past medical history: see below -Pertinent medication allergies:  Allergies  Allergen Reactions   Sulfonamide Derivatives Other (See Comments)    BAD HEADACHES/CHILL/FEVER   Remicade [Infliximab] Other (See Comments)    BP increases   -COVID-19 vaccine status: 2 doses + 1 booster -no labs recently  ROS: See pertinent positives and negatives per HPI.  Past Medical History:  Diagnosis Date   Anemia    Anxiety    Depression    Fibromyalgia    Hyperglycemia    HYPERGLYCEMIA, FASTING 08/22/2007   Hypertension    Multiple thyroid nodules    gets ultra sound yearly on thryoid   Rheumatoid arthritis(714.0)     Past Surgical History:  Procedure Laterality Date   ABDOMINAL HYSTERECTOMY     with BSO   BREAST BIOPSY     JOINT REPLACEMENT     right knee replacement 2012   TOTAL KNEE ARTHROPLASTY Left 07/07/2014   Procedure: LEFT TOTAL KNEE ARTHROPLASTY;  Surgeon: Paralee Cancel, MD;  Location: [facility];  Service: Orthopedics;  Laterality: Left;     Current Outpatient Medications:    benzonatate (TESSALON PERLES) 100 MG capsule, Take 1 capsule (100 mg total) by mouth 3 (three) times daily as needed., Disp: 20  capsule, Rfl: 0   molnupiravir EUA 200 mg CAPS, Take 4 capsules (800 mg total) by mouth 2 (two) times daily for 5 days., Disp: 40 capsule, Rfl: 0   buPROPion (WELLBUTRIN XL) 300 MG 24 hr tablet, Take 150 mg by mouth daily. , Disp: , Rfl:    Calcium Carbonate-Vit D-Min (CALTRATE PLUS PO), Take 1 tablet by mouth 2 (two) times daily.  , Disp: , Rfl:    escitalopram (LEXAPRO) 10 MG tablet, Take 10 mg by mouth daily. Dr.Kaur, Disp: , Rfl:    hydrochlorothiazide (HYDRODIURIL) 12.5 MG tablet, Take 1 tablet (12.5 mg total) by mouth daily., Disp: 90 tablet, Rfl: 3   HYDROcodone-acetaminophen (NORCO) 10-325 MG tablet, Take 1 tablet by mouth every 6 (six) hours as needed., Disp: , Rfl:    leflunomide (ARAVA) 10 MG tablet, Take 10 mg by mouth daily., Disp: , Rfl:    losartan (COZAAR) 100 MG tablet, Take 1 tablet (100 mg total) by mouth daily., Disp: 90 tablet, Rfl: 3   methocarbamol (ROBAXIN) 500 MG tablet, Take 1 tablet (500 mg total) by mouth every 6 (six) hours as needed for muscle spasms., Disp: 50 tablet, Rfl: 0   Multiple Vitamins-Minerals (WOMENS DAILY FORMULA) TABS, Take 1 tablet by mouth daily.  , Disp: , Rfl:    predniSONE (DELTASONE) 5 MG tablet, Take 4 mg by mouth daily. Dr.Anderson , Disp: , Rfl:    vitamin C (ASCORBIC ACID) 500 MG tablet, Take 500 mg by mouth daily.  , Disp: , Rfl:  vitamin E 400 UNIT capsule, Take 400 Units by mouth daily.  , Disp: , Rfl:   EXAM:  VITALS per patient if applicable:  GENERAL: alert, oriented, appears well and in no acute distress  HEENT: atraumatic, conjunttiva clear, no obvious abnormalities on inspection of external nose and ears  NECK: normal movements of the head and neck  LUNGS: on inspection no signs of respiratory distress, breathing rate appears normal, no obvious gross SOB, gasping or wheezing  CV: no obvious cyanosis  MS: moves all visible extremities without noticeable abnormality  PSYCH/NEURO: pleasant and cooperative, no obvious  depression or anxiety, speech and thought processing grossly intact  ASSESSMENT AND PLAN:  Discussed the following assessment and plan:  COVID-19   Discussed treatment options including infusions, Paxlovid and molnupiravir, ideal treatment window, potential complications, isolation and precautions for COVID-19.  After lengthy discussion, the patient opted for treatment with molnupiravir due to being higher risk for complications of covid or severe disease and other factors. She wanted to avoid infusions, labwork (none recently) and interactions and adjustments with her pain medication. Discussed EUA status of this drug and the fact that there is preliminary limited knowledge of risks/interactions/side effects per EUA document vs possible benefits and precautions. This information was shared with patient during the visit and also was provided in patient instructions. Also, advised that patient discuss risks/interactions and use with pharmacist/treatment team as well. The patient did want a prescription for cough, Tessalon Rx sent.  Other symptomatic care measures summarized in patient instructions. Advised to seek prompt in person care if worsening, new symptoms arise, or if is not improving with treatment. Discussed options for inperson care if PCP office not available. Did let this patient know that I only do telemedicine on Tuesdays and Thursdays for [place]. Advised to schedule follow up visit with PCP or [facility] if any further questions or concerns to avoid delays in care.   I discussed the assessment and treatment plan with the patient. The patient was provided an opportunity to ask questions and all were answered. The patient agreed with the plan and demonstrated an understanding of the instructions.     Lucretia Kern, DO

## 2020-08-17 ENCOUNTER — Other Ambulatory Visit: Payer: Medicare Other

## 2020-08-19 ENCOUNTER — Inpatient Hospital Stay: Admission: RE | Admit: 2020-08-19 | Payer: Medicare Other | Source: Ambulatory Visit

## 2020-08-29 ENCOUNTER — Other Ambulatory Visit: Payer: Medicare Other

## 2020-09-05 ENCOUNTER — Encounter (HOSPITAL_BASED_OUTPATIENT_CLINIC_OR_DEPARTMENT_OTHER): Payer: Self-pay

## 2020-09-05 ENCOUNTER — Other Ambulatory Visit: Payer: Self-pay

## 2020-09-05 ENCOUNTER — Emergency Department (HOSPITAL_BASED_OUTPATIENT_CLINIC_OR_DEPARTMENT_OTHER): Payer: Medicare Other

## 2020-09-05 ENCOUNTER — Other Ambulatory Visit: Payer: Medicare Other

## 2020-09-05 ENCOUNTER — Emergency Department (HOSPITAL_BASED_OUTPATIENT_CLINIC_OR_DEPARTMENT_OTHER)
Admission: EM | Admit: 2020-09-05 | Discharge: 2020-09-05 | Disposition: A | Discharge status: Home / Self Care | Payer: Medicare Other | Attending: Emergency Medicine | Admitting: Emergency Medicine

## 2020-09-05 DIAGNOSIS — M7989 Other specified soft tissue disorders: Secondary | ICD-10-CM | POA: Diagnosis not present

## 2020-09-05 DIAGNOSIS — R2241 Localized swelling, mass and lump, right lower limb: Secondary | ICD-10-CM | POA: Diagnosis not present

## 2020-09-05 DIAGNOSIS — S72431A Displaced fracture of medial condyle of right femur, initial encounter for closed fracture: Secondary | ICD-10-CM

## 2020-09-05 DIAGNOSIS — G8929 Other chronic pain: Secondary | ICD-10-CM

## 2020-09-05 DIAGNOSIS — M79661 Pain in right lower leg: Secondary | ICD-10-CM | POA: Diagnosis not present

## 2020-09-05 DIAGNOSIS — Z96653 Presence of artificial knee joint, bilateral: Secondary | ICD-10-CM | POA: Insufficient documentation

## 2020-09-05 DIAGNOSIS — S8991XA Unspecified injury of right lower leg, initial encounter: Secondary | ICD-10-CM | POA: Diagnosis present

## 2020-09-05 DIAGNOSIS — S72434A Nondisplaced fracture of medial condyle of right femur, initial encounter for closed fracture: Secondary | ICD-10-CM | POA: Diagnosis not present

## 2020-09-05 DIAGNOSIS — S82141A Displaced bicondylar fracture of right tibia, initial encounter for closed fracture: Secondary | ICD-10-CM | POA: Insufficient documentation

## 2020-09-05 DIAGNOSIS — W1839XA Other fall on same level, initial encounter: Secondary | ICD-10-CM | POA: Insufficient documentation

## 2020-09-05 DIAGNOSIS — M5441 Lumbago with sciatica, right side: Secondary | ICD-10-CM | POA: Diagnosis not present

## 2020-09-05 DIAGNOSIS — R609 Edema, unspecified: Secondary | ICD-10-CM | POA: Diagnosis not present

## 2020-09-05 DIAGNOSIS — S72421A Displaced fracture of lateral condyle of right femur, initial encounter for closed fracture: Secondary | ICD-10-CM

## 2020-09-05 DIAGNOSIS — Z79899 Other long term (current) drug therapy: Secondary | ICD-10-CM | POA: Diagnosis not present

## 2020-09-05 DIAGNOSIS — M79604 Pain in right leg: Secondary | ICD-10-CM | POA: Diagnosis not present

## 2020-09-05 DIAGNOSIS — I10 Essential (primary) hypertension: Secondary | ICD-10-CM | POA: Diagnosis not present

## 2020-09-05 DIAGNOSIS — M79606 Pain in leg, unspecified: Secondary | ICD-10-CM | POA: Diagnosis not present

## 2020-09-05 DIAGNOSIS — R450 Nervousness: Secondary | ICD-10-CM | POA: Diagnosis not present

## 2020-09-05 DIAGNOSIS — R531 Weakness: Secondary | ICD-10-CM | POA: Diagnosis not present

## 2020-09-05 DIAGNOSIS — M25561 Pain in right knee: Secondary | ICD-10-CM | POA: Diagnosis not present

## 2020-09-05 DIAGNOSIS — W19XXXA Unspecified fall, initial encounter: Secondary | ICD-10-CM | POA: Diagnosis not present

## 2020-09-05 MED ORDER — HYDROCODONE-ACETAMINOPHEN 5-325 MG PO TABS
2.0000 | ORAL_TABLET | Freq: Once | ORAL | Status: AC
  Administered 2020-09-05: 2 via ORAL
  Filled 2020-09-05: qty 2

## 2020-09-05 MED ORDER — HYDROMORPHONE HCL 1 MG/ML IJ SOLN
2.0000 mg | Freq: Once | INTRAMUSCULAR | Status: AC
Start: 2020-09-05 — End: 2020-09-05
  Administered 2020-09-05: 2 mg via INTRAMUSCULAR
  Filled 2020-09-05: qty 2

## 2020-09-05 NOTE — ED Notes (Signed)
Pt states she needed to use the restroom, but not able to get up due to pain. Purewick placed. Tolerated well. Able to urinate.

## 2020-09-05 NOTE — ED Provider Notes (Signed)
[place] [facility]ER[facility] DEPT Provider Note   CSN: [id number] Arrival date & time: 09/05/20  1518     History Chief Complaint  Patient presents with   bilat. hip/leg pain    Haley Roberts is a 75 y.o. female.  Patient with a history of chronic back pain with right-sided sciatica followed by Dr. Vertell Limber from neurosurgery.  Patient is on hydrocodone 10 mg-treatment 25 mg for this.  Just received 30-day supply on August 4.  Patient with a fall about a week ago.  Since that time has had swelling to her right lower extremity from the knee down.  Questionable ankle sprain with the fall.  No new pain in the right hip or right thigh area.      Past Medical History:  Diagnosis Date   Anemia    Anxiety    Depression    Fibromyalgia    Hyperglycemia    HYPERGLYCEMIA, FASTING 08/22/2007   Hypertension    Multiple thyroid nodules    gets ultra sound yearly on thryoid   Rheumatoid arthritis(714.0)     Patient Active Problem List   Diagnosis Date Noted   Difficulty urinating 11/18/2019   Bilateral leg edema 05/03/2018   Prediabetes 12/27/2017   Fibromyalgia 05/28/2015   S/P left TKA 07/07/2014   S/P knee replacement 07/07/2014   Right knee DJD 08/29/2010   Rheumatoid arthritis ([place]) 03/30/2010   Osteoarthritis 01/12/2009   Non-toxic nodular goiter 08/22/2007   Essential hypertension 08/22/2007   ANEMIA-NOS 05/31/2006   Depression 05/31/2006   ABNORMAL THYROID FUNCTION TESTS 05/31/2006    Past Surgical History:  Procedure Laterality Date   ABDOMINAL HYSTERECTOMY     with BSO   BREAST BIOPSY     JOINT REPLACEMENT     right knee replacement 2012   TOTAL KNEE ARTHROPLASTY Left 07/07/2014   Procedure: LEFT TOTAL KNEE ARTHROPLASTY;  Surgeon: Paralee Cancel, MD;  Location: [facility];  Service: Orthopedics;  Laterality: Left;     OB History     Gravida  3   Para  3   Term      Preterm      AB      Living  3      SAB      IAB      Ectopic       Multiple      Live Births              Family History  Problem Relation Age of Onset   Cancer Mother 50       breast   Arthritis Father    Hypertension Father    Diabetes Sister    Hypertension Brother    Diabetes Son    Depression Son     Social History   Tobacco Use   Smoking status: Never   Smokeless tobacco: Never  Vaping Use   Vaping Use: Never used  Substance Use Topics   Alcohol use: No   Drug use: No    Home Medications Prior to Admission medications   Medication Sig Start Date End Date Taking? Authorizing Provider  benzonatate (TESSALON PERLES) 100 MG capsule Take 1 capsule (100 mg total) by mouth 3 (three) times daily as needed. 08/12/20   Lucretia Kern, DO  buPROPion (WELLBUTRIN XL) 300 MG 24 hr tablet Take 150 mg by mouth daily.     [provider]  Calcium Carbonate-Vit D-Min (CALTRATE PLUS PO) Take 1 tablet by mouth 2 (two) times daily.  [provider]  escitalopram (LEXAPRO) 10 MG tablet Take 10 mg by mouth daily. Dr.Kaur    [provider]  hydrochlorothiazide (HYDRODIURIL) 12.5 MG tablet Take 1 tablet (12.5 mg total) by mouth daily. 11/18/19   Binnie Rail, MD  HYDROcodone-acetaminophen (NORCO) 10-325 MG tablet Take 1 tablet by mouth every 6 (six) hours as needed. 10/22/19   [provider]  leflunomide (ARAVA) 10 MG tablet Take 10 mg by mouth daily. 11/18/19   [provider]  losartan (COZAAR) 100 MG tablet Take 1 tablet (100 mg total) by mouth daily. 11/18/19   Binnie Rail, MD  methocarbamol (ROBAXIN) 500 MG tablet Take 1 tablet (500 mg total) by mouth every 6 (six) hours as needed for muscle spasms. 07/08/14   Danae Orleans, PA-C  Multiple Vitamins-Minerals (WOMENS DAILY FORMULA) TABS Take 1 tablet by mouth daily.      [provider]  predniSONE (DELTASONE) 5 MG tablet Take 4 mg by mouth daily. Dr.Anderson     [provider]  vitamin C (ASCORBIC ACID) 500 MG tablet Take 500 mg by  mouth daily.      [provider]  vitamin E 400 UNIT capsule Take 400 Units by mouth daily.      [provider]    Allergies    Sulfonamide derivatives and Remicade [infliximab]  Review of Systems   Review of Systems  Constitutional:  Negative for chills and fever.  HENT:  Negative for rhinorrhea and sore throat.   Eyes:  Negative for visual disturbance.  Respiratory:  Negative for cough and shortness of breath.   Cardiovascular:  Positive for leg swelling. Negative for chest pain.  Gastrointestinal:  Negative for abdominal pain, diarrhea, nausea and vomiting.  Genitourinary:  Negative for dysuria.  Musculoskeletal:  Positive for back pain and joint swelling. Negative for neck pain.  Skin:  Negative for rash.  Neurological:  Negative for dizziness, light-headedness and headaches.  Hematological:  Does not bruise/bleed easily.  Psychiatric/Behavioral:  Negative for confusion.    Physical Exam Updated Vital Signs BP (!) 171/94   Pulse 84   Temp 98.5 F (36.9 C) (Oral)   Resp 16   SpO2 94%   Physical Exam Vitals and nursing note reviewed.  Constitutional:      General: She is not in acute distress.    Appearance: Normal appearance. She is well-developed.  HENT:     Head: Normocephalic and atraumatic.  Eyes:     Conjunctiva/sclera: Conjunctivae normal.  Cardiovascular:     Rate and Rhythm: Normal rate and regular rhythm.     Heart sounds: No murmur heard. Pulmonary:     Effort: Pulmonary effort is normal. No respiratory distress.     Breath sounds: Normal breath sounds.  Abdominal:     Palpations: Abdomen is soft.     Tenderness: There is no abdominal tenderness.  Musculoskeletal:        General: Swelling present.     Cervical back: Neck supple.     Right lower leg: Edema present.     Comments: Marked swelling of the right lower extremity from the knee below.  Slight erythema.  1+ pitting edema.  Dorsalis pedis pulse is 2+.  Cap refill to the right  foot intact.  Patient was swelling to the foot ankle and leg.  No swelling in the thigh.  Skin:    General: Skin is warm and dry.     Capillary Refill: Capillary refill takes less than 2  seconds.  Neurological:     General: No focal deficit present.     Mental Status: She is alert and oriented to person, place, and time.     Sensory: No sensory deficit.     Motor: Weakness present.     Comments: Patient with baseline weakness to the right lower extremity.    ED Results / Procedures / Treatments   Labs (all labs ordered are listed, but only abnormal results are displayed) Labs Reviewed - No data to display  EKG None  Radiology DG Tibia/Fibula Right  Result Date: 09/05/2020 CLINICAL DATA:  Fall 1 week ago. Persistent right leg pain and swelling. EXAM: RIGHT TIBIA AND FIBULA - 2 VIEW COMPARISON:  None. FINDINGS: Fracture suggested of the superior aspect of the medial femoral condyle above the femoral component of the total knee prosthesis. No other evidence of a fracture. Specifically, no evidence of a tibia or fibular fracture. Knee prosthetic components appear well seated and aligned. Ankle joint normally aligned. There is subcutaneous soft tissue edema extending from the knee through the ankle. IMPRESSION: 1. Possible acute fracture of the medial femoral condyle. This would be better evaluated with knee radiographs. 2. No other evidence of a fracture. No dislocation. No evidence of loosening of the knee joint orthopedic hardware. 3. Nonspecific soft tissue edema. Electronically Signed   By: Lajean Manes M.D.   On: 09/05/2020 16:40   DG Ankle Complete Right  Result Date: 09/05/2020 CLINICAL DATA:  [place] 1 week ago.  Right lower leg pain and swelling. EXAM: RIGHT ANKLE - COMPLETE 3+ VIEW COMPARISON:  None. FINDINGS: No fracture or bone lesion. Ankle joint is normally spaced and aligned. There is diffuse soft tissue swelling that extends into the foot. IMPRESSION: 1. No fracture.  No ankle  joint abnormality. 2. Nonspecific soft tissue swelling the ankle through the foot. Electronically Signed   By: Lajean Manes M.D.   On: 09/05/2020 16:37   CT Knee Right Wo Contrast  Result Date: 09/05/2020 CLINICAL DATA:  Fracture of the right medial femoral condyle and possible fracture of the lateral condyle seen on x-ray today. EXAM: CT OF THE right KNEE WITHOUT CONTRAST TECHNIQUE: Multidetector CT imaging of the right knee was performed according to the standard protocol. Multiplanar CT image reconstructions were also generated. COMPARISON:  Right knee radiographs 09/05/2020 FINDINGS: Bones/Joint/Cartilage Right total knee arthroplasty with patellar femoral component. Streak artifact arising from the arthroplasty components limits evaluation. There is evidence of focal cortical disruption with an oblique periprosthetic fracture involving the medial femoral condyle as seen on prior radiographs. No definite fracture identified in the lateral femoral condyle. No dislocation of the knee joint or patella. Moderate effusion. Ligaments Suboptimally assessed by CT. Muscles and Tendons Limit evaluation.  No gross abnormalities identified. Soft tissues Diffuse soft tissue edema. IMPRESSION: 1. Right total knee arthroplasty. 2. Nondisplaced periprosthetic fracture of the medial femoral condyle. 3. Moderate effusion. Electronically Signed   By: Lucienne Capers M.D.   On: 09/05/2020 21:17   US Venous Img Lower Right (DVT Study)  Result Date: 09/05/2020 CLINICAL DATA:  Right leg swelling EXAM: RIGHT LOWER EXTREMITY VENOUS DOPPLER ULTRASOUND TECHNIQUE: Gray-scale sonography with compression, as well as color and duplex ultrasound, were performed to evaluate the deep venous system(s) from the level of the common femoral vein through the popliteal and proximal calf veins. COMPARISON:  None. FINDINGS: VENOUS Normal compressibility of the common femoral, superficial femoral, and popliteal veins, as well as the visualized  calf veins. Visualized portions  of profunda femoral vein and great saphenous vein unremarkable. No filling defects to suggest DVT on grayscale or color Doppler imaging. Doppler waveforms show normal direction of venous flow, normal respiratory plasticity and response to augmentation. Limited views of the contralateral common femoral vein are unremarkable. OTHER None. Limitations: none IMPRESSION: No evidence of right lower extremity DVT. Electronically Signed   By: Rolm Baptise M.D.   On: 09/05/2020 17:22   DG Knee Complete 4 Views Right  Result Date: 09/05/2020 CLINICAL DATA:  RIGHT knee pain after falling 1 week ago. EXAM: RIGHT KNEE - COMPLETE 4+ VIEW COMPARISON:  None FINDINGS: Patient has had prior RIGHT knee arthroplasty. The femoral and tibial components appear well seated. There is linear lucency and step-off along the MEDIAL femoral condyle, suspicious for nondisplaced fracture. Possible LATERAL femoral condyles fracture. Small joint effusion noted. IMPRESSION: 1. Suspect minimally displaced fracture along the MEDIAL femoral condyle. Possible fracture along the LATERAL condyle. 2. Small effusion. Electronically Signed   By: Nolon Nations M.D.   On: 09/05/2020 17:48   DG Foot Complete Right  Result Date: 09/05/2020 CLINICAL DATA:  One week of leg swelling following fall. RIGHT lower leg pain and swelling in a 75 year old female. EXAM: RIGHT FOOT COMPLETE - 3+ VIEW COMPARISON:  Prior MRI dated March of 2012. FINDINGS: Severe first metatarsophalangeal osteoarthritic changes. Marked midfoot degenerative changes about the tarsal metatarsal joints. Diffuse soft tissue swelling about the foot and ankle. Soft tissue swelling worse over the forefoot. Avulsion fracture along the lateral calcaneus on the AP view. IMPRESSION: 1. Avulsion fracture from the anterior calcaneus compatible with extensor digitorum brevis avulsion best seen on the AP view. 2. Severe first metatarsophalangeal and midfoot  degenerative changes. 3. Diffuse soft tissue swelling, correlate with signs of infection. Electronically Signed   By: Zetta Bills M.D.   On: 09/05/2020 16:46    Procedures Procedures   Medications Ordered in ED Medications  HYDROmorphone (DILAUDID) injection 2 mg (2 mg Intramuscular Given 09/05/20 1625)  HYDROcodone-acetaminophen (NORCO/VICODIN) 5-325 MG per tablet 2 tablet (2 tablets Oral Given 09/05/20 2037)    ED Course  I have reviewed the triage vital signs and the nursing notes.  Pertinent labs & imaging results that were available during my care of the patient were reviewed by me and considered in my medical decision making (see chart for details).    MDM Rules/Calculators/A&P                           Patient with significant swelling to the right lower extremity from the knee and below.  Patient did have a fall about a week ago.  Her leg did get trapped behind her.  Initial x-rays raise concerns for avulsion fracture in the calcaneus part of the foot.  But clinically not significant.  Raise some concerns about possible knee fracture.  So x-rays of the knee were done which raise concerns about bilateral condylar fracture.  CT scan was done after talking to on-call [facility].  They recommended the CT.  CT shows it just to be the medial condyle of the femur.  Knee immobilizer nonweightbearing follow-up with [facility].  Patient has pain medication at home.  Patient has a wheelchair to use.  Patient would not be very stable with crutches but she has a wheelchair she can use.  Deep vein thrombosis was ruled out with ultrasound which was the initial concern.  No evidence of deep vein thrombosis.  Clinically do  not feel it is highly there is an infection no evidence of real cellulitis.  Think all the swelling is due to the avulsion fracture in the foot on the calcaneus as well as the condylar fracture of the distal femur.   Final Clinical Impression(s) / ED Diagnoses Final  diagnoses:  Chronic right-sided low back pain with right-sided sciatica  Right leg swelling  Closed bicondylar fracture of right femur, initial encounter [facility])    Rx / [place] Orders ED Discharge Orders     None        Fredia Sorrow, MD 09/05/20 2235

## 2020-09-05 NOTE — ED Triage Notes (Signed)
She reports low back and bilat. Hip area pain which she has had "for a long time now", for which she sees Dr. Vertell Limber (neurosurgery) with whom she has a pending appointment this Thurs. She is here with c/o a gentle fall about a week ago making this pain "even worse". She is in no distress. Bilat. DP pulses 2+ palpable.

## 2020-09-05 NOTE — Discharge Instructions (Addendum)
Do not weight-bear on the leg.  Keep the knee immobilizer in place.  Tomorrow give [facility] a call to set up an appointment.  Based on the condylar fractures of your thigh bone.  In addition there is evidence of an avulsion fracture in your foot.  But orthopedics says that that is clinically not significant at this time.  Take your pain medication that you have at home.  Follow-up with neurosurgery for your back pain as scheduled.

## 2020-09-09 ENCOUNTER — Encounter: Payer: Self-pay | Admitting: Internal Medicine

## 2020-09-09 ENCOUNTER — Encounter (HOSPITAL_COMMUNITY): Payer: Self-pay | Admitting: Emergency Medicine

## 2020-09-09 ENCOUNTER — Emergency Department (HOSPITAL_COMMUNITY): Payer: Medicare Other

## 2020-09-09 ENCOUNTER — Inpatient Hospital Stay (HOSPITAL_COMMUNITY)
Admission: EM | Admit: 2020-09-09 | Discharge: 2020-09-17 | DRG: 460 | Disposition: A | Discharge status: Rehabilitation Facility | Payer: Medicare Other | Attending: Internal Medicine | Admitting: Internal Medicine

## 2020-09-09 DIAGNOSIS — I10 Essential (primary) hypertension: Secondary | ICD-10-CM | POA: Diagnosis not present

## 2020-09-09 DIAGNOSIS — G8929 Other chronic pain: Secondary | ICD-10-CM | POA: Diagnosis present

## 2020-09-09 DIAGNOSIS — E663 Overweight: Secondary | ICD-10-CM | POA: Diagnosis present

## 2020-09-09 DIAGNOSIS — T380X5A Adverse effect of glucocorticoids and synthetic analogues, initial encounter: Secondary | ICD-10-CM | POA: Diagnosis present

## 2020-09-09 DIAGNOSIS — M48062 Spinal stenosis, lumbar region with neurogenic claudication: Secondary | ICD-10-CM | POA: Diagnosis not present

## 2020-09-09 DIAGNOSIS — M25461 Effusion, right knee: Secondary | ICD-10-CM | POA: Diagnosis not present

## 2020-09-09 DIAGNOSIS — G629 Polyneuropathy, unspecified: Secondary | ICD-10-CM | POA: Diagnosis present

## 2020-09-09 DIAGNOSIS — Z7952 Long term (current) use of systemic steroids: Secondary | ICD-10-CM | POA: Diagnosis not present

## 2020-09-09 DIAGNOSIS — K5900 Constipation, unspecified: Secondary | ICD-10-CM | POA: Diagnosis present

## 2020-09-09 DIAGNOSIS — F32A Depression, unspecified: Secondary | ICD-10-CM | POA: Diagnosis present

## 2020-09-09 DIAGNOSIS — F3289 Other specified depressive episodes: Secondary | ICD-10-CM

## 2020-09-09 DIAGNOSIS — S72434D Nondisplaced fracture of medial condyle of right femur, subsequent encounter for closed fracture with routine healing: Secondary | ICD-10-CM | POA: Diagnosis not present

## 2020-09-09 DIAGNOSIS — Z9889 Other specified postprocedural states: Secondary | ICD-10-CM | POA: Diagnosis not present

## 2020-09-09 DIAGNOSIS — Z90710 Acquired absence of both cervix and uterus: Secondary | ICD-10-CM | POA: Diagnosis not present

## 2020-09-09 DIAGNOSIS — R102 Pelvic and perineal pain: Secondary | ICD-10-CM | POA: Diagnosis not present

## 2020-09-09 DIAGNOSIS — Z419 Encounter for procedure for purposes other than remedying health state, unspecified: Secondary | ICD-10-CM

## 2020-09-09 DIAGNOSIS — R739 Hyperglycemia, unspecified: Secondary | ICD-10-CM | POA: Diagnosis not present

## 2020-09-09 DIAGNOSIS — M21371 Foot drop, right foot: Secondary | ICD-10-CM | POA: Diagnosis present

## 2020-09-09 DIAGNOSIS — M48061 Spinal stenosis, lumbar region without neurogenic claudication: Secondary | ICD-10-CM | POA: Diagnosis not present

## 2020-09-09 DIAGNOSIS — E871 Hypo-osmolality and hyponatremia: Secondary | ICD-10-CM | POA: Diagnosis present

## 2020-09-09 DIAGNOSIS — E876 Hypokalemia: Secondary | ICD-10-CM | POA: Diagnosis present

## 2020-09-09 DIAGNOSIS — D72829 Elevated white blood cell count, unspecified: Secondary | ICD-10-CM | POA: Diagnosis present

## 2020-09-09 DIAGNOSIS — W1830XD Fall on same level, unspecified, subsequent encounter: Secondary | ICD-10-CM | POA: Diagnosis not present

## 2020-09-09 DIAGNOSIS — R52 Pain, unspecified: Secondary | ICD-10-CM | POA: Diagnosis present

## 2020-09-09 DIAGNOSIS — M549 Dorsalgia, unspecified: Secondary | ICD-10-CM | POA: Diagnosis not present

## 2020-09-09 DIAGNOSIS — Z20822 Contact with and (suspected) exposure to covid-19: Secondary | ICD-10-CM | POA: Diagnosis not present

## 2020-09-09 DIAGNOSIS — R339 Retention of urine, unspecified: Secondary | ICD-10-CM | POA: Diagnosis not present

## 2020-09-09 DIAGNOSIS — Z6829 Body mass index (BMI) 29.0-29.9, adult: Secondary | ICD-10-CM

## 2020-09-09 DIAGNOSIS — M069 Rheumatoid arthritis, unspecified: Secondary | ICD-10-CM | POA: Diagnosis not present

## 2020-09-09 DIAGNOSIS — R2689 Other abnormalities of gait and mobility: Secondary | ICD-10-CM | POA: Diagnosis present

## 2020-09-09 DIAGNOSIS — M25561 Pain in right knee: Secondary | ICD-10-CM

## 2020-09-09 DIAGNOSIS — M5459 Other low back pain: Secondary | ICD-10-CM | POA: Diagnosis not present

## 2020-09-09 DIAGNOSIS — D72823 Leukemoid reaction: Secondary | ICD-10-CM | POA: Diagnosis not present

## 2020-09-09 DIAGNOSIS — W010XXD Fall on same level from slipping, tripping and stumbling without subsequent striking against object, subsequent encounter: Secondary | ICD-10-CM | POA: Diagnosis present

## 2020-09-09 DIAGNOSIS — Y838 Other surgical procedures as the cause of abnormal reaction of the patient, or of later complication, without mention of misadventure at the time of the procedure: Secondary | ICD-10-CM | POA: Diagnosis not present

## 2020-09-09 DIAGNOSIS — S92001A Unspecified fracture of right calcaneus, initial encounter for closed fracture: Secondary | ICD-10-CM | POA: Diagnosis not present

## 2020-09-09 DIAGNOSIS — E042 Nontoxic multinodular goiter: Secondary | ICD-10-CM | POA: Diagnosis present

## 2020-09-09 DIAGNOSIS — E1159 Type 2 diabetes mellitus with other circulatory complications: Secondary | ICD-10-CM | POA: Diagnosis present

## 2020-09-09 DIAGNOSIS — M4316 Spondylolisthesis, lumbar region: Principal | ICD-10-CM | POA: Diagnosis present

## 2020-09-09 DIAGNOSIS — Z8261 Family history of arthritis: Secondary | ICD-10-CM | POA: Diagnosis not present

## 2020-09-09 DIAGNOSIS — D649 Anemia, unspecified: Secondary | ICD-10-CM | POA: Diagnosis not present

## 2020-09-09 DIAGNOSIS — F418 Other specified anxiety disorders: Secondary | ICD-10-CM | POA: Diagnosis not present

## 2020-09-09 DIAGNOSIS — Z8249 Family history of ischemic heart disease and other diseases of the circulatory system: Secondary | ICD-10-CM

## 2020-09-09 DIAGNOSIS — S72431A Displaced fracture of medial condyle of right femur, initial encounter for closed fracture: Secondary | ICD-10-CM | POA: Diagnosis not present

## 2020-09-09 DIAGNOSIS — S72451D Displaced supracondylar fracture without intracondylar extension of lower end of right femur, subsequent encounter for closed fracture with routine healing: Secondary | ICD-10-CM | POA: Diagnosis not present

## 2020-09-09 DIAGNOSIS — Z79891 Long term (current) use of opiate analgesic: Secondary | ICD-10-CM

## 2020-09-09 DIAGNOSIS — M4726 Other spondylosis with radiculopathy, lumbar region: Secondary | ICD-10-CM | POA: Diagnosis not present

## 2020-09-09 DIAGNOSIS — R0902 Hypoxemia: Secondary | ICD-10-CM | POA: Diagnosis not present

## 2020-09-09 DIAGNOSIS — Z888 Allergy status to other drugs, medicaments and biological substances status: Secondary | ICD-10-CM

## 2020-09-09 DIAGNOSIS — Z96653 Presence of artificial knee joint, bilateral: Secondary | ICD-10-CM | POA: Diagnosis present

## 2020-09-09 DIAGNOSIS — G8918 Other acute postprocedural pain: Secondary | ICD-10-CM | POA: Diagnosis present

## 2020-09-09 DIAGNOSIS — F419 Anxiety disorder, unspecified: Secondary | ICD-10-CM | POA: Diagnosis present

## 2020-09-09 DIAGNOSIS — M79604 Pain in right leg: Secondary | ICD-10-CM | POA: Diagnosis not present

## 2020-09-09 DIAGNOSIS — M818 Other osteoporosis without current pathological fracture: Secondary | ICD-10-CM | POA: Diagnosis present

## 2020-09-09 DIAGNOSIS — R0989 Other specified symptoms and signs involving the circulatory and respiratory systems: Secondary | ICD-10-CM | POA: Diagnosis not present

## 2020-09-09 DIAGNOSIS — I152 Hypertension secondary to endocrine disorders: Secondary | ICD-10-CM | POA: Diagnosis present

## 2020-09-09 DIAGNOSIS — W19XXXA Unspecified fall, initial encounter: Secondary | ICD-10-CM | POA: Diagnosis not present

## 2020-09-09 DIAGNOSIS — M797 Fibromyalgia: Secondary | ICD-10-CM | POA: Diagnosis present

## 2020-09-09 DIAGNOSIS — M5416 Radiculopathy, lumbar region: Secondary | ICD-10-CM | POA: Diagnosis not present

## 2020-09-09 DIAGNOSIS — S92001D Unspecified fracture of right calcaneus, subsequent encounter for fracture with routine healing: Secondary | ICD-10-CM | POA: Diagnosis not present

## 2020-09-09 DIAGNOSIS — M9701XA Periprosthetic fracture around internal prosthetic right hip joint, initial encounter: Secondary | ICD-10-CM | POA: Diagnosis not present

## 2020-09-09 DIAGNOSIS — M1990 Unspecified osteoarthritis, unspecified site: Secondary | ICD-10-CM | POA: Diagnosis present

## 2020-09-09 DIAGNOSIS — T8130XA Disruption of wound, unspecified, initial encounter: Secondary | ICD-10-CM | POA: Diagnosis not present

## 2020-09-09 DIAGNOSIS — R7303 Prediabetes: Secondary | ICD-10-CM | POA: Diagnosis present

## 2020-09-09 DIAGNOSIS — M545 Low back pain, unspecified: Secondary | ICD-10-CM

## 2020-09-09 DIAGNOSIS — N390 Urinary tract infection, site not specified: Secondary | ICD-10-CM | POA: Diagnosis not present

## 2020-09-09 DIAGNOSIS — R5082 Postprocedural fever: Secondary | ICD-10-CM | POA: Diagnosis not present

## 2020-09-09 DIAGNOSIS — Z79899 Other long term (current) drug therapy: Secondary | ICD-10-CM

## 2020-09-09 DIAGNOSIS — D62 Acute posthemorrhagic anemia: Secondary | ICD-10-CM | POA: Diagnosis not present

## 2020-09-09 DIAGNOSIS — M9711XD Periprosthetic fracture around internal prosthetic right knee joint, subsequent encounter: Secondary | ICD-10-CM

## 2020-09-09 DIAGNOSIS — R600 Localized edema: Secondary | ICD-10-CM | POA: Diagnosis not present

## 2020-09-09 DIAGNOSIS — Z882 Allergy status to sulfonamides status: Secondary | ICD-10-CM

## 2020-09-09 LAB — CBC WITH DIFFERENTIAL/PLATELET
Abs Immature Granulocytes: 0.03 10*3/uL (ref 0.00–0.07)
Basophils Absolute: 0 10*3/uL (ref 0.0–0.1)
Basophils Relative: 1 %
Eosinophils Absolute: 0 10*3/uL (ref 0.0–0.5)
Eosinophils Relative: 1 %
HCT: 32.9 % — ABNORMAL LOW (ref 36.0–46.0)
Hemoglobin: 10.4 g/dL — ABNORMAL LOW (ref 12.0–15.0)
Immature Granulocytes: 1 %
Lymphocytes Relative: 8 %
Lymphs Abs: 0.5 10*3/uL — ABNORMAL LOW (ref 0.7–4.0)
MCH: 29.9 pg (ref 26.0–34.0)
MCHC: 31.6 g/dL (ref 30.0–36.0)
MCV: 94.5 fL (ref 80.0–100.0)
Monocytes Absolute: 0.3 10*3/uL (ref 0.1–1.0)
Monocytes Relative: 6 %
Neutro Abs: 5.3 10*3/uL (ref 1.7–7.7)
Neutrophils Relative %: 83 %
Platelets: 394 10*3/uL (ref 150–400)
RBC: 3.48 MIL/uL — ABNORMAL LOW (ref 3.87–5.11)
RDW: 13.9 % (ref 11.5–15.5)
WBC: 6.2 10*3/uL (ref 4.0–10.5)
nRBC: 0 % (ref 0.0–0.2)

## 2020-09-09 LAB — BASIC METABOLIC PANEL
Anion gap: 9 (ref 5–15)
BUN: 25 mg/dL — ABNORMAL HIGH (ref 8–23)
CO2: 25 mmol/L (ref 22–32)
Calcium: 9.3 mg/dL (ref 8.9–10.3)
Chloride: 105 mmol/L (ref 98–111)
Creatinine, Ser: 0.58 mg/dL (ref 0.44–1.00)
GFR, Estimated: >60 mL/min (ref >60)
Glucose, Bld: 126 mg/dL — ABNORMAL HIGH (ref 70–99)
Potassium: 3.6 mmol/L (ref 3.5–5.1)
Sodium: 139 mmol/L (ref 135–145)

## 2020-09-09 LAB — RESP PANEL BY RT-PCR (FLU A&B, COVID) ARPGX2
Influenza A by PCR: NEGATIVE
Influenza B by PCR: NEGATIVE
SARS Coronavirus 2 by RT PCR: NEGATIVE

## 2020-09-09 MED ORDER — LIDOCAINE 5 % EX PTCH
1.0000 | MEDICATED_PATCH | CUTANEOUS | Status: DC
  Administered 2020-09-10 – 2020-09-15 (×5): 1 via TRANSDERMAL
  Filled 2020-09-09 (×6): qty 1

## 2020-09-09 MED ORDER — MORPHINE SULFATE (PF) 2 MG/ML IV SOLN: 2.0000 mg | INTRAVENOUS | Status: DC | PRN

## 2020-09-09 MED ORDER — HYDROMORPHONE HCL 1 MG/ML IJ SOLN
1.0000 mg | Freq: Once | INTRAMUSCULAR | Status: AC
Start: 2020-09-09 — End: 2020-09-09
  Administered 2020-09-09: 1 mg via INTRAVENOUS
  Filled 2020-09-09: qty 1

## 2020-09-09 MED ORDER — HYDROMORPHONE HCL 1 MG/ML IJ SOLN
1.0000 mg | Freq: Once | INTRAMUSCULAR | Status: AC
  Administered 2020-09-09: 1 mg via INTRAMUSCULAR
  Filled 2020-09-09: qty 1

## 2020-09-09 MED ORDER — MORPHINE SULFATE (PF) 2 MG/ML IV SOLN
2.0000 mg | INTRAVENOUS | Status: DC | PRN
Start: 2020-09-09 — End: 2020-09-17
  Administered 2020-09-10 – 2020-09-15 (×11): 2 mg via INTRAVENOUS
  Filled 2020-09-09 (×11): qty 1

## 2020-09-09 MED ORDER — HYDROMORPHONE HCL 1 MG/ML IJ SOLN
1.0000 mg | Freq: Once | INTRAMUSCULAR | Status: AC
  Administered 2020-09-09: 1 mg via INTRAVENOUS
  Filled 2020-09-09: qty 1

## 2020-09-09 MED ORDER — OXYCODONE-ACETAMINOPHEN 5-325 MG PO TABS
2.0000 | ORAL_TABLET | ORAL | Status: DC | PRN
  Administered 2020-09-10 – 2020-09-15 (×19): 2 via ORAL
  Filled 2020-09-09 (×19): qty 2

## 2020-09-09 MED ORDER — HYDROCODONE-ACETAMINOPHEN 5-325 MG PO TABS: 1.0000 | ORAL_TABLET | ORAL | Status: DC | PRN

## 2020-09-09 MED ORDER — PREDNISONE 20 MG PO TABS
20.0000 mg | ORAL_TABLET | Freq: Once | ORAL | Status: AC
  Administered 2020-09-09: 20 mg via ORAL
  Filled 2020-09-09: qty 1

## 2020-09-09 MED ORDER — OXYCODONE-ACETAMINOPHEN 5-325 MG PO TABS: 1.0000 | ORAL_TABLET | Freq: Four times a day (QID) | ORAL | 0 refills | Status: DC | PRN

## 2020-09-09 MED ORDER — ENOXAPARIN SODIUM 40 MG/0.4ML IJ SOSY
40.0000 mg | PREFILLED_SYRINGE | INTRAMUSCULAR | Status: DC
  Administered 2020-09-10 – 2020-09-13 (×4): 40 mg via SUBCUTANEOUS
  Filled 2020-09-09 (×4): qty 0.4

## 2020-09-09 NOTE — H&P (Signed)
History and Physical    Haley Roberts [id number] DOB: 10/25/1945 DOA: 09/09/2020  PCP: Binnie Rail, MD  Patient coming from: Home  I have personally briefly reviewed patient's old medical records in [place]  Chief Complaint: Worsening lower back pain  HPI: Haley Roberts is a 75 y.o. female with medical history significant for chronic back pain on chronic opioid, rheumatoid arthritis on chronic prednisone, fibromyalgia, and hypertension who presents with concerns of worsening lower back pain.  Patient reports that for the past several months she has had gradual worsening of her lower back pain.  This became more unbearable in the past week.  She also recently was evaluated in the ED on 8/21 for a fall and was found to have nondisplaced Henrene Pastor prosthetic fracture of the right medial femoral condyle and right foot avulsion fracture of the anterior calcaneus.  She has not yet been able to see orthopedic outpatient yet.  States the pain is in her lower lumbar area and radiates to bilateral buttocks.  Had some numbness and tingling before but now it is mostly pain.  She normally takes 10 mg Norco prescribed by her rheumatologist for pain number feels it is less and less effective.  Denies any saddle anesthesia.  No urinary or bowel incontinence.  She had planned appointment with Dr. Vertell Limber of neurosurgery and spine today but was not able to make it to her appointment due to increased pain.  ED Course: She was afebrile, normotensive on room air in a hallway bed.  CBC negative for leukocytosis.  Has chronic anemia 10.4.  BMP otherwise unremarkable.  MRI of the lumbar spine showing facet arthropathy and degenerative anterior listhesis of multiple levels. ED physician did discuss with on-call neurosurgery who just recommends her to follow-up outpatient.  She was given several doses of '1mg'$  Dilaudid and 1 dose of increased prednisone but continued to have intractable back pain so  hospitalist was called for pain management.  Review of Systems: Constitutional: No Weight Change, No Fever ENT/Mouth: No sore throat, No Rhinorrhea Eyes: No Eye Pain, No Vision Changes Cardiovascular: No Chest Pain, no SOB, + Edema Respiratory: No Cough, Gastrointestinal: No Nausea, No Vomiting, No Diarrhea, No Constipation, No Pain Genitourinary: no Urinary Incontinence Musculoskeletal: No Arthralgias, No Myalgias Skin: No Skin Lesions, No Pruritus, Neuro: no Weakness, No Numbness Psych: No Anxiety/Panic, No Depression, no decrease appetite Heme/Lymph: No Bruising, No Bleeding  Past Medical History:  Diagnosis Date   Anemia    Anxiety    Depression    Fibromyalgia    Hyperglycemia    HYPERGLYCEMIA, FASTING 08/22/2007   Hypertension    Multiple thyroid nodules    gets ultra sound yearly on thryoid   Rheumatoid arthritis(714.0)     Past Surgical History:  Procedure Laterality Date   ABDOMINAL HYSTERECTOMY     with BSO   BREAST BIOPSY     JOINT REPLACEMENT     right knee replacement 2012   TOTAL KNEE ARTHROPLASTY Left 07/07/2014   Procedure: LEFT TOTAL KNEE ARTHROPLASTY;  Surgeon: Paralee Cancel, MD;  Location: [facility];  Service: Orthopedics;  Laterality: Left;     reports that she has never smoked. She has never used smokeless tobacco. She reports that she does not drink alcohol and does not use drugs. Social History  Allergies  Allergen Reactions   Infliximab Shortness Of Breath, Other (See Comments) and Hypertension    Remicade   Sulfa Antibiotics Other (See Comments)    Convulsions  Sulfacetamide Sodium-Sulfur Other (See Comments)    Convulsions   Sulfonamide Derivatives Other (See Comments)    BAD HEADACHES/CHILLS/FEVER and CONVULSIONS with Sulfa antibiotics and Sulfacetamide Sodium-Sulfur    Family History  Problem Relation Age of Onset   Cancer Mother 37       breast   Arthritis Father    Hypertension Father    Diabetes Sister    Hypertension Brother     Diabetes Son    Depression Son      Prior to Admission medications   Medication Sig Start Date End Date Taking? Authorizing Provider  buPROPion (WELLBUTRIN XL) 300 MG 24 hr tablet Take 300 mg by mouth daily.   Yes [provider]  Calcium Carbonate-Vit D-Min (CALTRATE PLUS PO) Take 1 tablet by mouth in the morning.   Yes [provider]  escitalopram (LEXAPRO) 20 MG tablet Take 20 mg by mouth daily.   Yes [provider]  hydrochlorothiazide (HYDRODIURIL) 12.5 MG tablet Take 1 tablet (12.5 mg total) by mouth daily. 11/18/19  Yes Burns, Claudina Lick, MD  HYDROcodone-acetaminophen (NORCO) 10-325 MG tablet Take 1 tablet by mouth 3 (three) times daily as needed (for pain). 10/22/19  Yes [provider]  leflunomide (ARAVA) 10 MG tablet Take 10 mg by mouth daily. 11/18/19  Yes [provider]  losartan (COZAAR) 100 MG tablet Take 1 tablet (100 mg total) by mouth daily. 11/18/19  Yes Burns, Claudina Lick, MD  methocarbamol (ROBAXIN) 500 MG tablet Take 1 tablet (500 mg total) by mouth every 6 (six) hours as needed for muscle spasms. 07/08/14  Yes Babish, Rodman Key, PA-C  Multiple Vitamins-Minerals (WOMENS DAILY FORMULA) TABS Take 1 tablet by mouth daily.     Yes [provider]  predniSONE (DELTASONE) 5 MG tablet Take 5 mg by mouth daily.   Yes [provider]  vitamin C (ASCORBIC ACID) 500 MG tablet Take 500 mg by mouth daily.     Yes [provider]  vitamin E 400 UNIT capsule Take 400 Units by mouth daily.     Yes [provider]  benzonatate (TESSALON PERLES) 100 MG capsule Take 1 capsule (100 mg total) by mouth 3 (three) times daily as needed. Patient not taking: No sig reported 08/12/20   Lucretia Kern, DO    Physical Exam: Vitals:   09/09/20 1852 09/09/20 1924 09/09/20 2033  BP:  (!) 159/83 133/79  Pulse:  90 95  Resp:  18 18  Temp:  98.1 F (36.7 C)   TempSrc:  Oral   SpO2: 97% 95% 94%    Constitutional: NAD, calm,  comfortable, elderly female laying flat in bed Vitals:   09/09/20 1852 09/09/20 1924 09/09/20 2033  BP:  (!) 159/83 133/79  Pulse:  90 95  Resp:  18 18  Temp:  98.1 F (36.7 C)   TempSrc:  Oral   SpO2: 97% 95% 94%   Eyes: PERRL, lids and conjunctivae normal ENMT: Mucous membranes are moist.  Neck: normal, supple Respiratory: clear to auscultation bilaterally, no wheezing, no crackles. Normal respiratory effort. No accessory muscle use.  Cardiovascular: Regular rate and rhythm, no murmurs / rubs / gallops.  +2 pitting edema of the right lower extremity.  2+ pedal pulses. Abdomen: no tenderness, no masses palpated.  Bowel sounds positive.  Musculoskeletal: no clubbing / cyanosis. No joint deformity upper and lower extremities.  Only able to lift bilateral lower extremity minimally due to intolerance to back pain and right knee pain. Skin: no  rashes, lesions, ulcers. No induration Neurologic: CN 2-12 grossly intact. Sensation intact. Psychiatric: Normal judgment and insight. Alert and oriented x 3. Normal mood.     Labs on Admission: I have personally reviewed following labs and imaging studies  CBC: Recent Labs  Lab 09/09/20 2248  WBC 6.2  NEUTROABS 5.3  HGB 10.4*  HCT 32.9*  MCV 94.5  PLT [id number]   Basic Metabolic Panel: Recent Labs  Lab 09/09/20 2248  NA 139  K 3.6  CL 105  CO2 25  GLUCOSE 126*  BUN 25*  CREATININE 0.58  CALCIUM 9.3   GFR: CrCl cannot be calculated (Unknown ideal weight.). Liver Function Tests: No results for input(s): AST, ALT, ALKPHOS, BILITOT, PROT, ALBUMIN in the last 168 hours. No results for input(s): LIPASE, AMYLASE in the last 168 hours. No results for input(s): AMMONIA in the last 168 hours. Coagulation Profile: No results for input(s): INR, PROTIME in the last 168 hours. Cardiac Enzymes: No results for input(s): CKTOTAL, CKMB, CKMBINDEX, TROPONINI in the last 168 hours. BNP (last 3 results) No results for input(s): PROBNP in the last  8760 hours. HbA1C: No results for input(s): HGBA1C in the last 72 hours. CBG: No results for input(s): GLUCAP in the last 168 hours. Lipid Profile: No results for input(s): CHOL, HDL, LDLCALC, TRIG, CHOLHDL, LDLDIRECT in the last 72 hours. Thyroid Function Tests: No results for input(s): TSH, T4TOTAL, FREET4, T3FREE, THYROIDAB in the last 72 hours. Anemia Panel: No results for input(s): VITAMINB12, FOLATE, FERRITIN, TIBC, IRON, RETICCTPCT in the last 72 hours. Urine analysis:    Component Value Date/Time   COLORURINE YELLOW 07/01/2014 1435   APPEARANCEUR CLEAR 07/01/2014 1435   LABSPEC 1.008 07/01/2014 1435   PHURINE 7.5 07/01/2014 1435   GLUCOSEU NEGATIVE 07/01/2014 1435   HGBUR NEGATIVE 07/01/2014 1435   BILIRUBINUR neg 12/24/2019 1451   KETONESUR NEGATIVE 07/01/2014 1435   PROTEINUR Negative 12/24/2019 1451   PROTEINUR NEGATIVE 07/01/2014 1435   UROBILINOGEN 0.2 12/24/2019 1451   UROBILINOGEN 0.2 07/01/2014 1435   NITRITE neg 12/24/2019 1451   NITRITE NEGATIVE 07/01/2014 1435   LEUKOCYTESUR Negative 12/24/2019 1451    Radiological Exams on Admission: DG Pelvis 1-2 Views  Result Date: 09/09/2020 CLINICAL DATA:  Fall on 09/05/2020 with known femur fracture and pelvic pain, initial encounter EXAM: PELVIS - 1 VIEW COMPARISON:  None. FINDINGS: Pelvic ring is intact. Degenerative changes of lumbar spine and hip joints are seen. No acute fracture or dislocation is noted. IMPRESSION: Degenerative change without acute abnormality. Electronically Signed   By: Inez Catalina M.D.   On: 09/09/2020 23:09   MR LUMBAR SPINE WO CONTRAST  Result Date: 09/09/2020 CLINICAL DATA:  Low back pain for greater than 6 weeks. Recent fall. EXAM: MRI LUMBAR SPINE WITHOUT CONTRAST TECHNIQUE: Multiplanar, multisequence MR imaging of the lumbar spine was performed. No intravenous contrast was administered. COMPARISON:  Radiography 06/28/2020 FINDINGS: Segmentation:  5 lumbar type vertebral bodies. Alignment:  3 mm degenerative anterolisthesis L3-4. 9 mm degenerative anterolisthesis L4-5. 5 mm degenerative anterolisthesis L5-S1. one or 2 mm of retrolisthesis at T12-L1, L1-2 and L2-3. Vertebrae:  No fracture or focal bone lesion. Conus medullaris and cauda equina: Conus extends to the L1 level. Conus and cauda equina appear normal. Paraspinal and other soft tissues: Negative Disc levels: Mild bulging of the discs at T11-12, T12-L1, L1-2 and L2-3. No compressive stenosis of the canal or foramina. L3-4: Facet arthropathy with 3 mm of anterolisthesis. Bulging of the disc. Stenosis of the lateral recesses and  neural foramina that could possibly be symptomatic. Facet arthropathy would likely contribute to back pain. L4-5: Facet arthropathy with 9 mm of anterolisthesis. Pseudo disc herniation. Severe spinal stenosis likely to cause neural compression on both sides. L5-S1: Facet arthropathy with 5 mm of anterolisthesis. Pseudo disc herniation. Stenosis of the subarticular lateral recesses and of the intervertebral foramen on the left. Neural compression is possible at this level, particularly affecting the left L5 nerve. IMPRESSION: L4-5: Advanced facet arthropathy with degenerative anterolisthesis of 9 mm. Severe multifactorial spinal stenosis which could cause neural compression in the central canal as well as in both neural foramina. L5-S1: Facet arthropathy with degenerative anterolisthesis of 5 mm. Narrowing of the subarticular lateral recesses and of the intervertebral foramen on the left. Left L5 nerve could be compressed. L3-4: Facet arthropathy with 3 mm of anterolisthesis. Mild stenosis of the lateral recesses and neural foramina could possibly be symptomatic. Obviously, the facet arthropathy at these levels could contribute to low back pain. Electronically Signed   By: Nelson Chimes M.D.   On: 09/09/2020 20:26      Assessment/Plan  Acute on chronic lumbar back pain secondary to facet arthropathy and degenerative  anteriolithesis of multiple levels -PRN pain control with oyxcodone '10mg'$  q4hr for moderate pain and IV morphine '2mg'$  q3hr for severe pain - Give K pad and Lidocaine patch - increase predisone to '20mg'$  from '5mg'$  and can d/c with taper  - PT eval - advise follow up with neurosurgery outpatient  Rheumatoid arthritis/fibromyalgia - On chronic prednisone.  Continue increased for acute low back pain as above -Continue leflunomide  Hypertension -continue HCTZ and losartan  Depression - Continue bupropion and Lexapro  History of nondisplaced right periprosthetic fracture of the medial femoral condyle Right foot avulsion fracture of the anterior calcaneus -Status post fall and diagnosed in ED on 8/21 - Continue outpatient follow-up with orthopedic  DVT prophylaxis:.Lovenox Code Status: Full Family Communication: Plan discussed with patient and son at bedside  disposition Plan: Home with observation Consults called:  Admission status: Observation  Level of care: Med-Surg  Status is: Observation  The patient remains OBS appropriate and will d/c before 2 midnights.   Dispo: The patient is from: Home              Anticipated d/c is to: Home              Patient currently is not medically stable to d/c.   Difficult to place patient No         Orene Desanctis DO [facility]   If 7PM-7AM, please contact night-coverage [email or web address]   09/09/2020, 11:48 PM

## 2020-09-09 NOTE — ED Notes (Signed)
Patient transported to MRI 

## 2020-09-09 NOTE — ED Triage Notes (Signed)
Pt BIB EMS from home c/o lower right back/hip pain resulting from recent fall. Pt was seen at [facility] on 8/21 for fall and dx with R foot and femur fracture. Reports pt has been sedentary since fall and causing her pain to worsen.

## 2020-09-09 NOTE — ED Provider Notes (Addendum)
Haley Roberts Provider Note   CSN: [id number] Arrival date & time: 09/09/20  1845     History Chief Complaint  Patient presents with   Back Pain   Hip Pain    Haley Roberts is a 75 y.o. female.  75 year old female with prior medical history as detailed below presents for evaluation.  Patient with longstanding history of chronic low back pain.  Patient reports increased low back pain over the last several weeks.  Patient reports that her pain was not controlled today so she missed an appointment with Dr. Vertell Limber of neurosurgery and spine.  Patient presents to the ED this evening complaining of continued pain despite use of hydrocodone.  She denies any new change to the pain.  She denies weakness into her lower extremities.  She denies difficulty with her bowel movements.  She denies urinary difficulty or urinary symptoms.  She denies fever.  The history is provided by the patient.  Back Pain Location:  Lumbar spine Quality:  Aching Radiates to:  Does not radiate Pain severity:  Moderate Pain is:  Worse during the night Onset quality:  Gradual Duration:  6 weeks Timing:  Constant Progression:  Worsening Hip Pain      Past Medical History:  Diagnosis Date   Anemia    Anxiety    Depression    Fibromyalgia    Hyperglycemia    HYPERGLYCEMIA, FASTING 08/22/2007   Hypertension    Multiple thyroid nodules    gets ultra sound yearly on thryoid   Rheumatoid arthritis(714.0)     Patient Active Problem List   Diagnosis Date Noted   Difficulty urinating 11/18/2019   Bilateral leg edema 05/03/2018   Prediabetes 12/27/2017   Fibromyalgia 05/28/2015   S/P left TKA 07/07/2014   S/P knee replacement 07/07/2014   Right knee DJD 08/29/2010   Rheumatoid arthritis ([place]) 03/30/2010   Osteoarthritis 01/12/2009   Non-toxic nodular goiter 08/22/2007   Essential hypertension 08/22/2007   ANEMIA-NOS 05/31/2006   Depression 05/31/2006   ABNORMAL  THYROID FUNCTION TESTS 05/31/2006    Past Surgical History:  Procedure Laterality Date   ABDOMINAL HYSTERECTOMY     with BSO   BREAST BIOPSY     JOINT REPLACEMENT     right knee replacement 2012   TOTAL KNEE ARTHROPLASTY Left 07/07/2014   Procedure: LEFT TOTAL KNEE ARTHROPLASTY;  Surgeon: Paralee Cancel, MD;  Location: [facility];  Service: Orthopedics;  Laterality: Left;     OB History     Gravida  3   Para  3   Term      Preterm      AB      Living  3      SAB      IAB      Ectopic      Multiple      Live Births              Family History  Problem Relation Age of Onset   Cancer Mother 3       breast   Arthritis Father    Hypertension Father    Diabetes Sister    Hypertension Brother    Diabetes Son    Depression Son     Social History   Tobacco Use   Smoking status: Never   Smokeless tobacco: Never  Vaping Use   Vaping Use: Never used  Substance Use Topics   Alcohol use: No   Drug use: No  Home Medications Prior to Admission medications   Medication Sig Start Date End Date Taking? Authorizing Provider  buPROPion (WELLBUTRIN XL) 300 MG 24 hr tablet Take 300 mg by mouth daily.   Yes [provider]  Calcium Carbonate-Vit D-Min (CALTRATE PLUS PO) Take 1 tablet by mouth in the morning.   Yes [provider]  escitalopram (LEXAPRO) 20 MG tablet Take 20 mg by mouth daily.   Yes [provider]  hydrochlorothiazide (HYDRODIURIL) 12.5 MG tablet Take 1 tablet (12.5 mg total) by mouth daily. 11/18/19  Yes Burns, Claudina Lick, MD  HYDROcodone-acetaminophen (NORCO) 10-325 MG tablet Take 1 tablet by mouth 3 (three) times daily as needed (for pain). 10/22/19  Yes [provider]  leflunomide (ARAVA) 10 MG tablet Take 10 mg by mouth daily. 11/18/19  Yes [provider]  losartan (COZAAR) 100 MG tablet Take 1 tablet (100 mg total) by mouth daily. 11/18/19  Yes Burns, Claudina Lick, MD  methocarbamol (ROBAXIN) 500 MG tablet  Take 1 tablet (500 mg total) by mouth every 6 (six) hours as needed for muscle spasms. 07/08/14  Yes Babish, Rodman Key, PA-C  Multiple Vitamins-Minerals (WOMENS DAILY FORMULA) TABS Take 1 tablet by mouth daily.     Yes [provider]  oxyCODONE-acetaminophen (PERCOCET/ROXICET) 5-325 MG tablet Take 1 tablet by mouth every 6 (six) hours as needed for severe pain. 09/09/20  Yes Valarie Merino, MD  predniSONE (DELTASONE) 5 MG tablet Take 5 mg by mouth daily.   Yes [provider]  vitamin C (ASCORBIC ACID) 500 MG tablet Take 500 mg by mouth daily.     Yes [provider]  vitamin E 400 UNIT capsule Take 400 Units by mouth daily.     Yes [provider]  benzonatate (TESSALON PERLES) 100 MG capsule Take 1 capsule (100 mg total) by mouth 3 (three) times daily as needed. Patient not taking: No sig reported 08/12/20   Lucretia Kern, DO    Allergies    Infliximab, Sulfa antibiotics, Sulfacetamide sodium-sulfur, and Sulfonamide derivatives  Review of Systems   Review of Systems  Musculoskeletal:  Positive for back pain.  All other systems reviewed and are negative.  Physical Exam Updated Vital Signs BP 133/79 (BP Location: Right Arm)   Pulse 95   Temp 98.1 F (36.7 C) (Oral)   Resp 18   SpO2 94%   Physical Exam Vitals and nursing note reviewed.  Constitutional:      General: She is not in acute distress.    Appearance: Normal appearance. She is well-developed.  HENT:     Head: Normocephalic and atraumatic.  Eyes:     Conjunctiva/sclera: Conjunctivae normal.     Pupils: Pupils are equal, round, and reactive to light.  Cardiovascular:     Rate and Rhythm: Normal rate and regular rhythm.     Heart sounds: Normal heart sounds.  Pulmonary:     Effort: Pulmonary effort is normal. No respiratory distress.     Breath sounds: Normal breath sounds.  Abdominal:     General: There is no distension.     Palpations: Abdomen is soft.     Tenderness: There is no  abdominal tenderness.  Musculoskeletal:        General: No deformity. Normal range of motion.     Cervical back: Normal range of motion and neck supple.     Comments: Diffuse lumbar back pain - no significant midline tenderness   BLE with 5/5 strength  Skin:  General: Skin is warm and dry.  Neurological:     General: No focal deficit present.     Mental Status: She is alert and oriented to person, place, and time.    ED Results / Procedures / Treatments   Labs (all labs ordered are listed, but only abnormal results are displayed) Labs Reviewed - No data to display  EKG None  Radiology MR LUMBAR SPINE WO CONTRAST  Result Date: 09/09/2020 CLINICAL DATA:  Low back pain for greater than 6 weeks. Recent fall. EXAM: MRI LUMBAR SPINE WITHOUT CONTRAST TECHNIQUE: Multiplanar, multisequence MR imaging of the lumbar spine was performed. No intravenous contrast was administered. COMPARISON:  Radiography 06/28/2020 FINDINGS: Segmentation:  5 lumbar type vertebral bodies. Alignment: 3 mm degenerative anterolisthesis L3-4. 9 mm degenerative anterolisthesis L4-5. 5 mm degenerative anterolisthesis L5-S1. one or 2 mm of retrolisthesis at T12-L1, L1-2 and L2-3. Vertebrae:  No fracture or focal bone lesion. Conus medullaris and cauda equina: Conus extends to the L1 level. Conus and cauda equina appear normal. Paraspinal and other soft tissues: Negative Disc levels: Mild bulging of the discs at T11-12, T12-L1, L1-2 and L2-3. No compressive stenosis of the canal or foramina. L3-4: Facet arthropathy with 3 mm of anterolisthesis. Bulging of the disc. Stenosis of the lateral recesses and neural foramina that could possibly be symptomatic. Facet arthropathy would likely contribute to back pain. L4-5: Facet arthropathy with 9 mm of anterolisthesis. Pseudo disc herniation. Severe spinal stenosis likely to cause neural compression on both sides. L5-S1: Facet arthropathy with 5 mm of anterolisthesis. Pseudo disc  herniation. Stenosis of the subarticular lateral recesses and of the intervertebral foramen on the left. Neural compression is possible at this level, particularly affecting the left L5 nerve. IMPRESSION: L4-5: Advanced facet arthropathy with degenerative anterolisthesis of 9 mm. Severe multifactorial spinal stenosis which could cause neural compression in the central canal as well as in both neural foramina. L5-S1: Facet arthropathy with degenerative anterolisthesis of 5 mm. Narrowing of the subarticular lateral recesses and of the intervertebral foramen on the left. Left L5 nerve could be compressed. L3-4: Facet arthropathy with 3 mm of anterolisthesis. Mild stenosis of the lateral recesses and neural foramina could possibly be symptomatic. Obviously, the facet arthropathy at these levels could contribute to low back pain. Electronically Signed   By: Nelson Chimes M.D.   On: 09/09/2020 20:26    Procedures Procedures   Medications Ordered in ED Medications  HYDROmorphone (DILAUDID) injection 1 mg (1 mg Intravenous Given 09/09/20 1919)  predniSONE (DELTASONE) tablet 20 mg (20 mg Oral Given 09/09/20 1919)  HYDROmorphone (DILAUDID) injection 1 mg (1 mg Intravenous Given 09/09/20 2027)    ED Course  I have reviewed the triage vital signs and the nursing notes.  Pertinent labs & imaging results that were available during my care of the patient were reviewed by me and considered in my medical decision making (see chart for details).    MDM Rules/Calculators/A&P                           MDM  MSE complete  MEYLIN VISONE was evaluated in Emergency Department on 09/09/2020 for the symptoms described in the history of present illness. She was evaluated in the context of the global COVID-19 pandemic, which necessitated consideration that the patient might be at risk for infection with the SARS-CoV-2 virus that causes COVID-19. Institutional protocols and algorithms that pertain to the evaluation of  patients at risk  for COVID-19 are in a state of rapid change based on information released by regulatory bodies including the CDC and federal and state organizations. These policies and algorithms were followed during the patient's care in the ED.  Patient is presenting with complaint of significant chronic low back discomfort and pain.  Patient is describing chronic symptoms -- although worsened over the last 48 to 72 hours.  Patient without evidence clinically of significant acute spine pathology.  MRI obtained of the L-spine does not show significant acute pathology.   Patient was seen on 8/21 after a fall.  She was diagnosed with a right nondisplaced periprosthetic fracture of the medial femoral condyle.  She was referred to outpatient follow-up with [facility].  She has not yet established this follow-up.    Prior to her recent fall she was able to ambulate with difficulty with a walker.  She reports that she has not been able to leave her bed for the last 48 hours. She has a wheelchair at home - but reports that she has been unable to transfer.  Case discussed briefly with [facility] (Dr. Marcello Moores). No indications for acute neurosurgical intervention.  She is a significant fall risk.  She is not stable with attempted standing.   She would likely benefit from admission for further work-up and pain control.  Hospitalist service is aware of case and will evaluate for same.       Final Clinical Impression(s) / ED Diagnoses Final diagnoses:  Chronic bilateral low back pain without sciatica    Rx / DC Orders ED Discharge Orders          Ordered    oxyCODONE-acetaminophen (PERCOCET/ROXICET) 5-325 MG tablet  Every 6 hours PRN        09/09/20 2116             Valarie Merino, MD 09/09/20 2156    Valarie Merino, MD 09/09/20 2249

## 2020-09-09 NOTE — Discharge Instructions (Addendum)
Return for any problem.  Increase your prednisone for the next 4 days (take 15 mg daily for the next 4 days) and then return to your baseline of '5mg'$  daily.  Take oxycodone OR hydrocodone for pain. Do not take both together.

## 2020-09-10 ENCOUNTER — Other Ambulatory Visit: Payer: Self-pay

## 2020-09-10 ENCOUNTER — Inpatient Hospital Stay (HOSPITAL_COMMUNITY): Payer: Medicare Other

## 2020-09-10 ENCOUNTER — Telehealth: Payer: Self-pay | Admitting: Internal Medicine

## 2020-09-10 ENCOUNTER — Encounter (HOSPITAL_COMMUNITY): Payer: Self-pay | Admitting: Family Medicine

## 2020-09-10 DIAGNOSIS — D72823 Leukemoid reaction: Secondary | ICD-10-CM | POA: Diagnosis not present

## 2020-09-10 DIAGNOSIS — R339 Retention of urine, unspecified: Secondary | ICD-10-CM | POA: Diagnosis present

## 2020-09-10 DIAGNOSIS — E042 Nontoxic multinodular goiter: Secondary | ICD-10-CM | POA: Diagnosis present

## 2020-09-10 DIAGNOSIS — Z8261 Family history of arthritis: Secondary | ICD-10-CM | POA: Diagnosis not present

## 2020-09-10 DIAGNOSIS — S92001A Unspecified fracture of right calcaneus, initial encounter for closed fracture: Secondary | ICD-10-CM | POA: Diagnosis not present

## 2020-09-10 DIAGNOSIS — M9701XA Periprosthetic fracture around internal prosthetic right hip joint, initial encounter: Secondary | ICD-10-CM | POA: Diagnosis not present

## 2020-09-10 DIAGNOSIS — S72451D Displaced supracondylar fracture without intracondylar extension of lower end of right femur, subsequent encounter for closed fracture with routine healing: Secondary | ICD-10-CM | POA: Diagnosis not present

## 2020-09-10 DIAGNOSIS — F419 Anxiety disorder, unspecified: Secondary | ICD-10-CM | POA: Diagnosis present

## 2020-09-10 DIAGNOSIS — M79604 Pain in right leg: Secondary | ICD-10-CM | POA: Diagnosis not present

## 2020-09-10 DIAGNOSIS — D649 Anemia, unspecified: Secondary | ICD-10-CM | POA: Diagnosis present

## 2020-09-10 DIAGNOSIS — G8918 Other acute postprocedural pain: Secondary | ICD-10-CM | POA: Diagnosis present

## 2020-09-10 DIAGNOSIS — R5082 Postprocedural fever: Secondary | ICD-10-CM | POA: Diagnosis not present

## 2020-09-10 DIAGNOSIS — F3289 Other specified depressive episodes: Secondary | ICD-10-CM | POA: Diagnosis not present

## 2020-09-10 DIAGNOSIS — M4316 Spondylolisthesis, lumbar region: Secondary | ICD-10-CM | POA: Diagnosis present

## 2020-09-10 DIAGNOSIS — Z90710 Acquired absence of both cervix and uterus: Secondary | ICD-10-CM | POA: Diagnosis not present

## 2020-09-10 DIAGNOSIS — M818 Other osteoporosis without current pathological fracture: Secondary | ICD-10-CM | POA: Diagnosis present

## 2020-09-10 DIAGNOSIS — M9711XD Periprosthetic fracture around internal prosthetic right knee joint, subsequent encounter: Secondary | ICD-10-CM | POA: Diagnosis not present

## 2020-09-10 DIAGNOSIS — M25461 Effusion, right knee: Secondary | ICD-10-CM | POA: Diagnosis not present

## 2020-09-10 DIAGNOSIS — R739 Hyperglycemia, unspecified: Secondary | ICD-10-CM | POA: Diagnosis not present

## 2020-09-10 DIAGNOSIS — S92001D Unspecified fracture of right calcaneus, subsequent encounter for fracture with routine healing: Secondary | ICD-10-CM | POA: Diagnosis not present

## 2020-09-10 DIAGNOSIS — R7303 Prediabetes: Secondary | ICD-10-CM | POA: Diagnosis present

## 2020-09-10 DIAGNOSIS — M4726 Other spondylosis with radiculopathy, lumbar region: Secondary | ICD-10-CM | POA: Diagnosis present

## 2020-09-10 DIAGNOSIS — R52 Pain, unspecified: Secondary | ICD-10-CM | POA: Diagnosis present

## 2020-09-10 DIAGNOSIS — Z7952 Long term (current) use of systemic steroids: Secondary | ICD-10-CM | POA: Diagnosis not present

## 2020-09-10 DIAGNOSIS — S72434D Nondisplaced fracture of medial condyle of right femur, subsequent encounter for closed fracture with routine healing: Secondary | ICD-10-CM | POA: Diagnosis not present

## 2020-09-10 DIAGNOSIS — R0989 Other specified symptoms and signs involving the circulatory and respiratory systems: Secondary | ICD-10-CM | POA: Diagnosis not present

## 2020-09-10 DIAGNOSIS — M797 Fibromyalgia: Secondary | ICD-10-CM | POA: Diagnosis present

## 2020-09-10 DIAGNOSIS — D72829 Elevated white blood cell count, unspecified: Secondary | ICD-10-CM | POA: Diagnosis present

## 2020-09-10 DIAGNOSIS — G8929 Other chronic pain: Secondary | ICD-10-CM | POA: Diagnosis present

## 2020-09-10 DIAGNOSIS — W010XXD Fall on same level from slipping, tripping and stumbling without subsequent striking against object, subsequent encounter: Secondary | ICD-10-CM | POA: Diagnosis present

## 2020-09-10 DIAGNOSIS — E871 Hypo-osmolality and hyponatremia: Secondary | ICD-10-CM | POA: Diagnosis present

## 2020-09-10 DIAGNOSIS — M5459 Other low back pain: Secondary | ICD-10-CM | POA: Diagnosis not present

## 2020-09-10 DIAGNOSIS — Z6829 Body mass index (BMI) 29.0-29.9, adult: Secondary | ICD-10-CM | POA: Diagnosis not present

## 2020-09-10 DIAGNOSIS — T380X5A Adverse effect of glucocorticoids and synthetic analogues, initial encounter: Secondary | ICD-10-CM | POA: Diagnosis present

## 2020-09-10 DIAGNOSIS — K5900 Constipation, unspecified: Secondary | ICD-10-CM | POA: Diagnosis present

## 2020-09-10 DIAGNOSIS — S72431A Displaced fracture of medial condyle of right femur, initial encounter for closed fracture: Secondary | ICD-10-CM | POA: Diagnosis not present

## 2020-09-10 DIAGNOSIS — Y838 Other surgical procedures as the cause of abnormal reaction of the patient, or of later complication, without mention of misadventure at the time of the procedure: Secondary | ICD-10-CM | POA: Diagnosis not present

## 2020-09-10 DIAGNOSIS — M5416 Radiculopathy, lumbar region: Secondary | ICD-10-CM | POA: Diagnosis not present

## 2020-09-10 DIAGNOSIS — M069 Rheumatoid arthritis, unspecified: Secondary | ICD-10-CM | POA: Diagnosis present

## 2020-09-10 DIAGNOSIS — F32A Depression, unspecified: Secondary | ICD-10-CM | POA: Diagnosis present

## 2020-09-10 DIAGNOSIS — E663 Overweight: Secondary | ICD-10-CM | POA: Diagnosis present

## 2020-09-10 DIAGNOSIS — Z20822 Contact with and (suspected) exposure to covid-19: Secondary | ICD-10-CM | POA: Diagnosis present

## 2020-09-10 DIAGNOSIS — W19XXXA Unspecified fall, initial encounter: Secondary | ICD-10-CM | POA: Diagnosis not present

## 2020-09-10 DIAGNOSIS — M48062 Spinal stenosis, lumbar region with neurogenic claudication: Secondary | ICD-10-CM | POA: Diagnosis not present

## 2020-09-10 DIAGNOSIS — Z79891 Long term (current) use of opiate analgesic: Secondary | ICD-10-CM | POA: Diagnosis not present

## 2020-09-10 DIAGNOSIS — R600 Localized edema: Secondary | ICD-10-CM | POA: Diagnosis not present

## 2020-09-10 DIAGNOSIS — F418 Other specified anxiety disorders: Secondary | ICD-10-CM | POA: Diagnosis not present

## 2020-09-10 DIAGNOSIS — W1830XD Fall on same level, unspecified, subsequent encounter: Secondary | ICD-10-CM | POA: Diagnosis not present

## 2020-09-10 DIAGNOSIS — I10 Essential (primary) hypertension: Secondary | ICD-10-CM | POA: Diagnosis present

## 2020-09-10 DIAGNOSIS — M21371 Foot drop, right foot: Secondary | ICD-10-CM | POA: Diagnosis present

## 2020-09-10 DIAGNOSIS — N390 Urinary tract infection, site not specified: Secondary | ICD-10-CM | POA: Diagnosis not present

## 2020-09-10 DIAGNOSIS — M48061 Spinal stenosis, lumbar region without neurogenic claudication: Secondary | ICD-10-CM | POA: Diagnosis present

## 2020-09-10 DIAGNOSIS — Z9889 Other specified postprocedural states: Secondary | ICD-10-CM | POA: Diagnosis not present

## 2020-09-10 DIAGNOSIS — D62 Acute posthemorrhagic anemia: Secondary | ICD-10-CM | POA: Diagnosis not present

## 2020-09-10 DIAGNOSIS — E876 Hypokalemia: Secondary | ICD-10-CM | POA: Diagnosis present

## 2020-09-10 DIAGNOSIS — M545 Low back pain, unspecified: Secondary | ICD-10-CM | POA: Diagnosis not present

## 2020-09-10 DIAGNOSIS — R2689 Other abnormalities of gait and mobility: Secondary | ICD-10-CM | POA: Diagnosis present

## 2020-09-10 DIAGNOSIS — T8130XA Disruption of wound, unspecified, initial encounter: Secondary | ICD-10-CM | POA: Diagnosis not present

## 2020-09-10 MED ORDER — DOCUSATE SODIUM 100 MG PO CAPS
100.0000 mg | ORAL_CAPSULE | Freq: Every day | ORAL | Status: DC
  Administered 2020-09-10 – 2020-09-17 (×8): 100 mg via ORAL
  Filled 2020-09-10 (×8): qty 1

## 2020-09-10 MED ORDER — VITAMIN E 45 MG (100 UNIT) PO CAPS
400.0000 [IU] | ORAL_CAPSULE | Freq: Every day | ORAL | Status: DC
  Administered 2020-09-10 – 2020-09-12 (×3): 400 [IU] via ORAL
  Filled 2020-09-10 (×4): qty 4

## 2020-09-10 MED ORDER — PREDNISONE 20 MG PO TABS
20.0000 mg | ORAL_TABLET | Freq: Every day | ORAL | Status: DC
  Administered 2020-09-11: 20 mg via ORAL
  Filled 2020-09-10 (×3): qty 1

## 2020-09-10 MED ORDER — HYDROCHLOROTHIAZIDE 25 MG PO TABS
12.5000 mg | ORAL_TABLET | Freq: Every day | ORAL | Status: DC
  Administered 2020-09-10 – 2020-09-12 (×3): 12.5 mg via ORAL
  Filled 2020-09-10 (×2): qty 1
  Filled 2020-09-10: qty 0.5

## 2020-09-10 MED ORDER — LEFLUNOMIDE 20 MG PO TABS
10.0000 mg | ORAL_TABLET | Freq: Every day | ORAL | Status: DC
  Administered 2020-09-10 – 2020-09-17 (×7): 10 mg via ORAL
  Filled 2020-09-10 (×8): qty 0.5

## 2020-09-10 MED ORDER — ESCITALOPRAM OXALATE 10 MG PO TABS
20.0000 mg | ORAL_TABLET | Freq: Every day | ORAL | Status: DC
  Administered 2020-09-10 – 2020-09-17 (×8): 20 mg via ORAL
  Filled 2020-09-10 (×2): qty 1
  Filled 2020-09-10: qty 2
  Filled 2020-09-10: qty 1
  Filled 2020-09-10 (×3): qty 2
  Filled 2020-09-10: qty 1

## 2020-09-10 MED ORDER — BUPROPION HCL ER (XL) 150 MG PO TB24
300.0000 mg | ORAL_TABLET | Freq: Every day | ORAL | Status: DC
  Administered 2020-09-10 – 2020-09-17 (×8): 300 mg via ORAL
  Filled 2020-09-10 (×3): qty 1
  Filled 2020-09-10: qty 2
  Filled 2020-09-10: qty 1
  Filled 2020-09-10 (×3): qty 2

## 2020-09-10 MED ORDER — ASCORBIC ACID 500 MG PO TABS
500.0000 mg | ORAL_TABLET | Freq: Every day | ORAL | Status: DC
  Administered 2020-09-10 – 2020-09-17 (×8): 500 mg via ORAL
  Filled 2020-09-10 (×8): qty 1

## 2020-09-10 MED ORDER — METHOCARBAMOL 500 MG PO TABS
500.0000 mg | ORAL_TABLET | Freq: Four times a day (QID) | ORAL | Status: DC | PRN
  Administered 2020-09-10 – 2020-09-12 (×4): 500 mg via ORAL
  Filled 2020-09-10 (×4): qty 1

## 2020-09-10 MED ORDER — LOSARTAN POTASSIUM 50 MG PO TABS
100.0000 mg | ORAL_TABLET | Freq: Every day | ORAL | Status: DC
  Administered 2020-09-10 – 2020-09-17 (×7): 100 mg via ORAL
  Filled 2020-09-10: qty 2
  Filled 2020-09-10: qty 4
  Filled 2020-09-10 (×5): qty 2

## 2020-09-10 NOTE — ED Notes (Signed)
Pt on bedpan, staff at bedside.

## 2020-09-10 NOTE — Progress Notes (Signed)
PROGRESS NOTE    Haley Roberts  [id number] DOB: February 18, 1945 DOA: 09/09/2020 PCP: Binnie Rail, MD   Chief Complaint  Patient presents with   Back Pain   Hip Pain   Brief Narrative: As per admitting "75 y.o. female with medical history significant for chronic back pain on chronic opioid, rheumatoid arthritis on chronic prednisone, fibromyalgia, and hypertension recent ED visit on 8/21 for a fall and found to have nondisplaced periprosthetic fracture of the right medial femoral condyle and right foot avulsion fracture and discharged with instruction for outpatient orthopedic follow-up, comes back to the ED with worsening lower back pain.    Patient reports that for the past several months she has had gradual worsening of her lower back pain.  This became more unbearable in the past week.    States the pain is in her lower lumbar area and radiates to bilateral buttocks.  Had some numbness and tingling before but now it is mostly pain.  She normally takes 10 mg Norco prescribed by her rheumatologist for pain number feels it is less and less effective.  Denies any saddle anesthesia.  No urinary or bowel incontinence.  She had planned appointment with Dr. Vertell Limber of neurosurgery and spine 8/25 but was not able to make it to her appointment due to increased pain.   ED Course: She was afebrile, normotensive on room air in a hallway bed.  CBC negative for leukocytosis.  Has chronic anemia 10.4.  BMP otherwise unremarkable. MRI of the lumbar spine showing facet arthropathy and degenerative anterior listhesis of multiple levels. ED physician did discuss with on-call neurosurgery who just recommended her to follow-up outpatient.   She was given several doses of '1mg'$  Dilaudid and 1 dose of increased prednisone but continued to have intractable back pain so hospitalist was called for pain management.   Subjective: Patient reports currently feels well no pain if she does not move but has pain when she  moves, has needed multiple IV opiates  Assessment & Plan: Intractable low back pain Acute on chronic low back pain Patient had MRI of lumbar spine and neurosurgeon on-call was discussed advised outpatient follow-up.  She is admitted for pain control. Normally on oral opiates at home continue IV morphine 2 mg every 3 hr prn oral Percocet 10 mg q4hr prn, lidocaine patch, muscle relaxant.  PT OT evaluation.  Also on increased dose of prednisone 20 mg from 5 mg.  Denies bowel or bladder incontinence, able to move her lower extremities but limited due to pain.  Rheumatoid arthritis/fibromyalgia on chronic prednisone dose increased due to back pain.  Continue leflunomide  Hypertension: Stable on HCTZ and losartan  Depression continue bupropion and Lexapro  History of nondisplaced right periprosthetic fracture of the medial femoral condyle with right foot avulsion fractures of the anterior calcaneus: From fall and had ED visit 8/21 continue outpatient follow-up continue pain control  Anemia, appears chronic.  Outpatient follow-up with PCP  Diet Order             Diet Heart Room service appropriate? Yes; Fluid consistency: Thin  Diet effective now                   Patient's There is no height or weight on file to calculate BMI.  DVT prophylaxis: enoxaparin (LOVENOX) injection 40 mg Start: 09/10/20 1000 Code Status:   Code Status: Full Code  Family Communication: plan of care discussed with patient at bedside. Status is: admitted as Observation Patient  remains hospitalized for ongoing management of pain acute on chronic and intractable needing IV opiates will need at least 2 midnight stay.  Dispo: The patient is from: Home              Anticipated d/c is to:  TBD              Patient currently is not medically stable to d/c.   Difficult to place patient No  Unresulted Labs (From admission, onward)     Start     Ordered   09/11/20 0500  CBC  Tomorrow morning,   R        09/10/20  0744   09/11/20 [id number]  Basic metabolic panel  Tomorrow morning,   R        09/10/20 0744            Medications reviewed:  Scheduled Meds:  vitamin C  500 mg Oral Daily   buPROPion  300 mg Oral Daily   enoxaparin (LOVENOX) injection  40 mg Subcutaneous Q24H   escitalopram  20 mg Oral Daily   hydrochlorothiazide  12.5 mg Oral Daily   leflunomide  10 mg Oral Daily   lidocaine  1 patch Transdermal Q24H   losartan  100 mg Oral Daily   [START ON 09/11/2020] predniSONE  20 mg Oral Q breakfast   vitamin E  400 Units Oral Daily   Continuous Infusions: Consultants:see note  Procedures:see note Antimicrobials: Anti-infectives (From admission, onward)    None      Culture/Microbiology No results found for: SDES, SPECREQUEST, CULT, REPTSTATUS  Other culture-see note  Objective: Vitals: Today's Vitals   09/10/20 0612 09/10/20 0653 09/10/20 1035 09/10/20 1048  BP: (!) 167/116   (!) 159/90  Pulse: 95   92  Resp: 18   16  Temp:      TempSrc:      SpO2: 97%   94%  PainSc:  10-Worst pain ever 7     No intake or output data in the 24 hours ending 09/10/20 1330 There were no vitals filed for this visit. Weight change:   Intake/Output from previous day: No intake/output data recorded. Intake/Output this shift: No intake/output data recorded. There were no vitals filed for this visit. Examination: General exam: AAO 3, older than stated age, weak appearing. HEENT:Oral mucosa moist, Ear/Nose WNL grossly,dentition normal. Respiratory system: bilaterally diminished,no use of accessory muscle, non tender. Cardiovascular system: S1 & S2 +,No JVD. Gastrointestinal system: Abdomen soft, NT,ND, BS+. Nervous System:Alert, awake, moving all her extremities intact sensation but has limited mobility in lower extremities due to pain  Extremities:No edema, distal peripheral pulses palpable.  Skin: No rashes,no icterus. MSK: Normal muscle bulk,tone, power.  Data Reviewed: I have personally  reviewed following labs and imaging studies CBC: Recent Labs  Lab 09/09/20 2248  WBC 6.2  NEUTROABS 5.3  HGB 10.4*  HCT 32.9*  MCV 94.5  PLT [id number]   Basic Metabolic Panel: Recent Labs  Lab 09/09/20 2248  NA 139  K 3.6  CL 105  CO2 25  GLUCOSE 126*  BUN 25*  CREATININE 0.58  CALCIUM 9.3   GFR: CrCl cannot be calculated (Unknown ideal weight.). Liver Function Tests: No results for input(s): AST, ALT, ALKPHOS, BILITOT, PROT, ALBUMIN in the last 168 hours. No results for input(s): LIPASE, AMYLASE in the last 168 hours. No results for input(s): AMMONIA in the last 168 hours. Coagulation Profile: No results for input(s): INR, PROTIME in the last 168 hours. Cardiac  Enzymes: No results for input(s): CKTOTAL, CKMB, CKMBINDEX, TROPONINI in the last 168 hours. BNP (last 3 results) No results for input(s): PROBNP in the last 8760 hours. HbA1C: No results for input(s): HGBA1C in the last 72 hours. CBG: No results for input(s): GLUCAP in the last 168 hours. Lipid Profile: No results for input(s): CHOL, HDL, LDLCALC, TRIG, CHOLHDL, LDLDIRECT in the last 72 hours. Thyroid Function Tests: No results for input(s): TSH, T4TOTAL, FREET4, T3FREE, THYROIDAB in the last 72 hours. Anemia Panel: No results for input(s): VITAMINB12, FOLATE, FERRITIN, TIBC, IRON, RETICCTPCT in the last 72 hours. Sepsis Labs: No results for input(s): PROCALCITON, LATICACIDVEN in the last 168 hours.  Recent Results (from the past 240 hour(s))  Resp Panel by RT-PCR (Flu A&B, Covid) Nasopharyngeal Swab     Status: None   Collection Time: 09/09/20 10:22 PM   Specimen: Nasopharyngeal Swab; Nasopharyngeal(NP) swabs in vial transport medium  Result Value Ref Range Status   SARS Coronavirus 2 by RT PCR NEGATIVE NEGATIVE Final    Comment: (NOTE) SARS-CoV-2 target nucleic acids are NOT DETECTED.  The SARS-CoV-2 RNA is generally detectable in upper respiratory specimens during the acute phase of infection. The  lowest concentration of SARS-CoV-2 viral copies this assay can detect is 138 copies/mL. A negative result does not preclude SARS-Cov-2 infection and should not be used as the sole basis for treatment or other patient management decisions. A negative result may occur with  improper specimen collection/handling, submission of specimen other than nasopharyngeal swab, presence of viral mutation(s) within the areas targeted by this assay, and inadequate number of viral copies(<138 copies/mL). A negative result must be combined with clinical observations, patient history, and epidemiological information. The expected result is Negative.  Fact Sheet for Patients:  EntrepreneurPulse.com.au  Fact Sheet for Healthcare Providers:  IncredibleEmployment.be  This test is no t yet approved or cleared by the [place] FDA and  has been authorized for detection and/or diagnosis of SARS-CoV-2 by FDA under an Emergency Use Authorization (EUA). This EUA will remain  in effect (meaning this test can be used) for the duration of the COVID-19 declaration under Section 564(b)(1) of the Act, 21 U.S.C.section 360bbb-3(b)(1), unless the authorization is terminated  or revoked sooner.       Influenza A by PCR NEGATIVE NEGATIVE Final   Influenza B by PCR NEGATIVE NEGATIVE Final    Comment: (NOTE) The Xpert Xpress SARS-CoV-2/FLU/RSV plus assay is intended as an aid in the diagnosis of influenza from Nasopharyngeal swab specimens and should not be used as a sole basis for treatment. Nasal washings and aspirates are unacceptable for Xpert Xpress SARS-CoV-2/FLU/RSV testing.  Fact Sheet for Patients: EntrepreneurPulse.com.au  Fact Sheet for Healthcare Providers: IncredibleEmployment.be  This test is not yet approved or cleared by the [place] FDA and has been authorized for detection and/or diagnosis of SARS-CoV-2 by FDA under  an Emergency Use Authorization (EUA). This EUA will remain in effect (meaning this test can be used) for the duration of the COVID-19 declaration under Section 564(b)(1) of the Act, 21 U.S.C. section 360bbb-3(b)(1), unless the authorization is terminated or revoked.  Performed at [facility], [place] [address] [place], [place] [address]      Radiology Studies: DG Pelvis 1-2 Views  Result Date: 09/09/2020 CLINICAL DATA:  Fall on 09/05/2020 with known femur fracture and pelvic pain, initial encounter EXAM: PELVIS - 1 VIEW COMPARISON:  None. FINDINGS: Pelvic ring is intact. Degenerative changes of lumbar spine and hip joints are seen.  No acute fracture or dislocation is noted. IMPRESSION: Degenerative change without acute abnormality. Electronically Signed   By: Inez Catalina M.D.   On: 09/09/2020 23:09   MR LUMBAR SPINE WO CONTRAST  Result Date: 09/09/2020 CLINICAL DATA:  Low back pain for greater than 6 weeks. Recent fall. EXAM: MRI LUMBAR SPINE WITHOUT CONTRAST TECHNIQUE: Multiplanar, multisequence MR imaging of the lumbar spine was performed. No intravenous contrast was administered. COMPARISON:  Radiography 06/28/2020 FINDINGS: Segmentation:  5 lumbar type vertebral bodies. Alignment: 3 mm degenerative anterolisthesis L3-4. 9 mm degenerative anterolisthesis L4-5. 5 mm degenerative anterolisthesis L5-S1. one or 2 mm of retrolisthesis at T12-L1, L1-2 and L2-3. Vertebrae:  No fracture or focal bone lesion. Conus medullaris and cauda equina: Conus extends to the L1 level. Conus and cauda equina appear normal. Paraspinal and other soft tissues: Negative Disc levels: Mild bulging of the discs at T11-12, T12-L1, L1-2 and L2-3. No compressive stenosis of the canal or foramina. L3-4: Facet arthropathy with 3 mm of anterolisthesis. Bulging of the disc. Stenosis of the lateral recesses and neural foramina that could possibly be symptomatic. Facet arthropathy would likely contribute to back  pain. L4-5: Facet arthropathy with 9 mm of anterolisthesis. Pseudo disc herniation. Severe spinal stenosis likely to cause neural compression on both sides. L5-S1: Facet arthropathy with 5 mm of anterolisthesis. Pseudo disc herniation. Stenosis of the subarticular lateral recesses and of the intervertebral foramen on the left. Neural compression is possible at this level, particularly affecting the left L5 nerve. IMPRESSION: L4-5: Advanced facet arthropathy with degenerative anterolisthesis of 9 mm. Severe multifactorial spinal stenosis which could cause neural compression in the central canal as well as in both neural foramina. L5-S1: Facet arthropathy with degenerative anterolisthesis of 5 mm. Narrowing of the subarticular lateral recesses and of the intervertebral foramen on the left. Left L5 nerve could be compressed. L3-4: Facet arthropathy with 3 mm of anterolisthesis. Mild stenosis of the lateral recesses and neural foramina could possibly be symptomatic. Obviously, the facet arthropathy at these levels could contribute to low back pain. Electronically Signed   By: Nelson Chimes M.D.   On: 09/09/2020 20:26     LOS: 0 days   Antonieta Pert, MD [facility]  09/10/2020, 1:30 PM

## 2020-09-10 NOTE — ED Notes (Signed)
Pt unable to have a bowel movement, produced a small amount of urine.

## 2020-09-10 NOTE — ED Notes (Signed)
Patient's son called asking for an update on mom. Please call son.

## 2020-09-10 NOTE — Consult Note (Signed)
Chief Complaint: Worsening lower back pain   HPI: Haley Roberts is a 75 y.o. female with medical history significant for chronic back pain on chronic opioid, rheumatoid arthritis on chronic prednisone, fibromyalgia, and hypertension who presents with concerns of worsening lower back pain.  Patient reports that for the past several months she has had gradual worsening of her lower back pain.  This became more unbearable in the past week.  She also recently was evaluated in the ED on 8/21 for a fall and was found to have nondisplaced Henrene Pastor prosthetic fracture of the right medial femoral condyle and right foot avulsion fracture of the anterior calcaneus.  She has not yet been able to see orthopedic outpatient yet.   States the pain is in her lower lumbar area and radiates to bilateral buttocks.  Had some numbness and tingling before but now it is mostly pain.  She normally takes 10 mg Norco prescribed by her rheumatologist for pain number feels it is less and less effective.  Denies any saddle anesthesia.  No urinary or bowel incontinence.  She had planned appointment with Dr. Vertell Limber of neurosurgery and spine today but was not able to make it to her appointment due to increased pain.   ED Course: She was afebrile, normotensive on room air in a hallway bed.  CBC negative for leukocytosis.  Has chronic anemia 10.4.  BMP otherwise unremarkable.   MRI of the lumbar spine showing facet arthropathy and degenerative anterior listhesis of multiple levels. ED physician did discuss with on-call neurosurgery who just recommends her to follow-up outpatient.   She was given several doses of '1mg'$  Dilaudid and 1 dose of increased prednisone but continued to have intractable back pain so hospitalist was called for pain management.   Review of Systems: Constitutional: No Weight Change, No Fever ENT/Mouth: No sore throat, No Rhinorrhea Eyes: No Eye Pain, No Vision Changes Cardiovascular: No Chest Pain, no  SOB, + Edema Respiratory: No Cough, Gastrointestinal: No Nausea, No Vomiting, No Diarrhea, No Constipation, No Pain Genitourinary: no Urinary Incontinence Musculoskeletal: No Arthralgias, No Myalgias Skin: No Skin Lesions, No Pruritus, Neuro: no Weakness, No Numbness Psych: No Anxiety/Panic, No Depression, no decrease appetite Heme/Lymph: No Bruising, No Bleeding  Past Medical History:  Diagnosis Date   Anemia    Anxiety    Depression    Fibromyalgia    Hyperglycemia    HYPERGLYCEMIA, FASTING 08/22/2007   Hypertension    Multiple thyroid nodules    gets ultra sound yearly on thryoid   Rheumatoid arthritis(714.0)     Allergies  Allergen Reactions   Infliximab Shortness Of Breath, Other (See Comments) and Hypertension    Remicade   Sulfa Antibiotics Other (See Comments)    Convulsions    Sulfacetamide Sodium-Sulfur Other (See Comments)    Convulsions   Sulfonamide Derivatives Other (See Comments)    BAD HEADACHES/CHILLS/FEVER and CONVULSIONS with Sulfa antibiotics and Sulfacetamide Sodium-Sulfur    No current facility-administered medications on file prior to encounter.   Current Outpatient Medications on File Prior to Encounter  Medication Sig Dispense Refill   buPROPion (WELLBUTRIN XL) 300 MG 24 hr tablet Take 300 mg by mouth daily.     Calcium Carbonate-Vit D-Min (CALTRATE PLUS PO) Take 1 tablet by mouth in the morning.     escitalopram (LEXAPRO) 20 MG tablet Take 20 mg by mouth daily.     hydrochlorothiazide (HYDRODIURIL) 12.5 MG tablet Take 1 tablet (12.5 mg total) by mouth daily. 90 tablet  3   HYDROcodone-acetaminophen (NORCO) 10-325 MG tablet Take 1 tablet by mouth 3 (three) times daily as needed (for pain).     leflunomide (ARAVA) 10 MG tablet Take 10 mg by mouth daily.     losartan (COZAAR) 100 MG tablet Take 1 tablet (100 mg total) by mouth daily. 90 tablet 3   methocarbamol (ROBAXIN) 500 MG tablet Take 1 tablet (500 mg total) by mouth every 6 (six) hours as  needed for muscle spasms. 50 tablet 0   Multiple Vitamins-Minerals (WOMENS DAILY FORMULA) TABS Take 1 tablet by mouth daily.       predniSONE (DELTASONE) 5 MG tablet Take 5 mg by mouth daily.     vitamin C (ASCORBIC ACID) 500 MG tablet Take 500 mg by mouth daily.       vitamin E 400 UNIT capsule Take 400 Units by mouth daily.       benzonatate (TESSALON PERLES) 100 MG capsule Take 1 capsule (100 mg total) by mouth 3 (three) times daily as needed. (Patient not taking: No sig reported) 20 capsule 0    Physical Exam: Vitals:   09/10/20 1048 09/10/20 1502  BP: (!) 159/90 (!) 158/93  Pulse: 92 90  Resp: 16 17  Temp:  98.2 F (36.8 C)  SpO2: 94% 99%   Body mass index is 29.06 kg/m. She is alert and oriented x3. No shortness of breath or chest pain at present. Abdomen is soft and nontender.  No loss of bowel bladder control, no rebound tenderness Mild swelling and tenderness in the right knee.  Pain and discomfort with attempts at range of motion.  Compartments are soft and nontender.  No pain with passive stretch of the EHL.  Sensation light touch is grossly intact in the foot.  No significant swelling in the foot or ankle region. No complaints of pain with motor range of motion of the left hip, knee, ankle. Patient complains of significant low back pain radiating into the lower extremities right leg worse than the left.  Image: DG Pelvis 1-2 Views  Result Date: 09/09/2020 CLINICAL DATA:  Fall on 09/05/2020 with known femur fracture and pelvic pain, initial encounter EXAM: PELVIS - 1 VIEW COMPARISON:  None. FINDINGS: Pelvic ring is intact. Degenerative changes of lumbar spine and hip joints are seen. No acute fracture or dislocation is noted. IMPRESSION: Degenerative change without acute abnormality. Electronically Signed   By: Inez Catalina M.D.   On: 09/09/2020 23:09   DG Knee 1-2 Views Right  Result Date: 09/10/2020 CLINICAL DATA:  Periprosthetic distal femoral fracture, inability to  bear weight EXAM: RIGHT KNEE - 1-2 VIEW COMPARISON:  09/05/2020 FINDINGS: Frontal and cross-table lateral views of the right knee are obtained. The minimally displaced periprosthetic fracture involving the medial femoral condyle is again noted and unchanged. The right knee arthroplasty is in stable position. Small joint effusion. Progressive subcutaneous edema. IMPRESSION: 1. Stable periprosthetic fracture of the medial femoral condyle. 2. Increasing soft tissue edema. 3. Small joint effusion. Electronically Signed   By: Randa Ngo M.D.   On: 09/10/2020 18:18   DG Tibia/Fibula Right  Result Date: 09/05/2020 CLINICAL DATA:  Fall 1 week ago. Persistent right leg pain and swelling. EXAM: RIGHT TIBIA AND FIBULA - 2 VIEW COMPARISON:  None. FINDINGS: Fracture suggested of the superior aspect of the medial femoral condyle above the femoral component of the total knee prosthesis. No other evidence of a fracture. Specifically, no evidence of a tibia or fibular fracture. Knee prosthetic components appear  well seated and aligned. Ankle joint normally aligned. There is subcutaneous soft tissue edema extending from the knee through the ankle. IMPRESSION: 1. Possible acute fracture of the medial femoral condyle. This would be better evaluated with knee radiographs. 2. No other evidence of a fracture. No dislocation. No evidence of loosening of the knee joint orthopedic hardware. 3. Nonspecific soft tissue edema. Electronically Signed   By: Lajean Manes M.D.   On: 09/05/2020 16:40   DG Ankle Complete Right  Result Date: 09/05/2020 CLINICAL DATA:  [place] 1 week ago.  Right lower leg pain and swelling. EXAM: RIGHT ANKLE - COMPLETE 3+ VIEW COMPARISON:  None. FINDINGS: No fracture or bone lesion. Ankle joint is normally spaced and aligned. There is diffuse soft tissue swelling that extends into the foot. IMPRESSION: 1. No fracture.  No ankle joint abnormality. 2. Nonspecific soft tissue swelling the ankle through the foot.  Electronically Signed   By: Lajean Manes M.D.   On: 09/05/2020 16:37   CT Knee Right Wo Contrast  Result Date: 09/05/2020 CLINICAL DATA:  Fracture of the right medial femoral condyle and possible fracture of the lateral condyle seen on x-ray today. EXAM: CT OF THE right KNEE WITHOUT CONTRAST TECHNIQUE: Multidetector CT imaging of the right knee was performed according to the standard protocol. Multiplanar CT image reconstructions were also generated. COMPARISON:  Right knee radiographs 09/05/2020 FINDINGS: Bones/Joint/Cartilage Right total knee arthroplasty with patellar femoral component. Streak artifact arising from the arthroplasty components limits evaluation. There is evidence of focal cortical disruption with an oblique periprosthetic fracture involving the medial femoral condyle as seen on prior radiographs. No definite fracture identified in the lateral femoral condyle. No dislocation of the knee joint or patella. Moderate effusion. Ligaments Suboptimally assessed by CT. Muscles and Tendons Limit evaluation.  No gross abnormalities identified. Soft tissues Diffuse soft tissue edema. IMPRESSION: 1. Right total knee arthroplasty. 2. Nondisplaced periprosthetic fracture of the medial femoral condyle. 3. Moderate effusion. Electronically Signed   By: Lucienne Capers M.D.   On: 09/05/2020 21:17   MR LUMBAR SPINE WO CONTRAST  Result Date: 09/09/2020 CLINICAL DATA:  Low back pain for greater than 6 weeks. Recent fall. EXAM: MRI LUMBAR SPINE WITHOUT CONTRAST TECHNIQUE: Multiplanar, multisequence MR imaging of the lumbar spine was performed. No intravenous contrast was administered. COMPARISON:  Radiography 06/28/2020 FINDINGS: Segmentation:  5 lumbar type vertebral bodies. Alignment: 3 mm degenerative anterolisthesis L3-4. 9 mm degenerative anterolisthesis L4-5. 5 mm degenerative anterolisthesis L5-S1. one or 2 mm of retrolisthesis at T12-L1, L1-2 and L2-3. Vertebrae:  No fracture or focal bone lesion.  Conus medullaris and cauda equina: Conus extends to the L1 level. Conus and cauda equina appear normal. Paraspinal and other soft tissues: Negative Disc levels: Mild bulging of the discs at T11-12, T12-L1, L1-2 and L2-3. No compressive stenosis of the canal or foramina. L3-4: Facet arthropathy with 3 mm of anterolisthesis. Bulging of the disc. Stenosis of the lateral recesses and neural foramina that could possibly be symptomatic. Facet arthropathy would likely contribute to back pain. L4-5: Facet arthropathy with 9 mm of anterolisthesis. Pseudo disc herniation. Severe spinal stenosis likely to cause neural compression on both sides. L5-S1: Facet arthropathy with 5 mm of anterolisthesis. Pseudo disc herniation. Stenosis of the subarticular lateral recesses and of the intervertebral foramen on the left. Neural compression is possible at this level, particularly affecting the left L5 nerve. IMPRESSION: L4-5: Advanced facet arthropathy with degenerative anterolisthesis of 9 mm. Severe multifactorial spinal stenosis which could cause neural  compression in the central canal as well as in both neural foramina. L5-S1: Facet arthropathy with degenerative anterolisthesis of 5 mm. Narrowing of the subarticular lateral recesses and of the intervertebral foramen on the left. Left L5 nerve could be compressed. L3-4: Facet arthropathy with 3 mm of anterolisthesis. Mild stenosis of the lateral recesses and neural foramina could possibly be symptomatic. Obviously, the facet arthropathy at these levels could contribute to low back pain. Electronically Signed   By: Nelson Chimes M.D.   On: 09/09/2020 20:26   US Venous Img Lower Right (DVT Study)  Result Date: 09/05/2020 CLINICAL DATA:  Right leg swelling EXAM: RIGHT LOWER EXTREMITY VENOUS DOPPLER ULTRASOUND TECHNIQUE: Gray-scale sonography with compression, as well as color and duplex ultrasound, were performed to evaluate the deep venous system(s) from the level of the common  femoral vein through the popliteal and proximal calf veins. COMPARISON:  None. FINDINGS: VENOUS Normal compressibility of the common femoral, superficial femoral, and popliteal veins, as well as the visualized calf veins. Visualized portions of profunda femoral vein and great saphenous vein unremarkable. No filling defects to suggest DVT on grayscale or color Doppler imaging. Doppler waveforms show normal direction of venous flow, normal respiratory plasticity and response to augmentation. Limited views of the contralateral common femoral vein are unremarkable. OTHER None. Limitations: none IMPRESSION: No evidence of right lower extremity DVT. Electronically Signed   By: Rolm Baptise M.D.   On: 09/05/2020 17:22   DG Knee Complete 4 Views Right  Result Date: 09/05/2020 CLINICAL DATA:  RIGHT knee pain after falling 1 week ago. EXAM: RIGHT KNEE - COMPLETE 4+ VIEW COMPARISON:  None FINDINGS: Patient has had prior RIGHT knee arthroplasty. The femoral and tibial components appear well seated. There is linear lucency and step-off along the MEDIAL femoral condyle, suspicious for nondisplaced fracture. Possible LATERAL femoral condyles fracture. Small joint effusion noted. IMPRESSION: 1. Suspect minimally displaced fracture along the MEDIAL femoral condyle. Possible fracture along the LATERAL condyle. 2. Small effusion. Electronically Signed   By: Nolon Nations M.D.   On: 09/05/2020 17:48   DG Foot Complete Right  Result Date: 09/05/2020 CLINICAL DATA:  One week of leg swelling following fall. RIGHT lower leg pain and swelling in a 75 year old female. EXAM: RIGHT FOOT COMPLETE - 3+ VIEW COMPARISON:  Prior MRI dated March of 2012. FINDINGS: Severe first metatarsophalangeal osteoarthritic changes. Marked midfoot degenerative changes about the tarsal metatarsal joints. Diffuse soft tissue swelling about the foot and ankle. Soft tissue swelling worse over the forefoot. Avulsion fracture along the lateral calcaneus on  the AP view. IMPRESSION: 1. Avulsion fracture from the anterior calcaneus compatible with extensor digitorum brevis avulsion best seen on the AP view. 2. Severe first metatarsophalangeal and midfoot degenerative changes. 3. Diffuse soft tissue swelling, correlate with signs of infection. Electronically Signed   By: Zetta Bills M.D.   On: 09/05/2020 16:46    A/P: Patient presents to the emergency room and was admitted for severe increase in back pain.  Patient was also noted to have a medial femoral condyle fracture.  Repeat imaging demonstrates no change in the position of the fracture.  She has a small joint effusion.  X-rays were compared to her prior ones done on 09/05/2020.  At this point time the patient demonstrates no signs or symptoms of a compartment syndrome that would warrant acute surgical intervention.  I recommend that she continue with a knee immobilizer pending further evaluation by Dr. Alvan Dame.  Patient was scheduled to follow-up with  Dr. Alvan Dame as an outpatient but fortunately he is on call Sunday and so we will be can evaluate her then and determine if surgical intervention for the knee is required.  With respect to the lumbar spine I will defer this to Dr. Vertell Limber as he was originally consulted to see the patient for this.  Pain management per hospitalist team.

## 2020-09-10 NOTE — Evaluation (Signed)
Physical Therapy Evaluation Patient Details Name: Haley Roberts MRN: [id number] DOB: January 14, 1946 Today's Date: 09/10/2020   History of Present Illness  Haley Roberts is a 75 y.o. female who presents with concerns of worsening lower back pain. Patient reports that for the past several months she has had gradual worsening of her lower back pain. This became more unbearable in the past week. She also recently was evaluated in the ED on 8/21 for a fall and was found to have nondisplaced Henrene Pastor prosthetic fracture of the right medial femoral condyle and right foot avulsion fracture of the anterior calcaneus- Knee immobilizer nonweightbearing follow-up with [facility]. MRI of the lumbar spine showing facet arthropathy and degenerative anterior listhesis of multiple levels.  ED physician did discuss with on-call neurosurgery who just recommends her to follow-up outpatient. PMH: chronic back pain on chronic opioid, RA, fibromyalgia, and HTN   Clinical Impression  Pt admitted with above diagnosis. Pt from home, independent at baseline and occasional caregiver to spouse with ALS, has stair lifts at home, well equipped with DME. Pt currently significantly limited by pain, requiring min-mod A to roll into sidelying with assist at trunk and pt using UE to pull on bedrails. Pt with note from 8/21 that states RLE knee immobilizer and NWB with f/u to ortho but not specified this hospitalization- notified MD for clarification. Pt unable to sit up EOB with +1 assist due to no knee immobilizer present and needing assist to maintain R knee extension. Currently recommending 24 hr assist, pt does have sons available some, but unsure if 24 hr- SNF vs HHPT pending progress with therapy and pain control. Pt currently with functional limitations due to the deficits listed below (see PT Problem List). Pt will benefit from skilled PT to increase their independence and safety with mobility to allow discharge to the venue listed  below.       Follow Up Recommendations Supervision/Assistance - 24 hour (SNF vs HHPT pending progress)    Equipment Recommendations  None recommended by PT    Recommendations for Other Services       Precautions / Restrictions Precautions Precautions: Fall Precaution Comments: RLE fractures identified 8/21 Required Braces or Orthoses: Knee Immobilizer - Right Knee Immobilizer - Right: Other (comment) (per 8/21 note states RLE knee immobilizer and NWB) Restrictions Weight Bearing Restrictions: Yes RLE Weight Bearing: Non weight bearing (per 8/21 note states RLE knee immobilizer and NWB)      Mobility  Bed Mobility Overal bed mobility: Needs Assistance Bed Mobility: Rolling Rolling: Min assist;Mod assist  General bed mobility comments: rolling supine to R/L sidelying to reposition clothing, pt able to reach with UEs for bedrail to assist in rotating trunk with therapist assisting lower body to maintain log roll for back comfort, min A to roll to L sidelying and mod A to roll to R sidelying, increased time and VCs for hand placement, unable to come to sitting with +1 assist due to high pain and RLE requiring assist to prevent knee flexion without knee immobilizer present    Transfers  General transfer comment: RLE NWB per 09/05/20 without duration noted, also R knee immobilizer recommended without duration noted- no immobilizer with pt so not attempted  Ambulation/Gait                Stairs            Wheelchair Mobility    Modified Rankin (Stroke Patients Only)       Balance  Pertinent Vitals/Pain Pain Assessment: 0-10 Pain Score: 10-Worst pain ever Pain Location: low back to bil hips, RLE Pain Descriptors / Indicators: Grimacing;Guarding;Aching;Sharp;Sore Pain Intervention(s): Limited activity within patient's tolerance;Monitored during session;Premedicated before session;Repositioned    Home Living Family/patient expects to be discharged to::  Private residence Living Arrangements: Spouse/significant other Available Help at Discharge: Family;Available PRN/intermittently Type of Home: House Home Access:  (has stair lift from husband with ALS)     Home Layout: Two level;Bed/bath upstairs Home Equipment: Walker - 2 wheels;Bedside commode;Shower seat;Grab bars - toilet;Grab bars - tub/shower;Wheelchair - manual Additional Comments: pt's spouse has ALS with caregivers, pt able to use his w/c, has stair lift, home is w/c accesible    Prior Function           Comments: Pt reports was independent and one of spouse's caregivers, but has been in w/c or bed since 8/21 due to severe pain     Hand Dominance        Extremity/Trunk Assessment   Upper Extremity Assessment Upper Extremity Assessment: Overall WFL for tasks assessed    Lower Extremity Assessment Lower Extremity Assessment: Generalized weakness;RLE deficits/detail;LLE deficits/detail RLE Deficits / Details: no knee immobilizer present, able to DF/PF, unable to perform SLR, decent quad set, hip abd ~50%, reports foot "numb and heavy" LLE Deficits / Details: AROM WNL, able to perform heelslide, SLR with increased pain shooting down RLE, knee flexion ~25%, hip abd ~50%, reports foot "numb and heavy"       Communication   Communication: No difficulties  Cognition Arousal/Alertness: Awake/alert Behavior During Therapy: WFL for tasks assessed/performed Overall Cognitive Status: Within Functional Limits for tasks assessed                       General Comments      Exercises     Assessment/Plan    PT Assessment Patient needs continued PT services  PT Problem List Decreased strength;Decreased range of motion;Decreased activity tolerance;Decreased balance;Decreased mobility;Impaired sensation;Pain       PT Treatment Interventions DME instruction;Gait training;Functional mobility training;Therapeutic activities;Therapeutic exercise;Balance  training;Patient/family education    PT Goals (Current goals can be found in the Care Plan section)  Acute Rehab PT Goals Patient Stated Goal: return home to spouse, sons able to assist some PT Goal Formulation: With patient Time For Goal Achievement: 09/24/20 Potential to Achieve Goals: Fair    Frequency Min 3X/week   Barriers to discharge        Co-evaluation               AM-PAC PT "6 Clicks" Mobility  Outcome Measure Help needed turning from your back to your side while in a flat bed without using bedrails?: A Lot Help needed moving from lying on your back to sitting on the side of a flat bed without using bedrails?: Total Help needed moving to and from a bed to a chair (including a wheelchair)?: Total Help needed standing up from a chair using your arms (e.g., wheelchair or bedside chair)?: Total Help needed to walk in hospital room?: Total Help needed climbing 3-5 steps with a railing? : Total 6 Click Score: 7    End of Session   Activity Tolerance: Patient limited by pain Patient left: in bed (in hallway of ED, no call bell) Nurse Communication: Mobility status;Other (comment) (high pain, no R knee immobilizer, no current WB status clarification) PT Visit Diagnosis: Other abnormalities of gait and mobility (R26.89);Difficulty in walking, not elsewhere classified (R26.2)  Time: [id number] PT Time Calculation (min) (ACUTE ONLY): 26 min   Charges:   PT Evaluation $PT Eval Moderate Complexity: 1 Mod PT Treatments $Therapeutic Activity: 8-22 mins         Tori Akram Kissick PT, DPT 09/10/20, 1:30 PM

## 2020-09-11 LAB — BASIC METABOLIC PANEL
Anion gap: 8 (ref 5–15)
BUN: 19 mg/dL (ref 8–23)
CO2: 28 mmol/L (ref 22–32)
Calcium: 9.2 mg/dL (ref 8.9–10.3)
Chloride: 108 mmol/L (ref 98–111)
Creatinine, Ser: 0.53 mg/dL (ref 0.44–1.00)
GFR, Estimated: >60 mL/min (ref >60)
Glucose, Bld: 88 mg/dL (ref 70–99)
Potassium: 3.4 mmol/L — ABNORMAL LOW (ref 3.5–5.1)
Sodium: 144 mmol/L (ref 135–145)

## 2020-09-11 LAB — CBC
HCT: 31.7 % — ABNORMAL LOW (ref 36.0–46.0)
Hemoglobin: 9.9 g/dL — ABNORMAL LOW (ref 12.0–15.0)
MCH: 29.7 pg (ref 26.0–34.0)
MCHC: 31.2 g/dL (ref 30.0–36.0)
MCV: 95.2 fL (ref 80.0–100.0)
Platelets: 298 10*3/uL (ref 150–400)
RBC: 3.33 MIL/uL — ABNORMAL LOW (ref 3.87–5.11)
RDW: 14 % (ref 11.5–15.5)
WBC: 5.3 10*3/uL (ref 4.0–10.5)
nRBC: 0 % (ref 0.0–0.2)

## 2020-09-11 MED ORDER — POTASSIUM CHLORIDE CRYS ER 20 MEQ PO TBCR
20.0000 meq | EXTENDED_RELEASE_TABLET | Freq: Once | ORAL | Status: AC
  Administered 2020-09-11: 20 meq via ORAL
  Filled 2020-09-11: qty 1

## 2020-09-11 NOTE — Progress Notes (Signed)
   Subjective:    Recheck right knee s/p periprosthetic femur fracture Pt with repeat x-rays yesterday with no signs of fracture displacement Still c/o moderate lumbar pain History of neuropathy in bilateral feet with current decreased sensation Pain with quad activation in the right knee/leg Previous right total knee in 2016 Otherwise doing fair Patient reports pain as moderate.  Objective:   VITALS:   Vitals:   09/11/20 0742 09/11/20 0852  BP: (!) 170/94   Pulse: 80   Resp: 17   Temp:  97.9 F (36.6 C)  SpO2: 96%     Right knee: well healed incision from prior surgery Trace effusion No rashes or edema Not taken thru any rom due to known fracture Currently pt supine due to her back pain Nv intact distally with slight decreased sensation which she states is normal  LABS Recent Labs    09/09/20 2248 09/11/20 0532  HGB 10.4* 9.9*  HCT 32.9* 31.7*  WBC 6.2 5.3  PLT 394 298    Recent Labs    09/09/20 2248 09/11/20 0532  NA 139 144  K 3.6 3.4*  BUN 25* 19  CREATININE 0.58 0.53  GLUCOSE 126* 88     Assessment/Plan:   Right femur periprosthetic fracture after knee replacement - stable fracture currently Recommend knee immobilizer and limited weight bearing on the right leg Dr. Rolena Infante recommends f/u with Dr. Vertell Limber for her lumbar spine Pain management Will continue to monitor her progress      Merla Riches PA-C, [place] [facility] now [facility]  [place] [address], [place], [place] [address] Phone: [phone] [email or web address] Facebook  [employer]

## 2020-09-11 NOTE — Progress Notes (Signed)
PROGRESS NOTE    Haley Roberts  [id number] DOB: [birth year] DOA: 09/09/2020 PCP: Binnie Rail, MD   Chief Complaint  Patient presents with   Back Pain   Hip Pain   Brief Narrative: As per admitting "75 y.o. female with medical history significant for chronic back pain on chronic opioid, rheumatoid arthritis on chronic prednisone, fibromyalgia, and hypertension recent ED visit on 8/21 for a fall and found to have nondisplaced periprosthetic fracture of the right medial femoral condyle and right foot avulsion fracture and discharged with instruction for outpatient orthopedic follow-up, comes back to the ED with worsening lower back pain.    Patient reports that for the past several months she has had gradual worsening of her lower back pain.  This became more unbearable in the past week.    States the pain is in her lower lumbar area and radiates to bilateral buttocks.  Had some numbness and tingling before but now it is mostly pain.  She normally takes 10 mg Norco prescribed by her rheumatologist for pain number feels it is less and less effective.  Denies any saddle anesthesia.  No urinary or bowel incontinence.  She had planned appointment with Dr. Vertell Limber of neurosurgery and spine 8/25 but was not able to make it to her appointment due to increased pain.   ED Course: She was afebrile, normotensive on room air in a hallway bed.  CBC negative for leukocytosis.  Has chronic anemia 10.4.  BMP otherwise unremarkable. MRI of the lumbar spine showing facet arthropathy and degenerative anterior listhesis of multiple levels. ED physician did discuss with on-call neurosurgery who just recommended her to follow-up outpatient.   She was given several doses of '1mg'$  Dilaudid and 1 dose of increased prednisone but continued to have intractable back pain so hospitalist was called for pain management. To proximal left saw her no change in plan indicated right femoral medial condylar fracture advised knee  immobilizer and outpatient follow-up   Subjective:  Seen and examined this morning.  Son is at the bedside. Complains of pain mostly in the lower legs more on the right at recent fracture sites Patient reports pain is controlled when she takes her meds, no bowel bladder incontinence, Patient has been needing several days of IV opiates and percocet  Assessment & Plan:  Intractable low back pain Acute on chronic low back pain MRI of LS done in ED- showed advanced facet arthropathy with joint degenerative changes, mild stenosis at multiple levels-neurosurgeon Dr Marcello Moores was consulted in the ED and advised no acute neurosurgical intervention.  Patient admitted for pain management.  At home on norco 10 tid for many years-Continue  IV morphine 2 mg every 3 hr prn oral Percocet 10 mg q4hr prn, lidocaine patch, muscle relaxant,  local heat, PTOT as tolerated. Cont on prednisone 20 mg ( normally on 5 mg).  Continue laxatives,stool softener as needed.  Sensation intact bilaterally able to move her toes ankle but limited mobility due to pain in the right leg and low back. Pt reports she was supposed to see Dr. Vertell Limber on the day of admission and there was discussion regarding  ?" Cortisone repeat injection"  Rheumatoid arthritis/fibromyalgia on chronic prednisone. On leflunomide  Hypertension: Borderline controlled.  Continue HCTZ and losartan  Depression: continue bupropion and Lexapro  History of nondisplaced right periprosthetic fracture of the medial femoral condyle with right foot avulsion fractures of the anterior calcaneus: From fall and had ED visit 8/21 continue outpatient follow-up continue pain  control.  Continue knee immobilizer, follow-up  w/ Dr Alvan Dame as outpatient seen by Dr. Rolena Infante here.  Anemia, appears chronic: OP f/u.  Mild hypokalemia-replace  Diet Order             Diet Heart Room service appropriate? Yes; Fluid consistency: Thin  Diet effective now                    Patient's Body mass index is 29.06 kg/m.  DVT prophylaxis: enoxaparin (LOVENOX) injection 40 mg Start: 09/10/20 1000 Code Status:   Code Status: Full Code  Family Communication: plan of care discussed with patient and her son at bedside. Status is: Inpatient  Remains inpatient for ongoing management of intractable low back pain with IV opiates, PT OT Dispo: The patient is from: Home              Anticipated d/c is to:  SNF vs [facility]              Patient currently is not medically stable to d/c.   Difficult to place patient No  Unresulted Labs (From admission, onward)    None       Medications reviewed:  Scheduled Meds:  vitamin C  500 mg Oral Daily   buPROPion  300 mg Oral Daily   docusate sodium  100 mg Oral Daily   enoxaparin (LOVENOX) injection  40 mg Subcutaneous Q24H   escitalopram  20 mg Oral Daily   hydrochlorothiazide  12.5 mg Oral Daily   leflunomide  10 mg Oral Daily   lidocaine  1 patch Transdermal Q24H   losartan  100 mg Oral Daily   predniSONE  20 mg Oral Q breakfast   vitamin E  400 Units Oral Daily   Continuous Infusions: Consultants:see note  Procedures:see note Antimicrobials: Anti-infectives (From admission, onward)    None      Culture/Microbiology No results found for: SDES, SPECREQUEST, CULT, REPTSTATUS  Other culture-see note  Objective: Vitals: Today's Vitals   09/11/20 0522 09/11/20 0742 09/11/20 0852 09/11/20 1035  BP: (!) 172/93 (!) 170/94    Pulse: 88 80    Resp: 16 17    Temp: 98.4 F (36.9 C)  97.9 F (36.6 C)   TempSrc: Oral  Oral   SpO2: 94% 96%    Weight:      Height:      PainSc:  '9  8  5     '$ Intake/Output Summary (Last 24 hours) at 09/11/2020 1041 Last data filed at 09/11/2020 1026 Gross per 24 hour  Intake 420 ml  Output 600 ml  Net -180 ml   Filed Weights   09/10/20 1502  Weight: 74.4 kg   Weight change:   Intake/Output from previous day: 08/26 0701 - 08/27 0700 In: 240 [P.O.:240] Out: 0  Intake/Output  this shift: Total I/O In: 180 [P.O.:180] Out: 600 [Urine:600] Filed Weights   09/10/20 1502  Weight: 74.4 kg   Examination: General exam: AAOx3, pleasant, not in distress.  HEENT:Oral mucosa moist, Ear/Nose WNL grossly, dentition normal. Respiratory system: bilaterally clear no use of accessory muscle Cardiovascular system: S1 & S2 +, No JVD,. Gastrointestinal system: Abdomen soft, NT,ND, BS+ Nervous System:Alert, awake, moving UE well, lower extremities mobility limited due to pain sensation intact bilaterally in lower extremities  Extremities: no edema, distal peripheral pulses palpable.  Skin: No rashes,no icterus. MSK: Normal muscle bulk,tone, power   Data Reviewed: I have personally reviewed following labs and imaging studies CBC: Recent Labs  Lab 09/09/20 2248 09/11/20 0532  WBC 6.2 5.3  NEUTROABS 5.3  --   HGB 10.4* 9.9*  HCT 32.9* 31.7*  MCV 94.5 95.2  PLT 394 [id number]   Basic Metabolic Panel: Recent Labs  Lab 09/09/20 2248 09/11/20 0532  NA 139 144  K 3.6 3.4*  CL 105 108  CO2 25 28  GLUCOSE 126* 88  BUN 25* 19  CREATININE 0.58 0.53  CALCIUM 9.3 9.2   GFR: Estimated Creatinine Clearance: 60.5 mL/min (by C-G formula based on SCr of 0.53 mg/dL). Liver Function Tests: No results for input(s): AST, ALT, ALKPHOS, BILITOT, PROT, ALBUMIN in the last 168 hours. No results for input(s): LIPASE, AMYLASE in the last 168 hours. No results for input(s): AMMONIA in the last 168 hours. Coagulation Profile: No results for input(s): INR, PROTIME in the last 168 hours. Cardiac Enzymes: No results for input(s): CKTOTAL, CKMB, CKMBINDEX, TROPONINI in the last 168 hours. BNP (last 3 results) No results for input(s): PROBNP in the last 8760 hours. HbA1C: No results for input(s): HGBA1C in the last 72 hours. CBG: No results for input(s): GLUCAP in the last 168 hours. Lipid Profile: No results for input(s): CHOL, HDL, LDLCALC, TRIG, CHOLHDL, LDLDIRECT in the last 72  hours. Thyroid Function Tests: No results for input(s): TSH, T4TOTAL, FREET4, T3FREE, THYROIDAB in the last 72 hours. Anemia Panel: No results for input(s): VITAMINB12, FOLATE, FERRITIN, TIBC, IRON, RETICCTPCT in the last 72 hours. Sepsis Labs: No results for input(s): PROCALCITON, LATICACIDVEN in the last 168 hours.  Recent Results (from the past 240 hour(s))  Resp Panel by RT-PCR (Flu A&B, Covid) Nasopharyngeal Swab     Status: None   Collection Time: 09/09/20 10:22 PM   Specimen: Nasopharyngeal Swab; Nasopharyngeal(NP) swabs in vial transport medium  Result Value Ref Range Status   SARS Coronavirus 2 by RT PCR NEGATIVE NEGATIVE Final    Comment: (NOTE) SARS-CoV-2 target nucleic acids are NOT DETECTED.  The SARS-CoV-2 RNA is generally detectable in upper respiratory specimens during the acute phase of infection. The lowest concentration of SARS-CoV-2 viral copies this assay can detect is 138 copies/mL. A negative result does not preclude SARS-Cov-2 infection and should not be used as the sole basis for treatment or other patient management decisions. A negative result may occur with  improper specimen collection/handling, submission of specimen other than nasopharyngeal swab, presence of viral mutation(s) within the areas targeted by this assay, and inadequate number of viral copies(<138 copies/mL). A negative result must be combined with clinical observations, patient history, and epidemiological information. The expected result is Negative.  Fact Sheet for Patients:  EntrepreneurPulse.com.au  Fact Sheet for Healthcare Providers:  IncredibleEmployment.be  This test is no t yet approved or cleared by the [place] FDA and  has been authorized for detection and/or diagnosis of SARS-CoV-2 by FDA under an Emergency Use Authorization (EUA). This EUA will remain  in effect (meaning this test can be used) for the duration of the COVID-19  declaration under Section 564(b)(1) of the Act, 21 U.S.C.section 360bbb-3(b)(1), unless the authorization is terminated  or revoked sooner.       Influenza A by PCR NEGATIVE NEGATIVE Final   Influenza B by PCR NEGATIVE NEGATIVE Final    Comment: (NOTE) The Xpert Xpress SARS-CoV-2/FLU/RSV plus assay is intended as an aid in the diagnosis of influenza from Nasopharyngeal swab specimens and should not be used as a sole basis for treatment. Nasal washings and aspirates are unacceptable for Xpert Xpress SARS-CoV-2/FLU/RSV testing.  Fact  Sheet for Patients: EntrepreneurPulse.com.au  Fact Sheet for Healthcare Providers: IncredibleEmployment.be  This test is not yet approved or cleared by the [place] FDA and has been authorized for detection and/or diagnosis of SARS-CoV-2 by FDA under an Emergency Use Authorization (EUA). This EUA will remain in effect (meaning this test can be used) for the duration of the COVID-19 declaration under Section 564(b)(1) of the Act, 21 U.S.C. section 360bbb-3(b)(1), unless the authorization is terminated or revoked.  Performed at [facility], [place] [address], [place], [place] [address]      Radiology Studies: DG Pelvis 1-2 Views  Result Date: 09/09/2020 CLINICAL DATA:  Fall on 09/05/2020 with known femur fracture and pelvic pain, initial encounter EXAM: PELVIS - 1 VIEW COMPARISON:  None. FINDINGS: Pelvic ring is intact. Degenerative changes of lumbar spine and hip joints are seen. No acute fracture or dislocation is noted. IMPRESSION: Degenerative change without acute abnormality. Electronically Signed   By: Inez Catalina M.D.   On: 09/09/2020 23:09   DG Knee 1-2 Views Right  Result Date: 09/10/2020 CLINICAL DATA:  Periprosthetic distal femoral fracture, inability to bear weight EXAM: RIGHT KNEE - 1-2 VIEW COMPARISON:  09/05/2020 FINDINGS: Frontal and cross-table lateral views of the right knee  are obtained. The minimally displaced periprosthetic fracture involving the medial femoral condyle is again noted and unchanged. The right knee arthroplasty is in stable position. Small joint effusion. Progressive subcutaneous edema. IMPRESSION: 1. Stable periprosthetic fracture of the medial femoral condyle. 2. Increasing soft tissue edema. 3. Small joint effusion. Electronically Signed   By: Randa Ngo M.D.   On: 09/10/2020 18:18   MR LUMBAR SPINE WO CONTRAST  Result Date: 09/09/2020 CLINICAL DATA:  Low back pain for greater than 6 weeks. Recent fall. EXAM: MRI LUMBAR SPINE WITHOUT CONTRAST TECHNIQUE: Multiplanar, multisequence MR imaging of the lumbar spine was performed. No intravenous contrast was administered. COMPARISON:  Radiography 06/28/2020 FINDINGS: Segmentation:  5 lumbar type vertebral bodies. Alignment: 3 mm degenerative anterolisthesis L3-4. 9 mm degenerative anterolisthesis L4-5. 5 mm degenerative anterolisthesis L5-S1. one or 2 mm of retrolisthesis at T12-L1, L1-2 and L2-3. Vertebrae:  No fracture or focal bone lesion. Conus medullaris and cauda equina: Conus extends to the L1 level. Conus and cauda equina appear normal. Paraspinal and other soft tissues: Negative Disc levels: Mild bulging of the discs at T11-12, T12-L1, L1-2 and L2-3. No compressive stenosis of the canal or foramina. L3-4: Facet arthropathy with 3 mm of anterolisthesis. Bulging of the disc. Stenosis of the lateral recesses and neural foramina that could possibly be symptomatic. Facet arthropathy would likely contribute to back pain. L4-5: Facet arthropathy with 9 mm of anterolisthesis. Pseudo disc herniation. Severe spinal stenosis likely to cause neural compression on both sides. L5-S1: Facet arthropathy with 5 mm of anterolisthesis. Pseudo disc herniation. Stenosis of the subarticular lateral recesses and of the intervertebral foramen on the left. Neural compression is possible at this level, particularly affecting the  left L5 nerve. IMPRESSION: L4-5: Advanced facet arthropathy with degenerative anterolisthesis of 9 mm. Severe multifactorial spinal stenosis which could cause neural compression in the central canal as well as in both neural foramina. L5-S1: Facet arthropathy with degenerative anterolisthesis of 5 mm. Narrowing of the subarticular lateral recesses and of the intervertebral foramen on the left. Left L5 nerve could be compressed. L3-4: Facet arthropathy with 3 mm of anterolisthesis. Mild stenosis of the lateral recesses and neural foramina could possibly be symptomatic. Obviously, the facet arthropathy at these levels could contribute  to low back pain. Electronically Signed   By: Nelson Chimes M.D.   On: 09/09/2020 20:26     LOS: 1 day   Antonieta Pert, MD [facility]  09/11/2020, 10:41 AM

## 2020-09-12 MED ORDER — PREDNISONE 10 MG (21) PO TBPK
10.0000 mg | ORAL_TABLET | Freq: Three times a day (TID) | ORAL | Status: AC
  Administered 2020-09-13 (×3): 10 mg via ORAL
  Filled 2020-09-12: qty 21

## 2020-09-12 MED ORDER — PREDNISONE 10 MG (21) PO TBPK
10.0000 mg | ORAL_TABLET | Freq: Four times a day (QID) | ORAL | Status: DC
Start: 2020-09-14 — End: 2020-09-16
  Administered 2020-09-14 (×4): 10 mg via ORAL
  Filled 2020-09-12: qty 21

## 2020-09-12 MED ORDER — VITAMIN E 180 MG (400 UNIT) PO CAPS
400.0000 [IU] | ORAL_CAPSULE | Freq: Every day | ORAL | Status: DC
  Administered 2020-09-13 – 2020-09-17 (×4): 400 [IU] via ORAL
  Filled 2020-09-12 (×5): qty 1

## 2020-09-12 MED ORDER — PREDNISONE 10 MG (21) PO TBPK
20.0000 mg | ORAL_TABLET | Freq: Every morning | ORAL | Status: AC
  Administered 2020-09-12: 20 mg via ORAL
  Filled 2020-09-12: qty 21

## 2020-09-12 MED ORDER — PREDNISONE 10 MG (21) PO TBPK
20.0000 mg | ORAL_TABLET | Freq: Every evening | ORAL | Status: AC
  Administered 2020-09-12: 20 mg via ORAL

## 2020-09-12 MED ORDER — PREDNISONE 10 MG (21) PO TBPK
20.0000 mg | ORAL_TABLET | Freq: Every evening | ORAL | Status: AC
  Administered 2020-09-13: 20 mg via ORAL

## 2020-09-12 MED ORDER — PREDNISONE 10 MG (21) PO TBPK
10.0000 mg | ORAL_TABLET | ORAL | Status: AC
  Administered 2020-09-12: 10 mg via ORAL

## 2020-09-12 MED ORDER — ALUM & MAG HYDROXIDE-SIMETH 200-200-20 MG/5ML PO SUSP
30.0000 mL | ORAL | Status: DC | PRN
  Administered 2020-09-12: 30 mL via ORAL
  Filled 2020-09-12: qty 30

## 2020-09-12 MED ORDER — CYCLOBENZAPRINE HCL 10 MG PO TABS
10.0000 mg | ORAL_TABLET | Freq: Three times a day (TID) | ORAL | Status: DC
  Administered 2020-09-12 – 2020-09-17 (×14): 10 mg via ORAL
  Filled 2020-09-12 (×14): qty 1

## 2020-09-12 NOTE — Progress Notes (Signed)
PROGRESS NOTE    Haley Roberts  [id number] DOB: Jul 23, 1945 DOA: 09/09/2020 PCP: Binnie Rail, MD   Chief Complaint  Patient presents with   Back Pain   Hip Pain   Brief Narrative: As per admitting "75 y.o. female with medical history significant for chronic back pain on chronic opioid, rheumatoid arthritis on chronic prednisone, fibromyalgia, and hypertension recent ED visit on 8/21 for a fall and found to have nondisplaced periprosthetic fracture of the right medial femoral condyle and right foot avulsion fracture and discharged with instruction for outpatient orthopedic follow-up, comes back to the ED with worsening lower back pain.   Patient reported that for the past several months she has had gradual worsening of her lower back pain.  This became more unbearable in the past week. Pain is in her lower lumbar area and radiates to bilateral buttocks.  Had some numbness and tingling before but now it is mostly pain.  She normally takes 10 mg Norco prescribed by her rheumatologist for pain number feels it is less and less effective.  Denies any saddle anesthesia.  No urinary or bowel incontinence.  She had planned appointment with Dr. Vertell Limber of neurosurgery and spine 8/25 but was not able to make it to her appointment due to increased pain. In ED:she was afebrile, normotensive on room air in a hallway bed.  CBC negative for leukocytosis.  Has chronic anemia 10.4.  BMP otherwise unremarkable. MRI of the lumbar spine showing facet arthropathy and degenerative anterior listhesis of multiple levels.ED physician did discuss with on-call neurosurgery Dr Marcello Moores who recommended her to follow-up outpatient and patient was admitted for pain control.   Subjective: Seen and examined this morning. Son at the bedside.  Patient complains of ongoing pain worse with moving her extremities pain is on her hips mostly and on low back, rt knee area and rt foot. Took morphine iv last pm and thsi am, on  percocet 10 mg q4hr prn and robaxin  Assessment & Plan:  Intractable low back pain w/ sciatica radiation to thigh Chronic low back pain for months MRI of LS done in ED- showed advanced facet arthropathy with joint degenerative changes, mild stenosis at multiple levels-neurosurgeon Dr Marcello Moores was consulted in the ED and advised no acute neurosurgical intervention and OP follow up.Continue pain management.  At home on norco 10  mg for long term- cont  IV morphine 2 mg q3h prn,Percocet 10 mg q4hr prn,lidocaine patch, muscle relaxant, local heat, PT/OT as tolerated and Prednisone 20 mg ( normally on 5 mg) alogn with stool softeners.  I consulted Dr. Christella Noa from neurosurgery and discussed this morning. pt wanting to talk to neurosurgery team while here, (as she was scheduled for possible cortisone inj for her pain with Dr Vertell Limber), also she is having ongoing intractable pain. Dr Christella Noa mentioned that he has a procedure today and he will visit her in evening.  Rheumatoid arthritis/fibromyalgia on chronic prednisone 5 mg, leflunomide  Hypertension: BP acceptable continue HCTZ and losartan  Depression: continue bupropion and Lexapro  Fall abt 2 wks PTA with non displaced right periprosthetic fracture of the medial femoral condyle with right foot avulsion fractures of the anterior calcaneus: From fall -had  ED visit 8/21 continue outpatient follow-up continue pain control.  Continue knee immobilizer, follow-up  w/ Dr Sylvan Cheese saw her this morning.    Anemia, appears chronic: OP f/u.  Mild hypokalemia-replaced  Diet Order             Diet  Heart Room service appropriate? Yes; Fluid consistency: Thin  Diet effective now                   Patient's Body mass index is 29.06 kg/m.  DVT prophylaxis: enoxaparin (LOVENOX) injection 40 mg Start: 09/10/20 1000 Code Status:   Code Status: Full Code  Family Communication: plan of care discussed with patient ( son updated 8/27) Status is: Inpatient   Remains inpatient for ongoing management of intractable low back pain with IV opiates, PT OT Dispo: The patient is from: Home              Anticipated d/c is to:  SNF vs [facility]              Patient currently is not medically stable to d/c.   Difficult to place patient No  Unresulted Labs (From admission, onward)    None       Medications reviewed:  Scheduled Meds:  vitamin C  500 mg Oral Daily   buPROPion  300 mg Oral Daily   cyclobenzaprine  10 mg Oral TID   docusate sodium  100 mg Oral Daily   enoxaparin (LOVENOX) injection  40 mg Subcutaneous Q24H   escitalopram  20 mg Oral Daily   hydrochlorothiazide  12.5 mg Oral Daily   leflunomide  10 mg Oral Daily   lidocaine  1 patch Transdermal Q24H   losartan  100 mg Oral Daily   predniSONE  10 mg Oral PC lunch   predniSONE  10 mg Oral PC supper   [START ON 09/13/2020] predniSONE  10 mg Oral 3 x daily with food   [START ON 09/14/2020] predniSONE  10 mg Oral 4X daily taper   predniSONE  20 mg Oral AC breakfast   predniSONE  20 mg Oral Nightly   [START ON 09/13/2020] predniSONE  20 mg Oral Nightly   vitamin E  400 Units Oral Daily   Continuous Infusions: Consultants:see note  Procedures:see note Antimicrobials: Anti-infectives (From admission, onward)    None      Culture/Microbiology No results found for: SDES, SPECREQUEST, CULT, REPTSTATUS  Other culture-see note  Objective: Vitals: Today's Vitals   09/11/20 2040 09/11/20 2142 09/12/20 0030 09/12/20 0543  BP:  (!) 158/88    Pulse:  81    Resp:  18    Temp:  98 F (36.7 C)    TempSrc:      SpO2:  94%    Weight:      Height:      PainSc: 8   (P) 3  3     Intake/Output Summary (Last 24 hours) at 09/12/2020 1048 Last data filed at 09/12/2020 1010 Gross per 24 hour  Intake 240 ml  Output 650 ml  Net -410 ml   Filed Weights   09/10/20 1502  Weight: 74.4 kg   Weight change:   Intake/Output from previous day: 08/27 0701 - 08/28 0700 In: 180 [P.O.:180] Out:  1250 [Urine:1250] Intake/Output this shift: Total I/O In: 240 [P.O.:240] Out: -  Filed Weights   09/10/20 1502  Weight: 74.4 kg   Examination: General exam: AAOx 3, pleasant, not in distress.   HEENT:Oral mucosa moist, Ear/Nose WNL grossly, dentition normal. Respiratory system: bilaterally diminished,  no use of accessory muscle Cardiovascular system: S1 & S2 +, No JVD,. Gastrointestinal system: Abdomen soft, NT,ND, BS+ Nervous System:Alert, awake, moving UE wll, able to  bend left knee but  LE mobility limited by pain, sensation intact bilateral lower  extremities, no bowel bladder incontinence and mobility limited by pain Extremities: no edema, distal peripheral pulses palpable.  Skin: No rashes,no icterus. MSK: Normal muscle bulk,tone, power   Data Reviewed: I have personally reviewed following labs and imaging studies CBC: Recent Labs  Lab 09/09/20 2248 09/11/20 0532  WBC 6.2 5.3  NEUTROABS 5.3  --   HGB 10.4* 9.9*  HCT 32.9* 31.7*  MCV 94.5 95.2  PLT 394 [id number]   Basic Metabolic Panel: Recent Labs  Lab 09/09/20 2248 09/11/20 0532  NA 139 144  K 3.6 3.4*  CL 105 108  CO2 25 28  GLUCOSE 126* 88  BUN 25* 19  CREATININE 0.58 0.53  CALCIUM 9.3 9.2   GFR: Estimated Creatinine Clearance: 60.5 mL/min (by C-G formula based on SCr of 0.53 mg/dL). Liver Function Tests: No results for input(s): AST, ALT, ALKPHOS, BILITOT, PROT, ALBUMIN in the last 168 hours. No results for input(s): LIPASE, AMYLASE in the last 168 hours. No results for input(s): AMMONIA in the last 168 hours. Coagulation Profile: No results for input(s): INR, PROTIME in the last 168 hours. Cardiac Enzymes: No results for input(s): CKTOTAL, CKMB, CKMBINDEX, TROPONINI in the last 168 hours. BNP (last 3 results) No results for input(s): PROBNP in the last 8760 hours. HbA1C: No results for input(s): HGBA1C in the last 72 hours. CBG: No results for input(s): GLUCAP in the last 168 hours. Lipid  Profile: No results for input(s): CHOL, HDL, LDLCALC, TRIG, CHOLHDL, LDLDIRECT in the last 72 hours. Thyroid Function Tests: No results for input(s): TSH, T4TOTAL, FREET4, T3FREE, THYROIDAB in the last 72 hours. Anemia Panel: No results for input(s): VITAMINB12, FOLATE, FERRITIN, TIBC, IRON, RETICCTPCT in the last 72 hours. Sepsis Labs: No results for input(s): PROCALCITON, LATICACIDVEN in the last 168 hours.  Recent Results (from the past 240 hour(s))  Resp Panel by RT-PCR (Flu A&B, Covid) Nasopharyngeal Swab     Status: None   Collection Time: 09/09/20 10:22 PM   Specimen: Nasopharyngeal Swab; Nasopharyngeal(NP) swabs in vial transport medium  Result Value Ref Range Status   SARS Coronavirus 2 by RT PCR NEGATIVE NEGATIVE Final    Comment: (NOTE) SARS-CoV-2 target nucleic acids are NOT DETECTED.  The SARS-CoV-2 RNA is generally detectable in upper respiratory specimens during the acute phase of infection. The lowest concentration of SARS-CoV-2 viral copies this assay can detect is 138 copies/mL. A negative result does not preclude SARS-Cov-2 infection and should not be used as the sole basis for treatment or other patient management decisions. A negative result may occur with  improper specimen collection/handling, submission of specimen other than nasopharyngeal swab, presence of viral mutation(s) within the areas targeted by this assay, and inadequate number of viral copies(<138 copies/mL). A negative result must be combined with clinical observations, patient history, and epidemiological information. The expected result is Negative.  Fact Sheet for Patients:  EntrepreneurPulse.com.au  Fact Sheet for Healthcare Providers:  IncredibleEmployment.be  This test is no t yet approved or cleared by the [place] FDA and  has been authorized for detection and/or diagnosis of SARS-CoV-2 by FDA under an Emergency Use Authorization (EUA). This EUA  will remain  in effect (meaning this test can be used) for the duration of the COVID-19 declaration under Section 564(b)(1) of the Act, 21 U.S.C.section 360bbb-3(b)(1), unless the authorization is terminated  or revoked sooner.       Influenza A by PCR NEGATIVE NEGATIVE Final   Influenza B by PCR NEGATIVE NEGATIVE Final    Comment: (NOTE)  The Xpert Xpress SARS-CoV-2/FLU/RSV plus assay is intended as an aid in the diagnosis of influenza from Nasopharyngeal swab specimens and should not be used as a sole basis for treatment. Nasal washings and aspirates are unacceptable for Xpert Xpress SARS-CoV-2/FLU/RSV testing.  Fact Sheet for Patients: EntrepreneurPulse.com.au  Fact Sheet for Healthcare Providers: IncredibleEmployment.be  This test is not yet approved or cleared by the [place] FDA and has been authorized for detection and/or diagnosis of SARS-CoV-2 by FDA under an Emergency Use Authorization (EUA). This EUA will remain in effect (meaning this test can be used) for the duration of the COVID-19 declaration under Section 564(b)(1) of the Act, 21 U.S.C. section 360bbb-3(b)(1), unless the authorization is terminated or revoked.  Performed at [facility], [place] [address] [place], [place] [address]      Radiology Studies: DG Knee 1-2 Views Right  Result Date: 09/10/2020 CLINICAL DATA:  Periprosthetic distal femoral fracture, inability to bear weight EXAM: RIGHT KNEE - 1-2 VIEW COMPARISON:  09/05/2020 FINDINGS: Frontal and cross-table lateral views of the right knee are obtained. The minimally displaced periprosthetic fracture involving the medial femoral condyle is again noted and unchanged. The right knee arthroplasty is in stable position. Small joint effusion. Progressive subcutaneous edema. IMPRESSION: 1. Stable periprosthetic fracture of the medial femoral condyle. 2. Increasing soft tissue edema. 3. Small joint  effusion. Electronically Signed   By: Randa Ngo M.D.   On: 09/10/2020 18:18     LOS: 2 days   Antonieta Pert, MD [facility]  09/12/2020, 10:48 AM

## 2020-09-12 NOTE — Consult Note (Signed)
Reason for Consult:back and lower extremity pain Referring Physician: [name], Roberts is an 75 y.o. female.  HPI: whom was admitted on 8/25 for intractable low back and lower extremity pain. She, on admission was also found to have a right nondisplaced Henrene Pastor prosthetic fracture of the right medial femoral condyle, and a right foot avulsion fracture of the anterior calcaneus. The back pain has been much worse for the last 70month. She had been seen by Dr. BRolena Infanteof orthopaedics approximately 3 years ago, but Haley Roberts at that time did not feel she was in enough pain to undergo surgery. She took two falls this past week, which prompted her visit to the hospital, as she had already been to the [facility] earlier with no relief offered. I was contacted today by both Dr. RMaren Beach and the family. She was seen by Dr. BRolena Infanteon 8/26 for an inpatient consultation.  She denies bowel and or bladder dysfunction. Standing and or wAlking exacerbates the pain.   Past Medical History:  Diagnosis Date   Anemia    Anxiety    Depression    Fibromyalgia    Hyperglycemia    HYPERGLYCEMIA, FASTING 08/22/2007   Hypertension    Multiple thyroid nodules    gets ultra sound yearly on thryoid   Rheumatoid arthritis(714.0)     Past Surgical History:  Procedure Laterality Date   ABDOMINAL HYSTERECTOMY     with BSO   BREAST BIOPSY     JOINT REPLACEMENT     right knee replacement 2012   TOTAL KNEE ARTHROPLASTY Left 07/07/2014   Procedure: LEFT TOTAL KNEE ARTHROPLASTY;  Surgeon: MParalee Cancel MD;  Location: [facility];  Service: Orthopedics;  Laterality: Left;    Family History  Problem Relation Age of Onset   Cancer Mother 420      breast   Arthritis Father    Hypertension Father    Diabetes Sister    Hypertension Brother    Diabetes Son    Depression Son     Social History:  reports that she has never smoked. She has never used smokeless tobacco. She reports that she does not drink  alcohol and does not use drugs.  Allergies:  Allergies  Allergen Reactions   Infliximab Shortness Of Breath, Other (See Comments) and Hypertension    Remicade   Sulfa Antibiotics Other (See Comments)    Convulsions    Sulfacetamide Sodium-Sulfur Other (See Comments)    Convulsions   Sulfonamide Derivatives Other (See Comments)    BAD HEADACHES/CHILLS/FEVER and CONVULSIONS with Sulfa antibiotics and Sulfacetamide Sodium-Sulfur    Medications: I have reviewed the patient's current medications.  Results for orders placed or performed during the hospital encounter of 09/09/20 (from the past 48 hour(s))  CBC     Status: Abnormal   Collection Time: 09/11/20  5:32 AM  Result Value Ref Range   WBC 5.3 4.0 - 10.5 K/uL   RBC 3.33 (L) 3.87 - 5.11 MIL/uL   Hemoglobin 9.9 (L) 12.0 - 15.0 g/dL   HCT 31.7 (L) 36.0 - 46.0 %   MCV 95.2 80.0 - 100.0 fL   MCH 29.7 26.0 - 34.0 pg   MCHC 31.2 30.0 - 36.0 g/dL   RDW 14.0 11.5 - 15.5 %   Platelets 298 150 - 400 K/uL   nRBC 0.0 0.0 - 0.2 %    Comment: [facility] [address], [place] [id number] Basic metabolic panel  Status: Abnormal   Collection Time: 09/11/20  5:32 AM  Result Value Ref Range   Sodium 144 135 - 145 mmol/L   Potassium 3.4 (L) 3.5 - 5.1 mmol/L   Chloride 108 98 - 111 mmol/L   CO2 28 22 - 32 mmol/L   Glucose, Bld 88 70 - 99 mg/dL    Comment: Glucose reference range applies only to samples taken after fasting for at least 8 hours.   BUN 19 8 - 23 mg/dL   Creatinine, Ser 0.53 0.44 - 1.00 mg/dL   Calcium 9.2 8.9 - 10.3 mg/dL   GFR, Estimated >60 >60 mL/min    Comment: (NOTE) Calculated using the CKD-EPI Creatinine Equation (2021)    Anion gap 8 5 - 15    Comment: Performed at [facility], [place] [address], [place], [place] [address]    No results found.  Review of Systems  Constitutional: Negative.   HENT: Negative.    Eyes: Negative.   Respiratory:  Negative.    Cardiovascular:  Positive for leg swelling.  Gastrointestinal:  Positive for constipation.  Endocrine: Negative for polyphagia.  Genitourinary: Negative.   Musculoskeletal:  Positive for back pain and myalgias.  Skin: Negative.   Allergic/Immunologic: Negative.   Neurological:  Positive for weakness.  Hematological: Negative.   Psychiatric/Behavioral: Negative.    Blood pressure (!) 157/131, pulse 86, temperature 98 F (36.7 C), temperature source Oral, resp. rate 17, height '5\' 3"'$  (1.6 m), weight 74.4 kg, SpO2 96 %. Physical Exam Constitutional:      Appearance: She is obese.  HENT:     Head: Normocephalic and atraumatic.     Right Ear: External ear normal.     Left Ear: External ear normal.     Nose: Nose normal.     Mouth/Throat:     Mouth: Mucous membranes are moist.     Pharynx: Oropharynx is clear.  Eyes:     Extraocular Movements: Extraocular movements intact.     Conjunctiva/sclera: Conjunctivae normal.     Pupils: Pupils are equal, round, and reactive to light.  Pulmonary:     Effort: Pulmonary effort is normal.  Abdominal:     Palpations: Abdomen is soft.  Musculoskeletal:     Cervical back: Normal range of motion.  Skin:    General: Skin is warm and dry.  Neurological:     General: No focal deficit present.     Mental Status: She is alert and oriented to person, place, and time.     Cranial Nerves: Cranial nerves are intact.     Motor: Motor function is intact.     Coordination: Coordination is intact.     Comments: Numbness in feet Right leg in an immobilizer Left leg with normal strength Gait not assessed    Assessment/Plan: Looking at her films and listening to her history I believe a decompression at both L3/4, and 4/5 are indicated to relieve the stenosis, and subsequent arthrodesis needed for stabilization. I have spoken with Mrs. Krishna and one of her sons, we will try to schedule this for this week. Risks and benefits including but not  limited to bleeding infection, no pain relief, damage to the nerves, weakness in one or both lower extremities, fusion failure, hardware failure, bowel and or bladder dysfunction, and other risks were discussed. She understands as does her son. We went over the MRI in [facility] room at [facility]. I will schedule for this coming week.   Ashok Pall 09/12/2020,  8:27 PM

## 2020-09-12 NOTE — Progress Notes (Signed)
Orthopedic Tech Progress Note Patient Details:  Haley Roberts [birth year] [id number]  Patient ID: [name], female   DOB: 1945/09/09, 75 y.o.   MRN: [id number]  Kennis Carina 09/12/2020, 11:27 AM Knee immobilizer applied to right leg

## 2020-09-12 NOTE — Progress Notes (Signed)
   Subjective:    Patient reports pain as moderate.  Patient seen in rounds with Dr. Alvan Dame. She reports she slipped and fell over a week ago and believes this is when she hurt her knee. In terms of her back, she has been trying to get in to see Dr. Vertell Limber but has not been able to yet. Her husband has ALS, and her family is caring for him now. She feels that she would not be able to ambulate with her current leg pain.    Objective: Vital signs in last 24 hours: Temp:  [97.9 F (36.6 C)-98.5 F (36.9 C)] 98 F (36.7 C) (08/27 2142) Pulse Rate:  [81-89] 81 (08/27 2142) Resp:  [18] 18 (08/27 2142) BP: (143-158)/(86-88) 158/88 (08/27 2142) SpO2:  [94 %-95 %] 94 % (08/27 2142)  Intake/Output from previous day:  Intake/Output Summary (Last 24 hours) at 09/12/2020 0755 Last data filed at 09/12/2020 0200 Gross per 24 hour  Intake 180 ml  Output 1250 ml  Net -1070 ml     Intake/Output this shift: No intake/output data recorded.  Labs: Recent Labs    09/09/20 2248 09/11/20 0532  HGB 10.4* 9.9*   Recent Labs    09/09/20 2248 09/11/20 0532  WBC 6.2 5.3  RBC 3.48* 3.33*  HCT 32.9* 31.7*  PLT 394 298   Recent Labs    09/09/20 2248 09/11/20 0532  NA 139 144  K 3.6 3.4*  CL 105 108  CO2 25 28  BUN 25* 19  CREATININE 0.58 0.53  GLUCOSE 126* 88  CALCIUM 9.3 9.2   No results for input(s): LABPT, INR in the last 72 hours.  Exam: General - Patient is Alert and Oriented Extremity - Neurologically intact Sensation intact distally Intact pulses distally Dorsiflexion/Plantar flexion intact Motor Function - intact, moving foot and toes well on exam.   Past Medical History:  Diagnosis Date   Anemia    Anxiety    Depression    Fibromyalgia    Hyperglycemia    HYPERGLYCEMIA, FASTING 08/22/2007   Hypertension    Multiple thyroid nodules    gets ultra sound yearly on thryoid   Rheumatoid arthritis(714.0)     Assessment/Plan:    Principal Problem:   Intractable low  back pain Active Problems:   Depression   Essential hypertension   Rheumatoid arthritis (HCC)   Uncontrolled pain  Estimated body mass index is 29.06 kg/m as calculated from the following:   Height as of this encounter: '5\' 3"'$  (1.6 m).   Weight as of this encounter: 74.4 kg. Advance diet Up with therapy  DVT Prophylaxis - Lovenox  NWB RLE Will have ortho tech place knee immobilizer May undo it while in bed to apply ice to the knee.   In terms of her right knee nondisplaced medial femoral condyle fracture, this will be managed non-operatively with knee immobilizer and NWB.   We will increase prednisone to 60 mg today with plans to taper back to 20 mg daily. Switched to cyclobenzaprine. Plan to get up with PT as able.   Follow up with Dr. Alvan Dame outpatient in 2 weeks for recheck of right knee.   Griffith Citron, PA-C Orthopedic Surgery [phone] 09/12/2020, 7:55 AM

## 2020-09-13 ENCOUNTER — Other Ambulatory Visit: Payer: Self-pay | Admitting: Neurosurgery

## 2020-09-13 LAB — BASIC METABOLIC PANEL
Anion gap: 7 (ref 5–15)
BUN: 18 mg/dL (ref 8–23)
CO2: 29 mmol/L (ref 22–32)
Calcium: 9.3 mg/dL (ref 8.9–10.3)
Chloride: 103 mmol/L (ref 98–111)
Creatinine, Ser: 0.62 mg/dL (ref 0.44–1.00)
GFR, Estimated: >60 mL/min (ref >60)
Glucose, Bld: 134 mg/dL — ABNORMAL HIGH (ref 70–99)
Potassium: 4.2 mmol/L (ref 3.5–5.1)
Sodium: 139 mmol/L (ref 135–145)

## 2020-09-13 MED ORDER — HYDROCHLOROTHIAZIDE 25 MG PO TABS
25.0000 mg | ORAL_TABLET | Freq: Every day | ORAL | Status: DC
  Administered 2020-09-13 – 2020-09-17 (×5): 25 mg via ORAL
  Filled 2020-09-13 (×5): qty 1

## 2020-09-13 NOTE — Progress Notes (Addendum)
PROGRESS NOTE    Haley Roberts  [id number] DOB: 1945-02-14 DOA: 09/09/2020 PCP: Binnie Rail, MD   Chief Complaint  Patient presents with   Back Pain   Hip Pain   Brief Narrative: As per admitting "75 y.o. female with medical history significant for chronic back pain on chronic opioid, rheumatoid arthritis on chronic prednisone, fibromyalgia, and hypertension recent ED visit on 8/21 for a fall and found to have nondisplaced periprosthetic fracture of the right medial femoral condyle and right foot avulsion fracture and discharged with instruction for outpatient orthopedic follow-up, comes back to the ED with worsening lower back pain.   Patient reported that for the past several months she has had gradual worsening of her lower back pain.  This became more unbearable in the past week. Pain is in her lower lumbar area and radiates to bilateral buttocks.  Had some numbness and tingling before but now it is mostly pain.  She normally takes 10 mg Norco prescribed by her rheumatologist for pain number feels it is less and less effective.  Denies any saddle anesthesia.  No urinary or bowel incontinence.  She had planned appointment with Dr. Vertell Limber of neurosurgery and spine 8/25 but was not able to make it to her appointment due to increased pain. In ED:she was afebrile, normotensive on room air in a hallway bed.  CBC negative for leukocytosis.  Has chronic anemia 10.4.  BMP otherwise unremarkable. MRI of the lumbar spine showing facet arthropathy and degenerative anterior listhesis of multiple levels.ED physician did discuss with on-call neurosurgery Dr Marcello Moores no acute intervention needed, pt was admitted for pain control. Patient managed with   IV morphine 2 mg q3h prn,Percocet 10 mg q4hr prn,lidocaine patch, muscle relaxant, local heat but still symptomatic and unable to get up. Dr Christella Noa consulted and saw patient and advised decompression at both L3/L4 and 4/5  Subjective: Seen and examined  this morning.  Complains of ongoing pain when she tries to move.  At rest pain is stable on current meds. Numbness on palm ongoing for months, no bowel bladder incontinence Blood pressure high overnight.  Assessment & Plan:  Intractable low back pain  Chronic low back pain for months On chronic opiates MRI of LS done in ED- showed advanced facet arthropathy with joint degenerative changes, mild stenosis at multiple levels-neurosurgeon Dr Marcello Moores was consulted in the ED advised no acute neurosurgical procedure, patient was admitted for ongoing pain management.  But patient at this time remains symptomatic unable to get up, having intractable pain, neurosurgery reconsulted and recommended decompression at both L3/4 and 4/5.  Patient will be transferred to [facility], surgery will be scheduled for this week sometime. At home on norco 10  mg for long term- cont  IV morphine 2 mg q3h prn,Percocet 10 mg q4hr prn,lidocaine patch, muscle relaxant, local heat, PT/OT as tolerated and Prednisone taper ( normally on 5 mg) Addendum: Dr. Christella Noa notified that he will be operating on coming Wednesday at [facility].  Rheumatoid arthritis/fibromyalgia on chronic prednisone 5 mg at home. cont leflunomide  Hypertension: BP poorly controlled overnight.  Increase HCTZ to 25 mg.  Continue losartan.    Depression: stable on bupropion and Lexapro  Fall abt 2 wks PTA with non displaced right periprosthetic fracture of the medial femoral condyle with right foot avulsion fractures of the anterior calcaneus: From fall -had  ED visit 8/21 continue outpatient follow-up continue pain control.  Continue knee immobilizer, follow-up  w/ Dr Sylvan Cheese saw her  her while inpatient.  Also seen by Dr. Rolena Infante.    Anemia, appears chronic: OP f/u.  Mild hypokalemia-replaced.  Improved.  Monitor while on HCTZ  Overweight with BMI 29.  Will benefit with weight loss.  Diet Order             Diet Heart Room service appropriate? Yes;  Fluid consistency: Thin  Diet effective now                   Patient's Body mass index is 29.06 kg/m.  DVT prophylaxis: enoxaparin (LOVENOX) injection 40 mg Start: 09/10/20 1000- hold after today's dose add SCD and  resume post op oncae oaky with NeruSx. Code Status:   Code Status: Full Code  Family Communication: plan of care discussed with patient ( son updated 8/28) Status is: Inpatient  Remains inpatient for ongoing management of intractable low back pain with IV opiates, PT OT Will initiate transfer to [place]: The patient is from: Home              Anticipated d/c is to: TBD              Patient currently is not medically stable to d/c.   Difficult to place patient No  Unresulted Labs (From admission, onward)    None       Medications reviewed:  Scheduled Meds:  vitamin C  500 mg Oral Daily   buPROPion  300 mg Oral Daily   cyclobenzaprine  10 mg Oral TID   docusate sodium  100 mg Oral Daily   enoxaparin (LOVENOX) injection  40 mg Subcutaneous Q24H   escitalopram  20 mg Oral Daily   hydrochlorothiazide  25 mg Oral Daily   leflunomide  10 mg Oral Daily   lidocaine  1 patch Transdermal Q24H   losartan  100 mg Oral Daily   predniSONE  10 mg Oral 3 x daily with food   [START ON 09/14/2020] predniSONE  10 mg Oral 4X daily taper   predniSONE  20 mg Oral Nightly   Vitamin E  400 Units Oral Daily   Continuous Infusions: Consultants:see note  Procedures:see note Antimicrobials: Anti-infectives (From admission, onward)    None      Culture/Microbiology No results found for: SDES, SPECREQUEST, CULT, REPTSTATUS  Other culture-see note  Objective: Vitals: Today's Vitals   09/13/20 0550 09/13/20 0603 09/13/20 0638 09/13/20 0650  BP: (!) 188/82   (!) 175/102  Pulse:      Resp: 17     Temp: 98.1 F (36.7 C)     TempSrc:      SpO2: 96%     Weight:      Height:      PainSc:  10-Worst pain ever 8      Intake/Output Summary (Last 24 hours) at  09/13/2020 0951 Last data filed at 09/13/2020 0600 Gross per 24 hour  Intake 660 ml  Output 0 ml  Net 660 ml    Filed Weights   09/10/20 1502  Weight: 74.4 kg   Weight change:   Intake/Output from previous day: 08/28 0701 - 08/29 0700 In: 660 [P.O.:660] Out: 0  Intake/Output this shift: No intake/output data recorded. Filed Weights   09/10/20 1502  Weight: 74.4 kg   Examination: General exam: AAOx 3 pleasant, not in distress.  HEENT:Oral mucosa moist, Ear/Nose WNL grossly, dentition normal. Respiratory system: bilaterally diminished, no use of accessory muscle Cardiovascular system: S1 & S2 +, No JVD,. Gastrointestinal system:  Abdomen soft,NT,ND, BS+ Nervous System:Alert, awake, moving her ankle, sensation present bilateral lower extremities unable to move lower extremities and also with pain  Extremities: no edema, distal peripheral pulses palpable.  Skin: No rashes,no icterus. MSK: Normal muscle bulk,tone, power   Data Reviewed: I have personally reviewed following labs and imaging studies CBC: Recent Labs  Lab 09/09/20 2248 09/11/20 0532  WBC 6.2 5.3  NEUTROABS 5.3  --   HGB 10.4* 9.9*  HCT 32.9* 31.7*  MCV 94.5 95.2  PLT 394 [id number]    Basic Metabolic Panel: Recent Labs  Lab 09/09/20 2248 09/11/20 0532 09/13/20 0409  NA 139 144 139  K 3.6 3.4* 4.2  CL 105 108 103  CO2 '25 28 29  '$ GLUCOSE 126* 88 134*  BUN 25* 19 18  CREATININE 0.58 0.53 0.62  CALCIUM 9.3 9.2 9.3    GFR: Estimated Creatinine Clearance: 60.5 mL/min (by C-G formula based on SCr of 0.62 mg/dL). Liver Function Tests: No results for input(s): AST, ALT, ALKPHOS, BILITOT, PROT, ALBUMIN in the last 168 hours. No results for input(s): LIPASE, AMYLASE in the last 168 hours. No results for input(s): AMMONIA in the last 168 hours. Coagulation Profile: No results for input(s): INR, PROTIME in the last 168 hours. Cardiac Enzymes: No results for input(s): CKTOTAL, CKMB, CKMBINDEX, TROPONINI in the  last 168 hours. BNP (last 3 results) No results for input(s): PROBNP in the last 8760 hours. HbA1C: No results for input(s): HGBA1C in the last 72 hours. CBG: No results for input(s): GLUCAP in the last 168 hours. Lipid Profile: No results for input(s): CHOL, HDL, LDLCALC, TRIG, CHOLHDL, LDLDIRECT in the last 72 hours. Thyroid Function Tests: No results for input(s): TSH, T4TOTAL, FREET4, T3FREE, THYROIDAB in the last 72 hours. Anemia Panel: No results for input(s): VITAMINB12, FOLATE, FERRITIN, TIBC, IRON, RETICCTPCT in the last 72 hours. Sepsis Labs: No results for input(s): PROCALCITON, LATICACIDVEN in the last 168 hours.  Recent Results (from the past 240 hour(s))  Resp Panel by RT-PCR (Flu A&B, Covid) Nasopharyngeal Swab     Status: None   Collection Time: 09/09/20 10:22 PM   Specimen: Nasopharyngeal Swab; Nasopharyngeal(NP) swabs in vial transport medium  Result Value Ref Range Status   SARS Coronavirus 2 by RT PCR NEGATIVE NEGATIVE Final    Comment: (NOTE) SARS-CoV-2 target nucleic acids are NOT DETECTED.  The SARS-CoV-2 RNA is generally detectable in upper respiratory specimens during the acute phase of infection. The lowest concentration of SARS-CoV-2 viral copies this assay can detect is 138 copies/mL. A negative result does not preclude SARS-Cov-2 infection and should not be used as the sole basis for treatment or other patient management decisions. A negative result may occur with  improper specimen collection/handling, submission of specimen other than nasopharyngeal swab, presence of viral mutation(s) within the areas targeted by this assay, and inadequate number of viral copies(<138 copies/mL). A negative result must be combined with clinical observations, patient history, and epidemiological information. The expected result is Negative.  Fact Sheet for Patients:  EntrepreneurPulse.com.au  Fact Sheet for Healthcare Providers:   IncredibleEmployment.be  This test is no t yet approved or cleared by the [place] FDA and  has been authorized for detection and/or diagnosis of SARS-CoV-2 by FDA under an Emergency Use Authorization (EUA). This EUA will remain  in effect (meaning this test can be used) for the duration of the COVID-19 declaration under Section 564(b)(1) of the Act, 21 U.S.C.section 360bbb-3(b)(1), unless the authorization is terminated  or revoked sooner.  Influenza A by PCR NEGATIVE NEGATIVE Final   Influenza B by PCR NEGATIVE NEGATIVE Final    Comment: (NOTE) The Xpert Xpress SARS-CoV-2/FLU/RSV plus assay is intended as an aid in the diagnosis of influenza from Nasopharyngeal swab specimens and should not be used as a sole basis for treatment. Nasal washings and aspirates are unacceptable for Xpert Xpress SARS-CoV-2/FLU/RSV testing.  Fact Sheet for Patients: EntrepreneurPulse.com.au  Fact Sheet for Healthcare Providers: IncredibleEmployment.be  This test is not yet approved or cleared by the [place] FDA and has been authorized for detection and/or diagnosis of SARS-CoV-2 by FDA under an Emergency Use Authorization (EUA). This EUA will remain in effect (meaning this test can be used) for the duration of the COVID-19 declaration under Section 564(b)(1) of the Act, 21 U.S.C. section 360bbb-3(b)(1), unless the authorization is terminated or revoked.  Performed at [facility], [place] [address], [place], [place] [address]     Radiology Studies: No results found.   LOS: 3 days   Antonieta Pert, MD [facility]  09/13/2020, 9:51 AM

## 2020-09-13 NOTE — Progress Notes (Signed)
NP Blount was paged about BP

## 2020-09-13 NOTE — Progress Notes (Signed)
Patient ID: Haley Roberts, female   DOB: 09-12-1945, 75 y.o.   MRN: [id number] BP (!) 158/81 (BP Location: Left Arm)   Pulse (!) 103   Temp 98.2 F (36.8 C) (Oral)   Resp 18   Ht '5\' 3"'$  (1.6 m)   Wt 74.4 kg   SpO2 95%   BMI 29.06 kg/m  OR planned for Wednesday.

## 2020-09-13 NOTE — Progress Notes (Signed)
PHYSICAL THERAPY  Pt plans to have back surgery this week.  Pt is not getting EOB even to [facility] due to intense pain.  Per Neurology PN 09/12/20 "I believe a decompression at both L3/4, and 4/5 are indicated to relieve the stenosis, and subsequent arthrodesis needed for stabilization.  I will schedule for this coming week. "  Will discuss situation with LPT.  Most likely will holld off Physical Therapy until after her back surgery.  Pt stated "they are doing my surgery at [facility]."  Rica Koyanagi  PTA [place] Pager      [phone] Office      [phone]

## 2020-09-14 LAB — SURGICAL PCR SCREEN
MRSA, PCR: NEGATIVE
Staphylococcus aureus: POSITIVE — AB

## 2020-09-14 MED ORDER — CHLORHEXIDINE GLUCONATE CLOTH 2 % EX PADS
6.0000 | MEDICATED_PAD | Freq: Once | CUTANEOUS | Status: AC
  Administered 2020-09-14: 6 via TOPICAL

## 2020-09-14 MED ORDER — CEFAZOLIN SODIUM-DEXTROSE 2-4 GM/100ML-% IV SOLN
2.0000 g | INTRAVENOUS | Status: DC
  Filled 2020-09-14: qty 100

## 2020-09-14 MED ORDER — POLYETHYLENE GLYCOL 3350 17 G PO PACK
17.0000 g | PACK | Freq: Every day | ORAL | Status: DC
  Administered 2020-09-14 – 2020-09-15 (×2): 17 g via ORAL
  Filled 2020-09-14 (×2): qty 1

## 2020-09-14 MED ORDER — CHLORHEXIDINE GLUCONATE CLOTH 2 % EX PADS: 6.0000 | MEDICATED_PAD | Freq: Once | CUTANEOUS | Status: AC

## 2020-09-14 MED ORDER — BISACODYL 10 MG RE SUPP
10.0000 mg | Freq: Once | RECTAL | Status: AC
  Administered 2020-09-14: 10 mg via RECTAL
  Filled 2020-09-14: qty 1

## 2020-09-14 NOTE — Progress Notes (Signed)
Patient ID: Haley Roberts, female   DOB: January 31, 1945, 76 y.o.   MRN: [id number] BP 138/90 (BP Location: Left Arm)   Pulse (!) 106   Temp 98.3 F (36.8 C) (Oral)   Resp 17   Ht '5\' 3"'$  (1.6 m)   Wt 74.4 kg   SpO2 94%   BMI 29.06 kg/m  Alert and oriented x 4, speech is clear and fluent Moving lower extremities' Plan for OR tomorrow.

## 2020-09-14 NOTE — Progress Notes (Signed)
PROGRESS NOTE    Haley Roberts  [id number] DOB: 18-May-1945 DOA: 09/09/2020 PCP: Binnie Rail, MD   Chief Complaint  Patient presents with   Back Pain   Hip Pain   Brief Narrative: As per admitting "75 y.o. female with medical history significant for chronic back pain on chronic opioid, rheumatoid arthritis on chronic prednisone, fibromyalgia, and hypertension recent ED visit on 8/21 for a fall and found to have nondisplaced periprosthetic fracture of the right medial femoral condyle and right foot avulsion fracture and discharged with instruction for outpatient orthopedic follow-up, comes back to the ED with worsening lower back pain.   Patient reported that for the past several months she has had gradual worsening of her lower back pain.  This became more unbearable in the past week. Pain is in her lower lumbar area and radiates to bilateral buttocks.  Had some numbness and tingling before but now it is mostly pain.  She normally takes 10 mg Norco prescribed by her rheumatologist for pain number feels it is less and less effective.  Denies any saddle anesthesia.  No urinary or bowel incontinence.  She had planned appointment with Dr. Vertell Limber of neurosurgery and spine 8/25 but was not able to make it to her appointment due to increased pain. In ED: she was afebrile, normotensive on room air in a hallway bed.  CBC negative for leukocytosis.  Has chronic anemia 10.4.  BMP otherwise unremarkable. MRI of the lumbar spine showing facet arthropathy and degenerative anterior listhesis of multiple levels.ED physician did discuss with on-call neurosurgery Dr Marcello Moores no acute intervention needed, pt was admitted for pain control. Patient managed with   IV morphine 2 mg q3h prn,Percocet 10 mg q4hr prn,lidocaine patch, muscle relaxant, local heat but still symptomatic and unable to get up. Dr Christella Noa consulted and saw patient and advised decompression at both L3/L4 and 4/5 and planning on  8/31  Subjective: Pain controlled with meds. C/o constipation No new numbness or weakness on legs. No new complaints Awaiting [facility] transfer for OR tomorrow  Assessment & Plan:  Intractable low back pain  Chronic low back pain for months On chronic opiates MRI of LS done in ED- showed advanced facet arthropathy with joint degenerative changes, mild stenosis at multiple levels-neurosurgeon Dr Marcello Moores was consulted in the ED advised no acute neurosurgical procedure, patient was admitted for ongoing pain management.  But patient at this time remains symptomatic unable to get up, having intractable pain, neurosurgery reconsulted and recommended decompression at both L3/4 and 4/5.  Neurosurgery planning for operative intervention 8/31st.  Patient waiting for transfer to [facility] since 8/29.  Spoke with Furniture conservator/restorer awaiting on bed.  Continue current oral and IV opiates.  Morphine and Percocet lidocaine patch muscle relaxant heat pad.At home on norco 10  mg for long term-. She is on Prednisone taper ( normally on 5 mg)  Rheumatoid arthritis/fibromyalgia on chronic prednisone 5 mg at home. cont leflunomide  Hypertension: BP poorly fairly controlled after increasing HCTZ from 12.5 mg to  '25mg'$ . Cont losartan.  Depression: stable on bupropion and Lexapro  Fall abt 2 wks PTA with non displaced right periprosthetic fracture of the medial femoral condyle with right foot avulsion fractures of the anterior calcaneus: From fall -had  ED visit 8/21 continue outpatient follow-up continue pain control.  Continue knee immobilizer, follow-up  w/ Dr Sylvan Cheese saw her her while inpatient.  Also seen by Dr. Rolena Infante.    Anemia, appears chronic: OP f/u.hh stable Recent  Labs  Lab 09/09/20 2248 09/11/20 0532  HGB 10.4* 9.9*  HCT 32.9* 31.7*    Mild hypokalemia-replaced and resolved.Monitor while on HCTZ  Overweight with BMI 29.  Will benefit with weight loss.  Diet Order             Diet Heart Room service  appropriate? Yes; Fluid consistency: Thin  Diet effective now                   Patient's Body mass index is 29.06 kg/m.  DVT prophylaxis: Place and maintain sequential compression device Start: 09/13/20 1151- lovenox last dose 8/29-resume post op once okay with  Neurosx. Code Status:   Code Status: Full Code  Family Communication: plan of care discussed with patient ( son updated 8/28) Status is: Inpatient  Remains inpatient for ongoing management of intractable low back pain with IV opiates, PT OT Will initiate transfer to [place]: The patient is from: Home              Anticipated d/c is to: Waiting for transfer to [facility] for lumbar decompression              Patient currently is not medically stable to d/c.   Difficult to place patient No  Unresulted Labs (From admission, onward)    None       Medications reviewed:  Scheduled Meds:  vitamin C  500 mg Oral Daily   buPROPion  300 mg Oral Daily   cyclobenzaprine  10 mg Oral TID   docusate sodium  100 mg Oral Daily   escitalopram  20 mg Oral Daily   hydrochlorothiazide  25 mg Oral Daily   leflunomide  10 mg Oral Daily   lidocaine  1 patch Transdermal Q24H   losartan  100 mg Oral Daily   predniSONE  10 mg Oral 4X daily taper   Vitamin E  400 Units Oral Daily   Continuous Infusions: Consultants:see note  Procedures:see note Antimicrobials: Anti-infectives (From admission, onward)    None      Culture/Microbiology No results found for: SDES, SPECREQUEST, CULT, REPTSTATUS  Other culture-see note  Objective: Vitals: Today's Vitals   09/13/20 2121 09/13/20 2207 09/14/20 0516 09/14/20 0619  BP:   (!) 154/88   Pulse:   82   Resp:   16   Temp:   97.9 F (36.6 C)   TempSrc:   Oral   SpO2:   96%   Weight:      Height:      PainSc: 10-Worst pain ever Asleep  9     Intake/Output Summary (Last 24 hours) at 09/14/2020 0826 Last data filed at 09/14/2020 0600 Gross per 24 hour  Intake 840 ml  Output 400  ml  Net 440 ml    Filed Weights   09/10/20 1502  Weight: 74.4 kg   Weight change:   Intake/Output from previous day: 08/29 0701 - 08/30 0700 In: 840 [P.O.:840] Out: 400 [Urine:400] Intake/Output this shift: No intake/output data recorded. Filed Weights   09/10/20 1502  Weight: 74.4 kg   Examination: General exam: AAOx 3, pleasant, not in distress.  HEENT:Oral mucosa moist, Ear/Nose WNL grossly, dentition normal. Respiratory system: bilaterally clear, no use of accessory muscle Cardiovascular system: S1 & S2 +, No JVD,. Gastrointestinal system: Abdomen soft,NT,ND, BS+ Nervous System:Alert, awake, moving ankles and movement limited by pain also lower extremity weakness, sensation intact  Extremities: no edema, distal peripheral pulses palpable.  Skin: No rashes,no icterus.  MSK: Normal muscle bulk,tone, power   Data Reviewed: I have personally reviewed following labs and imaging studies CBC: Recent Labs  Lab 09/09/20 2248 09/11/20 0532  WBC 6.2 5.3  NEUTROABS 5.3  --   HGB 10.4* 9.9*  HCT 32.9* 31.7*  MCV 94.5 95.2  PLT 394 [id number]    Basic Metabolic Panel: Recent Labs  Lab 09/09/20 2248 09/11/20 0532 09/13/20 0409  NA 139 144 139  K 3.6 3.4* 4.2  CL 105 108 103  CO2 '25 28 29  '$ GLUCOSE 126* 88 134*  BUN 25* 19 18  CREATININE 0.58 0.53 0.62  CALCIUM 9.3 9.2 9.3    GFR: Estimated Creatinine Clearance: 60.5 mL/min (by C-G formula based on SCr of 0.62 mg/dL). Liver Function Tests: No results for input(s): AST, ALT, ALKPHOS, BILITOT, PROT, ALBUMIN in the last 168 hours. No results for input(s): LIPASE, AMYLASE in the last 168 hours. No results for input(s): AMMONIA in the last 168 hours. Coagulation Profile: No results for input(s): INR, PROTIME in the last 168 hours. Cardiac Enzymes: No results for input(s): CKTOTAL, CKMB, CKMBINDEX, TROPONINI in the last 168 hours. BNP (last 3 results) No results for input(s): PROBNP in the last 8760 hours. HbA1C: No  results for input(s): HGBA1C in the last 72 hours. CBG: No results for input(s): GLUCAP in the last 168 hours. Lipid Profile: No results for input(s): CHOL, HDL, LDLCALC, TRIG, CHOLHDL, LDLDIRECT in the last 72 hours. Thyroid Function Tests: No results for input(s): TSH, T4TOTAL, FREET4, T3FREE, THYROIDAB in the last 72 hours. Anemia Panel: No results for input(s): VITAMINB12, FOLATE, FERRITIN, TIBC, IRON, RETICCTPCT in the last 72 hours. Sepsis Labs: No results for input(s): PROCALCITON, LATICACIDVEN in the last 168 hours.  Recent Results (from the past 240 hour(s))  Resp Panel by RT-PCR (Flu A&B, Covid) Nasopharyngeal Swab     Status: None   Collection Time: 09/09/20 10:22 PM   Specimen: Nasopharyngeal Swab; Nasopharyngeal(NP) swabs in vial transport medium  Result Value Ref Range Status   SARS Coronavirus 2 by RT PCR NEGATIVE NEGATIVE Final    Comment: (NOTE) SARS-CoV-2 target nucleic acids are NOT DETECTED.  The SARS-CoV-2 RNA is generally detectable in upper respiratory specimens during the acute phase of infection. The lowest concentration of SARS-CoV-2 viral copies this assay can detect is 138 copies/mL. A negative result does not preclude SARS-Cov-2 infection and should not be used as the sole basis for treatment or other patient management decisions. A negative result may occur with  improper specimen collection/handling, submission of specimen other than nasopharyngeal swab, presence of viral mutation(s) within the areas targeted by this assay, and inadequate number of viral copies(<138 copies/mL). A negative result must be combined with clinical observations, patient history, and epidemiological information. The expected result is Negative.  Fact Sheet for Patients:  EntrepreneurPulse.com.au  Fact Sheet for Healthcare Providers:  IncredibleEmployment.be  This test is no t yet approved or cleared by the [place] FDA and  has  been authorized for detection and/or diagnosis of SARS-CoV-2 by FDA under an Emergency Use Authorization (EUA). This EUA will remain  in effect (meaning this test can be used) for the duration of the COVID-19 declaration under Section 564(b)(1) of the Act, 21 U.S.C.section 360bbb-3(b)(1), unless the authorization is terminated  or revoked sooner.       Influenza A by PCR NEGATIVE NEGATIVE Final   Influenza B by PCR NEGATIVE NEGATIVE Final    Comment: (NOTE) The Xpert Xpress SARS-CoV-2/FLU/RSV plus assay is intended as an  aid in the diagnosis of influenza from Nasopharyngeal swab specimens and should not be used as a sole basis for treatment. Nasal washings and aspirates are unacceptable for Xpert Xpress SARS-CoV-2/FLU/RSV testing.  Fact Sheet for Patients: EntrepreneurPulse.com.au  Fact Sheet for Healthcare Providers: IncredibleEmployment.be  This test is not yet approved or cleared by the [place] FDA and has been authorized for detection and/or diagnosis of SARS-CoV-2 by FDA under an Emergency Use Authorization (EUA). This EUA will remain in effect (meaning this test can be used) for the duration of the COVID-19 declaration under Section 564(b)(1) of the Act, 21 U.S.C. section 360bbb-3(b)(1), unless the authorization is terminated or revoked.  Performed at [facility], [place] [address] [place], [place] [address]     Radiology Studies: No results found.   LOS: 4 days   Antonieta Pert, MD [facility]  09/14/2020, 8:26 AM

## 2020-09-14 NOTE — Progress Notes (Signed)
Physical Therapy Discharge Patient Details Name: Haley Roberts MRN: [id number] DOB: Feb 28, 1945 Today's Date: 09/14/2020 Time:  -     Patient discharged from PT services secondary to surgery - will need to re-order PT to resume therapy services.  Please see latest therapy progress note for current level of functioning and progress toward goals.       GP     Claretha Cooper 09/14/2020, 6:40 AM  [place] Pager [phone] Office [phone]

## 2020-09-15 ENCOUNTER — Inpatient Hospital Stay (HOSPITAL_COMMUNITY): Payer: Medicare Other

## 2020-09-15 ENCOUNTER — Inpatient Hospital Stay (HOSPITAL_COMMUNITY): Payer: Medicare Other | Admitting: Certified Registered Nurse Anesthetist

## 2020-09-15 ENCOUNTER — Encounter (HOSPITAL_COMMUNITY)
Admission: EM | Disposition: A | Discharge status: Rehabilitation Facility | Payer: Self-pay | Source: Home / Self Care | Attending: Internal Medicine

## 2020-09-15 ENCOUNTER — Encounter (HOSPITAL_COMMUNITY): Payer: Self-pay | Admitting: Internal Medicine

## 2020-09-15 LAB — PREPARE RBC (CROSSMATCH)

## 2020-09-15 SURGERY — POSTERIOR LUMBAR FUSION 2 LEVEL
Anesthesia: General | Site: Back

## 2020-09-15 MED ORDER — MIDAZOLAM HCL 2 MG/2ML IJ SOLN
INTRAMUSCULAR | Status: DC | PRN
  Administered 2020-09-15: 2 mg via INTRAVENOUS

## 2020-09-15 MED ORDER — BUPIVACAINE HCL (PF) 0.5 % IJ SOLN
INTRAMUSCULAR | Status: AC
  Filled 2020-09-15: qty 30

## 2020-09-15 MED ORDER — THROMBIN 20000 UNITS EX SOLR
CUTANEOUS | Status: AC
  Filled 2020-09-15: qty 20000

## 2020-09-15 MED ORDER — CHLORHEXIDINE GLUCONATE 0.12 % MT SOLN: 15.0000 mL | Freq: Once | OROMUCOSAL | Status: DC

## 2020-09-15 MED ORDER — LACTATED RINGERS IV SOLN: INTRAVENOUS | Status: DC | PRN

## 2020-09-15 MED ORDER — ROCURONIUM BROMIDE 10 MG/ML (PF) SYRINGE
PREFILLED_SYRINGE | INTRAVENOUS | Status: AC
  Filled 2020-09-15: qty 10

## 2020-09-15 MED ORDER — ALBUMIN HUMAN 5 % IV SOLN
INTRAVENOUS | Status: DC | PRN
Start: 2020-09-15 — End: 2020-09-16

## 2020-09-15 MED ORDER — FENTANYL CITRATE (PF) 100 MCG/2ML IJ SOLN: 25.0000 ug | INTRAMUSCULAR | Status: DC | PRN

## 2020-09-15 MED ORDER — DIPHENHYDRAMINE HCL 50 MG/ML IJ SOLN
INTRAMUSCULAR | Status: DC | PRN
  Administered 2020-09-15: 12.5 mg via INTRAVENOUS

## 2020-09-15 MED ORDER — LIDOCAINE 2% (20 MG/ML) 5 ML SYRINGE
INTRAMUSCULAR | Status: DC | PRN
  Administered 2020-09-15: 20 mg via INTRAVENOUS

## 2020-09-15 MED ORDER — MIDAZOLAM HCL 2 MG/2ML IJ SOLN
INTRAMUSCULAR | Status: AC
  Filled 2020-09-15: qty 2

## 2020-09-15 MED ORDER — AMISULPRIDE (ANTIEMETIC) 5 MG/2ML IV SOLN: 10.0000 mg | Freq: Once | INTRAVENOUS | Status: DC | PRN

## 2020-09-15 MED ORDER — EPHEDRINE SULFATE-NACL 50-0.9 MG/10ML-% IV SOSY
PREFILLED_SYRINGE | INTRAVENOUS | Status: DC | PRN
  Administered 2020-09-15: 10 mg via INTRAVENOUS

## 2020-09-15 MED ORDER — SODIUM CHLORIDE 0.9 % IV SOLN: 10.0000 mL/h | Freq: Once | INTRAVENOUS | Status: DC

## 2020-09-15 MED ORDER — PROMETHAZINE HCL 25 MG/ML IJ SOLN: 6.2500 mg | INTRAMUSCULAR | Status: DC | PRN

## 2020-09-15 MED ORDER — 0.9 % SODIUM CHLORIDE (POUR BTL) OPTIME
TOPICAL | Status: DC | PRN
  Administered 2020-09-15 (×2): 1000 mL

## 2020-09-15 MED ORDER — LACTATED RINGERS IV SOLN: INTRAVENOUS | Status: DC

## 2020-09-15 MED ORDER — PROPOFOL 10 MG/ML IV BOLUS
INTRAVENOUS | Status: AC
  Filled 2020-09-15: qty 20

## 2020-09-15 MED ORDER — CHLORHEXIDINE GLUCONATE 0.12 % MT SOLN
OROMUCOSAL | Status: AC
  Administered 2020-09-15: 15 mL
  Filled 2020-09-15: qty 15

## 2020-09-15 MED ORDER — FENTANYL CITRATE (PF) 250 MCG/5ML IJ SOLN
INTRAMUSCULAR | Status: AC
  Filled 2020-09-15: qty 5

## 2020-09-15 MED ORDER — ONDANSETRON HCL 4 MG/2ML IJ SOLN
INTRAMUSCULAR | Status: AC
  Filled 2020-09-15: qty 2

## 2020-09-15 MED ORDER — THROMBIN 20000 UNITS EX SOLR
CUTANEOUS | Status: DC | PRN
  Administered 2020-09-15: 20 mL via TOPICAL

## 2020-09-15 MED ORDER — FENTANYL CITRATE (PF) 250 MCG/5ML IJ SOLN
INTRAMUSCULAR | Status: DC | PRN
  Administered 2020-09-15 (×5): 50 ug via INTRAVENOUS
  Administered 2020-09-15: 100 ug via INTRAVENOUS
  Administered 2020-09-16 (×3): 50 ug via INTRAVENOUS

## 2020-09-15 MED ORDER — PHENYLEPHRINE HCL-NACL 20-0.9 MG/250ML-% IV SOLN
INTRAVENOUS | Status: DC | PRN
  Administered 2020-09-15: 25 ug/min via INTRAVENOUS

## 2020-09-15 MED ORDER — ROCURONIUM BROMIDE 10 MG/ML (PF) SYRINGE
PREFILLED_SYRINGE | INTRAVENOUS | Status: DC | PRN
  Administered 2020-09-15: 60 mg via INTRAVENOUS
  Administered 2020-09-15 (×3): 20 mg via INTRAVENOUS
  Administered 2020-09-16: 10 mg via INTRAVENOUS

## 2020-09-15 MED ORDER — CEFAZOLIN SODIUM-DEXTROSE 2-3 GM-%(50ML) IV SOLR
INTRAVENOUS | Status: DC | PRN
  Administered 2020-09-15 (×2): 2 g via INTRAVENOUS

## 2020-09-15 MED ORDER — LIDOCAINE-EPINEPHRINE 0.5 %-1:200000 IJ SOLN
INTRAMUSCULAR | Status: AC
  Filled 2020-09-15: qty 1

## 2020-09-15 MED ORDER — PHENYLEPHRINE 40 MCG/ML (10ML) SYRINGE FOR IV PUSH (FOR BLOOD PRESSURE SUPPORT)
PREFILLED_SYRINGE | INTRAVENOUS | Status: DC | PRN
  Administered 2020-09-15 (×2): 120 ug via INTRAVENOUS
  Administered 2020-09-15: 80 ug via INTRAVENOUS
  Administered 2020-09-15 (×2): 120 ug via INTRAVENOUS

## 2020-09-15 MED ORDER — ORAL CARE MOUTH RINSE: 15.0000 mL | Freq: Once | OROMUCOSAL | Status: DC

## 2020-09-15 MED ORDER — SODIUM CHLORIDE 0.9 % IV SOLN: INTRAVENOUS | Status: DC | PRN

## 2020-09-15 MED ORDER — MUPIROCIN 2 % EX OINT
1.0000 "application " | TOPICAL_OINTMENT | Freq: Two times a day (BID) | CUTANEOUS | Status: DC
  Administered 2020-09-15 – 2020-09-17 (×5): 1 via NASAL
  Filled 2020-09-15: qty 22

## 2020-09-15 MED ORDER — PROPOFOL 10 MG/ML IV BOLUS
INTRAVENOUS | Status: DC | PRN
  Administered 2020-09-15: 150 mg via INTRAVENOUS

## 2020-09-15 MED ORDER — CHLORHEXIDINE GLUCONATE CLOTH 2 % EX PADS
6.0000 | MEDICATED_PAD | Freq: Every day | CUTANEOUS | Status: DC
  Administered 2020-09-15 – 2020-09-16 (×2): 6 via TOPICAL

## 2020-09-15 MED ORDER — DEXAMETHASONE SODIUM PHOSPHATE 10 MG/ML IJ SOLN
INTRAMUSCULAR | Status: DC | PRN
  Administered 2020-09-15: 10 mg via INTRAVENOUS

## 2020-09-15 MED ORDER — LIDOCAINE-EPINEPHRINE 0.5 %-1:200000 IJ SOLN
INTRAMUSCULAR | Status: DC | PRN
  Administered 2020-09-15: 15 mL via INTRADERMAL

## 2020-09-15 SURGICAL SUPPLY — 77 items
ADH SKN CLS APL DERMABOND .7 (GAUZE/BANDAGES/DRESSINGS) ×1
APL SKNCLS STERI-STRIP NONHPOA (GAUZE/BANDAGES/DRESSINGS)
BAG COUNTER SPONGE SURGICOUNT (BAG) ×2 IMPLANT
BAG SPNG CNTER NS LX DISP (BAG) ×1
BASKET BONE COLLECTION (BASKET) ×1 IMPLANT
BENZOIN TINCTURE PRP APPL 2/3 (GAUZE/BANDAGES/DRESSINGS) IMPLANT
BIT DRILL PLIF MAS DISP 5.5MM (DRILL) IMPLANT
BLADE CLIPPER SURG (BLADE) IMPLANT
BUR MATCHSTICK NEURO 3.0 LAGG (BURR) ×2 IMPLANT
BUR PRECISION FLUTE 5.0 (BURR) ×2 IMPLANT
CAGE CONVEX CASCADIA 8.5X22X11 (Cage) ×2 IMPLANT
CANISTER SUCT 3000ML PPV (MISCELLANEOUS) ×2 IMPLANT
CAP RELINE MOD TULIP RMM (Cap) ×6 IMPLANT
CARTRIDGE OIL MAESTRO DRILL (MISCELLANEOUS) ×1 IMPLANT
CNTNR URN SCR LID CUP LEK RST (MISCELLANEOUS) ×1 IMPLANT
CONT SPEC 4OZ STRL OR WHT (MISCELLANEOUS) ×2
COVER BACK TABLE 60X90IN (DRAPES) IMPLANT
DECANTER SPIKE VIAL GLASS SM (MISCELLANEOUS) ×2 IMPLANT
DERMABOND ADVANCED (GAUZE/BANDAGES/DRESSINGS) ×1
DERMABOND ADVANCED .7 DNX12 (GAUZE/BANDAGES/DRESSINGS) ×1 IMPLANT
DIFFUSER DRILL AIR PNEUMATIC (MISCELLANEOUS) ×2 IMPLANT
DRAPE C-ARM 42X72 X-RAY (DRAPES) ×4 IMPLANT
DRAPE C-ARMOR (DRAPES) ×1 IMPLANT
DRAPE LAPAROTOMY 100X72X124 (DRAPES) ×2 IMPLANT
DRAPE SURG 17X23 STRL (DRAPES) ×2 IMPLANT
DRILL PLIF MAS DISP 5.5MM (DRILL) ×2
DRSG OPSITE POSTOP 4X6 (GAUZE/BANDAGES/DRESSINGS) ×1 IMPLANT
DRSG OPSITE POSTOP 4X8 (GAUZE/BANDAGES/DRESSINGS) ×1 IMPLANT
DURAPREP 26ML APPLICATOR (WOUND CARE) ×2 IMPLANT
ELECT REM PT RETURN 9FT ADLT (ELECTROSURGICAL) ×2
ELECTRODE REM PT RTRN 9FT ADLT (ELECTROSURGICAL) ×1 IMPLANT
GAUZE 4X4 16PLY ~~LOC~~+RFID DBL (SPONGE) ×2 IMPLANT
GAUZE SPONGE 4X4 12PLY STRL (GAUZE/BANDAGES/DRESSINGS) IMPLANT
GLOVE EXAM NITRILE XL STR (GLOVE) IMPLANT
GLOVE SURG LTX SZ6.5 (GLOVE) ×4 IMPLANT
GLOVE SURG LTX SZ7 (GLOVE) ×3 IMPLANT
GLOVE SURG LTX SZ7.5 (GLOVE) ×1 IMPLANT
GLOVE SURG POLYISO LF SZ6.5 (GLOVE) ×2 IMPLANT
GLOVE SURG UNDER POLY LF SZ7 (GLOVE) ×2 IMPLANT
GLOVE SURG UNDER POLY LF SZ7.5 (GLOVE) ×3 IMPLANT
GOWN STRL REUS W/ TWL LRG LVL3 (GOWN DISPOSABLE) ×2 IMPLANT
GOWN STRL REUS W/ TWL XL LVL3 (GOWN DISPOSABLE) IMPLANT
GOWN STRL REUS W/TWL 2XL LVL3 (GOWN DISPOSABLE) IMPLANT
GOWN STRL REUS W/TWL LRG LVL3 (GOWN DISPOSABLE) ×6
GOWN STRL REUS W/TWL XL LVL3 (GOWN DISPOSABLE)
GRAFT DURAGEN MATRIX 3WX3L (Graft) ×2 IMPLANT
GRAFT DURAGEN MATRIX 3X3 SNGL (Graft) IMPLANT
KIT BASIN OR (CUSTOM PROCEDURE TRAY) ×2 IMPLANT
KIT POSITION SURG JACKSON T1 (MISCELLANEOUS) ×2 IMPLANT
KIT TURNOVER KIT B (KITS) ×2 IMPLANT
MILL MEDIUM DISP (BLADE) IMPLANT
NDL HYPO 25X1 1.5 SAFETY (NEEDLE) ×1 IMPLANT
NDL SPNL 18GX3.5 QUINCKE PK (NEEDLE) IMPLANT
NEEDLE HYPO 25X1 1.5 SAFETY (NEEDLE) ×2 IMPLANT
NEEDLE SPNL 18GX3.5 QUINCKE PK (NEEDLE) IMPLANT
NS IRRIG 1000ML POUR BTL (IV SOLUTION) ×2 IMPLANT
OIL CARTRIDGE MAESTRO DRILL (MISCELLANEOUS) ×2
PACK LAMINECTOMY NEURO (CUSTOM PROCEDURE TRAY) ×2 IMPLANT
PAD ARMBOARD 7.5X6 YLW CONV (MISCELLANEOUS) ×5 IMPLANT
ROD RELINE 5.0X45MM (Rod) ×1 IMPLANT
ROD RELINE COCR LORD 5.0X35 (Rod) ×1 IMPLANT
SCREW LOCK RSS 4.5/5.0MM (Screw) ×4 IMPLANT
SCREW SHANK RELINE MOD 5.5X30 (Screw) ×2 IMPLANT
SCREW SHANK RELINE MOD 5.5X35 (Screw) ×3 IMPLANT
SCREW SHANK RELINE MOD 6.5X30 (Screw) ×1 IMPLANT
SHANK RELINE O MOD 5.5X45MM (Screw) ×1 IMPLANT
SPONGE SURGIFOAM ABS GEL 100 (HEMOSTASIS) ×2 IMPLANT
SPONGE T-LAP 4X18 ~~LOC~~+RFID (SPONGE) ×4 IMPLANT
STRIP CLOSURE SKIN 1/2X4 (GAUZE/BANDAGES/DRESSINGS) IMPLANT
SUT PROLENE 6 0 BV (SUTURE) IMPLANT
SUT VIC AB 0 CT1 18XCR BRD8 (SUTURE) ×1 IMPLANT
SUT VIC AB 0 CT1 8-18 (SUTURE) ×4
SUT VIC AB 2-0 CT1 18 (SUTURE) ×2 IMPLANT
SUT VIC AB 3-0 SH 8-18 (SUTURE) ×5 IMPLANT
TOWEL GREEN STERILE (TOWEL DISPOSABLE) ×2 IMPLANT
TOWEL GREEN STERILE FF (TOWEL DISPOSABLE) ×2 IMPLANT
WATER STERILE IRR 1000ML POUR (IV SOLUTION) ×2 IMPLANT

## 2020-09-15 NOTE — Care Management Important Message (Signed)
Important Message  Patient Details  Name: Haley Roberts MRN: [id number] Date of Birth: Aug 29, 1945   Medicare Important Message Given:  Yes     [name] [name] 09/15/2020, 3:09 PM

## 2020-09-15 NOTE — Anesthesia Procedure Notes (Signed)
Procedure Name: Intubation Date/Time: 09/15/2020 6:53 PM Performed by: Jearld Pies, CRNA Pre-anesthesia Checklist: Patient identified, Emergency Drugs available, Suction available and Patient being monitored Patient Re-evaluated:Patient Re-evaluated prior to induction Oxygen Delivery Method: Circle System Utilized Preoxygenation: Pre-oxygenation with 100% oxygen Induction Type: IV induction Ventilation: Mask ventilation without difficulty Laryngoscope Size: Miller and 2 Grade View: Grade I Tube type: Oral Tube size: 7.0 mm Number of attempts: 1 Airway Equipment and Method: Stylet Placement Confirmation: ETT inserted through vocal cords under direct vision, positive ETCO2 and breath sounds checked- equal and bilateral Secured at: 21 cm Tube secured with: Tape Dental Injury: Teeth and Oropharynx as per pre-operative assessment

## 2020-09-15 NOTE — Anesthesia Preprocedure Evaluation (Addendum)
Anesthesia Evaluation  Patient identified by MRN, date of birth, ID band Patient awake    Reviewed: Allergy & Precautions, NPO status , Patient's Chart, lab work & pertinent test results  History of Anesthesia Complications Negative for: history of anesthetic complications  Airway Mallampati: I  TM Distance: >3 FB Neck ROM: Full    Dental  (+) Caps, Chipped, Dental Advisory Given   Pulmonary neg pulmonary ROS,    breath sounds clear to auscultation       Cardiovascular hypertension, Pt. on medications (-) anginanegative cardio ROS   Rhythm:Regular Rate:Normal     Neuro/Psych Anxiety Depression Back pain    GI/Hepatic negative GI ROS, Neg liver ROS,   Endo/Other  negative endocrine ROS  Renal/GU negative Renal ROS     Musculoskeletal  (+) Arthritis , Rheumatoid disorders and steroids,  Fibromyalgia -  Abdominal   Peds  Hematology  (+) Blood dyscrasia (Hb 9.9), anemia ,   Anesthesia Other Findings   Reproductive/Obstetrics                           Anesthesia Physical Anesthesia Plan  ASA: 2  Anesthesia Plan: General   Post-op Pain Management:    Induction: Intravenous  PONV Risk Score and Plan: 4 or greater and Ondansetron and Diphenhydramine  Airway Management Planned: Oral ETT  Additional Equipment:   Intra-op Plan:   Post-operative Plan: Extubation in OR  Informed Consent: I have reviewed the patients History and Physical, chart, labs and discussed the procedure including the risks, benefits and alternatives for the proposed anesthesia with the patient or authorized representative who has indicated his/her understanding and acceptance.     Dental advisory given  Plan Discussed with: Anesthesiologist, CRNA and Surgeon  Anesthesia Plan Comments:        Anesthesia Quick Evaluation

## 2020-09-15 NOTE — Progress Notes (Addendum)
PROGRESS NOTE    Haley Roberts  [id number] DOB: [birth year] DOA: 09/09/2020 PCP: Binnie Rail, MD   Chief Complaint  Patient presents with   Back Pain   Hip Pain    Brief Narrative: 75 year old female with history of chronic back pain on narcotics, RA on prednisone, fibromyalgia, HTN-who recently had a ED visit following a mechanical fall-she was found to have a nondisplaced periprosthetic fracture of the right medial femoral condyle and a right foot avulsion fracture-discharged home with plans for outpatient orthopedic follow-up-presented to the ED on 8/5 with intractable back pain.  MRI of the lumbar sacral spine showed severe spinal stenosis at L3/4 and L4/5-due to significant degenerative arthritis.  Evaluated by neurosurgery with plans for decompression in the operating room today.  Subjective: Appears comfortable-but continues to complain of back pain.  Assessment & Plan: Intractable acute on chronic low back pain due to severe spinal stenosis: Neurosurgery plans decompression at L3/4 and L4/5.  Continue supportive care in the interim.  Rheumatoid arthritis/fibromyalgia: No flare-on prednisone/leflunomide   Hypertension: BP stable-continue HCTZ, losartan-follow and adjust.    Depression: stable on bupropion and Lexapro  Fall abt 2 wks PTA with non displaced right periprosthetic fracture of the medial femoral condyle with right foot avulsion fractures of the anterior calcaneus: Appreciate orthopedic input-recommendations are to continue knee immobilizer-and limited weightbearing on the right leg   Normocytic anemia: Probably related to underlying RA-no indication for PRBC transfusion.  Overweight Body mass index is 29.06 kg/m.   DVT prophylaxis: SCDs Code Status:   Code Status: Full Code  Family Communication: plan of care discussed with patient ( son updated 8/28)   Status is: Inpatient  Remains inpatient for ongoing management of intractable low back pain with  IV opiates, procedure scheduled on 8/31.  Dispo: The patient is from: Home              Anticipated d/c is to: SNF versus home health              Patient currently is not medically stable to d/c.   Difficult to place patient No  Unresulted Labs (From admission, onward)    None       Medications reviewed:  Scheduled Meds:  vitamin C  500 mg Oral Daily   buPROPion  300 mg Oral Daily   Chlorhexidine Gluconate Cloth  6 each Topical Daily   cyclobenzaprine  10 mg Oral TID   docusate sodium  100 mg Oral Daily   escitalopram  20 mg Oral Daily   hydrochlorothiazide  25 mg Oral Daily   leflunomide  10 mg Oral Daily   lidocaine  1 patch Transdermal Q24H   losartan  100 mg Oral Daily   mupirocin ointment  1 application Nasal BID   polyethylene glycol  17 g Oral Daily   predniSONE  10 mg Oral 4X daily taper   Vitamin E  400 Units Oral Daily   Continuous Infusions:   ceFAZolin (ANCEF) IV     Consultants:see note  Procedures:see note Antimicrobials: Anti-infectives (From admission, onward)    Start     Dose/Rate Route Frequency Ordered Stop   09/15/20 0600  ceFAZolin (ANCEF) IVPB 2g/100 mL premix        2 g 200 mL/hr over 30 Minutes Intravenous On call to O.R. 09/14/20 2055 09/16/20 0559      Culture/Microbiology No results found for: SDES, SPECREQUEST, CULT, REPTSTATUS  Other culture-see note  Objective: Vitals: Today's Vitals   09/15/20  0402 09/15/20 0742 09/15/20 0800 09/15/20 0830  BP: (!) 147/94 139/88    Pulse: 79 100    Resp: 16 17    Temp: (!) 97.4 F (36.3 C) 99 F (37.2 C)    TempSrc: Oral Oral    SpO2: 96% 92%    Weight:      Height:      PainSc:   8  8    No intake or output data in the 24 hours ending 09/15/20 1137  Filed Weights   09/10/20 1502  Weight: 74.4 kg   Weight change:   Intake/Output from previous day: 08/30 0701 - 08/31 0700 In: -  Out: 300 [Urine:300] Intake/Output this shift: No intake/output data recorded. Filed Weights    09/10/20 1502  Weight: 74.4 kg   Examination: Gen Exam:Alert awake-not in any distress HEENT:atraumatic, normocephalic Chest: B/L clear to auscultation anteriorly CVS:S1S2 regular Abdomen:soft non tender, non distended Extremities:no edema Neurology: Non focal Skin: no rash   Data Reviewed: I have personally reviewed following labs and imaging studies CBC: Recent Labs  Lab 09/09/20 2248 09/11/20 0532  WBC 6.2 5.3  NEUTROABS 5.3  --   HGB 10.4* 9.9*  HCT 32.9* 31.7*  MCV 94.5 95.2  PLT 394 [id number]    Basic Metabolic Panel: Recent Labs  Lab 09/09/20 2248 09/11/20 0532 09/13/20 0409  NA 139 144 139  K 3.6 3.4* 4.2  CL 105 108 103  CO2 '25 28 29  '$ GLUCOSE 126* 88 134*  BUN 25* 19 18  CREATININE 0.58 0.53 0.62  CALCIUM 9.3 9.2 9.3    GFR: Estimated Creatinine Clearance: 60.5 mL/min (by C-G formula based on SCr of 0.62 mg/dL). Liver Function Tests: No results for input(s): AST, ALT, ALKPHOS, BILITOT, PROT, ALBUMIN in the last 168 hours. No results for input(s): LIPASE, AMYLASE in the last 168 hours. No results for input(s): AMMONIA in the last 168 hours. Coagulation Profile: No results for input(s): INR, PROTIME in the last 168 hours. Cardiac Enzymes: No results for input(s): CKTOTAL, CKMB, CKMBINDEX, TROPONINI in the last 168 hours. BNP (last 3 results) No results for input(s): PROBNP in the last 8760 hours. HbA1C: No results for input(s): HGBA1C in the last 72 hours. CBG: No results for input(s): GLUCAP in the last 168 hours. Lipid Profile: No results for input(s): CHOL, HDL, LDLCALC, TRIG, CHOLHDL, LDLDIRECT in the last 72 hours. Thyroid Function Tests: No results for input(s): TSH, T4TOTAL, FREET4, T3FREE, THYROIDAB in the last 72 hours. Anemia Panel: No results for input(s): VITAMINB12, FOLATE, FERRITIN, TIBC, IRON, RETICCTPCT in the last 72 hours. Sepsis Labs: No results for input(s): PROCALCITON, LATICACIDVEN in the last 168 hours.  Recent Results (from  the past 240 hour(s))  Resp Panel by RT-PCR (Flu A&B, Covid) Nasopharyngeal Swab     Status: None   Collection Time: 09/09/20 10:22 PM   Specimen: Nasopharyngeal Swab; Nasopharyngeal(NP) swabs in vial transport medium  Result Value Ref Range Status   SARS Coronavirus 2 by RT PCR NEGATIVE NEGATIVE Final    Comment: (NOTE) SARS-CoV-2 target nucleic acids are NOT DETECTED.  The SARS-CoV-2 RNA is generally detectable in upper respiratory specimens during the acute phase of infection. The lowest concentration of SARS-CoV-2 viral copies this assay can detect is 138 copies/mL. A negative result does not preclude SARS-Cov-2 infection and should not be used as the sole basis for treatment or other patient management decisions. A negative result may occur with  improper specimen collection/handling, submission of specimen other than nasopharyngeal swab,  presence of viral mutation(s) within the areas targeted by this assay, and inadequate number of viral copies(<138 copies/mL). A negative result must be combined with clinical observations, patient history, and epidemiological information. The expected result is Negative.  Fact Sheet for Patients:  EntrepreneurPulse.com.au  Fact Sheet for Healthcare Providers:  IncredibleEmployment.be  This test is no t yet approved or cleared by the [place] FDA and  has been authorized for detection and/or diagnosis of SARS-CoV-2 by FDA under an Emergency Use Authorization (EUA). This EUA will remain  in effect (meaning this test can be used) for the duration of the COVID-19 declaration under Section 564(b)(1) of the Act, 21 U.S.C.section 360bbb-3(b)(1), unless the authorization is terminated  or revoked sooner.       Influenza A by PCR NEGATIVE NEGATIVE Final   Influenza B by PCR NEGATIVE NEGATIVE Final    Comment: (NOTE) The Xpert Xpress SARS-CoV-2/FLU/RSV plus assay is intended as an aid in the diagnosis of  influenza from Nasopharyngeal swab specimens and should not be used as a sole basis for treatment. Nasal washings and aspirates are unacceptable for Xpert Xpress SARS-CoV-2/FLU/RSV testing.  Fact Sheet for Patients: EntrepreneurPulse.com.au  Fact Sheet for Healthcare Providers: IncredibleEmployment.be  This test is not yet approved or cleared by the [place] FDA and has been authorized for detection and/or diagnosis of SARS-CoV-2 by FDA under an Emergency Use Authorization (EUA). This EUA will remain in effect (meaning this test can be used) for the duration of the COVID-19 declaration under Section 564(b)(1) of the Act, 21 U.S.C. section 360bbb-3(b)(1), unless the authorization is terminated or revoked.  Performed at [facility], [place] [address] [place], [place] [address]   Surgical pcr screen     Status: Abnormal   Collection Time: 09/14/20  9:20 PM   Specimen: Nasal Mucosa; Nasal Swab  Result Value Ref Range Status   MRSA, PCR NEGATIVE NEGATIVE Final   Staphylococcus aureus POSITIVE (A) NEGATIVE Final    Comment: (NOTE) The Xpert SA Assay (FDA approved for NASAL specimens in patients 40 years of age and older), is one component of a comprehensive surveillance program. It is not intended to diagnose infection nor to guide or monitor treatment. Performed at [facility], [place] [address], [place], [place] [address]     Radiology Studies: No results found.   LOS: 5 days   Oren Binet, MD [facility]  09/15/2020, 11:37 AM

## 2020-09-15 NOTE — Progress Notes (Signed)
Patient ID: Haley Roberts, female   DOB: [birth year], 75 y.o.   MRN: [id number] BP 133/78   Pulse (!) 103   Temp 98.4 F (36.9 C) (Oral)   Resp 16   Ht '5\' 3"'$  (1.6 m)   Wt 74.4 kg   SpO2 95%   BMI 29.06 kg/m  Alert and oriented x 4 Speech is clear and fluent Moving all extremities Will proceed to OR for operative decompression. Severe stenosis at L3/4, and L4/5.

## 2020-09-16 DIAGNOSIS — M4316 Spondylolisthesis, lumbar region: Secondary | ICD-10-CM | POA: Diagnosis present

## 2020-09-16 LAB — POCT I-STAT EG7
Acid-base deficit: 2 mmol/L (ref 0.0–2.0)
Bicarbonate: 24.5 mmol/L (ref 20.0–28.0)
Calcium, Ion: 1.09 mmol/L — ABNORMAL LOW (ref 1.15–1.40)
HCT: 33 % — ABNORMAL LOW (ref 36.0–46.0)
Hemoglobin: 11.2 g/dL — ABNORMAL LOW (ref 12.0–15.0)
O2 Saturation: 72 %
Potassium: 3.7 mmol/L (ref 3.5–5.1)
Sodium: 141 mmol/L (ref 135–145)
TCO2: 26 mmol/L (ref 22–32)
pCO2, Ven: 47.8 mmHg (ref 44.0–60.0)
pH, Ven: 7.318 (ref 7.250–7.430)
pO2, Ven: 41 mmHg (ref 32.0–45.0)

## 2020-09-16 LAB — CBC
HCT: 34.4 % — ABNORMAL LOW (ref 36.0–46.0)
Hemoglobin: 11 g/dL — ABNORMAL LOW (ref 12.0–15.0)
MCH: 29.2 pg (ref 26.0–34.0)
MCHC: 32 g/dL (ref 30.0–36.0)
MCV: 91.2 fL (ref 80.0–100.0)
Platelets: 363 10*3/uL (ref 150–400)
RBC: 3.77 MIL/uL — ABNORMAL LOW (ref 3.87–5.11)
RDW: 14.7 % (ref 11.5–15.5)
WBC: 12.2 10*3/uL — ABNORMAL HIGH (ref 4.0–10.5)
nRBC: 0 % (ref 0.0–0.2)

## 2020-09-16 LAB — BPAM RBC
Blood Product Expiration Date: 202209302359
ISSUE DATE / TIME: 202208312050
Unit Type and Rh: 5100

## 2020-09-16 LAB — TYPE AND SCREEN
ABO/RH(D): O POS
Antibody Screen: NEGATIVE
Unit division: 0

## 2020-09-16 LAB — CREATININE, SERUM
Creatinine, Ser: 0.64 mg/dL (ref 0.44–1.00)
GFR, Estimated: >60 mL/min (ref >60)

## 2020-09-16 MED ORDER — BUPIVACAINE HCL (PF) 0.5 % IJ SOLN
INTRAMUSCULAR | Status: DC | PRN
  Administered 2020-09-16: 30 mL

## 2020-09-16 MED ORDER — SUGAMMADEX SODIUM 200 MG/2ML IV SOLN
INTRAVENOUS | Status: DC | PRN
  Administered 2020-09-16: 200 mg via INTRAVENOUS

## 2020-09-16 MED ORDER — BISACODYL 10 MG RE SUPP: 10.0000 mg | Freq: Every day | RECTAL | Status: DC | PRN

## 2020-09-16 MED ORDER — CEFAZOLIN SODIUM-DEXTROSE 1-4 GM/50ML-% IV SOLN
1.0000 g | Freq: Three times a day (TID) | INTRAVENOUS | Status: AC
  Administered 2020-09-16 (×2): 1 g via INTRAVENOUS
  Filled 2020-09-16 (×2): qty 50

## 2020-09-16 MED ORDER — OXYCODONE HCL 5 MG PO TABS
5.0000 mg | ORAL_TABLET | ORAL | Status: DC | PRN
  Administered 2020-09-16: 5 mg via ORAL
  Filled 2020-09-16: qty 1

## 2020-09-16 MED ORDER — POTASSIUM CHLORIDE IN NACL 20-0.9 MEQ/L-% IV SOLN
INTRAVENOUS | Status: DC
  Filled 2020-09-16: qty 1000

## 2020-09-16 MED ORDER — PREDNISONE 5 MG PO TABS: 10.0000 mg | ORAL_TABLET | Freq: Two times a day (BID) | ORAL | Status: DC

## 2020-09-16 MED ORDER — ACETAMINOPHEN 500 MG PO TABS
1000.0000 mg | ORAL_TABLET | Freq: Four times a day (QID) | ORAL | Status: AC
  Administered 2020-09-16 (×4): 1000 mg via ORAL
  Filled 2020-09-16 (×4): qty 2

## 2020-09-16 MED ORDER — MORPHINE SULFATE (PF) 2 MG/ML IV SOLN: 2.0000 mg | INTRAVENOUS | Status: DC | PRN

## 2020-09-16 MED ORDER — FLEET ENEMA 7-19 GM/118ML RE ENEM: 1.0000 | ENEMA | Freq: Once | RECTAL | Status: DC | PRN

## 2020-09-16 MED ORDER — PHENOL 1.4 % MT LIQD: 1.0000 | OROMUCOSAL | Status: DC | PRN

## 2020-09-16 MED ORDER — ACETAMINOPHEN 650 MG RE SUPP: 650.0000 mg | RECTAL | Status: DC | PRN

## 2020-09-16 MED ORDER — DIAZEPAM 5 MG PO TABS: 5.0000 mg | ORAL_TABLET | Freq: Four times a day (QID) | ORAL | Status: DC | PRN

## 2020-09-16 MED ORDER — SODIUM CHLORIDE 0.9 % IV SOLN: 250.0000 mL | INTRAVENOUS | Status: DC

## 2020-09-16 MED ORDER — PREDNISONE 5 MG PO TABS
10.0000 mg | ORAL_TABLET | Freq: Three times a day (TID) | ORAL | Status: DC
  Administered 2020-09-17 (×2): 10 mg via ORAL
  Filled 2020-09-16 (×2): qty 2

## 2020-09-16 MED ORDER — PREDNISONE 5 MG PO TABS: 10.0000 mg | ORAL_TABLET | Freq: Every day | ORAL | Status: DC

## 2020-09-16 MED ORDER — POLYETHYLENE GLYCOL 3350 17 G PO PACK
17.0000 g | PACK | Freq: Two times a day (BID) | ORAL | Status: DC
  Administered 2020-09-16 – 2020-09-17 (×3): 17 g via ORAL
  Filled 2020-09-16 (×3): qty 1

## 2020-09-16 MED ORDER — SODIUM CHLORIDE 0.9% FLUSH
3.0000 mL | Freq: Two times a day (BID) | INTRAVENOUS | Status: DC
  Administered 2020-09-16 – 2020-09-17 (×3): 3 mL via INTRAVENOUS

## 2020-09-16 MED ORDER — ONDANSETRON HCL 4 MG/2ML IJ SOLN
INTRAMUSCULAR | Status: DC | PRN
  Administered 2020-09-16: 4 mg via INTRAVENOUS

## 2020-09-16 MED ORDER — OXYCODONE HCL ER 10 MG PO T12A
10.0000 mg | EXTENDED_RELEASE_TABLET | Freq: Two times a day (BID) | ORAL | Status: DC
  Administered 2020-09-16: 10 mg via ORAL
  Filled 2020-09-16: qty 1

## 2020-09-16 MED ORDER — SENNOSIDES-DOCUSATE SODIUM 8.6-50 MG PO TABS: 1.0000 | ORAL_TABLET | Freq: Every evening | ORAL | Status: DC | PRN

## 2020-09-16 MED ORDER — PREDNISONE 5 MG PO TABS: 5.0000 mg | ORAL_TABLET | Freq: Every day | ORAL | Status: DC

## 2020-09-16 MED ORDER — SODIUM CHLORIDE 0.9% FLUSH: 3.0000 mL | INTRAVENOUS | Status: DC | PRN

## 2020-09-16 MED ORDER — PREDNISONE 5 MG PO TABS
10.0000 mg | ORAL_TABLET | Freq: Four times a day (QID) | ORAL | Status: AC
  Administered 2020-09-16 (×4): 10 mg via ORAL
  Filled 2020-09-16 (×4): qty 2

## 2020-09-16 MED ORDER — ONDANSETRON HCL 4 MG/2ML IJ SOLN: 4.0000 mg | Freq: Four times a day (QID) | INTRAMUSCULAR | Status: DC | PRN

## 2020-09-16 MED ORDER — BISACODYL 5 MG PO TBEC: 5.0000 mg | DELAYED_RELEASE_TABLET | Freq: Every day | ORAL | Status: DC | PRN

## 2020-09-16 MED ORDER — ACETAMINOPHEN 325 MG PO TABS
650.0000 mg | ORAL_TABLET | ORAL | Status: DC | PRN
  Administered 2020-09-17: 650 mg via ORAL
  Filled 2020-09-16: qty 2

## 2020-09-16 MED ORDER — HEPARIN SODIUM (PORCINE) 5000 UNIT/ML IJ SOLN
5000.0000 [IU] | Freq: Three times a day (TID) | INTRAMUSCULAR | Status: DC
  Administered 2020-09-16 – 2020-09-17 (×4): 5000 [IU] via SUBCUTANEOUS
  Filled 2020-09-16 (×4): qty 1

## 2020-09-16 MED ORDER — ONDANSETRON HCL 4 MG PO TABS: 4.0000 mg | ORAL_TABLET | Freq: Four times a day (QID) | ORAL | Status: DC | PRN

## 2020-09-16 MED ORDER — OXYCODONE HCL 5 MG PO TABS
10.0000 mg | ORAL_TABLET | ORAL | Status: DC | PRN
  Administered 2020-09-16 – 2020-09-17 (×2): 10 mg via ORAL
  Filled 2020-09-16 (×2): qty 2

## 2020-09-16 MED ORDER — MENTHOL 3 MG MT LOZG: 1.0000 | LOZENGE | OROMUCOSAL | Status: DC | PRN

## 2020-09-16 NOTE — Anesthesia Postprocedure Evaluation (Signed)
Anesthesia Post Note  Patient: Haley Roberts  Procedure(s) Performed: Lumbar Three-Four, Lumbar Four-Five Posterior lumbar interbody fusion (Back)     Patient location during evaluation: PACU Anesthesia Type: General Level of consciousness: awake and alert, patient cooperative and oriented Pain management: pain level controlled Vital Signs Assessment: post-procedure vital signs reviewed and stable Respiratory status: spontaneous breathing, nonlabored ventilation, respiratory function stable and patient connected to nasal cannula oxygen Cardiovascular status: blood pressure returned to baseline and stable Postop Assessment: no apparent nausea or vomiting Anesthetic complications: no   No notable events documented.  Last Vitals:  Vitals:   09/16/20 0223 09/16/20 0334  BP: 126/63 122/73  Pulse: 99 80  Resp: 15 19  Temp: 36.6 C 36.6 C  SpO2: 95% 98%                  Jatavion Peaster,E. Karlene Southard

## 2020-09-16 NOTE — Plan of Care (Signed)
  Problem: Clinical Measurements: Goal: Will remain free from infection Outcome: Progressing Goal: Diagnostic test results will improve Outcome: Progressing Goal: Respiratory complications will improve Outcome: Progressing Goal: Cardiovascular complication will be avoided Outcome: Progressing   Problem: Coping: Goal: Level of anxiety will decrease Outcome: Progressing   

## 2020-09-16 NOTE — Evaluation (Signed)
Occupational Therapy Evaluation Patient Details Name: Haley Roberts MRN: [id number] DOB: March 29, 1945 Today's Date: 09/16/2020    History of Present Illness KEYATTA Roberts is a 75 y.o. female who presents with concerns of worsening lower back pain. Patient reports that for the past several months she has had gradual worsening of her lower back pain. This became more unbearable in the past week. She also recently was evaluated in the ED on 8/21 for a fall and was found to have nondisplaced Henrene Pastor prosthetic fracture of the right medial femoral condyle and right foot avulsion fracture of the anterior calcaneus- Knee immobilizer nonweightbearing follow-up with [facility]. MRI of the lumbar spine showing facet arthropathy and degenerative anterior listhesis of multiple levels.  Pt underwent L3-L5 PLIF on 8/31.   PMH: chronic back pain on chronic opioid, RA, fibromyalgia, and HTN   Clinical Impression   Patient admitted for the diagnosis and procedure above.  PTA she was independent and her husbands caregiver.  She also has a PCA that assists with her spouse as well.  The patient did sustain a fall resulting in R leg fractures and NWB to her R leg with a KI.  Recently she states being able to walk with the RW, and performing ADL seated due to fractures and WBS.  Deficits to independence are listed below.  Currently she is needing up to +2 for basic transfers and ADL completion at supportive sitting position. CIR has been recommended for post acute rehab.  Acute OT too continue efforts to maximize her functional status.         Follow Up Recommendations  CIR    Equipment Recommendations  Other (comment) (hip kit)    Recommendations for Other Services Rehab consult     Precautions / Restrictions Precautions Precautions: Fall Precaution Comments: RLE fractures identified Required Braces or Orthoses: Knee Immobilizer - Right Restrictions Weight Bearing Restrictions: Yes RLE Weight Bearing: Non  weight bearing      Mobility Bed Mobility Overal bed mobility: Needs Assistance Bed Mobility: Sit to Supine       Sit to supine: Max assist     Patient Response: Cooperative  Transfers Overall transfer level: Needs assistance   Transfers: Lateral/Scoot Transfers          Lateral/Scoot Transfers: Max assist;+2 physical assistance;From elevated surface      Balance Overall balance assessment: Needs assistance Sitting-balance support: Feet supported;Bilateral upper extremity supported Sitting balance-Leahy Scale: Poor   Postural control: Posterior lean                                 ADL either performed or assessed with clinical judgement   ADL Overall ADL's : Needs assistance/impaired Eating/Feeding: Set up;Sitting   Grooming: Wash/dry hands;Wash/dry face;Set up;Sitting Grooming Details (indicate cue type and reason): supported sitting.         Upper Body Dressing : Minimal assistance;Sitting   Lower Body Dressing: Maximal assistance;Bed level       Toileting- Clothing Manipulation and Hygiene: Maximal assistance;Bed level       Functional mobility during ADLs: Maximal assistance;+2 for safety/equipment;+2 for physical assistance       Vision Baseline Vision/History: 1 Wears glasses Ability to See in Adequate Light: 0 Adequate Patient Visual Report: No change from baseline       Perception     Praxis      Pertinent Vitals/Pain Pain Assessment: 0-10 Pain Score: 7  Pain Location:  low back to bil hips, RLE Pain Descriptors / Indicators: Pins and needles;Numbness Pain Intervention(s): Monitored during session     Hand Dominance Right   Extremity/Trunk Assessment Upper Extremity Assessment Upper Extremity Assessment: Generalized weakness   Lower Extremity Assessment Lower Extremity Assessment: Defer to PT evaluation   Cervical / Trunk Assessment Cervical / Trunk Assessment: Other exceptions Cervical / Trunk Exceptions:  spine surgery   Communication Communication Communication: No difficulties   Cognition Arousal/Alertness: Awake/alert Behavior During Therapy: Anxious Overall Cognitive Status: Within Functional Limits for tasks assessed                                     General Comments   O2 reduced to 2.5L with good saturations and RN notified.    Exercises     Shoulder Instructions      Home Living Family/patient expects to be discharged to:: Private residence Living Arrangements: Spouse/significant other Available Help at Discharge: Family;Available PRN/intermittently Type of Home: House Home Access: Ramped entrance     Home Layout: Two level Alternate Level Stairs-Number of Steps: has stair lift from husband with ALS   Bathroom Shower/Tub: Walk-in shower   Bathroom Toilet: Handicapped height     Home Equipment: Environmental consultant - 2 wheels;Bedside commode;Shower seat;Grab bars - toilet;Grab bars - tub/shower;Wheelchair - manual   Additional Comments: pt's spouse has ALS with caregivers, pt able to use his w/c, has stair lift, home is w/c accesible      Prior Functioning/Environment Level of Independence: Independent                 OT Problem List: Decreased strength;Decreased range of motion;Decreased activity tolerance;Impaired balance (sitting and/or standing);Decreased knowledge of use of DME or AE;Decreased safety awareness;Pain      OT Treatment/Interventions: Self-care/ADL training;Therapeutic exercise;DME and/or AE instruction;Balance training;Patient/family education;Therapeutic activities    OT Goals(Current goals can be found in the care plan section) Acute Rehab OT Goals Patient Stated Goal: move better, I really want to stand OT Goal Formulation: With patient Time For Goal Achievement: 09/30/20 Potential to Achieve Goals: Good ADL Goals Pt Will Perform Grooming: with set-up;sitting Pt Will Perform Lower Body Dressing: with min assist;sitting/lateral  leans;with adaptive equipment Pt Will Transfer to Toilet: with mod assist;stand pivot transfer;bedside commode Pt Will Perform Toileting - Clothing Manipulation and hygiene: sitting/lateral leans;with supervision  OT Frequency: Min 2X/week   Barriers to D/C:    none noted       Co-evaluation              AM-PAC OT "6 Clicks" Daily Activity     Outcome Measure Help from another person eating meals?: None Help from another person taking care of personal grooming?: A Little Help from another person toileting, which includes using toliet, bedpan, or urinal?: A Lot Help from another person bathing (including washing, rinsing, drying)?: A Lot Help from another person to put on and taking off regular upper body clothing?: A Little Help from another person to put on and taking off regular lower body clothing?: A Lot 6 Click Score: 16   End of Session Equipment Utilized During Treatment: Gait belt;Right knee immobilizer Nurse Communication: Mobility status  Activity Tolerance: Patient tolerated treatment well Patient left: in bed;with call bell/phone within reach;with nursing/sitter in room  OT Visit Diagnosis: Unsteadiness on feet (R26.81);Muscle weakness (generalized) (M62.81);Pain Pain - Right/Left: Right Pain - part of body: Leg  Time: 3354-5625 OT Time Calculation (min): 27 min Charges:  OT General Charges $OT Visit: 1 Visit OT Evaluation $OT Eval Moderate Complexity: 1 Mod OT Treatments $Therapeutic Activity: 8-22 mins  09/16/2020  RP, OTR/L  Acute Rehabilitation Services  Office:  [place] 09/16/2020, 2:47 PM

## 2020-09-16 NOTE — Progress Notes (Addendum)
Inpatient Rehab Admissions Coordinator Note:   Per therapy patient was screened for CIR candidacy by Michel Santee, PT. At this time, pt appears to be a potential candidate for CIR. I will place an order for rehab consult for full assessment, per our protocol.  Please contact me any with questions.Shann Medal, PT, DPT [phone] 09/16/20 4:13 PM

## 2020-09-16 NOTE — Progress Notes (Addendum)
Physical Therapy Evaluation Patient Details Name: Haley Roberts MRN: [id number] DOB: 03-18-1945 Today's Date: 09/16/2020   History of Present Illness  Haley Roberts is a 76 y.o. female who presents with concerns of worsening lower back pain. Patient reports that for the past several months she has had gradual worsening of her lower back pain. This became more unbearable in the past week. She also recently was evaluated in the ED on 8/21 for a fall and was found to have nondisplaced Henrene Pastor prosthetic fracture of the right medial femoral condyle and right foot avulsion fracture of the anterior calcaneus- Knee immobilizer nonweightbearing follow-up with [facility]. MRI of the lumbar spine showing facet arthropathy and degenerative anterior listhesis of multiple levels.  Pt underwent L3-L5 PLIF on 8/31.   PMH: chronic back pain on chronic opioid, RA, fibromyalgia, and HTN  Clinical Impression  Pt admitted with above diagnosis.  Pt limited by pain but was able to scoot to drop arm recliner with max assist of 2 with use of pad.  Pt limited in use of UEs.  Needed cues to maintain NWB right LE.  Recommend CIR so that pt can become independent with transfers to go home with sons assist.   Pt currently with functional limitations due to balance and endurance deficits. Pt will benefit from skilled PT to increase their independence and safety with mobility to allow discharge to the venue listed below.       Follow Up Recommendations CIR    Equipment Recommendations  Other (comment) (TBA)    Recommendations for Other Services Rehab consult     Precautions / Restrictions Precautions Precautions: Fall, BAck Required Braces or Orthoses: Knee Immobilizer - Right Knee Immobilizer - Right:  (No back brace needed per orders) Restrictions Weight Bearing Restrictions: Yes RLE Weight Bearing: Non weight bearing      Mobility  Bed Mobility Overal bed mobility: Needs Assistance Bed Mobility:  Rolling;Sidelying to Sit Rolling: Mod assist;+2 for physical assistance Sidelying to sit: Max assist;+2 for physical assistance       General bed mobility comments: Pt needed mod assist to log roll and max assist to come to sitting requiring 2 persons.    Transfers Overall transfer level: Needs assistance   Transfers: Lateral/Scoot Transfers          Lateral/Scoot Transfers: Max assist;+2 physical assistance;From elevated surface General transfer comment: Used pad and pt needed max assist to scoot to drop arm recliner to her left.  Pt with difficulty keeping left foot flat on floor needing assist to stabilize the foot and block the knee. Pt held onto PTs UE to scoot to the chair.  Pt with difficulty assisting with her UEs due to arthritis in hands and Bil UE weakness as well.  Ambulation/Gait             General Gait Details: Unable  Stairs            Wheelchair Mobility    Modified Rankin (Stroke Patients Only)       Balance Overall balance assessment: Needs assistance Sitting-balance support: Feet supported;Bilateral upper extremity supported Sitting balance-Leahy Scale: Poor Sitting balance - Comments: relies on UE support and external support for balance- mod to max assist as she fatigued                                     Pertinent Vitals/Pain Pain Assessment: 0-10 Pain Score: 8  Pain Location: low back to bil hips, RLE Pain Descriptors / Indicators: Grimacing;Guarding;Aching;Sharp;Sore Pain Intervention(s): Limited activity within patient's tolerance;Monitored during session;Repositioned;Premedicated before session;Patient requesting pain meds-RN notified    Home Living Family/patient expects to be discharged to:: Private residence Living Arrangements: Spouse/significant other Available Help at Discharge: Family;Available PRN/intermittently (son lives with pt and husband currently) Type of Home: House Home Access: Other (comment)  (stair lift - husband with ALS)     Home Layout: Two level Home Equipment: Walker - 2 wheels;Bedside commode;Shower seat;Grab bars - toilet;Grab bars - tub/shower;Wheelchair - manual      Prior Function Level of Independence: Independent               Hand Dominance   Dominant Hand: Right    Extremity/Trunk Assessment   Upper Extremity Assessment Upper Extremity Assessment: Defer to OT evaluation    Lower Extremity Assessment Lower Extremity Assessment: Generalized weakness RLE Deficits / Details: In Knee immobilizer, can pump ankles RLE: Unable to fully assess due to immobilization LLE Deficits / Details: AROM WNL, able to perform heelslide, SLR with increased pain shooting down RLE, knee flexion ~25%, hip abd ~50%, reports foot "numb and heavy"       Communication   Communication: No difficulties  Cognition Arousal/Alertness: Awake/alert Behavior During Therapy: WFL for tasks assessed/performed Overall Cognitive Status: Within Functional Limits for tasks assessed                                        General Comments General comments (skin integrity, edema, etc.): 98% RA.  IV in wrist on right and was dislodged with pt pushing on the hand.  Notified nursing.    Exercises     Assessment/Plan    PT Assessment Patient needs continued PT services  PT Problem List Decreased strength;Decreased range of motion;Decreased activity tolerance;Decreased balance;Decreased mobility;Impaired sensation;Pain       PT Treatment Interventions DME instruction;Gait training;Functional mobility training;Therapeutic activities;Therapeutic exercise;Balance training;Patient/family education    PT Goals (Current goals can be found in the Care Plan section)  Acute Rehab PT Goals Patient Stated Goal: return home to spouse, sons able to assist some PT Goal Formulation: With patient Time For Goal Achievement: 09/30/20 Potential to Achieve Goals: Fair    Frequency  Min 5X/week   Barriers to discharge Decreased caregiver support (Husband has ALS)      Co-evaluation               AM-PAC PT "6 Clicks" Mobility  Outcome Measure Help needed turning from your back to your side while in a flat bed without using bedrails?: Total Help needed moving from lying on your back to sitting on the side of a flat bed without using bedrails?: Total Help needed moving to and from a bed to a chair (including a wheelchair)?: Total Help needed standing up from a chair using your arms (e.g., wheelchair or bedside chair)?: Total Help needed to walk in hospital room?: Total Help needed climbing 3-5 steps with a railing? : Total 6 Click Score: 6    End of Session Equipment Utilized During Treatment: Gait belt Activity Tolerance: Patient limited by pain Patient left: in chair;with call bell/phone within reach;with chair alarm set Nurse Communication: Mobility status;Other (comment) (IV dislodged) PT Visit Diagnosis: Other abnormalities of gait and mobility (R26.89);Difficulty in walking, not elsewhere classified (R26.2)    Time: [id number] PT Time Calculation (min) (ACUTE  ONLY): 36 min   Charges:   PT Evaluation $PT Eval Moderate Complexity: 1 Mod PT Treatments $Therapeutic Activity: 8-22 mins        Azalynn Maxim M,PT Acute Rehab Services [phone] (pager)   Alvira Philips 09/16/2020, 12:25 PM

## 2020-09-16 NOTE — Transfer of Care (Signed)
Immediate Anesthesia Transfer of Care Note  Patient: Haley Roberts  Procedure(s) Performed: Lumbar Three-Four, Lumbar Four-Five Posterior lumbar interbody fusion (Back)  Patient Location: PACU  Anesthesia Type:General  Level of Consciousness: drowsy, patient cooperative and responds to stimulation  Airway & Oxygen Therapy: Patient Spontanous Breathing and Patient connected to nasal cannula oxygen  Post-op Assessment: Report given to RN and Post -op Vital signs reviewed and stable  Post vital signs: Reviewed and stable  Last Vitals:  Vitals Value Taken Time  BP 157/92 09/16/20 0109  Temp 36.4 C 09/16/20 0109  Pulse 94 09/16/20 0114  Resp 15 09/16/20 0114  SpO2 98 % 09/16/20 0114  Vitals shown include unvalidated device data.  Last Pain:  Vitals:   09/15/20 1818  TempSrc: Oral  PainSc: 7       Patients Stated Pain Goal: 3 ([id number])  Complications: No notable events documented.

## 2020-09-16 NOTE — Progress Notes (Signed)
BP (!) 142/72   Pulse 88   Temp 98.4 F (36.9 C) (Oral)   Resp 19   Ht '5\' 3"'$  (1.6 m)   Wt 74.4 kg   SpO2 99%   BMI 29.06 kg/m  Alert and oriented x 4, speech is clear and fluent Moving left lower extremity well Wound is without signs of infection Doing well post op. Will clarify status of right leg tomorrow.

## 2020-09-16 NOTE — Progress Notes (Signed)
PROGRESS NOTE    Haley Roberts  [id number] DOB: 05/29/1945 DOA: 09/09/2020 PCP: Binnie Rail, MD   Chief Complaint  Patient presents with   Back Pain   Hip Pain    Brief Narrative: 75 year old female with history of chronic back pain on narcotics, RA on prednisone, fibromyalgia, HTN-who recently had a ED visit following a mechanical fall-she was found to have a nondisplaced periprosthetic fracture of the right medial femoral condyle and a right foot avulsion fracture-discharged home with plans for outpatient orthopedic follow-up-presented to the ED on 8/5 with intractable back pain.  MRI of the lumbar sacral spine showed severe spinal stenosis at L3/4 and L4/5-due to significant degenerative arthritis.  Evaluated by neurosurgery and underwent decompressive spine surgery on 8/31.  Subjective: Feels better postsurgery-claims she can now set up.  Assessment & Plan: Intractable acute on chronic low back pain due to severe spinal stenosis: S/p decompression by neurosurgery on 8/31-pain relatively well controlled today-mobilize with PT/OT and see how she does.  Slowly minimize narcotics as much as possible.  Rheumatoid arthritis/fibromyalgia: No flare-on prednisone/leflunomide   Hypertension: BP stable-continue HCTZ, losartan-follow and adjust.    Depression: stable on bupropion and Lexapro  Fall abt 2 wks PTA with non displaced right periprosthetic fracture of the medial femoral condyle with right foot avulsion fractures of the anterior calcaneus: Appreciate orthopedic input-recommendations are to continue knee immobilizer-and limited weightbearing on the right leg   Normocytic anemia: Probably related to underlying RA-no indication for PRBC transfusion.  Overweight Body mass index is 29.06 kg/m.   DVT prophylaxis: SCDs Code Status:   Code Status: Full Code  Family Communication: None at bedside   Status is: Inpatient  Remains inpatient for ongoing management of  intractable low back pain with IV opiates, procedure scheduled on 8/31.  Dispo: The patient is from: Home              Anticipated d/c is to: SNF versus home health              Patient currently is not medically stable to d/c.   Difficult to place patient No  Unresulted Labs (From admission, onward)    None       Medications reviewed:  Scheduled Meds:  acetaminophen  1,000 mg Oral Q6H   vitamin C  500 mg Oral Daily   buPROPion  300 mg Oral Daily   Chlorhexidine Gluconate Cloth  6 each Topical Daily   cyclobenzaprine  10 mg Oral TID   docusate sodium  100 mg Oral Daily   escitalopram  20 mg Oral Daily   heparin injection (subcutaneous)  5,000 Units Subcutaneous Q8H   hydrochlorothiazide  25 mg Oral Daily   leflunomide  10 mg Oral Daily   losartan  100 mg Oral Daily   mupirocin ointment  1 application Nasal BID   oxyCODONE  10 mg Oral Q12H   polyethylene glycol  17 g Oral BID   predniSONE  10 mg Oral QID   Followed by   Derrill Memo ON 09/17/2020] predniSONE  10 mg Oral TID   Followed by   Derrill Memo ON 09/18/2020] predniSONE  10 mg Oral BID WC   Followed by   Derrill Memo ON 09/19/2020] predniSONE  10 mg Oral Q breakfast   [START ON 09/20/2020] predniSONE  5 mg Oral Q breakfast   sodium chloride flush  3 mL Intravenous Q12H   Vitamin E  400 Units Oral Daily   Continuous Infusions:  sodium chloride  sodium chloride     0.9 % NaCl with KCl 20 mEq / L 80 mL/hr at 09/16/20 0643    ceFAZolin (ANCEF) IV Stopped (09/16/20 [id number])   Consultants: Neurosurgery  Procedures: 8/31>> Lumbar Three-Four, Lumbar Four-Five Posterior lumbar interbody fusion   Antimicrobials: Anti-infectives (From admission, onward)    Start     Dose/Rate Route Frequency Ordered Stop   09/16/20 0600  ceFAZolin (ANCEF) IVPB 1 g/50 mL premix        1 g 100 mL/hr over 30 Minutes Intravenous Every 8 hours 09/16/20 0253 09/16/20 2159   09/15/20 0600  ceFAZolin (ANCEF) IVPB 2g/100 mL premix  Status:  Discontinued        2  g 200 mL/hr over 30 Minutes Intravenous On call to O.R. 09/14/20 2055 09/16/20 0213      Culture/Microbiology No results found for: SDES, SPECREQUEST, CULT, REPTSTATUS  Other culture-see note  Objective: Vitals: Today's Vitals   09/16/20 0812 09/16/20 0830 09/16/20 0856 09/16/20 1027  BP: (!) 122/94 132/83  130/76  Pulse: 76 80  76  Resp:  18    Temp: 98.2 F (36.8 C) 98 F (36.7 C)    TempSrc: Oral Oral    SpO2: 100% 100%    Weight:      Height:      PainSc:   6      Intake/Output Summary (Last 24 hours) at 09/16/2020 1236 Last data filed at 09/16/2020 1100 Gross per 24 hour  Intake 4125.59 ml  Output 1670 ml  Net 2455.59 ml    Filed Weights   09/10/20 1502  Weight: 74.4 kg   Weight change:   Intake/Output from previous day: 08/31 0701 - 09/01 0700 In: 3885.6 [I.V.:2970.6; Blood:315; IV Piggyback:600] Out: [id number] [Urine:770; Blood:300] Intake/Output this shift: Total I/O In: 240 [P.O.:240] Out: 600 [Urine:600] Filed Weights   09/10/20 1502  Weight: 74.4 kg   Examination: Gen Exam:Alert awake-not in any distress HEENT:atraumatic, normocephalic Chest: B/L clear to auscultation anteriorly CVS:S1S2 regular Abdomen:soft non tender, non distended Extremities:no edema Neurology: Non focal Skin: no rash   Data Reviewed: I have personally reviewed following labs and imaging studies CBC: Recent Labs  Lab 09/09/20 2248 09/11/20 0532 09/15/20 2128 09/16/20 0438  WBC 6.2 5.3  --  12.2*  NEUTROABS 5.3  --   --   --   HGB 10.4* 9.9* 11.2* 11.0*  HCT 32.9* 31.7* 33.0* 34.4*  MCV 94.5 95.2  --  91.2  PLT 394 298  --  [id number]    Basic Metabolic Panel: Recent Labs  Lab 09/09/20 2248 09/11/20 0532 09/13/20 0409 09/15/20 2128 09/16/20 0438  NA 139 144 139 141  --   K 3.6 3.4* 4.2 3.7  --   CL 105 108 103  --   --   CO2 '25 28 29  '$ --   --   GLUCOSE 126* 88 134*  --   --   BUN 25* 19 18  --   --   CREATININE 0.58 0.53 0.62  --  0.64  CALCIUM 9.3 9.2 9.3  --    --     GFR: Estimated Creatinine Clearance: 60.5 mL/min (by C-G formula based on SCr of 0.64 mg/dL). Liver Function Tests: No results for input(s): AST, ALT, ALKPHOS, BILITOT, PROT, ALBUMIN in the last 168 hours. No results for input(s): LIPASE, AMYLASE in the last 168 hours. No results for input(s): AMMONIA in the last 168 hours. Coagulation Profile: No results for input(s): INR, PROTIME in the  last 168 hours. Cardiac Enzymes: No results for input(s): CKTOTAL, CKMB, CKMBINDEX, TROPONINI in the last 168 hours. BNP (last 3 results) No results for input(s): PROBNP in the last 8760 hours. HbA1C: No results for input(s): HGBA1C in the last 72 hours. CBG: No results for input(s): GLUCAP in the last 168 hours. Lipid Profile: No results for input(s): CHOL, HDL, LDLCALC, TRIG, CHOLHDL, LDLDIRECT in the last 72 hours. Thyroid Function Tests: No results for input(s): TSH, T4TOTAL, FREET4, T3FREE, THYROIDAB in the last 72 hours. Anemia Panel: No results for input(s): VITAMINB12, FOLATE, FERRITIN, TIBC, IRON, RETICCTPCT in the last 72 hours. Sepsis Labs: No results for input(s): PROCALCITON, LATICACIDVEN in the last 168 hours.  Recent Results (from the past 240 hour(s))  Resp Panel by RT-PCR (Flu A&B, Covid) Nasopharyngeal Swab     Status: None   Collection Time: 09/09/20 10:22 PM   Specimen: Nasopharyngeal Swab; Nasopharyngeal(NP) swabs in vial transport medium  Result Value Ref Range Status   SARS Coronavirus 2 by RT PCR NEGATIVE NEGATIVE Final    Comment: (NOTE) SARS-CoV-2 target nucleic acids are NOT DETECTED.  The SARS-CoV-2 RNA is generally detectable in upper respiratory specimens during the acute phase of infection. The lowest concentration of SARS-CoV-2 viral copies this assay can detect is 138 copies/mL. A negative result does not preclude SARS-Cov-2 infection and should not be used as the sole basis for treatment or other patient management decisions. A negative result may  occur with  improper specimen collection/handling, submission of specimen other than nasopharyngeal swab, presence of viral mutation(s) within the areas targeted by this assay, and inadequate number of viral copies(<138 copies/mL). A negative result must be combined with clinical observations, patient history, and epidemiological information. The expected result is Negative.  Fact Sheet for Patients:  EntrepreneurPulse.com.au  Fact Sheet for Healthcare Providers:  IncredibleEmployment.be  This test is no t yet approved or cleared by the [place] FDA and  has been authorized for detection and/or diagnosis of SARS-CoV-2 by FDA under an Emergency Use Authorization (EUA). This EUA will remain  in effect (meaning this test can be used) for the duration of the COVID-19 declaration under Section 564(b)(1) of the Act, 21 U.S.C.section 360bbb-3(b)(1), unless the authorization is terminated  or revoked sooner.       Influenza A by PCR NEGATIVE NEGATIVE Final   Influenza B by PCR NEGATIVE NEGATIVE Final    Comment: (NOTE) The Xpert Xpress SARS-CoV-2/FLU/RSV plus assay is intended as an aid in the diagnosis of influenza from Nasopharyngeal swab specimens and should not be used as a sole basis for treatment. Nasal washings and aspirates are unacceptable for Xpert Xpress SARS-CoV-2/FLU/RSV testing.  Fact Sheet for Patients: EntrepreneurPulse.com.au  Fact Sheet for Healthcare Providers: IncredibleEmployment.be  This test is not yet approved or cleared by the [place] FDA and has been authorized for detection and/or diagnosis of SARS-CoV-2 by FDA under an Emergency Use Authorization (EUA). This EUA will remain in effect (meaning this test can be used) for the duration of the COVID-19 declaration under Section 564(b)(1) of the Act, 21 U.S.C. section 360bbb-3(b)(1), unless the authorization is terminated  or revoked.  Performed at [facility], [place] [address], [place], [place] [address]   Surgical pcr screen     Status: Abnormal   Collection Time: 09/14/20  9:20 PM   Specimen: Nasal Mucosa; Nasal Swab  Result Value Ref Range Status   MRSA, PCR NEGATIVE NEGATIVE Final   Staphylococcus aureus POSITIVE (A) NEGATIVE Final  Comment: (NOTE) The Xpert SA Assay (FDA approved for NASAL specimens in patients 26 years of age and older), is one component of a comprehensive surveillance program. It is not intended to diagnose infection nor to guide or monitor treatment. Performed at [facility], [place] [address], [place], [place] [address]     Radiology Studies: DG C-Arm 1-60 Min-No Report  Result Date: 09/16/2020 Fluoroscopy was utilized by the requesting physician.  No radiographic interpretation.     LOS: 6 days   Oren Binet, MD [facility]  09/16/2020, 12:36 PM

## 2020-09-17 ENCOUNTER — Inpatient Hospital Stay (HOSPITAL_COMMUNITY)
Admission: RE | Admit: 2020-09-17 | Discharge: 2020-10-07 | DRG: 092 | Disposition: A | Discharge status: Home Health | Payer: Medicare Other | Source: Intra-hospital | Attending: Physical Medicine and Rehabilitation | Admitting: Physical Medicine and Rehabilitation

## 2020-09-17 ENCOUNTER — Encounter (HOSPITAL_COMMUNITY): Payer: Self-pay | Admitting: Physical Medicine and Rehabilitation

## 2020-09-17 ENCOUNTER — Other Ambulatory Visit: Payer: Self-pay

## 2020-09-17 DIAGNOSIS — T8130XA Disruption of wound, unspecified, initial encounter: Secondary | ICD-10-CM | POA: Diagnosis not present

## 2020-09-17 DIAGNOSIS — Z7952 Long term (current) use of systemic steroids: Secondary | ICD-10-CM | POA: Diagnosis not present

## 2020-09-17 DIAGNOSIS — G8918 Other acute postprocedural pain: Secondary | ICD-10-CM | POA: Diagnosis present

## 2020-09-17 DIAGNOSIS — R509 Fever, unspecified: Secondary | ICD-10-CM | POA: Diagnosis not present

## 2020-09-17 DIAGNOSIS — E871 Hypo-osmolality and hyponatremia: Secondary | ICD-10-CM

## 2020-09-17 DIAGNOSIS — S72451D Displaced supracondylar fracture without intracondylar extension of lower end of right femur, subsequent encounter for closed fracture with routine healing: Secondary | ICD-10-CM | POA: Diagnosis not present

## 2020-09-17 DIAGNOSIS — W1830XD Fall on same level, unspecified, subsequent encounter: Secondary | ICD-10-CM | POA: Diagnosis not present

## 2020-09-17 DIAGNOSIS — D72823 Leukemoid reaction: Secondary | ICD-10-CM

## 2020-09-17 DIAGNOSIS — M5416 Radiculopathy, lumbar region: Secondary | ICD-10-CM | POA: Diagnosis not present

## 2020-09-17 DIAGNOSIS — M79604 Pain in right leg: Secondary | ICD-10-CM

## 2020-09-17 DIAGNOSIS — R5082 Postprocedural fever: Secondary | ICD-10-CM | POA: Diagnosis not present

## 2020-09-17 DIAGNOSIS — D62 Acute posthemorrhagic anemia: Secondary | ICD-10-CM

## 2020-09-17 DIAGNOSIS — N390 Urinary tract infection, site not specified: Secondary | ICD-10-CM | POA: Diagnosis not present

## 2020-09-17 DIAGNOSIS — Z8261 Family history of arthritis: Secondary | ICD-10-CM

## 2020-09-17 DIAGNOSIS — M21371 Foot drop, right foot: Secondary | ICD-10-CM | POA: Diagnosis present

## 2020-09-17 DIAGNOSIS — R2689 Other abnormalities of gait and mobility: Principal | ICD-10-CM | POA: Diagnosis present

## 2020-09-17 DIAGNOSIS — Z981 Arthrodesis status: Secondary | ICD-10-CM

## 2020-09-17 DIAGNOSIS — Z882 Allergy status to sulfonamides status: Secondary | ICD-10-CM

## 2020-09-17 DIAGNOSIS — I152 Hypertension secondary to endocrine disorders: Secondary | ICD-10-CM | POA: Diagnosis present

## 2020-09-17 DIAGNOSIS — Z90710 Acquired absence of both cervix and uterus: Secondary | ICD-10-CM

## 2020-09-17 DIAGNOSIS — J9811 Atelectasis: Secondary | ICD-10-CM | POA: Diagnosis not present

## 2020-09-17 DIAGNOSIS — R0989 Other specified symptoms and signs involving the circulatory and respiratory systems: Secondary | ICD-10-CM | POA: Diagnosis not present

## 2020-09-17 DIAGNOSIS — S92001D Unspecified fracture of right calcaneus, subsequent encounter for fracture with routine healing: Secondary | ICD-10-CM | POA: Diagnosis not present

## 2020-09-17 DIAGNOSIS — R7303 Prediabetes: Secondary | ICD-10-CM | POA: Diagnosis present

## 2020-09-17 DIAGNOSIS — R339 Retention of urine, unspecified: Secondary | ICD-10-CM | POA: Diagnosis present

## 2020-09-17 DIAGNOSIS — Z888 Allergy status to other drugs, medicaments and biological substances status: Secondary | ICD-10-CM

## 2020-09-17 DIAGNOSIS — M069 Rheumatoid arthritis, unspecified: Secondary | ICD-10-CM | POA: Diagnosis present

## 2020-09-17 DIAGNOSIS — R739 Hyperglycemia, unspecified: Secondary | ICD-10-CM | POA: Diagnosis not present

## 2020-09-17 DIAGNOSIS — F419 Anxiety disorder, unspecified: Secondary | ICD-10-CM | POA: Diagnosis present

## 2020-09-17 DIAGNOSIS — M797 Fibromyalgia: Secondary | ICD-10-CM | POA: Diagnosis present

## 2020-09-17 DIAGNOSIS — K5900 Constipation, unspecified: Secondary | ICD-10-CM | POA: Diagnosis present

## 2020-09-17 DIAGNOSIS — F32A Depression, unspecified: Secondary | ICD-10-CM | POA: Diagnosis present

## 2020-09-17 DIAGNOSIS — F418 Other specified anxiety disorders: Secondary | ICD-10-CM

## 2020-09-17 DIAGNOSIS — Y838 Other surgical procedures as the cause of abnormal reaction of the patient, or of later complication, without mention of misadventure at the time of the procedure: Secondary | ICD-10-CM | POA: Diagnosis not present

## 2020-09-17 DIAGNOSIS — M818 Other osteoporosis without current pathological fracture: Secondary | ICD-10-CM | POA: Diagnosis present

## 2020-09-17 DIAGNOSIS — Z96653 Presence of artificial knee joint, bilateral: Secondary | ICD-10-CM | POA: Diagnosis present

## 2020-09-17 DIAGNOSIS — M48062 Spinal stenosis, lumbar region with neurogenic claudication: Secondary | ICD-10-CM | POA: Diagnosis not present

## 2020-09-17 DIAGNOSIS — M48061 Spinal stenosis, lumbar region without neurogenic claudication: Secondary | ICD-10-CM | POA: Diagnosis not present

## 2020-09-17 DIAGNOSIS — E1159 Type 2 diabetes mellitus with other circulatory complications: Secondary | ICD-10-CM | POA: Diagnosis present

## 2020-09-17 DIAGNOSIS — T380X5A Adverse effect of glucocorticoids and synthetic analogues, initial encounter: Secondary | ICD-10-CM | POA: Diagnosis not present

## 2020-09-17 DIAGNOSIS — E042 Nontoxic multinodular goiter: Secondary | ICD-10-CM | POA: Diagnosis present

## 2020-09-17 DIAGNOSIS — Z9889 Other specified postprocedural states: Secondary | ICD-10-CM

## 2020-09-17 DIAGNOSIS — M4316 Spondylolisthesis, lumbar region: Secondary | ICD-10-CM

## 2020-09-17 DIAGNOSIS — Z79899 Other long term (current) drug therapy: Secondary | ICD-10-CM

## 2020-09-17 DIAGNOSIS — I10 Essential (primary) hypertension: Secondary | ICD-10-CM | POA: Diagnosis present

## 2020-09-17 LAB — URINALYSIS, COMPLETE (UACMP) WITH MICROSCOPIC
Bacteria, UA: NONE SEEN
Bilirubin Urine: NEGATIVE
Glucose, UA: NEGATIVE mg/dL
Hgb urine dipstick: NEGATIVE
Ketones, ur: NEGATIVE mg/dL
Leukocytes,Ua: NEGATIVE
Nitrite: NEGATIVE
Protein, ur: NEGATIVE mg/dL
Specific Gravity, Urine: 1.011 (ref 1.005–1.030)
pH: 7 (ref 5.0–8.0)

## 2020-09-17 MED ORDER — OXYCODONE HCL ER 10 MG PO T12A
10.0000 mg | EXTENDED_RELEASE_TABLET | Freq: Two times a day (BID) | ORAL | Status: DC
  Administered 2020-09-17 – 2020-10-06 (×38): 10 mg via ORAL
  Filled 2020-09-17 (×38): qty 1

## 2020-09-17 MED ORDER — ALUM & MAG HYDROXIDE-SIMETH 200-200-20 MG/5ML PO SUSP
30.0000 mL | ORAL | Status: DC | PRN
Start: 2020-09-17 — End: 2020-10-07

## 2020-09-17 MED ORDER — MUPIROCIN 2 % EX OINT
1.0000 "application " | TOPICAL_OINTMENT | Freq: Two times a day (BID) | CUTANEOUS | Status: AC
  Administered 2020-09-17 – 2020-09-19 (×4): 1 via NASAL
  Filled 2020-09-17: qty 22

## 2020-09-17 MED ORDER — HYDROCHLOROTHIAZIDE 25 MG PO TABS
25.0000 mg | ORAL_TABLET | Freq: Every day | ORAL | Status: DC
  Administered 2020-09-18 – 2020-09-22 (×5): 25 mg via ORAL
  Filled 2020-09-17 (×5): qty 1

## 2020-09-17 MED ORDER — SENNOSIDES-DOCUSATE SODIUM 8.6-50 MG PO TABS
2.0000 | ORAL_TABLET | Freq: Every day | ORAL | Status: DC
  Administered 2020-09-17 – 2020-10-06 (×20): 2 via ORAL
  Filled 2020-09-17 (×21): qty 2

## 2020-09-17 MED ORDER — BISACODYL 10 MG RE SUPP
10.0000 mg | Freq: Every day | RECTAL | Status: DC | PRN
Start: 2020-09-17 — End: 2020-10-07

## 2020-09-17 MED ORDER — DIPHENHYDRAMINE HCL 12.5 MG/5ML PO ELIX: 12.5000 mg | ORAL_SOLUTION | Freq: Four times a day (QID) | ORAL | Status: DC | PRN

## 2020-09-17 MED ORDER — ASCORBIC ACID 500 MG PO TABS
500.0000 mg | ORAL_TABLET | Freq: Every day | ORAL | Status: DC
  Administered 2020-09-18 – 2020-10-07 (×20): 500 mg via ORAL
  Filled 2020-09-17 (×20): qty 1

## 2020-09-17 MED ORDER — ENOXAPARIN SODIUM 40 MG/0.4ML IJ SOSY
40.0000 mg | PREFILLED_SYRINGE | INTRAMUSCULAR | Status: DC
  Administered 2020-09-17 – 2020-10-06 (×20): 40 mg via SUBCUTANEOUS
  Filled 2020-09-17 (×20): qty 0.4

## 2020-09-17 MED ORDER — POLYETHYLENE GLYCOL 3350 17 G PO PACK: 17.0000 g | PACK | Freq: Two times a day (BID) | ORAL | 0 refills | Status: DC

## 2020-09-17 MED ORDER — LOSARTAN POTASSIUM 50 MG PO TABS
100.0000 mg | ORAL_TABLET | Freq: Every day | ORAL | Status: DC
  Administered 2020-09-18 – 2020-10-07 (×20): 100 mg via ORAL
  Filled 2020-09-17 (×21): qty 2

## 2020-09-17 MED ORDER — PROCHLORPERAZINE EDISYLATE 10 MG/2ML IJ SOLN: 5.0000 mg | Freq: Four times a day (QID) | INTRAMUSCULAR | Status: DC | PRN

## 2020-09-17 MED ORDER — CALCIUM CARBONATE-VITAMIN D 500-200 MG-UNIT PO TABS
1.0000 | ORAL_TABLET | Freq: Two times a day (BID) | ORAL | Status: DC
  Administered 2020-09-17 – 2020-10-07 (×40): 1 via ORAL
  Filled 2020-09-17 (×41): qty 1

## 2020-09-17 MED ORDER — PREDNISONE 5 MG PO TABS
10.0000 mg | ORAL_TABLET | Freq: Every day | ORAL | Status: AC
  Administered 2020-09-19: 10 mg via ORAL
  Filled 2020-09-17: qty 2

## 2020-09-17 MED ORDER — PREDNISONE 5 MG PO TABS
10.0000 mg | ORAL_TABLET | Freq: Two times a day (BID) | ORAL | Status: AC
  Administered 2020-09-18 (×2): 10 mg via ORAL
  Filled 2020-09-17 (×2): qty 2

## 2020-09-17 MED ORDER — TRAMADOL HCL 50 MG PO TABS
50.0000 mg | ORAL_TABLET | Freq: Four times a day (QID) | ORAL | Status: DC | PRN
  Administered 2020-09-17 – 2020-09-22 (×3): 50 mg via ORAL
  Filled 2020-09-17 (×3): qty 1

## 2020-09-17 MED ORDER — FLEET ENEMA 7-19 GM/118ML RE ENEM
1.0000 | ENEMA | Freq: Once | RECTAL | Status: DC | PRN
Start: 2020-09-17 — End: 2020-10-07

## 2020-09-17 MED ORDER — POLYETHYLENE GLYCOL 3350 17 G PO PACK
17.0000 g | PACK | Freq: Two times a day (BID) | ORAL | Status: DC
  Administered 2020-09-17 – 2020-10-06 (×33): 17 g via ORAL
  Filled 2020-09-17 (×39): qty 1

## 2020-09-17 MED ORDER — VITAMIN E 180 MG (400 UNIT) PO CAPS
400.0000 [IU] | ORAL_CAPSULE | Freq: Every day | ORAL | Status: DC
  Administered 2020-09-18 – 2020-10-07 (×20): 400 [IU] via ORAL
  Filled 2020-09-17 (×20): qty 1

## 2020-09-17 MED ORDER — DIAZEPAM 5 MG PO TABS: 5.0000 mg | ORAL_TABLET | Freq: Four times a day (QID) | ORAL | Status: DC | PRN

## 2020-09-17 MED ORDER — PREDNISONE 5 MG PO TABS
5.0000 mg | ORAL_TABLET | Freq: Every day | ORAL | Status: DC
  Administered 2020-09-20 – 2020-10-07 (×18): 5 mg via ORAL
  Filled 2020-09-17 (×17): qty 1

## 2020-09-17 MED ORDER — PROCHLORPERAZINE MALEATE 5 MG PO TABS: 5.0000 mg | ORAL_TABLET | Freq: Four times a day (QID) | ORAL | Status: DC | PRN

## 2020-09-17 MED ORDER — PROCHLORPERAZINE 25 MG RE SUPP: 12.5000 mg | Freq: Four times a day (QID) | RECTAL | Status: DC | PRN

## 2020-09-17 MED ORDER — ACETAMINOPHEN 325 MG PO TABS
325.0000 mg | ORAL_TABLET | ORAL | Status: DC | PRN
  Administered 2020-09-18 – 2020-09-26 (×4): 650 mg via ORAL
  Filled 2020-09-17 (×4): qty 2

## 2020-09-17 MED ORDER — TRAZODONE HCL 50 MG PO TABS
25.0000 mg | ORAL_TABLET | Freq: Every evening | ORAL | Status: DC | PRN
  Filled 2020-09-17: qty 1

## 2020-09-17 MED ORDER — OXYCODONE HCL 5 MG PO TABS
5.0000 mg | ORAL_TABLET | ORAL | Status: DC | PRN
  Administered 2020-09-17: 10 mg via ORAL
  Administered 2020-09-18: 5 mg via ORAL
  Administered 2020-09-18 – 2020-09-19 (×2): 10 mg via ORAL
  Administered 2020-09-19: 5 mg via ORAL
  Administered 2020-09-20 – 2020-09-22 (×3): 10 mg via ORAL
  Filled 2020-09-17 (×4): qty 2
  Filled 2020-09-17: qty 1
  Filled 2020-09-17 (×2): qty 2
  Filled 2020-09-17: qty 1

## 2020-09-17 MED ORDER — CYCLOBENZAPRINE HCL 10 MG PO TABS
10.0000 mg | ORAL_TABLET | Freq: Three times a day (TID) | ORAL | Status: DC
  Administered 2020-09-17 – 2020-09-22 (×15): 10 mg via ORAL
  Filled 2020-09-17 (×17): qty 1

## 2020-09-17 MED ORDER — PREDNISONE 5 MG PO TABS
10.0000 mg | ORAL_TABLET | Freq: Three times a day (TID) | ORAL | Status: AC
  Administered 2020-09-17: 10 mg via ORAL
  Filled 2020-09-17: qty 2

## 2020-09-17 MED ORDER — BUPROPION HCL ER (XL) 300 MG PO TB24
300.0000 mg | ORAL_TABLET | Freq: Every day | ORAL | Status: DC
  Administered 2020-09-18 – 2020-10-07 (×20): 300 mg via ORAL
  Filled 2020-09-17 (×22): qty 1

## 2020-09-17 MED ORDER — GUAIFENESIN-DM 100-10 MG/5ML PO SYRP: 5.0000 mL | ORAL_SOLUTION | Freq: Four times a day (QID) | ORAL | Status: DC | PRN

## 2020-09-17 MED ORDER — POLYETHYLENE GLYCOL 3350 17 G PO PACK: 17.0000 g | PACK | Freq: Every day | ORAL | Status: DC | PRN

## 2020-09-17 MED ORDER — ESCITALOPRAM OXALATE 10 MG PO TABS
20.0000 mg | ORAL_TABLET | Freq: Every day | ORAL | Status: DC
  Administered 2020-09-18 – 2020-10-07 (×20): 20 mg via ORAL
  Filled 2020-09-17 (×20): qty 2

## 2020-09-17 MED ORDER — LEFLUNOMIDE 20 MG PO TABS
10.0000 mg | ORAL_TABLET | Freq: Every day | ORAL | Status: DC
  Administered 2020-09-18 – 2020-10-07 (×20): 10 mg via ORAL
  Filled 2020-09-17 (×21): qty 0.5

## 2020-09-17 NOTE — H&P (Signed)
Physical Medicine and Rehabilitation Admission H&P    Chief Complaint  Patient presents with   Functional deficits   Right hip/ankle fracture and LBP with radiculopathy s/p decompression.     HPI: Haley Roberts is a 75 year old female with history of RA-chronic steroids (Dr. Amil Amen), chronic LBP-on opioids, recent fall with nondisplaced periprosthetic fracture right medial femoral condyle and avulsion fracture right anterior calcaneus, progressive low back pain who was admitted on 09/09/2020 with pain radiating to bilateral buttocks with numbness and tingling.  MRI lumbar spine done revealing advanced facet arthropathy with degenerative anterolisthesis L4/5 with severe multifactorial spinal stenosis and question neural compression of central canal, facet arthropathy with degenerative anterolisthesis L3/4 and L5/S1.  Dr. Lannette Donath was consulted for input on lower extremity fractures and recommended knee immobilizer on right knee with NWB RLE and prednisone increased to 60 mg with taper for pain control. Patient initially decline surgery but continued to have limitations with mobility and pain with standing attempts.  Dr. Christella Noa consulted and patient elected to undergo L3/4 and L4/L5 decompressive lam on 08/31. Post op continues to be limited by NWB RLE, pain LB with numbness/tingling, balance deficits with anxiety as well as arthritic changes bilateral hands. CIR recommended due to functional decline.    Review of Systems  Constitutional:  Positive for fever (burning up). Negative for chills.  HENT:  Negative for congestion, hearing loss and tinnitus.   Eyes:  Negative for blurred vision.       Has cataracts  Respiratory:  Negative for cough and shortness of breath.   Cardiovascular:  Negative for chest pain, palpitations and leg swelling.  Gastrointestinal:  Positive for constipation. Negative for nausea and vomiting.  Genitourinary:  Negative for dysuria and urgency.   Musculoskeletal:  Positive for back pain and myalgias.  Skin:  Negative for rash.  Neurological:  Positive for sensory change (pain down to hips and legs.  Numbness BLE.) and weakness (right foot drop new).    Past Medical History:  Diagnosis Date   Anemia    Anxiety    Depression    Fibromyalgia    Hyperglycemia    HYPERGLYCEMIA, FASTING 08/22/2007   Hypertension    Multiple thyroid nodules    gets ultra sound yearly on thryoid   Rheumatoid arthritis(714.0)     Past Surgical History:  Procedure Laterality Date   ABDOMINAL HYSTERECTOMY     with BSO   BREAST BIOPSY     JOINT REPLACEMENT     right knee replacement 2012   TOTAL KNEE ARTHROPLASTY Left 07/07/2014   Procedure: LEFT TOTAL KNEE ARTHROPLASTY;  Surgeon: Paralee Cancel, MD;  Location: [facility];  Service: Orthopedics;  Laterality: Left;    Family History  Problem Relation Age of Onset   Cancer Mother 11       breast   Arthritis Father    Hypertension Father    Diabetes Sister    Hypertension Brother    Diabetes Son    Depression Son     Social History: 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 Son has been driving her and picks up groceries.  Husband has ALS and family caring for him at current time.  Reports that she has never smoked. She has never used smokeless tobacco. She reports that she does not drink alcohol and does not use drugs.    Allergies  Allergen Reactions   Infliximab Shortness Of Breath, Other (See Comments) and Hypertension    Remicade   Sulfa Antibiotics Other (  See Comments)    Convulsions    Sulfacetamide Sodium-Sulfur Other (See Comments)    Convulsions   Sulfonamide Derivatives Other (See Comments)    BAD HEADACHES/CHILLS/FEVER and CONVULSIONS with Sulfa antibiotics and Sulfacetamide Sodium-Sulfur    Medications Prior to Admission  Medication Sig Dispense Refill   buPROPion (WELLBUTRIN XL) 300 MG 24 hr tablet Take 300 mg by mouth daily.     Calcium Carbonate-Vit D-Min (CALTRATE PLUS PO) Take 1  tablet by mouth in the morning.     escitalopram (LEXAPRO) 20 MG tablet Take 20 mg by mouth daily.     hydrochlorothiazide (HYDRODIURIL) 12.5 MG tablet Take 1 tablet (12.5 mg total) by mouth daily. 90 tablet 3   HYDROcodone-acetaminophen (NORCO) 10-325 MG tablet Take 1 tablet by mouth 3 (three) times daily as needed (for pain).     leflunomide (ARAVA) 10 MG tablet Take 10 mg by mouth daily.     losartan (COZAAR) 100 MG tablet Take 1 tablet (100 mg total) by mouth daily. 90 tablet 3   methocarbamol (ROBAXIN) 500 MG tablet Take 1 tablet (500 mg total) by mouth every 6 (six) hours as needed for muscle spasms. 50 tablet 0   Multiple Vitamins-Minerals (WOMENS DAILY FORMULA) TABS Take 1 tablet by mouth daily.       predniSONE (DELTASONE) 5 MG tablet Take 5 mg by mouth daily.     vitamin C (ASCORBIC ACID) 500 MG tablet Take 500 mg by mouth daily.       vitamin E 400 UNIT capsule Take 400 Units by mouth daily.       benzonatate (TESSALON PERLES) 100 MG capsule Take 1 capsule (100 mg total) by mouth 3 (three) times daily as needed. (Patient not taking: No sig reported) 20 capsule 0    Drug Regimen Review  Drug regimen was reviewed and remains appropriate with no significant issues identified  Home: Home Living Family/patient expects to be discharged to:: Private residence Living Arrangements: Spouse/significant other Available Help at Discharge: Family, Available PRN/intermittently Type of Home: House Home Access: Ramped entrance Home Layout: Two level Alternate Level Stairs-Number of Steps: has stair lift from husband with ALS Bathroom Shower/Tub: Multimedia programmer: Handicapped height Home Equipment: Environmental consultant - 2 wheels, Bedside commode, Shower seat, Grab bars - toilet, Grab bars - tub/shower, Wheelchair - manual Additional Comments: pt's spouse has ALS with caregivers, pt able to use his w/c, has stair lift, home is w/c accesible   Functional History: Prior Function Level of  Independence: Independent Comments: Pt reports was independent and one of spouse's caregivers, but has been in w/c or bed since 8/21 due to severe pain  Functional Status:  Mobility: Bed Mobility Overal bed mobility: Needs Assistance Bed Mobility: Supine to Sit, Sit to Supine Rolling: Max assist Sidelying to sit: Total assist Sit to supine: Max assist General bed mobility comments: patient has very little ability to move the R leg with KI, and is quite weak with L leg. Transfers Overall transfer level: Needs assistance Transfers: Lateral/Scoot Transfers  Lateral/Scoot Transfers: Max assist, +2 physical assistance, From elevated surface General transfer comment: no +2 so just sat EOB. Ambulation/Gait General Gait Details: Unable    ADL: ADL Overall ADL's : Needs assistance/impaired Eating/Feeding: Set up, Sitting Grooming: Wash/dry hands, Wash/dry face, Set up, Sitting Grooming Details (indicate cue type and reason): supported sitting. Upper Body Dressing : Minimal assistance, Sitting Lower Body Dressing: Maximal assistance, Bed level Toileting- Clothing Manipulation and Hygiene: Maximal assistance, Bed level Functional mobility  during ADLs: Maximal assistance, +2 for safety/equipment, +2 for physical assistance  Cognition: Cognition Overall Cognitive Status: Within Functional Limits for tasks assessed Orientation Level: Oriented X4 Cognition Arousal/Alertness: Awake/alert Behavior During Therapy: Anxious Overall Cognitive Status: Within Functional Limits for tasks assessed General Comments: very fearful of sliding out of bed, or falling.   Blood pressure 132/86, pulse 90, temperature (!) 101.1 F (38.4 C), temperature source Oral, resp. rate 18, height '5\' 3"'$  (1.6 m), weight 74.4 kg, SpO2 98 %. Physical Exam Vitals and nursing note reviewed.  Constitutional:      Comments: Flushed appearing and reports feeling hot.   HENT:     Head: Normocephalic and atraumatic.      Right Ear: External ear normal.     Left Ear: External ear normal.     Mouth/Throat:     Mouth: Mucous membranes are moist.     Pharynx: Oropharynx is clear.  Eyes:     Extraocular Movements: Extraocular movements intact.     Pupils: Pupils are equal, round, and reactive to light.  Cardiovascular:     Rate and Rhythm: Normal rate and regular rhythm.     Heart sounds: No murmur heard.   No gallop.  Pulmonary:     Effort: Pulmonary effort is normal. No respiratory distress.     Breath sounds: No wheezing.  Abdominal:     General: There is distension.     Comments: Bowel sounds a little high pitched.   Musculoskeletal:     Comments: Chronic RA changes in both hands and feet to a lesser extent. Heel cords a little tight.   Skin:    General: Skin is warm.     Comments: Back incision with honeycomb dressing.   Neurological:     Mental Status: She is alert and oriented to person, place, and time.     Comments: Alert and oriented x 3. Normal insight and awareness. Intact Memory. Normal language and speech. Cranial nerve exam unremarkable. Motor 5/5 in UE and 3/5 prox LLE to distal 4-/5 d/t pain inhibition. RLE limited by KI. Sensory loss in stocking glove distribution in both legs from just above ankles. DTR's 1+  Psychiatric:        Mood and Affect: Mood normal.        Behavior: Behavior normal.    Results for orders placed or performed during the hospital encounter of 09/09/20 (from the past 48 hour(s))  Prepare RBC (crossmatch)     Status: None   Collection Time: 09/15/20  8:47 PM  Result Value Ref Range   Order Confirmation      ORDER PROCESSED BY BLOOD BANK Performed at [facility], [place] [address] , [place] [address]   POCT I-Stat EG7     Status: Abnormal   Collection Time: 09/15/20  9:28 PM  Result Value Ref Range   pH, Ven 7.318 7.250 - 7.430   pCO2, Ven 47.8 44.0 - 60.0 mmHg   pO2, Ven 41.0 32.0 - 45.0 mmHg   Bicarbonate 24.5 20.0 - 28.0 mmol/L   TCO2 26  22 - 32 mmol/L   O2 Saturation 72.0 %   Acid-base deficit 2.0 0.0 - 2.0 mmol/L   Sodium 141 135 - 145 mmol/L   Potassium 3.7 3.5 - 5.1 mmol/L   Calcium, Ion 1.09 (L) 1.15 - 1.40 mmol/L   HCT 33.0 (L) 36.0 - 46.0 %   Hemoglobin 11.2 (L) 12.0 - 15.0 g/dL   Sample type VENOUS   CBC  Status: Abnormal   Collection Time: 09/16/20  4:38 AM  Result Value Ref Range   WBC 12.2 (H) 4.0 - 10.5 K/uL   RBC 3.77 (L) 3.87 - 5.11 MIL/uL   Hemoglobin 11.0 (L) 12.0 - 15.0 g/dL   HCT 34.4 (L) 36.0 - 46.0 %   MCV 91.2 80.0 - 100.0 fL   MCH 29.2 26.0 - 34.0 pg   MCHC 32.0 30.0 - 36.0 g/dL   RDW 14.7 11.5 - 15.5 %   Platelets 363 150 - 400 K/uL   nRBC 0.0 0.0 - 0.2 %    Comment: Performed at [facility], [place] [address] [place], [place] [address]  Creatinine, serum     Status: None   Collection Time: 09/16/20  4:38 AM  Result Value Ref Range   Creatinine, Ser 0.64 0.44 - 1.00 mg/dL   GFR, Estimated >60 >60 mL/min    Comment: (NOTE) Calculated using the CKD-EPI Creatinine Equation (2021) Performed at [facility] [address] [place], [place] [address]    DG C-Arm 1-60 Min-No Report  Result Date: 09/16/2020 Fluoroscopy was utilized by the requesting physician.  No radiographic interpretation.       Medical Problem List and Plan: 1.  Functional and mobility deficits secondary to lumbar stenosis with previous supracondylar right femur fx and right calcaneus fx after fall  -patient may shower  -ELOS/Goals: 18-20 days, goals min to mod assist with PT and OT 2.  Antithrombotics: -DVT/anticoagulation:  Mechanical: Sequential compression devices, below knee Bilateral lower extremities  -antiplatelet therapy: N/A 3. Pain Management: Used hydrocodone 10 mg TID PTA. On prednisone taper with Oxycontin 10 mg bid and oxycodone prn.   --Flexeril TID, Gabapentin  4. Mood: Team to provide ego support.   -antipsychotic agents: N/A 5. Neuropsych: This patient is capable of making  decisions on her own behalf. 6. Skin/Wound Care: Routine pressure relief measures. Monitor incision for healing.  7. Fluids/Electrolytes/Nutrition:  Monitor I/O.  --Check lytes in am. May need potassium supplement with HCTZ on board.  8. HTN: Monitor BP TID--on HCTZ and Cozaar 9. RA: Continue Arava daily with chronic prednisone (higher doses with taper currently).  10. H/o depression: On Wellbutrin and Lexapro.  11. Leukocytosis: Likely due to steroid affect--monitor for fevers and other signs of infection.   --T 101 at noon  -check urinalysis and culture given urine retention  -consider cxr, but no sx on exam or hx  -wounds look ok 12. Pre-diabetes: Hyperglycemia due to increase in steroids. Will monitor FBS for now.  13. Hypocalcemia: Add Calcium supplement.  14. Constipation: Will add Senna S two tabs with supper 15. Urinary retention: Has not urinated since foley came out early am/last pm? ?neurogenic bladder.  --will check voiding with PVR checks. Work on constipation relief.      Bary Leriche, PA-C 09/17/2020

## 2020-09-17 NOTE — Progress Notes (Signed)
Physical Therapy Treatment Patient Details Name: Haley Roberts MRN: [id number] DOB: 15-Jun-1945 Today's Date: 09/17/2020    History of Present Illness Haley Roberts is a 75 y.o. female who presents with concerns of worsening lower back pain. Patient reports that for the past several months she has had gradual worsening of her lower back pain. She also recently was evaluated in the ED on 8/21 for a fall and was found to have nondisplaced Peri-prosthetic fx of the right medial femoral condyle and right foot avulsion fracture of the anterior calcaneus- now with KI and NWB RLE; follow-up with [facility]. MRI of the lumbar spine showing facet arthropathy and degenerative anterior listhesis of multiple levels.  Pt underwent L3-L5 PLIF on 8/31.  PMH: chronic back pain on chronic opioid, RA, fibromyalgia, and HTN    PT Comments    Pt received in supine, agreeable to therapy session with encouragement. Pt self-limiting due to anxiety and fear of falls, session focus on pursed-lip breathing to calm anxiety prior to transfers, seated balance training and seated scooting along EOB. Pt currently performing lateral scooting with +86mxA to totalA, unable to lift hips off bed (likely limited also due to IV on back of L hand). Pt continues to benefit from PT services to progress toward functional mobility goals.   Follow Up Recommendations  CIR;Supervision for mobility/OOB     Equipment Recommendations  Other (comment) (TBA pending progress; currently wheelchair with removable arm/leg rests and cushion)    Recommendations for Other Services Rehab consult     Precautions / Restrictions Precautions Precautions: Fall Precaution Comments: Fear of falls, anxiety, back precautions (pt unable to verbalize any of 3) Required Braces or Orthoses: Knee Immobilizer - Right Knee Immobilizer - Right: On at all times Restrictions Weight Bearing Restrictions: Yes RLE Weight Bearing: Non weight bearing     Mobility  Bed Mobility Overal bed mobility: Needs Assistance Bed Mobility: Rolling;Sidelying to Sit;Sit to Sidelying Rolling: Mod assist;+2 for physical assistance Sidelying to sit: Max assist;+2 for physical assistance     Sit to sidelying: Max assist;Total assist;+2 for physical assistance General bed mobility comments: pt very anxious to perform but with cues/increased time able to assist slightly with advancing LLE and to raise trunk but afraid to let go of rail with LUE when transitioning to Rt side of bed. Max cues for slow, deep breaths and technique within back precautions    Transfers Overall transfer level: Needs assistance   Transfers: Lateral/Scoot Transfers          Lateral/Scoot Transfers: Max assist;+2 physical assistance;From elevated surface General transfer comment: heavy multimodal cues for technique, pt tending to lean backward despite cues to keep upright posture and tending to lean to R (toward bed rail) although it is too far away to use appropriately. Pt unable to raise hips off bed when scooting and reports decreased BUE strength; pt anxious throughout and afraid to attempt, maxA +2 with bed pad, one therapist in front and tech behind her for safety  Ambulation/Gait                 Stairs             Wheelchair Mobility    Modified Rankin (Stroke Patients Only)       Balance Overall balance assessment: Needs assistance Sitting-balance support: Bilateral upper extremity supported;Feet supported Sitting balance-Leahy Scale: Poor Sitting balance - Comments: relies on UE support and external support for balance- modA with fatigue but initially min guard to minA.  Pt impulsively leaning anterior when encourage to sit straight upright instead of leaning backward and pt yelling out "What happened? What just happened?", she may need vestibular assessment vs orthostatic assessment, unclear if she is dizzy or just anxious. Postural control:  Posterior lean;Right lateral lean     Standing balance comment: NT, unsafe to assess pt too anxious to attempt          Cognition Arousal/Alertness: Awake/alert Behavior During Therapy: Anxious Overall Cognitive Status: Within Functional Limits for tasks assessed                General Comments: very fearful of sliding out of bed, or falling, pt anxious and on one occasion impulsively leaning forward into PTA thigh and gripping around therapist leg tightly; needs max cues for pursed-lip breathing and requesting meds for anxiety if possible      Exercises Other Exercises Other Exercises: supine RLE AROM: ankle pumps x10 reps Other Exercises: supine LLE AROM: ankle pumps, heel slides, hip abduction x5-10 reps ea    General Comments General comments (skin integrity, edema, etc.): HR/SpO2 WFL on RA, pt self-limiting due to anxiety and may need vestibular assessment (unable to assess orthostatics as no dinamap in room and unsafe to step away from pt once sitting up)      Pertinent Vitals/Pain Pain Assessment: 0-10 Pain Score: 5  Faces Pain Scale: Hurts whole lot Pain Location: mid and low back> RLE Pain Descriptors / Indicators: Pins and needles;Numbness;Grimacing;Guarding;Sharp;Spasm Pain Intervention(s): Limited activity within patient's tolerance;Monitored during session;Premedicated before session;Repositioned     PT Goals (current goals can now be found in the care plan section) Acute Rehab PT Goals Patient Stated Goal: Go to rehab for a couple days and work on balance so I don't fall. PT Goal Formulation: With patient Time For Goal Achievement: 09/30/20 Progress towards PT goals: Progressing toward goals    Frequency    Min 5X/week      PT Plan Current plan remains appropriate       AM-PAC PT "6 Clicks" Mobility   Outcome Measure  Help needed turning from your back to your side while in a flat bed without using bedrails?: A Lot Help needed moving from  lying on your back to sitting on the side of a flat bed without using bedrails?: Total Help needed moving to and from a bed to a chair (including a wheelchair)?: Total Help needed standing up from a chair using your arms (e.g., wheelchair or bedside chair)?: Total Help needed to walk in hospital room?: Total Help needed climbing 3-5 steps with a railing? : Total 6 Click Score: 7    End of Session Equipment Utilized During Treatment: Right knee immobilizer Activity Tolerance: Other (comment) (pt anxious/fear of falls limiting transfer training) Patient left: in bed;with call bell/phone within reach;with bed alarm set Nurse Communication: Mobility status;Other (comment);Weight bearing status (pt requesting medication for her anxiety) PT Visit Diagnosis: Other abnormalities of gait and mobility (R26.89);Difficulty in walking, not elsewhere classified (R26.2)     Time: 1207-1229 PT Time Calculation (min) (ACUTE ONLY): 22 min  Charges:  $Therapeutic Activity: 8-22 mins                     Olliver Boyadjian P., PTA Acute Rehabilitation Services Pager: [phone] Office: [place] 09/17/2020, 2:31 PM

## 2020-09-17 NOTE — TOC Transition Note (Signed)
Transition of Care [facility]) - CM/SW Discharge Note   Patient Details  Name: JIANNI MEZGER MRN: [id number] Date of Birth: 05/28/1945  Transition of Care [facility]) CM/SW Contact:  Pollie Friar, RN Phone Number: 09/17/2020, 12:24 PM   Clinical Narrative:    Patient is discharging to CIR today. CM signing off.   Final next level of care: IP Rehab Facility Barriers to Discharge: No Barriers Identified   Patient Goals and CMS Choice        Discharge Placement                       Discharge Plan and Services                                     Social Determinants of Health (SDOH) Interventions     Readmission Risk Interventions No flowsheet data found.

## 2020-09-17 NOTE — Progress Notes (Signed)
Per pharmacy request, asked family members to check if could bring in leflunomide 10 mg because 20 mg here difficult to cut accurately in half. Son agreed to check at home.   Patient tried Ultram 50 mg for pain but was not effective, oxy IR 10 given.

## 2020-09-17 NOTE — H&P (Signed)
Physical Medicine and Rehabilitation Admission H&P        Chief Complaint  Patient presents with   Functional deficits   Right hip/ankle fracture and LBP with radiculopathy s/p decompression.       HPI: Haley Roberts is a 75 year old female with history of RA-chronic steroids (Dr. Amil Amen), chronic LBP-on opioids, recent fall with nondisplaced periprosthetic fracture right medial femoral condyle and avulsion fracture right anterior calcaneus, progressive low back pain who was admitted on 09/09/2020 with pain radiating to bilateral buttocks with numbness and tingling.  MRI lumbar spine done revealing advanced facet arthropathy with degenerative anterolisthesis L4/5 with severe multifactorial spinal stenosis and question neural compression of central canal, facet arthropathy with degenerative anterolisthesis L3/4 and L5/S1.   Dr. Lannette Donath was consulted for input on lower extremity fractures and recommended knee immobilizer on right knee with NWB RLE and prednisone increased to 60 mg with taper for pain control. Patient initially decline surgery but continued to have limitations with mobility and pain with standing attempts.  Dr. Christella Noa consulted and patient elected to undergo L3/4 and L4/L5 decompressive lam on 08/31. Post op continues to be limited by NWB RLE, pain LB with numbness/tingling, balance deficits with anxiety as well as arthritic changes bilateral hands. CIR recommended due to functional decline.      Review of Systems  Constitutional:  Positive for fever (burning up). Negative for chills.  HENT:  Negative for congestion, hearing loss and tinnitus.   Eyes:  Negative for blurred vision.       Has cataracts  Respiratory:  Negative for cough and shortness of breath.   Cardiovascular:  Negative for chest pain, palpitations and leg swelling.  Gastrointestinal:  Positive for constipation. Negative for nausea and vomiting.  Genitourinary:  Negative for dysuria and urgency.   Musculoskeletal:  Positive for back pain and myalgias.  Skin:  Negative for rash.  Neurological:  Positive for sensory change (pain down to hips and legs.  Numbness BLE.) and weakness (right foot drop new).          Past Medical History:  Diagnosis Date   Anemia     Anxiety     Depression     Fibromyalgia     Hyperglycemia     HYPERGLYCEMIA, FASTING 08/22/2007   Hypertension     Multiple thyroid nodules      gets ultra sound yearly on thryoid   Rheumatoid arthritis(714.0)             Past Surgical History:  Procedure Laterality Date   ABDOMINAL HYSTERECTOMY        with BSO   BREAST BIOPSY       JOINT REPLACEMENT        right knee replacement 2012   TOTAL KNEE ARTHROPLASTY Left 07/07/2014    Procedure: LEFT TOTAL KNEE ARTHROPLASTY;  Surgeon: Paralee Cancel, MD;  Location: [facility];  Service: Orthopedics;  Laterality: Left;           Family History  Problem Relation Age of Onset   Cancer Mother 65        breast   Arthritis Father     Hypertension Father     Diabetes Sister     Hypertension Brother     Diabetes Son     Depression Son        Social History: 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 Son has been driving her and picks up groceries.  Husband has ALS and family caring for him  at current time.  Reports that she has never smoked. She has never used smokeless tobacco. She reports that she does not drink alcohol and does not use drugs.          Allergies  Allergen Reactions   Infliximab Shortness Of Breath, Other (See Comments) and Hypertension      Remicade   Sulfa Antibiotics Other (See Comments)      Convulsions     Sulfacetamide Sodium-Sulfur Other (See Comments)      Convulsions   Sulfonamide Derivatives Other (See Comments)      BAD HEADACHES/CHILLS/FEVER and CONVULSIONS with Sulfa antibiotics and Sulfacetamide Sodium-Sulfur            Medications Prior to Admission  Medication Sig Dispense Refill   buPROPion (WELLBUTRIN XL) 300 MG 24 hr tablet Take 300 mg  by mouth daily.       Calcium Carbonate-Vit D-Min (CALTRATE PLUS PO) Take 1 tablet by mouth in the morning.       escitalopram (LEXAPRO) 20 MG tablet Take 20 mg by mouth daily.       hydrochlorothiazide (HYDRODIURIL) 12.5 MG tablet Take 1 tablet (12.5 mg total) by mouth daily. 90 tablet 3   HYDROcodone-acetaminophen (NORCO) 10-325 MG tablet Take 1 tablet by mouth 3 (three) times daily as needed (for pain).       leflunomide (ARAVA) 10 MG tablet Take 10 mg by mouth daily.       losartan (COZAAR) 100 MG tablet Take 1 tablet (100 mg total) by mouth daily. 90 tablet 3   methocarbamol (ROBAXIN) 500 MG tablet Take 1 tablet (500 mg total) by mouth every 6 (six) hours as needed for muscle spasms. 50 tablet 0   Multiple Vitamins-Minerals (WOMENS DAILY FORMULA) TABS Take 1 tablet by mouth daily.         predniSONE (DELTASONE) 5 MG tablet Take 5 mg by mouth daily.       vitamin C (ASCORBIC ACID) 500 MG tablet Take 500 mg by mouth daily.         vitamin E 400 UNIT capsule Take 400 Units by mouth daily.         benzonatate (TESSALON PERLES) 100 MG capsule Take 1 capsule (100 mg total) by mouth 3 (three) times daily as needed. (Patient not taking: No sig reported) 20 capsule 0      Drug Regimen Review  Drug regimen was reviewed and remains appropriate with no significant issues identified   Home: Home Living Family/patient expects to be discharged to:: Private residence Living Arrangements: Spouse/significant other Available Help at Discharge: Family, Available PRN/intermittently Type of Home: House Home Access: Ramped entrance Home Layout: Two level Alternate Level Stairs-Number of Steps: has stair lift from husband with ALS Bathroom Shower/Tub: Multimedia programmer: Handicapped height Home Equipment: Environmental consultant - 2 wheels, Bedside commode, Shower seat, Grab bars - toilet, Grab bars - tub/shower, Wheelchair - manual Additional Comments: pt's spouse has ALS with caregivers, pt able to use his  w/c, has stair lift, home is w/c accesible   Functional History: Prior Function Level of Independence: Independent Comments: Pt reports was independent and one of spouse's caregivers, but has been in w/c or bed since 8/21 due to severe pain   Functional Status:  Mobility: Bed Mobility Overal bed mobility: Needs Assistance Bed Mobility: Supine to Sit, Sit to Supine Rolling: Max assist Sidelying to sit: Total assist Sit to supine: Max assist General bed mobility comments: patient has very little ability to move the  R leg with KI, and is quite weak with L leg. Transfers Overall transfer level: Needs assistance Transfers: Lateral/Scoot Transfers  Lateral/Scoot Transfers: Max assist, +2 physical assistance, From elevated surface General transfer comment: no +2 so just sat EOB. Ambulation/Gait General Gait Details: Unable   ADL: ADL Overall ADL's : Needs assistance/impaired Eating/Feeding: Set up, Sitting Grooming: Wash/dry hands, Wash/dry face, Set up, Sitting Grooming Details (indicate cue type and reason): supported sitting. Upper Body Dressing : Minimal assistance, Sitting Lower Body Dressing: Maximal assistance, Bed level Toileting- Clothing Manipulation and Hygiene: Maximal assistance, Bed level Functional mobility during ADLs: Maximal assistance, +2 for safety/equipment, +2 for physical assistance   Cognition: Cognition Overall Cognitive Status: Within Functional Limits for tasks assessed Orientation Level: Oriented X4 Cognition Arousal/Alertness: Awake/alert Behavior During Therapy: Anxious Overall Cognitive Status: Within Functional Limits for tasks assessed General Comments: very fearful of sliding out of bed, or falling.     Blood pressure 132/86, pulse 90, temperature (!) 101.1 F (38.4 C), temperature source Oral, resp. rate 18, height '5\' 3"'$  (1.6 m), weight 74.4 kg, SpO2 98 %. Physical Exam Vitals and nursing note reviewed.  Constitutional:      Comments:  Flushed appearing and reports feeling hot.   HENT:     Head: Normocephalic and atraumatic.     Right Ear: External ear normal.     Left Ear: External ear normal.     Mouth/Throat:     Mouth: Mucous membranes are moist.     Pharynx: Oropharynx is clear.  Eyes:     Extraocular Movements: Extraocular movements intact.     Pupils: Pupils are equal, round, and reactive to light.  Cardiovascular:     Rate and Rhythm: Normal rate and regular rhythm.     Heart sounds: No murmur heard.   No gallop.  Pulmonary:     Effort: Pulmonary effort is normal. No respiratory distress.     Breath sounds: No wheezing.  Abdominal:     General: There is distension.     Comments: Bowel sounds a little high pitched.   Musculoskeletal:     Comments: Chronic RA changes in both hands and feet to a lesser extent. Heel cords a little tight.   Skin:    General: Skin is warm.     Comments: Back incision with honeycomb dressing.   Neurological:     Mental Status: She is alert and oriented to person, place, and time.     Comments: Alert and oriented x 3. Normal insight and awareness. Intact Memory. Normal language and speech. Cranial nerve exam unremarkable. Motor 5/5 in UE and 3/5 prox LLE to distal 4-/5 d/t pain inhibition. RLE limited by KI. Sensory loss in stocking glove distribution in both legs from just above ankles. DTR's 1+  Psychiatric:        Mood and Affect: Mood normal.        Behavior: Behavior normal.      Lab Results Last 48 Hours        Results for orders placed or performed during the hospital encounter of 09/09/20 (from the past 48 hour(s))  Prepare RBC (crossmatch)     Status: None    Collection Time: 09/15/20  8:47 PM  Result Value Ref Range    Order Confirmation          ORDER PROCESSED BY BLOOD BANK Performed at [facility], [address], [place], [place] [address]    POCT I-Stat EG7     Status:  Abnormal    Collection Time: 09/15/20  9:28 PM  Result Value Ref Range    pH,  Ven 7.318 7.250 - 7.430    pCO2, Ven 47.8 44.0 - 60.0 mmHg    pO2, Ven 41.0 32.0 - 45.0 mmHg    Bicarbonate 24.5 20.0 - 28.0 mmol/L    TCO2 26 22 - 32 mmol/L    O2 Saturation 72.0 %    Acid-base deficit 2.0 0.0 - 2.0 mmol/L    Sodium 141 135 - 145 mmol/L    Potassium 3.7 3.5 - 5.1 mmol/L    Calcium, Ion 1.09 (L) 1.15 - 1.40 mmol/L    HCT 33.0 (L) 36.0 - 46.0 %    Hemoglobin 11.2 (L) 12.0 - 15.0 g/dL    Sample type VENOUS    CBC     Status: Abnormal    Collection Time: 09/16/20  4:38 AM  Result Value Ref Range    WBC 12.2 (H) 4.0 - 10.5 K/uL    RBC 3.77 (L) 3.87 - 5.11 MIL/uL    Hemoglobin 11.0 (L) 12.0 - 15.0 g/dL    HCT 34.4 (L) 36.0 - 46.0 %    MCV 91.2 80.0 - 100.0 fL    MCH 29.2 26.0 - 34.0 pg    MCHC 32.0 30.0 - 36.0 g/dL    RDW 14.7 11.5 - 15.5 %    Platelets 363 150 - 400 K/uL    nRBC 0.0 0.0 - 0.2 %      Comment: Performed at [facility], [address] [place], [place] [address]  Creatinine, serum     Status: None    Collection Time: 09/16/20  4:38 AM  Result Value Ref Range    Creatinine, Ser 0.64 0.44 - 1.00 mg/dL    GFR, Estimated >60 >60 mL/min      Comment: (NOTE) Calculated using the CKD-EPI Creatinine Equation (2021) Performed at [facility] [address], [place], [place] [address]         Imaging Results (Last 48 hours)  DG C-Arm 1-60 Min-No Report   Result Date: 09/16/2020 Fluoroscopy was utilized by the requesting physician.  No radiographic interpretation.             Medical Problem List and Plan: 1.  Functional and mobility deficits secondary to lumbar stenosis with previous supracondylar right femur fx and right calcaneus fx after fall             -patient may shower             -ELOS/Goals: 18-20 days, goals min to mod assist with PT and OT 2.  Antithrombotics: -DVT/anticoagulation:  Mechanical: Sequential compression devices, below knee Bilateral lower extremities             -antiplatelet therapy: N/A 3. Pain  Management: Used hydrocodone 10 mg TID PTA. On prednisone taper with Oxycontin 10 mg bid and oxycodone prn.              --Flexeril TID, Gabapentin  4. Mood: Team to provide ego support.              -antipsychotic agents: N/A 5. Neuropsych: This patient is capable of making decisions on her own behalf. 6. Skin/Wound Care: Routine pressure relief measures. Monitor incision for healing.  7. Fluids/Electrolytes/Nutrition:  Monitor I/O.  --Check lytes in am. May need potassium supplement with HCTZ on board.  8. HTN: Monitor BP TID--on HCTZ and Cozaar 9. RA: Continue Arava daily  with chronic prednisone (higher doses with taper currently).  10. H/o depression: On Wellbutrin and Lexapro.  11. Leukocytosis: Likely due to steroid affect--monitor for fevers and other signs of infection.              --T 101 at noon             -check urinalysis and culture given urine retention             -consider cxr, but no sx on exam or hx             -wounds look ok 12. Pre-diabetes: Hyperglycemia due to increase in steroids. Will monitor FBS for now.  13. Hypocalcemia: Add Calcium supplement.  14. Constipation: Will add Senna S two tabs with supper 15. Urinary retention: Has not urinated since foley came out early am/last pm? ?neurogenic bladder.             --will check voiding with PVR checks. Work on constipation relief.        Bary Leriche, PA-C 09/17/2020   I have personally performed a face to face diagnostic evaluation of this patient and formulated the key components of the plan.  Additionally, I have personally reviewed laboratory data, imaging studies, as well as relevant notes and concur with the physician assistant's documentation above.  The patient's status has not changed from the original H&P.  Any changes in documentation from the acute care chart have been noted above.  Meredith Staggers, MD, [facility]

## 2020-09-17 NOTE — Progress Notes (Signed)
Occupational Therapy Treatment Patient Details Name: Haley Roberts MRN: [id number] DOB: 10/22/1945 Today's Date: 09/17/2020    History of present illness Haley Roberts is a 75 y.o. female who presents with concerns of worsening lower back pain. Patient reports that for the past several months she has had gradual worsening of her lower back pain. This became more unbearable in the past week. She also recently was evaluated in the ED on 8/21 for a fall and was found to have nondisplaced Henrene Pastor prosthetic fracture of the right medial femoral condyle and right foot avulsion fracture of the anterior calcaneus- Knee immobilizer nonweightbearing follow-up with [facility]. MRI of the lumbar spine showing facet arthropathy and degenerative anterior listhesis of multiple levels.  Pt underwent L3-L5 PLIF on 8/31.   PMH: chronic back pain on chronic opioid, RA, fibromyalgia, and HTN   OT comments  Patient with incremental progress toward patient goals.  Patient is quite fearful of sliding out of the bed, and/or falling.  Her fear is a big barrier.  She is also unrealistic regarding how significant her deficits are, and how much rehab she needs.  She was unable to lateral scoot her bottom along the bed, and needed near total assist for sit to supine.  She is pending CIR admit, but acute OT will continue to follow.    Follow Up Recommendations  CIR    Equipment Recommendations  None recommended by OT    Recommendations for Other Services Rehab consult    Precautions / Restrictions Precautions Precautions: Fall Required Braces or Orthoses: Knee Immobilizer - Right Knee Immobilizer - Right: On at all times Restrictions Weight Bearing Restrictions: No RLE Weight Bearing: Non weight bearing       Mobility Bed Mobility Overal bed mobility: Needs Assistance Bed Mobility: Supine to Sit;Sit to Supine Rolling: Max assist Sidelying to sit: Total assist       General bed mobility comments:  patient has very little ability to move the R leg with KI, and is quite weak with L leg. Patient Response: Anxious;Restless  Transfers                 General transfer comment: no +2 so just sat EOB.    Balance Overall balance assessment: Needs assistance Sitting-balance support: Bilateral upper extremity supported;Feet supported Sitting balance-Leahy Scale: Poor Sitting balance - Comments: relies on UE support and external support for balance- mod to max assist as she fatigued Postural control: Posterior lean     Standing balance comment: NT                                                Cognition Arousal/Alertness: Awake/alert Behavior During Therapy: Anxious Overall Cognitive Status: Within Functional Limits for tasks assessed                                 General Comments: very fearful of sliding out of bed, or falling.                          Pertinent Vitals/ Pain       Pain Assessment: Faces Faces Pain Scale: Hurts whole lot Pain Location: low back to bil hips, RLE Pain Descriptors / Indicators: Pins and needles;Numbness;Grimacing;Guarding;Sharp;Spasm Pain Intervention(s): Monitored during session  Frequency  Min 2X/week        Progress Toward Goals  OT Goals(current goals can now be found in the care plan section)  Progress towards OT goals: Progressing toward goals  Acute Rehab OT Goals Patient Stated Goal: Go to rehab for a couple days OT Goal Formulation: With patient Time For Goal Achievement: 09/30/20 Potential to Achieve Goals: Good  Plan Discharge plan remains appropriate    Co-evaluation                 AM-PAC OT "6 Clicks" Daily Activity     Outcome Measure   Help from another person eating meals?: None Help from another person taking care of personal grooming?: A Little Help from another person  toileting, which includes using toliet, bedpan, or urinal?: Total Help from another person bathing (including washing, rinsing, drying)?: A Lot Help from another person to put on and taking off regular upper body clothing?: A Lot Help from another person to put on and taking off regular lower body clothing?: Total 6 Click Score: 13    End of Session Equipment Utilized During Treatment: Right knee immobilizer  OT Visit Diagnosis: Unsteadiness on feet (R26.81);Muscle weakness (generalized) (M62.81);Pain Pain - Right/Left: Right Pain - part of body: Leg   Activity Tolerance Patient limited by fatigue   Patient Left in bed;with call bell/phone within reach   Nurse Communication          Time: [id number] OT Time Calculation (min): 18 min  Charges: OT General Charges $OT Visit: 1 Visit OT Treatments $Therapeutic Activity: 8-22 mins  09/17/2020  RP, OTR/L  Acute Rehabilitation Services  Office:  [phone]    Metta Clines 09/17/2020, 11:43 AM

## 2020-09-17 NOTE — Discharge Summary (Signed)
PATIENT DETAILS Name: Haley Roberts Age: 75 y.o. Sex: female Date of Birth: 1945-11-16 MRN: [id number]. Admitting Physician: Ashok Pall, MD [id number], Claudina Lick, MD  Admit Date: 09/09/2020 Discharge date: 09/17/2020  Recommendations for Outpatient Follow-up:  Follow up with PCP in 1-2 weeks Please obtain CMP/CBC in one week Please ensure follow-up with neurosurgery, orthopedics  Admitted From:  Home  Disposition: [place]: No  Equipment/Devices: None  Discharge Condition: Stable  CODE STATUS: FULL CODE  Diet recommendation:  Diet Order             Diet - low sodium heart healthy           Diet regular Room service appropriate? Yes; Fluid consistency: Thin  Diet effective now                   Brief Narrative: 75 year old female with history of chronic back pain on narcotics, RA on prednisone, fibromyalgia, HTN-who recently had a ED visit following a mechanical fall-she was found to have a nondisplaced periprosthetic fracture of the right medial femoral condyle and a right foot avulsion fracture-discharged home with plans for outpatient orthopedic follow-up-presented to the ED on 8/5 with intractable back pain.  MRI of the lumbar sacral spine showed severe spinal stenosis at L3/4 and L4/5-due to significant degenerative arthritis.  Evaluated by neurosurgery and underwent decompressive spine surgery on 8/31  Brief Hospital Course: Intractable acute on chronic low back pain due to severe spinal stenosis: S/p decompression by neurosurgery on 8/31-pain relatively well controlled today with just oral narcotics-evaluated by PT/OT with recommendations for CIR.  Spoke with Dr. Fonnie Birkenhead morning-okay to discharge to CIR from his point of view as well.   Rheumatoid arthritis/fibromyalgia: No flare-on prednisone/leflunomide    Hypertension: BP stable-continue HCTZ, losartan-follow and adjust.     Depression: stable on bupropion and Lexapro    Fall abt 2 wks PTA with non displaced right periprosthetic fracture of the medial femoral condyle with right foot avulsion fractures of the anterior calcaneus: Appreciate orthopedic input-recommendations are to continue knee immobilizer-and limited weightbearing on the right leg    Normocytic anemia: Probably related to underlying RA-no indication for PRBC transfusion.   Overweight Body mass index is 29.06 kg/m.    Procedures/  8/31>> Lumbar Three-Four, Lumbar Four-Five Posterior lumbar interbody fusion   Discharge Diagnoses:  Principal Problem:   Intractable low back pain Active Problems:   Depression   Essential hypertension   Rheumatoid arthritis ([place])   Uncontrolled pain   Spondylolisthesis of lumbar region   Discharge Instructions:  Activity:  As tolerated with Full fall precautions use walker/cane & assistance as needed   Discharge Instructions     Diet - low sodium heart healthy   Complete by: As directed    Increase activity slowly   Complete by: As directed    No wound care   Complete by: As directed       Allergies as of 09/17/2020       Reactions   Infliximab Shortness Of Breath, Other (See Comments), Hypertension   Remicade   Sulfa Antibiotics Other (See Comments)   Convulsions   Sulfacetamide Sodium-sulfur Other (See Comments)   Convulsions   Sulfonamide Derivatives Other (See Comments)   BAD HEADACHES/CHILLS/FEVER and CONVULSIONS with Sulfa antibiotics and Sulfacetamide Sodium-Sulfur        Medication List     STOP taking these medications    benzonatate 100 MG capsule Commonly known as: Best boy  TAKE these medications    buPROPion 300 MG 24 hr tablet Commonly known as: WELLBUTRIN XL Take 300 mg by mouth daily.   CALTRATE PLUS PO Take 1 tablet by mouth in the morning.   escitalopram 20 MG tablet Commonly known as: LEXAPRO Take 20 mg by mouth daily.   hydrochlorothiazide 12.5 MG tablet Commonly known as:  HYDRODIURIL Take 1 tablet (12.5 mg total) by mouth daily.   HYDROcodone-acetaminophen 10-325 MG tablet Commonly known as: NORCO Take 1 tablet by mouth 3 (three) times daily as needed (for pain).   leflunomide 10 MG tablet Commonly known as: ARAVA Take 10 mg by mouth daily.   losartan 100 MG tablet Commonly known as: COZAAR Take 1 tablet (100 mg total) by mouth daily.   methocarbamol 500 MG tablet Commonly known as: ROBAXIN Take 1 tablet (500 mg total) by mouth every 6 (six) hours as needed for muscle spasms.   polyethylene glycol 17 g packet Commonly known as: MIRALAX / GLYCOLAX Take 17 g by mouth 2 (two) times daily.   predniSONE 5 MG tablet Commonly known as: DELTASONE Take 5 mg by mouth daily.   vitamin C 500 MG tablet Commonly known as: ASCORBIC ACID Take 500 mg by mouth daily.   vitamin E 180 MG (400 UNITS) capsule Take 400 Units by mouth daily.   Womens Daily Formula Tabs Take 1 tablet by mouth daily.        Follow-up Information     Binnie Rail, MD. Call in 1 week(s).   Specialty: Internal Medicine Contact information: [place] [address] [phone]         Paralee Cancel, MD. Schedule an appointment as soon as possible for a visit in 2 week(s).   Specialty: Orthopedic Surgery Why: Recheck right knee, nondisplaced medial femoral condyle fracture Contact information: [address] [place] [address] [id number]         Ashok Pall, MD. Schedule an appointment as soon as possible for a visit in 1 week(s).   Specialty: Neurosurgery Contact information: [address] [place] [address] [phone]                Allergies  Allergen Reactions   Infliximab Shortness Of Breath, Other (See Comments) and Hypertension    Remicade   Sulfa Antibiotics Other (See Comments)    Convulsions    Sulfacetamide Sodium-Sulfur Other (See Comments)    Convulsions   Sulfonamide  Derivatives Other (See Comments)    BAD HEADACHES/CHILLS/FEVER and CONVULSIONS with Sulfa antibiotics and Sulfacetamide Sodium-Sulfur      Consultations: Orthopedics, neurosurgery  Other Procedures/Studies: DG Pelvis 1-2 Views  Result Date: 09/09/2020 CLINICAL DATA:  Fall on 09/05/2020 with known femur fracture and pelvic pain, initial encounter EXAM: PELVIS - 1 VIEW COMPARISON:  None. FINDINGS: Pelvic ring is intact. Degenerative changes of lumbar spine and hip joints are seen. No acute fracture or dislocation is noted. IMPRESSION: Degenerative change without acute abnormality. Electronically Signed   By: Inez Catalina M.D.   On: 09/09/2020 23:09   DG Knee 1-2 Views Right  Result Date: 09/10/2020 CLINICAL DATA:  Periprosthetic distal femoral fracture, inability to bear weight EXAM: RIGHT KNEE - 1-2 VIEW COMPARISON:  09/05/2020 FINDINGS: Frontal and cross-table lateral views of the right knee are obtained. The minimally displaced periprosthetic fracture involving the medial femoral condyle is again noted and unchanged. The right knee arthroplasty is in stable position. Small joint effusion. Progressive subcutaneous edema. IMPRESSION:  1. Stable periprosthetic fracture of the medial femoral condyle. 2. Increasing soft tissue edema. 3. Small joint effusion. Electronically Signed   By: Randa Ngo M.D.   On: 09/10/2020 18:18   DG Tibia/Fibula Right  Result Date: 09/05/2020 CLINICAL DATA:  Fall 1 week ago. Persistent right leg pain and swelling. EXAM: RIGHT TIBIA AND FIBULA - 2 VIEW COMPARISON:  None. FINDINGS: Fracture suggested of the superior aspect of the medial femoral condyle above the femoral component of the total knee prosthesis. No other evidence of a fracture. Specifically, no evidence of a tibia or fibular fracture. Knee prosthetic components appear well seated and aligned. Ankle joint normally aligned. There is subcutaneous soft tissue edema extending from the knee through the ankle.  IMPRESSION: 1. Possible acute fracture of the medial femoral condyle. This would be better evaluated with knee radiographs. 2. No other evidence of a fracture. No dislocation. No evidence of loosening of the knee joint orthopedic hardware. 3. Nonspecific soft tissue edema. Electronically Signed   By: Lajean Manes M.D.   On: 09/05/2020 16:40   DG Ankle Complete Right  Result Date: 09/05/2020 CLINICAL DATA:  [place] 1 week ago.  Right lower leg pain and swelling. EXAM: RIGHT ANKLE - COMPLETE 3+ VIEW COMPARISON:  None. FINDINGS: No fracture or bone lesion. Ankle joint is normally spaced and aligned. There is diffuse soft tissue swelling that extends into the foot. IMPRESSION: 1. No fracture.  No ankle joint abnormality. 2. Nonspecific soft tissue swelling the ankle through the foot. Electronically Signed   By: Lajean Manes M.D.   On: 09/05/2020 16:37   CT Knee Right Wo Contrast  Result Date: 09/05/2020 CLINICAL DATA:  Fracture of the right medial femoral condyle and possible fracture of the lateral condyle seen on x-ray today. EXAM: CT OF THE right KNEE WITHOUT CONTRAST TECHNIQUE: Multidetector CT imaging of the right knee was performed according to the standard protocol. Multiplanar CT image reconstructions were also generated. COMPARISON:  Right knee radiographs 09/05/2020 FINDINGS: Bones/Joint/Cartilage Right total knee arthroplasty with patellar femoral component. Streak artifact arising from the arthroplasty components limits evaluation. There is evidence of focal cortical disruption with an oblique periprosthetic fracture involving the medial femoral condyle as seen on prior radiographs. No definite fracture identified in the lateral femoral condyle. No dislocation of the knee joint or patella. Moderate effusion. Ligaments Suboptimally assessed by CT. Muscles and Tendons Limit evaluation.  No gross abnormalities identified. Soft tissues Diffuse soft tissue edema. IMPRESSION: 1. Right total knee  arthroplasty. 2. Nondisplaced periprosthetic fracture of the medial femoral condyle. 3. Moderate effusion. Electronically Signed   By: Lucienne Capers M.D.   On: 09/05/2020 21:17   MR LUMBAR SPINE WO CONTRAST  Result Date: 09/09/2020 CLINICAL DATA:  Low back pain for greater than 6 weeks. Recent fall. EXAM: MRI LUMBAR SPINE WITHOUT CONTRAST TECHNIQUE: Multiplanar, multisequence MR imaging of the lumbar spine was performed. No intravenous contrast was administered. COMPARISON:  Radiography 06/28/2020 FINDINGS: Segmentation:  5 lumbar type vertebral bodies. Alignment: 3 mm degenerative anterolisthesis L3-4. 9 mm degenerative anterolisthesis L4-5. 5 mm degenerative anterolisthesis L5-S1. one or 2 mm of retrolisthesis at T12-L1, L1-2 and L2-3. Vertebrae:  No fracture or focal bone lesion. Conus medullaris and cauda equina: Conus extends to the L1 level. Conus and cauda equina appear normal. Paraspinal and other soft tissues: Negative Disc levels: Mild bulging of the discs at T11-12, T12-L1, L1-2 and L2-3. No compressive stenosis of the canal or foramina. L3-4: Facet arthropathy with 3 mm of  anterolisthesis. Bulging of the disc. Stenosis of the lateral recesses and neural foramina that could possibly be symptomatic. Facet arthropathy would likely contribute to back pain. L4-5: Facet arthropathy with 9 mm of anterolisthesis. Pseudo disc herniation. Severe spinal stenosis likely to cause neural compression on both sides. L5-S1: Facet arthropathy with 5 mm of anterolisthesis. Pseudo disc herniation. Stenosis of the subarticular lateral recesses and of the intervertebral foramen on the left. Neural compression is possible at this level, particularly affecting the left L5 nerve. IMPRESSION: L4-5: Advanced facet arthropathy with degenerative anterolisthesis of 9 mm. Severe multifactorial spinal stenosis which could cause neural compression in the central canal as well as in both neural foramina. L5-S1: Facet arthropathy  with degenerative anterolisthesis of 5 mm. Narrowing of the subarticular lateral recesses and of the intervertebral foramen on the left. Left L5 nerve could be compressed. L3-4: Facet arthropathy with 3 mm of anterolisthesis. Mild stenosis of the lateral recesses and neural foramina could possibly be symptomatic. Obviously, the facet arthropathy at these levels could contribute to low back pain. Electronically Signed   By: Nelson Chimes M.D.   On: 09/09/2020 20:26   US Venous Img Lower Right (DVT Study)  Result Date: 09/05/2020 CLINICAL DATA:  Right leg swelling EXAM: RIGHT LOWER EXTREMITY VENOUS DOPPLER ULTRASOUND TECHNIQUE: Gray-scale sonography with compression, as well as color and duplex ultrasound, were performed to evaluate the deep venous system(s) from the level of the common femoral vein through the popliteal and proximal calf veins. COMPARISON:  None. FINDINGS: VENOUS Normal compressibility of the common femoral, superficial femoral, and popliteal veins, as well as the visualized calf veins. Visualized portions of profunda femoral vein and great saphenous vein unremarkable. No filling defects to suggest DVT on grayscale or color Doppler imaging. Doppler waveforms show normal direction of venous flow, normal respiratory plasticity and response to augmentation. Limited views of the contralateral common femoral vein are unremarkable. OTHER None. Limitations: none IMPRESSION: No evidence of right lower extremity DVT. Electronically Signed   By: Rolm Baptise M.D.   On: 09/05/2020 17:22   DG Knee Complete 4 Views Right  Result Date: 09/05/2020 CLINICAL DATA:  RIGHT knee pain after falling 1 week ago. EXAM: RIGHT KNEE - COMPLETE 4+ VIEW COMPARISON:  None FINDINGS: Patient has had prior RIGHT knee arthroplasty. The femoral and tibial components appear well seated. There is linear lucency and step-off along the MEDIAL femoral condyle, suspicious for nondisplaced fracture. Possible LATERAL femoral condyles  fracture. Small joint effusion noted. IMPRESSION: 1. Suspect minimally displaced fracture along the MEDIAL femoral condyle. Possible fracture along the LATERAL condyle. 2. Small effusion. Electronically Signed   By: Nolon Nations M.D.   On: 09/05/2020 17:48   DG Foot Complete Right  Result Date: 09/05/2020 CLINICAL DATA:  One week of leg swelling following fall. RIGHT lower leg pain and swelling in a 75 year old female. EXAM: RIGHT FOOT COMPLETE - 3+ VIEW COMPARISON:  Prior MRI dated March of 2012. FINDINGS: Severe first metatarsophalangeal osteoarthritic changes. Marked midfoot degenerative changes about the tarsal metatarsal joints. Diffuse soft tissue swelling about the foot and ankle. Soft tissue swelling worse over the forefoot. Avulsion fracture along the lateral calcaneus on the AP view. IMPRESSION: 1. Avulsion fracture from the anterior calcaneus compatible with extensor digitorum brevis avulsion best seen on the AP view. 2. Severe first metatarsophalangeal and midfoot degenerative changes. 3. Diffuse soft tissue swelling, correlate with signs of infection. Electronically Signed   By: Zetta Bills M.D.   On: 09/05/2020 16:46  DG C-Arm 1-60 Min-No Report  Result Date: 09/16/2020 Fluoroscopy was utilized by the requesting physician.  No radiographic interpretation.     TODAY-DAY OF DISCHARGE:  Subjective:   Shantrel Capellan today has no headache,no chest abdominal pain,no new weakness tingling or numbness, feels much better wants to go home today.   Objective:   Blood pressure 137/80, pulse 84, temperature 98.6 F (37 C), temperature source Oral, resp. rate 18, height '5\' 3"'$  (1.6 m), weight 74.4 kg, SpO2 95 %.  Intake/Output Summary (Last 24 hours) at 09/17/2020 1144 Last data filed at 09/17/2020 0900 Gross per 24 hour  Intake 240 ml  Output 2425 ml  Net -2185 ml   Filed Weights   09/10/20 1502  Weight: 74.4 kg    Exam: Awake Alert, Oriented *3, No new F.N deficits, Normal  affect [place].AT,PERRAL Supple Neck,No JVD, No cervical lymphadenopathy appriciated.  Symmetrical Chest wall movement, Good air movement bilaterally, CTAB RRR,No Gallops,Rubs or new Murmurs, No Parasternal Heave +ve B.Sounds, Abd Soft, Non tender, No organomegaly appriciated, No rebound -guarding or rigidity. No Cyanosis, Clubbing or edema, No new Rash or bruise   PERTINENT RADIOLOGIC STUDIES: DG C-Arm 1-60 Min-No Report  Result Date: 09/16/2020 Fluoroscopy was utilized by the requesting physician.  No radiographic interpretation.     PERTINENT LAB RESULTS: CBC: Recent Labs    09/15/20 2128 09/16/20 0438  WBC  --  12.2*  HGB 11.2* 11.0*  HCT 33.0* 34.4*  PLT  --  363   CMET CMP     Component Value Date/Time   NA 141 09/15/2020 2128   NA 140 03/02/2016 0000   K 3.7 09/15/2020 2128   CL 103 09/13/2020 0409   CO2 29 09/13/2020 0409   GLUCOSE 134 (H) 09/13/2020 0409   BUN 18 09/13/2020 0409   BUN 21 03/02/2016 0000   CREATININE 0.64 09/16/2020 0438   CALCIUM 9.3 09/13/2020 0409   PROT 8.2 05/28/2015 1430   ALBUMIN 4.5 05/28/2015 1430   AST 30 03/02/2016 0000   ALT 19 03/02/2016 0000   ALKPHOS 94 03/02/2016 0000   BILITOT 0.3 05/28/2015 1430   GFRNONAA >60 09/16/2020 0438   GFRAA >60 07/08/2014 0432    GFR Estimated Creatinine Clearance: 60.5 mL/min (by C-G formula based on SCr of 0.64 mg/dL). No results for input(s): LIPASE, AMYLASE in the last 72 hours. No results for input(s): CKTOTAL, CKMB, CKMBINDEX, TROPONINI in the last 72 hours. Invalid input(s): POCBNP No results for input(s): DDIMER in the last 72 hours. No results for input(s): HGBA1C in the last 72 hours. No results for input(s): CHOL, HDL, LDLCALC, TRIG, CHOLHDL, LDLDIRECT in the last 72 hours. No results for input(s): TSH, T4TOTAL, T3FREE, THYROIDAB in the last 72 hours.  Invalid input(s): FREET3 No results for input(s): VITAMINB12, FOLATE, FERRITIN, TIBC, IRON, RETICCTPCT in the last 72  hours. Coags: No results for input(s): INR in the last 72 hours.  Invalid input(s): PT Microbiology: Recent Results (from the past 240 hour(s))  Resp Panel by RT-PCR (Flu A&B, Covid) Nasopharyngeal Swab     Status: None   Collection Time: 09/09/20 10:22 PM   Specimen: Nasopharyngeal Swab; Nasopharyngeal(NP) swabs in vial transport medium  Result Value Ref Range Status   SARS Coronavirus 2 by RT PCR NEGATIVE NEGATIVE Final    Comment: (NOTE) SARS-CoV-2 target nucleic acids are NOT DETECTED.  The SARS-CoV-2 RNA is generally detectable in upper respiratory specimens during the acute phase of infection. The lowest concentration of SARS-CoV-2 viral copies this assay  can detect is 138 copies/mL. A negative result does not preclude SARS-Cov-2 infection and should not be used as the sole basis for treatment or other patient management decisions. A negative result may occur with  improper specimen collection/handling, submission of specimen other than nasopharyngeal swab, presence of viral mutation(s) within the areas targeted by this assay, and inadequate number of viral copies(<138 copies/mL). A negative result must be combined with clinical observations, patient history, and epidemiological information. The expected result is Negative.  Fact Sheet for Patients:  EntrepreneurPulse.com.au  Fact Sheet for Healthcare Providers:  IncredibleEmployment.be  This test is no t yet approved or cleared by the [place] FDA and  has been authorized for detection and/or diagnosis of SARS-CoV-2 by FDA under an Emergency Use Authorization (EUA). This EUA will remain  in effect (meaning this test can be used) for the duration of the COVID-19 declaration under Section 564(b)(1) of the Act, 21 U.S.C.section 360bbb-3(b)(1), unless the authorization is terminated  or revoked sooner.       Influenza A by PCR NEGATIVE NEGATIVE Final   Influenza B by PCR NEGATIVE  NEGATIVE Final    Comment: (NOTE) The Xpert Xpress SARS-CoV-2/FLU/RSV plus assay is intended as an aid in the diagnosis of influenza from Nasopharyngeal swab specimens and should not be used as a sole basis for treatment. Nasal washings and aspirates are unacceptable for Xpert Xpress SARS-CoV-2/FLU/RSV testing.  Fact Sheet for Patients: EntrepreneurPulse.com.au  Fact Sheet for Healthcare Providers: IncredibleEmployment.be  This test is not yet approved or cleared by the [place] FDA and has been authorized for detection and/or diagnosis of SARS-CoV-2 by FDA under an Emergency Use Authorization (EUA). This EUA will remain in effect (meaning this test can be used) for the duration of the COVID-19 declaration under Section 564(b)(1) of the Act, 21 U.S.C. section 360bbb-3(b)(1), unless the authorization is terminated or revoked.  Performed at [facility], [place] [address] [place], [place] [address]   Surgical pcr screen     Status: Abnormal   Collection Time: 09/14/20  9:20 PM   Specimen: Nasal Mucosa; Nasal Swab  Result Value Ref Range Status   MRSA, PCR NEGATIVE NEGATIVE Final   Staphylococcus aureus POSITIVE (A) NEGATIVE Final    Comment: (NOTE) The Xpert SA Assay (FDA approved for NASAL specimens in patients 3 years of age and older), is one component of a comprehensive surveillance program. It is not intended to diagnose infection nor to guide or monitor treatment. Performed at [facility], [place] [address], [place], [place] [address]     FURTHER DISCHARGE INSTRUCTIONS:  Get Medicines reviewed and adjusted: Please take all your medications with you for your next visit with your Primary MD  Laboratory/radiological data: Please request your Primary MD to go over all hospital tests and procedure/radiological results at the follow up, please ask your Primary MD to get all Hospital records sent to his/her  office.  In some cases, they will be blood work, cultures and biopsy results pending at the time of your discharge. Please request that your primary care M.D. goes through all the records of your hospital data and follows up on these results.  Also Note the following: If you experience worsening of your admission symptoms, develop shortness of breath, life threatening emergency, suicidal or homicidal thoughts you must seek medical attention immediately by calling 911 or calling your MD immediately  if symptoms less severe.  You must read complete instructions/literature along with all the possible adverse reactions/side  effects for all the Medicines you take and that have been prescribed to you. Take any new Medicines after you have completely understood and accpet all the possible adverse reactions/side effects.   Do not drive when taking Pain medications or sleeping medications (Benzodaizepines)  Do not take more than prescribed Pain, Sleep and Anxiety Medications. It is not advisable to combine anxiety,sleep and pain medications without talking with your primary care practitioner  Special Instructions: If you have smoked or chewed Tobacco  in the last 2 yrs please stop smoking, stop any regular Alcohol  and or any Recreational drug use.  Wear Seat belts while driving.  Please note: You were cared for by a hospitalist during your hospital stay. Once you are discharged, your primary care physician will handle any further medical issues. Please note that NO REFILLS for any discharge medications will be authorized once you are discharged, as it is imperative that you return to your primary care physician (or establish a relationship with a primary care physician if you do not have one) for your post hospital discharge needs so that they can reassess your need for medications and monitor your lab values.  Total Time spent coordinating discharge including counseling, education and face to face time  equals 35 minutes.  SignedOren Binet 09/17/2020 11:44 AM

## 2020-09-17 NOTE — PMR Pre-admission (Signed)
PMR Admission Coordinator Pre-Admission Assessment  Patient: Haley Roberts is an 75 y.o., female MRN: [id number] DOB: September 28, 1945 Height: 5' 3" (160 cm) Weight: 74.4 kg  Insurance Information HMO:     PPO:      PCP:      IPA:      80/20: yes     OTHER:  PRIMARY: medicare Part A and B    effective 02/25/7987   Policy#: [id number]      Subscriber: Pt Phone#: Verified online    Fax#:  Pre-Cert#:       Employer:  Benefits:  Phone #:      Name:  Eff. Date: Parts A ad B effective  Deduct: $1556      Out of Pocket Max:  None      Life Max: N/A  CIR: 100%      SNF: 100 days Outpatient: 80%     Co-Pay: 20% Home Health: 100%      Co-Pay: none DME: 80%     Co-Pay: 20% Providers: patient's choice SECONDARY: BCBS Supplement       Policy#:[id number]      Phone#:   Development worker, community:       Phone#:   The Engineer, petroleum" for patients in Inpatient Rehabilitation Facilities with attached "Privacy Act [place] Records" was provided and verbally reviewed with: Patient  Emergency Contact Information Contact Information     Name Relation Home Work Mobile   Haley, Roberts Spouse [phone]     Haley, Roberts [phone]  [phone]   Haley, Roberts [phone]   Roberts,Haley Son [phone]       Current Medical History  Patient Admitting Diagnosis:Intractable back pain s/p :L 3-5 PLIF with  recent R periprostethic femoral and R calcaneus fractures  History of Present Illness: Haley Roberts is a 76 y.o. female with medical history significant for chronic back pain on chronic opioid, rheumatoid arthritis on chronic prednisone, fibromyalgia, and hypertension who presents with concerns of worsening lower back pain. Patient presented to the ED 09/09/20  reporting that for the past several months she has had gradual worsening of her lower back pain.  This became more unbearable in the  past week prior to admission. .  She also recently was evaluated in the ED on 8/21 for a fall and was found to have nondisplaced Henrene Pastor prosthetic fracture of the right medial femoral condyle and right foot avulsion fracture of the anterior calcaneus. On admission, Pt. Stated that the pain is in her lower lumbar area and radiates to bilateral buttocks.    No urinary or bowel incontinence.  She had planned appointment with Dr. Vertell Limber of neurosurgery and spine today but was not able to make it to her appointment due to increased pain. MRI of the lumbar spine showing facet arthropathy and degenerative anterior listhesis of multiple levels. Dr. Christella Noa with neurosurgery performed L3-L5 PLIF on 8/31. CIR was consulted to assist in return to [facility].    Patient's medical record from [facility] has been reviewed by the rehabilitation admission coordinator and physician.  Past Medical History  Past Medical History:  Diagnosis Date   Anemia    Anxiety    Depression    Fibromyalgia    Hyperglycemia    HYPERGLYCEMIA, FASTING 08/22/2007   Hypertension    Multiple thyroid nodules    gets ultra sound yearly on thryoid   Rheumatoid arthritis(714.0)     Has the patient had major  surgery during 100 days prior to admission? Yes  Family History   family history includes Arthritis in her father; Cancer (age of onset: 68) in her mother; Depression in her son; Diabetes in her sister and son; Hypertension in her brother and father.  Current Medications  Current Facility-Administered Medications:    0.9 %  sodium chloride infusion, 10 mL/hr, Intravenous, Once, Derinda Sis D, CRNA   0.9 %  sodium chloride infusion, 250 mL, Intravenous, Continuous, Cabbell, Kyle, MD   0.9 % NaCl with KCl 20 mEq/ L  infusion, , Intravenous, Continuous, Ashok Pall, MD, Last Rate: 80 mL/hr at 09/16/20 0643, Infusion Verify at 09/16/20 5929   acetaminophen (TYLENOL) tablet 650 mg, 650 mg, Oral, Q4H PRN **OR**  acetaminophen (TYLENOL) suppository 650 mg, 650 mg, Rectal, Q4H PRN, Ashok Pall, MD   alum & mag hydroxide-simeth (MAALOX/MYLANTA) 200-200-20 MG/5ML suspension 30 mL, 30 mL, Oral, Q4H PRN, Ashok Pall, MD, 30 mL at 09/12/20 0038   ascorbic acid (VITAMIN C) tablet 500 mg, 500 mg, Oral, Daily, Ashok Pall, MD, 500 mg at 09/17/20 1025   bisacodyl (DULCOLAX) suppository 10 mg, 10 mg, Rectal, Daily PRN, Ghimire, Henreitta Leber, MD   buPROPion (WELLBUTRIN XL) 24 hr tablet 300 mg, 300 mg, Oral, Daily, Ashok Pall, MD, 300 mg at 09/17/20 1025   Chlorhexidine Gluconate Cloth 2 % PADS 6 each, 6 each, Topical, Daily, Ashok Pall, MD, 6 each at 09/16/20 1030   cyclobenzaprine (FLEXERIL) tablet 10 mg, 10 mg, Oral, TID, Ashok Pall, MD, 10 mg at 09/17/20 1025   diazepam (VALIUM) tablet 5 mg, 5 mg, Oral, Q6H PRN, Ashok Pall, MD   docusate sodium (COLACE) capsule 100 mg, 100 mg, Oral, Daily, Ashok Pall, MD, 100 mg at 09/17/20 1025   escitalopram (LEXAPRO) tablet 20 mg, 20 mg, Oral, Daily, Ashok Pall, MD, 20 mg at 09/17/20 1025   heparin injection 5,000 Units, 5,000 Units, Subcutaneous, Q8H, Ashok Pall, MD, 5,000 Units at 09/17/20 0510   hydrochlorothiazide (HYDRODIURIL) tablet 25 mg, 25 mg, Oral, Daily, Ashok Pall, MD, 25 mg at 09/17/20 1025   leflunomide (ARAVA) tablet 10 mg, 10 mg, Oral, Daily, Ashok Pall, MD, 10 mg at 09/17/20 1026   losartan (COZAAR) tablet 100 mg, 100 mg, Oral, Daily, Ashok Pall, MD, 100 mg at 09/17/20 1025   menthol-cetylpyridinium (CEPACOL) lozenge 3 mg, 1 lozenge, Oral, PRN **OR** phenol (CHLORASEPTIC) mouth spray 1 spray, 1 spray, Mouth/Throat, PRN, Ashok Pall, MD   morphine 2 MG/ML injection 2 mg, 2 mg, Intravenous, Q3H PRN, Tu, Ching T, DO, 2 mg at 09/15/20 1643   morphine 2 MG/ML injection 2 mg, 2 mg, Intravenous, Q2H PRN, Ashok Pall, MD   mupirocin ointment (BACTROBAN) 2 % 1 application, 1 application, Nasal, BID, Ashok Pall, MD, 1 application at  24/46/28 1033   ondansetron (ZOFRAN) tablet 4 mg, 4 mg, Oral, Q6H PRN **OR** ondansetron (ZOFRAN) injection 4 mg, 4 mg, Intravenous, Q6H PRN, Ashok Pall, MD   oxyCODONE (Oxy IR/ROXICODONE) immediate release tablet 10 mg, 10 mg, Oral, Q3H PRN, Ashok Pall, MD, 10 mg at 09/17/20 0809   oxyCODONE (Oxy IR/ROXICODONE) immediate release tablet 5 mg, 5 mg, Oral, Q3H PRN, Ashok Pall, MD, 5 mg at 09/16/20 0905   oxyCODONE (OXYCONTIN) 12 hr tablet 10 mg, 10 mg, Oral, Q12H, Cabbell, Kyle, MD, 10 mg at 09/16/20 2220   oxyCODONE-acetaminophen (PERCOCET/ROXICET) 5-325 MG per tablet 2 tablet, 2 tablet, Oral, Q4H PRN, Tu, Ching T, DO, 2 tablet at 09/15/20 0753   polyethylene glycol (  MIRALAX / GLYCOLAX) packet 17 g, 17 g, Oral, BID, Ghimire, Henreitta Leber, MD, 17 g at 09/17/20 1025   [COMPLETED] predniSONE (DELTASONE) tablet 10 mg, 10 mg, Oral, QID, 10 mg at 09/16/20 2220 **FOLLOWED BY** predniSONE (DELTASONE) tablet 10 mg, 10 mg, Oral, TID, 10 mg at 09/17/20 0809 **FOLLOWED BY** [START ON 09/18/2020] predniSONE (DELTASONE) tablet 10 mg, 10 mg, Oral, BID WC **FOLLOWED BY** [START ON 09/19/2020] predniSONE (DELTASONE) tablet 10 mg, 10 mg, Oral, Q breakfast, Ashok Pall, MD   Derrill Memo ON 09/20/2020] predniSONE (DELTASONE) tablet 5 mg, 5 mg, Oral, Q breakfast, Cabbell, Kyle, MD   senna-docusate (Senokot-S) tablet 1 tablet, 1 tablet, Oral, QHS PRN, Ashok Pall, MD   sodium chloride flush (NS) 0.9 % injection 3 mL, 3 mL, Intravenous, Q12H, Cabbell, Kyle, MD, 3 mL at 09/17/20 1033   sodium chloride flush (NS) 0.9 % injection 3 mL, 3 mL, Intravenous, PRN, Ashok Pall, MD   sodium phosphate (FLEET) 7-19 GM/118ML enema 1 enema, 1 enema, Rectal, Once PRN, Ashok Pall, MD   Vitamin E CAPS 400 Units, 400 Units, Oral, Daily, Ashok Pall, MD, 400 Units at 09/17/20 1026  Patients Current Diet:  Diet Order             Diet - low sodium heart healthy           Diet regular Room service appropriate? Yes; Fluid consistency:  Thin  Diet effective now                   Precautions / Restrictions Precautions Precautions: Fall Precaution Comments: RLE fractures identified Restrictions Weight Bearing Restrictions: No RLE Weight Bearing: Non weight bearing   Has the patient had 2 or more falls or a fall with injury in the past year? Yes  Prior Activity Level Limited Community (1-2x/wk): Pt. went out a few times a week  Prior Functional Level Self Care: Did the patient need help bathing, dressing, using the toilet or eating? Independent  Indoor Mobility: Did the patient need assistance with walking from room to room (with or without device)? Independent  Stairs: Did the patient need assistance with internal or external stairs (with or without device)? Dependent  Functional Cognition: Did the patient need help planning regular tasks such as shopping or remembering to take medications? Needed some help  Patient Information Are you of Hispanic, Latino/a,or Spanish origin?: A. No, not of Hispanic, Latino/a, or Spanish origin What is your race?: A. White Do you need or want an interpreter to communicate with a doctor or health care staff?: 0. No  Patient's Response To:  Health Literacy and Transportation Is the patient able to respond to health literacy and transportation needs?: Yes Health Literacy - How often do you need to have someone help you when you read instructions, pamphlets, or other written material from your doctor or pharmacy?: Never In the past 12 months, has lack of transportation kept you from medical appointments or from getting medications?: No In the past 12 months, has lack of transportation kept you from meetings, work, or from getting things needed for daily living?: No  Development worker, international aid / [place] Devices/Equipment: Wellsite geologist, Radio producer (specify quad or straight), Environmental consultant (specify type) Home Equipment: Environmental consultant - 2 wheels, Bedside commode, Shower seat, Grab bars -  toilet, Grab bars - tub/shower, Wheelchair - manual  Prior Device Use: Indicate devices/aids used by the patient prior to current illness, exacerbation or injury? Walker  Current Functional Level Cognition  Overall Cognitive Status: Within  Functional Limits for tasks assessed Orientation Level: Oriented X4 General Comments: very fearful of sliding out of bed, or falling.    Extremity Assessment (includes Sensation/Coordination)  Upper Extremity Assessment: Generalized weakness  Lower Extremity Assessment: Defer to PT evaluation RLE Deficits / Details: In Knee immobilizer, can pump ankles RLE: Unable to fully assess due to immobilization LLE Deficits / Details: AROM WNL, able to perform heelslide, SLR with increased pain shooting down RLE, knee flexion ~25%, hip abd ~50%, reports foot "numb and heavy"    ADLs  Overall ADL's : Needs assistance/impaired Eating/Feeding: Set up, Sitting Grooming: Wash/dry hands, Wash/dry face, Set up, Sitting Grooming Details (indicate cue type and reason): supported sitting. Upper Body Dressing : Minimal assistance, Sitting Lower Body Dressing: Maximal assistance, Bed level Toileting- Clothing Manipulation and Hygiene: Maximal assistance, Bed level Functional mobility during ADLs: Maximal assistance, +2 for safety/equipment, +2 for physical assistance    Mobility  Overal bed mobility: Needs Assistance Bed Mobility: Supine to Sit, Sit to Supine Rolling: Max assist Sidelying to sit: Total assist Sit to supine: Max assist General bed mobility comments: patient has very little ability to move the R leg with KI, and is quite weak with L leg.    Transfers  Overall transfer level: Needs assistance Transfers: Lateral/Scoot Transfers  Lateral/Scoot Transfers: Max assist, +2 physical assistance, From elevated surface General transfer comment: no +2 so just sat EOB.    Ambulation / Gait / Stairs / Wheelchair Mobility  Ambulation/Gait General Gait  Details: Unable    Posture / Balance Dynamic Sitting Balance Sitting balance - Comments: relies on UE support and external support for balance- mod to max assist as she fatigued Balance Overall balance assessment: Needs assistance Sitting-balance support: Bilateral upper extremity supported, Feet supported Sitting balance-Leahy Scale: Poor Sitting balance - Comments: relies on UE support and external support for balance- mod to max assist as she fatigued Postural control: Posterior lean Standing balance comment: NT    Special needs/care consideration Skin surgical incision , Knee Immobilizer, NWB on RLE   Previous Home Environment (from acute therapy documentation) Living Arrangements: Spouse/significant other  Lives With: Spouse Available Help at Discharge: Family, Available PRN/intermittently Type of Home: House Home Layout: Two level Alternate Level Stairs-Number of Steps: has stair lift from husband with ALS Home Access: Ramped entrance Bathroom Shower/Tub: Multimedia programmer: Handicapped height Bathroom Accessibility: Yes How Accessible: Accessible via walker Home Care Services: No Additional Comments: pt's spouse has ALS with caregivers, pt able to use his w/c, has stair lift, home is w/c accesible  Discharge Living Setting Plans for Discharge Living Setting: Patient's home Type of Home at Discharge: House Discharge Home Layout: Multi-level, Able to live on main level with bedroom/bathroom Alternate Level Stairs-Rails: Right Alternate Level Stairs-Number of Steps: has lift to 2nd floor Discharge Home Access: Stairs to enter, Other (comment) (has lfit) Entrance Stairs-Rails: Right Entrance Stairs-Number of Steps: has lift to enter Discharge Bathroom Shower/Tub: Tub/shower unit Discharge Bathroom Toilet: Handicapped height Discharge Bathroom Accessibility: Yes How Accessible: Accessible via walker, Accessible via wheelchair Does the patient have any problems  obtaining your medications?: No  Social/Family/Support Systems Patient Roles: Spouse, Other (Comment) Contact Information: [phone] Anticipated Caregiver: Ysidro Evert (son) Anticipated Caregiver's Contact Information: [phone] Ability/Limitations of Caregiver: Can provide Mod A Caregiver Availability: 24/7 Discharge Plan Discussed with Primary Caregiver: Yes Is Caregiver In Agreement with Plan?: Yes Does Caregiver/Family have Issues with Lodging/Transportation while Pt is in Rehab?: Yes  Goals Patient/Family Goal for Rehab: PT/OT Mod  A Expected length of stay: 18-21 days Pt/Family Agrees to Admission and willing to participate: Yes Program Orientation Provided & Reviewed with Pt/Caregiver Including Roles  & Responsibilities: Yes  Decrease burden of Care through IP rehab admission: Specialzed equipment needs, Decrease number of caregivers, Bowel and bladder program, and Patient/family education  Possible need for SNF placement upon discharge: not anticipated   Patient Condition: I have reviewed medical records from [facility], spoken with CM, and patient. I met with patient at the bedside and discussed via phone for inpatient rehabilitation assessment.  Patient will benefit from ongoing PT and OT, can actively participate in 3 hours of therapy a day 5 days of the week, and can make measurable gains during the admission.  Patient will also benefit from the coordinated team approach during an Inpatient Acute Rehabilitation admission.  The patient will receive intensive therapy as well as Rehabilitation physician, nursing, social worker, and care management interventions.  Due to safety, skin/wound care, disease management, medication administration, pain management, and patient education the patient requires 24 hour a day rehabilitation nursing.  The patient is currently max-total A with mobility and basic ADLs.  Discharge setting and therapy post discharge at home with home  health is anticipated.  Patient has agreed to participate in the Acute Inpatient Rehabilitation Program and will admit today.  Preadmission Screen Completed By:  Genella Mech, 09/17/2020 1:01 PM ______________________________________________________________________   Discussed status with Dr. Naaman Plummer on 09/17/20 at 70 and received approval for admission today.  Admission Coordinator:  Genella Mech, CCC-SLP, time 930/Date 09/2220   Assessment/Plan: Diagnosis: lumbar stenosis s/p PLIF, recent right femur and calcaneus fx's  after fall Does the need for close, 24 hr/day Medical supervision in concert with the patient's rehab needs make it unreasonable for this patient to be served in a less intensive setting? Yes Co-Morbidities requiring supervision/potential complications: RA, anxiety, fms, anemia Due to bladder management, bowel management, safety, skin/wound care, disease management, medication administration, pain management, and patient education, does the patient require 24 hr/day rehab nursing? Yes Does the patient require coordinated care of a physician, rehab nurse, PT, OT, and SLP to address physical and functional deficits in the context of the above medical diagnosis(es)? Yes Addressing deficits in the following areas: balance, endurance, locomotion, strength, transferring, bowel/bladder control, bathing, dressing, feeding, grooming, toileting, and psychosocial support Can the patient actively participate in an intensive therapy program of at least 3 hrs of therapy 5 days a week? Yes The potential for patient to make measurable gains while on inpatient rehab is excellent Anticipated functional outcomes upon discharge from inpatient rehab: min assist and mod assist PT, min assist and mod assist OT, n/a SLP Estimated rehab length of stay to reach the above functional goals is: 18-21 days Anticipated discharge destination: Home 10. Overall Rehab/Functional Prognosis: excellent   MD  Signature: Meredith Staggers, MD, [place] [facility] 09/17/2020

## 2020-09-17 NOTE — Progress Notes (Signed)
Patient asleep and resting well following scheduled pain medications. Pain well controlled at this time. RN will discontinue foley catheter in the A.M.

## 2020-09-17 NOTE — H&P (Signed)
Inpatient Rehab Admissions Coordinator:   I have a CIR bed for this Pt. Today.RN may call report [phone].   Clemens Catholic, [place], [place] Admissions Coordinator  [phone] ([place]) [phone] (office)

## 2020-09-17 NOTE — Plan of Care (Signed)

## 2020-09-17 NOTE — Progress Notes (Signed)
Inpatient Rehabilitation Medication Review by a Pharmacist  A complete drug regimen review was completed for this patient to identify any potential clinically significant medication issues.  High Risk Drug Classes Is patient taking? Indication by Medication  Antipsychotic Yes Compazine for nausea  Anticoagulant Yes Lovenox for DVT ppx  Antibiotic No   Opioid Yes Oxycontin, Oxycodone for pain  Antiplatelet No   Hypoglycemics/insulin No   Vasoactive Medication Yes Losartan / HCTZ for blood pressure  Chemotherapy No   Other No      Type of Medication Issue Identified Description of Issue Recommendation(s)  Drug Interaction(s) (clinically significant)     Duplicate Therapy     Allergy     No Medication Administration End Date     Incorrect Dose     Additional Drug Therapy Needed     Significant med changes from prior encounter (inform family/care partners about these prior to discharge).    Other       Clinically significant medication issues were identified that warrant physician communication and completion of prescribed/recommended actions by midnight of the next day:  No  Pharmacist comments: No other issues identified  Time spent performing this drug regimen review (minutes):  15 minutes   Tad Moore 09/17/2020 3:50 PM

## 2020-09-18 ENCOUNTER — Inpatient Hospital Stay (HOSPITAL_COMMUNITY): Payer: Medicare Other

## 2020-09-18 DIAGNOSIS — Z9889 Other specified postprocedural states: Secondary | ICD-10-CM

## 2020-09-18 LAB — CBC WITH DIFFERENTIAL/PLATELET
Abs Immature Granulocytes: 0.1 10*3/uL — ABNORMAL HIGH (ref 0.00–0.07)
Basophils Absolute: 0 10*3/uL (ref 0.0–0.1)
Basophils Relative: 0 %
Eosinophils Absolute: 0.1 10*3/uL (ref 0.0–0.5)
Eosinophils Relative: 1 %
HCT: 31 % — ABNORMAL LOW (ref 36.0–46.0)
Hemoglobin: 9.9 g/dL — ABNORMAL LOW (ref 12.0–15.0)
Immature Granulocytes: 1 %
Lymphocytes Relative: 16 %
Lymphs Abs: 1.7 10*3/uL (ref 0.7–4.0)
MCH: 29.3 pg (ref 26.0–34.0)
MCHC: 31.9 g/dL (ref 30.0–36.0)
MCV: 91.7 fL (ref 80.0–100.0)
Monocytes Absolute: 1.3 10*3/uL — ABNORMAL HIGH (ref 0.1–1.0)
Monocytes Relative: 13 %
Neutro Abs: 7 10*3/uL (ref 1.7–7.7)
Neutrophils Relative %: 69 %
Platelets: 314 10*3/uL (ref 150–400)
RBC: 3.38 MIL/uL — ABNORMAL LOW (ref 3.87–5.11)
RDW: 14.4 % (ref 11.5–15.5)
WBC: 10.2 10*3/uL (ref 4.0–10.5)
nRBC: 0 % (ref 0.0–0.2)

## 2020-09-18 LAB — COMPREHENSIVE METABOLIC PANEL
ALT: 18 U/L (ref 0–44)
AST: 21 U/L (ref 15–41)
Albumin: 2.9 g/dL — ABNORMAL LOW (ref 3.5–5.0)
Alkaline Phosphatase: 67 U/L (ref 38–126)
Anion gap: 10 (ref 5–15)
BUN: 18 mg/dL (ref 8–23)
CO2: 31 mmol/L (ref 22–32)
Calcium: 9.3 mg/dL (ref 8.9–10.3)
Chloride: 95 mmol/L — ABNORMAL LOW (ref 98–111)
Creatinine, Ser: 0.62 mg/dL (ref 0.44–1.00)
GFR, Estimated: >60 mL/min (ref >60)
Glucose, Bld: 96 mg/dL (ref 70–99)
Potassium: 3.9 mmol/L (ref 3.5–5.1)
Sodium: 136 mmol/L (ref 135–145)
Total Bilirubin: 0.2 mg/dL — ABNORMAL LOW (ref 0.3–1.2)
Total Protein: 6.4 g/dL — ABNORMAL LOW (ref 6.5–8.1)

## 2020-09-18 LAB — PROCALCITONIN: Procalcitonin: 0.11 ng/mL

## 2020-09-18 NOTE — Evaluation (Signed)
Physical Therapy Assessment and Plan  Patient Details  Name: Haley Roberts MRN: [id number] Date of Birth: 1945/03/24  PT Diagnosis: Abnormal posture, Edema, Impaired sensation, Muscle spasms, Muscle weakness, Pain in R LE and back, and Post laminectomy syndrom Rehab Potential: Fair ELOS: ~3.5 weeks   Today's Date: 09/18/2020 PT Individual Time: 1530-1650 PT Individual Time Calculation (min): 80 min    Hospital Problem: Principal Problem:   Status post lumbar surgery   Past Medical History:  Past Medical History:  Diagnosis Date   Anemia    Anxiety    Depression    Fibromyalgia    Hyperglycemia    HYPERGLYCEMIA, FASTING 08/22/2007   Hypertension    Multiple thyroid nodules    gets ultra sound yearly on thryoid   Rheumatoid arthritis(714.0)    Past Surgical History:  Past Surgical History:  Procedure Laterality Date   ABDOMINAL HYSTERECTOMY     with BSO   BREAST BIOPSY     JOINT REPLACEMENT     right knee replacement 2012   TOTAL KNEE ARTHROPLASTY Left 07/07/2014   Procedure: LEFT TOTAL KNEE ARTHROPLASTY;  Surgeon: Paralee Cancel, MD;  Location: [facility];  Service: Orthopedics;  Laterality: Left;    Assessment & Plan Clinical Impression: Patient is a 75 y.o. year old female with history of RA-chronic steroids (Dr. Amil Amen), chronic LBP-on opioids, recent fall with nondisplaced periprosthetic fracture right medial femoral condyle and avulsion fracture right anterior calcaneus, progressive low back pain who was admitted on 09/09/2020 with pain radiating to bilateral buttocks with numbness and tingling.  MRI lumbar spine done revealing advanced facet arthropathy with degenerative anterolisthesis L4/5 with severe multifactorial spinal stenosis and question neural compression of central canal, facet arthropathy with degenerative anterolisthesis L3/4 and L5/S1.   Dr. Lannette Donath was consulted for input on lower extremity fractures and recommended knee immobilizer on right knee with  NWB RLE and prednisone increased to 60 mg with taper for pain control. Patient initially decline surgery but continued to have limitations with mobility and pain with standing attempts.  Dr. Christella Noa consulted and patient elected to undergo L3/4 and L4/L5 decompressive lam on 08/31. Post op continues to be limited by NWB RLE, pain LB with numbness/tingling, balance deficits with anxiety as well as arthritic changes bilateral hands. CIR recommended due to functional decline. Patient transferred to CIR on 09/17/2020 .   Patient currently requires  +2 total assist  with mobility secondary to muscle weakness and muscle joint tightness, decreased cardiorespiratoy endurance, impaired timing and sequencing, unbalanced muscle activation, and decreased motor planning,  , and decreased sitting balance, decreased standing balance, decreased postural control, decreased balance strategies, and difficulty maintaining precautions.  Prior to hospitalization, patient was independent  with mobility and lived with Spouse in a House home.  Home access is  Ramped entrance.  Patient will benefit from skilled PT intervention to maximize safe functional mobility, minimize fall risk, and decrease caregiver burden for planned discharge home with 24 hour assist.  Anticipate patient will benefit from follow up [facility] at discharge.  PT - End of Session Activity Tolerance: Tolerates 30+ min activity with multiple rests Endurance Deficit: Yes Endurance Deficit Description: requires frequent rest breaks due to elevated pain PT Assessment Rehab Potential (ACUTE/IP ONLY): Fair PT Barriers to Discharge: Decreased caregiver support;Lack of/limited family support;Weight bearing restrictions PT Patient demonstrates impairments in the following area(s): Balance;Perception;Behavior;Safety;Edema;Sensory;Endurance;Skin Integrity;Motor;Nutrition;Pain PT Transfers Functional Problem(s): Bed Mobility;Bed to Chair;Car;Furniture PT Locomotion Functional  Problem(s): Ambulation;Wheelchair Mobility;Stairs PT Plan PT Intensity: Minimum of 1-2  x/day ,45 to 90 minutes PT Frequency: 5 out of 7 days PT Duration Estimated Length of Stay: ~3.5 weeks PT Treatment/Interventions: Ambulation/gait training;Community reintegration;DME/adaptive equipment instruction;Neuromuscular re-education;Psychosocial support;UE/LE Strength taining/ROM;Wheelchair propulsion/positioning;Balance/vestibular training;Discharge planning;Functional electrical stimulation;Pain management;Skin care/wound management;Therapeutic Activities;UE/LE Coordination activities;Cognitive remediation/compensation;Disease management/prevention;Functional mobility training;Patient/family education;Splinting/orthotics;Therapeutic Exercise;Visual/perceptual remediation/compensation PT Transfers Anticipated Outcome(s): min/mod assist with LRAD PT Locomotion Anticipated Outcome(s): do not anticipate pt will be a functional ambulatory at D/C PT Recommendation Recommendations for Other Services: Therapeutic Recreation consult Therapeutic Recreation Interventions: Stress management Follow Up Recommendations: Home health PT;24 hour supervision/assistance Patient destination: Home Equipment Recommended: To be determined   PT Evaluation Precautions/Restrictions Precautions Precautions: Fall;Other (comment) Precaution Comments: Fear of falls, anxiety, back precautions (pt unable to verbalize any of 3) Required Braces or Orthoses: Knee Immobilizer - Right Knee Immobilizer - Right: On at all times Restrictions Weight Bearing Restrictions: Yes RLE Weight Bearing: Non weight bearing Pain Pain Assessment Pain Scale: 0-10 Pain Score: 8  Pain Type: Acute pain;Surgical pain Pain Location: Back Pain Orientation: Lower Pain Descriptors / Indicators: Aching Pain Frequency: Constant Pain Onset: On-going Patients Stated Pain Goal: 0 Pain Intervention(s): Medication (See  eMAR);Rest;Relaxation;Repositioned;Emotional support;Distraction Multiple Pain Sites: Yes 2nd Pain Site Pain Score: 8 Pain Type: Acute pain;Surgical pain Pain Location: Leg Pain Orientation: Right (incision) Pain Descriptors / Indicators: Throbbing Pain Onset: On-going Pain Intervention(s): Medication (See eMAR);Rest;Repositioned;Emotional support;Distraction Pain Interference Pain Interference Pain Effect on Sleep: 1. Rarely or not at all Pain Interference with Therapy Activities: 1. Rarely or not at all Pain Interference with Day-to-Day Activities: 3. Frequently Home Living/Prior Functioning Home Living Available Help at Discharge: Family;Available 24 hours/day (family planning to hire 24hr caregivers and 43y.o. son, Ysidro Evert, lives with pt's husband right now) Type of Home: House Home Access: Other (comment) (wheelchair/scooter lift) Home Layout: Two level;Able to live on main level with bedroom/bathroom;Full bath on main level Alternate Level Stairs-Number of Steps: has stair lift from husband with ALS Additional Comments: pt's spouse has ALS with caregivers, pt able to use his w/c, has stair lift, home is w/c accesible  Lives With: Spouse (husband, Nadara Mustard) Prior Function Level of Independence: Independent with basic ADLs;Independent with homemaking with ambulation;Independent with gait;Independent with transfers Driving: No (due to cataracts) Comments: Pt reports was independent and one of spouse's caregivers, but had started using a cane ~79month ago due to gradually worsening pain progressing to pt being in w/c or bed since 8/21 due to severe pain Vision/Perception  Vision - History Ability to See in Adequate Light: 1 Impaired (pt reports cataracts) Perception Perception: Within Functional Limits Praxis Praxis: Impaired Praxis Impairment Details: Initiation;Motor planning  Cognition  Overall Cognitive Status: Within Functional Limits for tasks assessed Arousal/Alertness:  Awake/alert Orientation Level: Oriented X4 Year: 2022 Month: September Day of Week: Correct Attention: Focused;Sustained Focused Attention: Appears intact Sustained Attention: Appears intact Memory: Appears intact Awareness: Appears intact Safety/Judgment: Impaired Sensation Sensation Light Touch: Impaired Detail Peripheral sensation comments: impaired in BLEs distally>proximally Light Touch Impaired Details: Impaired RLE;Impaired LLE Hot/Cold: Not tested Proprioception: Impaired Detail Proprioception Impaired Details: Impaired RLE;Impaired LLE (pt unaware of B LE ankle positioning during mobility tasks without visual feedback) Stereognosis: Not tested Coordination Gross Motor Movements are Fluid and Coordinated: No Coordination and Movement Description: RA in B UEs, impaired movements due to R LE NWB, spine precautions, and BLE weakenss Motor  Motor Motor: Abnormal postural alignment and control;Paraplegia Motor - Skilled Clinical Observations: B LE weakness   Trunk/Postural Assessment  Cervical Assessment Cervical Assessment:  (forward head) Thoracic Assessment Thoracic Assessment:  (  rounded shoulders with mild thoracic kyphosis) Lumbar Assessment Lumbar Assessment:  (back precautions) Postural Control Postural Control: Deficits on evaluation (delayed d/t pain and fear)  Balance Balance Balance Assessed: Yes Static Sitting Balance Static Sitting - Balance Support: Feet supported;Bilateral upper extremity supported Static Sitting - Level of Assistance: 3: Mod assist;2: Max assist Dynamic Sitting Balance Dynamic Sitting - Balance Support: Feet supported Dynamic Sitting - Level of Assistance: 1: +1 Total assist;2: Max assist Extremity Assessment      RLE Assessment RLE Assessment: Exceptions to [facility] Passive Range of Motion (PROM) Comments: limited ankle DF ROM, limited hip abduction due to pain General Strength Comments: unable to assess knee flexion/extension  strength due to knee immobilizer RLE Strength Right Hip Flexion: 2-/5 Right Hip Extension: 2-/5 Right Hip ABduction: 2-/5 Right Hip ADduction: 2-/5 Right Ankle Dorsiflexion: 2/5 Right Ankle Plantar Flexion: 2+/5 LLE Assessment LLE Assessment: Exceptions to [facility] Passive Range of Motion (PROM) Comments: decreased hip/knee flexion ROM in supine due to pain LLE Strength Left Hip Flexion: 2-/5 Left Hip Extension: 2-/5 Left Hip ABduction: 2-/5 Left Hip ADduction: 2-/5 Left Knee Flexion: 2/5 Left Knee Extension: 2/5 Left Ankle Dorsiflexion: 2/5 Left Ankle Plantar Flexion: 2/5  Care Tool Care Tool Bed Mobility Roll left and right activity   Roll left and right assist level: Total Assistance - Patient < 25% Roll left and right assistive device comment: bedrail  Sit to lying activity   Sit to lying assist level: 2 Helpers    Lying to sitting on side of bed activity   Lying to sitting on side of bed assist level: the ability to move from lying on the back to sitting on the side of the bed with no back support.: 2 Helpers     Care Tool Transfers Sit to stand transfer Sit to stand activity did not occur: Safety/medical concerns      Chair/bed transfer Chair/bed transfer activity did not occur: Safety/medical concerns       Toilet transfer Toilet transfer activity did not occur: Safety/medical concerns     Scientist, product/process development transfer activity did not occur: Safety/medical concerns        Care Tool Locomotion Ambulation Ambulation activity did not occur: Safety/medical concerns        Walk 10 feet activity Walk 10 feet activity did not occur: Safety/medical concerns       Walk 50 feet with 2 turns activity Walk 50 feet with 2 turns activity did not occur: Safety/medical concerns      Walk 150 feet activity Walk 150 feet activity did not occur: Safety/medical concerns      Walk 10 feet on uneven surfaces activity Walk 10 feet on uneven surfaces activity did not occur:  Safety/medical concerns      Stairs Stair activity did not occur: Safety/medical concerns        Walk up/down 1 step activity Walk up/down 1 step or curb (drop down) activity did not occur: Safety/medical concerns     Walk up/down 4 steps activity did not occuR: Safety/medical concerns  Walk up/down 4 steps activity      Walk up/down 12 steps activity Walk up/down 12 steps activity did not occur: Safety/medical concerns      Pick up small objects from floor Pick up small object from the floor (from standing position) activity did not occur: Safety/medical concerns      Wheelchair Is the patient using a wheelchair?: Yes Type of Wheelchair: Manual Wheelchair activity did not occur: Safety/medical concerns  Wheel 50 feet with 2 turns activity Wheelchair 50 feet with 2 turns activity did not occur: Safety/medical concerns    Wheel 150 feet activity Wheelchair 150 feet activity did not occur: Safety/medical concerns      Refer to Care Plan for Long Term Goals  SHORT TERM GOAL WEEK 1 PT Short Term Goal 1 (Week 1): Pt will perform supine<>sit with +2 max assist PT Short Term Goal 2 (Week 1): Pt will perform bed<>chair transfers using LRAD with +2 max assist PT Short Term Goal 3 (Week 1): Pt will tolerate upright, OOB sitting for at least 1 hour between therapy sessions PT Short Term Goal 4 (Week 1): Pt will recall 2/3 spine precautions without cuing PT Short Term Goal 5 (Week 1): Pt will propel wheelchair 34f with min assist  Recommendations for other services: Therapeutic Recreation  Stress management  Skilled Therapeutic Intervention Pt received supine in bed reporting 8/10 pain in R LE and back, but despite this agreeable to therapy session. Evaluation completed (see details above) with patient education regarding purpose of PT evaluation, PT POC and goals, therapy schedule, weekly team meetings, and other CIR information including safety plan and fall risk safety. Pt  wearing R LE knee immobilizer throughout session - opened to perform sensation testing. Pt completed the below mobility tasks with levels of assist as specified. Completed supine>sitting L EOB via logroll technique to maintain spine precautions and R LE NWBing - requires heavy +2 total assist for R LE management and trunk upright along with scooting hips towards EOB - pt with consistent posterior lean/LOB. Pt with increased pain with mobility requiring cuing for relaxation and breathing techniques along with emotional support and distraction for pain management. Sitting EOB pt continues to demonstrate posterior trunk lean relying heavily on B UE support with CGA progressed to requiring mod/max assist for static sitting with fatigue. Pt deferring attempts at transferring at this time due to elevated pain level. Performed the below B LE exercises. At end of session, pt left supine in bed with needs in reach and bed alarm on.  Performed the following supine exercises x10reps each: - L LE heel slides and pt self-assisting with movement using L UE due to hip flexor weakness - L LE hip abduction/adduction active assist  - glute sets with L LE flexed into hooklying for increased activation - L LE ankle pumps  - R LE ankle pumps - gentle R LE ankle DF stretch x1 minute  - R LE hip abduction/adduction active assist Therapist cuing throughout for proper form/technique. Educated pt on importance of maintaining R hip and ankle ROM and strength while in knee immobilizer.   Mobility Bed Mobility Bed Mobility: Rolling Left;Sit to Supine;Supine to Sit Rolling Left: Total Assistance - Patient < 25% Supine to Sit: 2 Helpers Sit to Supine: 2 Helpers Locomotion  Gait Ambulation: No Gait Gait: No Stairs / Additional Locomotion Stairs: No Wheelchair Mobility Wheelchair Mobility: No   Discharge Criteria: Patient will be discharged from PT if patient refuses treatment 3 consecutive times without medical reason,  if treatment goals not met, if there is a change in medical status, if patient makes no progress towards goals or if patient is discharged from hospital.  The above assessment, treatment plan, treatment alternatives and goals were discussed and mutually agreed upon: by patient  CTawana Scale, PT, DPT, NCS, CSRS 09/18/2020, 3:11 PM

## 2020-09-18 NOTE — Evaluation (Signed)
Occupational Therapy Assessment and Plan  Patient Details  Name: Haley Roberts MRN: [id number] Date of Birth: 1945/11/21  OT Diagnosis: abnormal posture, acute pain, lumbago (low back pain), muscle weakness (generalized), and paraparesis  Rehab Potential: Rehab Potential (ACUTE ONLY): Fair ELOS: 3 weeks   Today's Date: 09/18/2020 OT Individual Time: 4098-1191 OT Individual Time Calculation (min): 130 min     Hospital Problem: Principal Problem:   Status post lumbar surgery   Past Medical History:  Past Medical History:  Diagnosis Date   Anemia    Anxiety    Depression    Fibromyalgia    Hyperglycemia    HYPERGLYCEMIA, FASTING 08/22/2007   Hypertension    Multiple thyroid nodules    gets ultra sound yearly on thryoid   Rheumatoid arthritis(714.0)    Past Surgical History:  Past Surgical History:  Procedure Laterality Date   ABDOMINAL HYSTERECTOMY     with BSO   BREAST BIOPSY     JOINT REPLACEMENT     right knee replacement 2012   TOTAL KNEE ARTHROPLASTY Left 07/07/2014   Procedure: LEFT TOTAL KNEE ARTHROPLASTY;  Surgeon: Paralee Cancel, MD;  Location: [facility];  Service: Orthopedics;  Laterality: Left;    Assessment & Plan Clinical Impression: Haley Roberts is a 75 y.o. female who presents with concerns of worsening lower back pain. Patient reports that for the past several months she has had gradual worsening of her lower back pain. She also recently was evaluated in the ED on 8/21 for a fall and was found to have nondisplaced Peri-prosthetic fx of the right medial femoral condyle and right foot avulsion fracture of the anterior calcaneus- now with KI and NWB RLE; follow-up with [facility]. MRI of the lumbar spine showing facet arthropathy and degenerative anterior listhesis of multiple levels.  Pt underwent L3-L5 PLIF on 8/31.  PMH: chronic back pain on chronic opioid, RA, fibromyalgia, and HTN   Patient currently requires max with basic self-care skills secondary to  muscle weakness, decreased cardiorespiratoy endurance, abnormal tone, unbalanced muscle activation, and decreased coordination, decreased problem solving and decreased safety awareness, and decreased sitting balance, decreased standing balance, decreased postural control, and decreased balance strategies.  Prior to hospitalization, patient could complete BADL/IADL with independent .  Patient will benefit from skilled intervention to decrease level of assist with basic self-care skills and increase independence with basic self-care skills prior to discharge home with care partner.  Anticipate patient will require 24 hour supervision and minimal physical assistance and follow up home health.  OT - End of Session Endurance Deficit: Yes OT Assessment Rehab Potential (ACUTE ONLY): Fair OT Barriers to Discharge: Decreased caregiver support;Weight bearing restrictions;Weight OT Patient demonstrates impairments in the following area(s): Balance;Cognition;Motor;Endurance;Pain;Safety;Skin Integrity OT Basic ADL's Functional Problem(s): Grooming;Bathing;Dressing;Toileting OT Transfers Functional Problem(s): Toilet;Tub/Shower OT Additional Impairment(s): None OT Plan OT Intensity: Minimum of 1-2 x/day, 45 to 90 minutes OT Frequency: 5 out of 7 days OT Duration/Estimated Length of Stay: 3 weeks OT Treatment/Interventions: Balance/vestibular training;DME/adaptive equipment instruction;Patient/family education;Therapeutic Activities;Wheelchair propulsion/positioning;Therapeutic Exercise;Psychosocial support;Cognitive remediation/compensation;Community reintegration;Functional mobility training;Self Care/advanced ADL retraining;UE/LE Strength taining/ROM;Discharge planning;Neuromuscular re-education;Skin care/wound managment;UE/LE Coordination activities;Splinting/orthotics;Pain management;Disease mangement/prevention;Visual/perceptual remediation/compensation OT Self Feeding Anticipated Outcome(s): no goal OT  Basic Self-Care Anticipated Outcome(s): S OT Toileting Anticipated Outcome(s): S OT Bathroom Transfers Anticipated Outcome(s): S OT Recommendation Patient destination: Home Follow Up Recommendations: Home health OT Equipment Recommended: None recommended by OT   OT Evaluation Precautions/Restrictions  Precautions Precautions: Fall Precaution Comments: Fear of falls, anxiety, back precautions (pt unable to  verbalize any of 3) Required Braces or Orthoses: Knee Immobilizer - Right Knee Immobilizer - Right: On at all times Restrictions Weight Bearing Restrictions: Yes RLE Weight Bearing: Non weight bearing General Chart Reviewed: Yes Family/Caregiver Present: No Vital Signs   Pain   7/10- repositioned in bed Home Living/Prior [place] expects to be discharged to:: Private residence Living Arrangements: Spouse/significant other Type of Home: House Home Access: Ramped entrance Home Layout: Two level Alternate Level Stairs-Number of Steps: has stair lift from husband with ALS Bathroom Shower/Tub: Multimedia programmer: Handicapped height Bathroom Accessibility: Yes Additional Comments: pt's spouse has ALS with caregivers, pt able to use his w/c, has stair lift, home is w/c accesible  Lives With: Spouse Prior Function Level of Independence: Independent with basic ADLs, Independent with homemaking with ambulation Comments: Pt reports was independent and one of spouse's caregivers, but has been in w/c or bed since 8/21 due to severe pain Vision Baseline Vision/History: 1 Wears glasses Ability to See in Adequate Light: 0 Adequate Patient Visual Report: No change from baseline Perception  Perception: Within Functional Limits Praxis Praxis: Intact Cognition Orientation Level: Person;Place;Situation Person: Oriented Place: Oriented Situation: Oriented Year: 2022 Month: September Day of Week: Correct Memory: Appears intact Immediate  Memory Recall: Sock;Blue;Bed Memory Recall Sock: Without Cue Memory Recall Blue: Without Cue Memory Recall Bed: Without Cue Awareness: Appears intact Problem Solving: Appears intact Safety/Judgment: Impaired Sensation Sensation Light Touch: Impaired by gross assessment (B feet L>R) Coordination Gross Motor Movements are Fluid and Coordinated: No Fine Motor Movements are Fluid and Coordinated: No Coordination and Movement Description: RA in B shoudlers and RUE Finger Nose Finger Test: WNL Motor  Motor Motor: Abnormal postural alignment and control;Paraplegia  Trunk/Postural Assessment  Cervical Assessment Cervical Assessment:  (forward head) Thoracic Assessment Thoracic Assessment:  (rounded shoulders) Lumbar Assessment Lumbar Assessment:  (back precautions) Postural Control Postural Control: Deficits on evaluation (delayed d/t pain and fear)  Balance Balance Balance Assessed: Yes Static Sitting Balance Static Sitting - Level of Assistance: 4: Min assist Dynamic Sitting Balance Dynamic Sitting - Level of Assistance: 3: Mod assist Sitting balance - Comments: relies on UE support and external support for balance- modA with fatigue but initially min guard to minA. Extremity/Trunk Assessment RUE Assessment RUE Assessment: Exceptions to [facility] General Strength Comments: generalized weakness/RA in shoulders and hands LUE Assessment General Strength Comments: generalized weakness  Care Tool Care Tool Self Care Eating   Eating Assist Level: Set up assist    Oral Care    Oral Care Assist Level: Set up assist    Bathing   Body parts bathed by patient: Right arm;Left arm;Chest;Abdomen;Right upper leg;Left upper leg;Face Body parts bathed by helper: Buttocks;Right lower leg;Left lower leg;Front perineal area   Assist Level: Maximal Assistance - Patient 24 - 49%    Upper Body Dressing(including orthotics)   What is the patient wearing?: Pull over shirt   Assist Level:  Moderate Assistance - Patient 50 - 74%    Lower Body Dressing (excluding footwear)   What is the patient wearing?: Pants;Incontinence brief Assist for lower body dressing: Total Assistance - Patient < 25%    Putting on/Taking off footwear   What is the patient wearing?: Non-skid slipper socks Assist for footwear: Dependent - Patient 0%       Care Tool Toileting Toileting activity   Assist for toileting: 2 Helpers     Care Tool Bed Mobility Roll left and right activity   Roll left and right assist  level: Maximal Assistance - Patient 25 - 49%    Sit to lying activity   Sit to lying assist level: Maximal Assistance - Patient 25 - 49%    Lying to sitting on side of bed activity   Lying to sitting on side of bed assist level: the ability to move from lying on the back to sitting on the side of the bed with no back support.: Total Assistance - Patient < 25%     Care Tool Transfers Sit to stand transfer        Chair/bed transfer   Chair/bed transfer assist level: 2 Helpers     Toilet transfer   Assist Level: 2 Helpers     Care Tool Cognition  Expression of Ideas and Wants Expression of Ideas and Wants: 4. Without difficulty (complex and basic) - expresses complex messages without difficulty and with speech that is clear and easy to understand  Understanding Verbal and Non-Verbal Content Understanding Verbal and Non-Verbal Content: 4. Understands (complex and basic) - clear comprehension without cues or repetitions   Memory/Recall Ability Memory/Recall Ability : Current season;That he or she is in a hospital/hospital unit   Refer to Care Plan for [place] [facility] OT Short Term Goal 1 (Week 1): Pt will sit EOB wiht CGA for sitting balance for 5 min OT Short Term Goal 2 (Week 1): Pt will SB consistently wiht MOD A to w/c in prep for BSC transfers OT Short Term Goal 3 (Week 1): Pt will roll in bed wiht MOD A to decrease BOC for LB dressing OT Short Term  Goal 4 (Week 1): Pt will recall 3/3 back precautions wiht use of external aides as needed  Recommendations for other services: Neuropsych and Therapeutic Recreation  Stress management   Skilled Therapeutic Intervention 1:1. Pt received in bed agreeable to OT after education on OT role/purpose and longer session to combine two sessions into one. Pt is very anxious overall with all mobility d/t high pain and significant fear of falling. Pt completes LB ADLs with increased time and MAX A for rolling/ADL performance at bed level. OT acquires DAC, SB, elevating leg rest as well as roho cushion for w/c. Pt completes +2 SB transfer with VC for anterior weight shift. Pt completes grooming, UB bathing and dressing at sink. Pt demo some difficulty with UB dressing and orientation of shirt. Pt reports pain medications make her feel "not all there." Pt completes hair washing, combing and drying with blow dryer with A for rinsing onto hair washing tray. Overall Roho cushion causing posterior tilt and sliding towards EOB d/t very weak core. Pt readjusted muclitple times with MAX A. Upon return via SB back to bed with MAX+1 A, OT exchanges cushion to decrease risk of pt sliding forward to floor when OOB. Exited session with pt seated in bed, exit alarm on and call light in reach   ADL ADL Grooming: Minimal assistance Where Assessed-Grooming: Sitting at sink Upper Body Bathing: Minimal assistance Where Assessed-Upper Body Bathing: Wheelchair;Sitting at sink Lower Body Bathing: Maximal assistance Where Assessed-Lower Body Bathing: Bed level Upper Body Dressing: Minimal assistance Where Assessed-Upper Body Dressing: Wheelchair;Sitting at sink Lower Body Dressing: Maximal assistance Where Assessed-Lower Body Dressing: Bed level Toilet Transfer: Other (comment) (simulated to w/c +2 A) Toilet Transfer Method: Transfer board Mobility  Bed Mobility Bed Mobility: Rolling Right;Rolling Left;Supine to Sit Rolling  Right: Maximal Assistance - Patient 25-49% Rolling Left: Maximal Assistance - Patient 25-49% Supine to  Sit: Maximal Assistance - Patient - Patient 25-49%   Discharge Criteria: Patient will be discharged from OT if patient refuses treatment 3 consecutive times without medical reason, if treatment goals not met, if there is a change in medical status, if patient makes no progress towards goals or if patient is discharged from hospital.  The above assessment, treatment plan, treatment alternatives and goals were discussed and mutually agreed upon: by patient  Tonny Branch 09/18/2020, 12:15 PM

## 2020-09-18 NOTE — Plan of Care (Signed)
  Problem: RH Balance Goal: LTG Patient will maintain dynamic sitting balance (PT) Description: LTG:  Patient will maintain dynamic sitting balance with assistance during mobility activities (PT) Flowsheets (Taken 09/18/2020 1830) LTG: Pt will maintain dynamic sitting balance during mobility activities with:: Supervision/Verbal cueing Goal: LTG Patient will maintain dynamic standing balance (PT) Description: LTG:  Patient will maintain dynamic standing balance with assistance during mobility activities (PT) Flowsheets (Taken 09/18/2020 1830) LTG: Pt will maintain dynamic standing balance during mobility activities with:: Minimal Assistance - Patient > 75%   Problem: Sit to Stand Goal: LTG:  Patient will perform sit to stand with assistance level (PT) Description: LTG:  Patient will perform sit to stand with assistance level (PT) Flowsheets (Taken 09/18/2020 1830) LTG: PT will perform sit to stand in preparation for functional mobility with assistance level: Moderate Assistance - Patient 50 - 74%   Problem: RH Bed Mobility Goal: LTG Patient will perform bed mobility with assist (PT) Description: LTG: Patient will perform bed mobility with assistance, with/without cues (PT). Flowsheets (Taken 09/18/2020 1830) LTG: Pt will perform bed mobility with assistance level of: Minimal Assistance - Patient > 75%   Problem: RH Bed to Chair Transfers Goal: LTG Patient will perform bed/chair transfers w/assist (PT) Description: LTG: Patient will perform bed to chair transfers with assistance (PT). Flowsheets (Taken 09/18/2020 1830) LTG: Pt will perform Bed to Chair Transfers with assistance level: Minimal Assistance - Patient > 75%   Problem: RH Car Transfers Goal: LTG Patient will perform car transfers with assist (PT) Description: LTG: Patient will perform car transfers with assistance (PT). Flowsheets (Taken 09/18/2020 1830) LTG: Pt will perform car transfers with assist:: Moderate Assistance - Patient 50 -  74%   Problem: RH Wheelchair Mobility Goal: LTG Patient will propel w/c in controlled environment (PT) Description: LTG: Patient will propel wheelchair in controlled environment, # of feet with assist (PT) Flowsheets (Taken 09/18/2020 1830) LTG: Pt will propel w/c in controlled environ  assist needed:: Supervision/Verbal cueing LTG: Propel w/c distance in controlled environment: 58f Goal: LTG Patient will propel w/c in home environment (PT) Description: LTG: Patient will propel wheelchair in home environment, # of feet with assistance (PT). Flowsheets (Taken 09/18/2020 1830) LTG: Pt will propel w/c in home environ  assist needed:: Supervision/Verbal cueing LTG: Propel w/c distance in home environment: 569f

## 2020-09-18 NOTE — Progress Notes (Signed)
PROGRESS NOTE   Subjective/Complaints: C/o pain at surgical site- pain medication does help and she is due to receive soon Her husband has ALS as she would like to return home as soon as physically able  ROS: +surgical site pain   Objective:   No results found. Recent Labs    09/16/20 0438 09/18/20 0504  WBC 12.2* 10.2  HGB 11.0* 9.9*  HCT 34.4* 31.0*  PLT 363 314   Recent Labs    09/15/20 2128 09/16/20 0438 09/18/20 0504  NA 141  --  136  K 3.7  --  3.9  CL  --   --  95*  CO2  --   --  31  GLUCOSE  --   --  96  BUN  --   --  18  CREATININE  --  0.64 0.62  CALCIUM  --   --  9.3    Intake/Output Summary (Last 24 hours) at 09/18/2020 1434 Last data filed at 09/18/2020 1336 Gross per 24 hour  Intake 180 ml  Output 2075 ml  Net -1895 ml        Physical Exam: Vital Signs Blood pressure 126/68, pulse 90, temperature (!) 101.1 F (38.4 C), resp. rate 16, height '5\' 2"'$  (1.575 m), weight 73.1 kg, SpO2 97 %. Gen: no distress, normal appearing HEENT: oral mucosa pink and moist, NCAT Cardio: Reg rate Chest: normal effort, normal rate of breathing Abdominal:     General: There is distension.     Comments: Bowel sounds a little high pitched.   Musculoskeletal:     Comments: Chronic RA changes in both hands and feet to a lesser extent. Heel cords a little tight.   Skin:    General: Skin is warm.     Comments: Back incision with honeycomb dressing.   Neurological:     Mental Status: She is alert and oriented to person, place, and time.     Comments: Alert and oriented x 3. Normal insight and awareness. Intact Memory. Normal language and speech. Cranial nerve exam unremarkable. Motor 5/5 in UE and 3/5 prox LLE to distal 4-/5 d/t pain inhibition. RLE limited by KI. Sensory loss in stocking glove distribution in both legs from just above ankles. DTR's 1+  Psychiatric:        Mood and Affect: Mood normal.         Behavior: Behavior normal.    Assessment/Plan: 1. Functional deficits which require 3+ hours per day of interdisciplinary therapy in a comprehensive inpatient rehab setting. Physiatrist is providing close team supervision and 24 hour management of active medical problems listed below. Physiatrist and rehab team continue to assess barriers to discharge/monitor patient progress toward functional and medical goals  Care Tool:  Bathing    Body parts bathed by patient: Right arm, Left arm, Chest, Abdomen, Right upper leg, Left upper leg, Face   Body parts bathed by helper: Buttocks, Right lower leg, Left lower leg, Front perineal area     Bathing assist Assist Level: Maximal Assistance - Patient 24 - 49%     Upper Body Dressing/Undressing Upper body dressing   What is the patient wearing?: Pull over shirt  Upper body assist Assist Level: Moderate Assistance - Patient 50 - 74%    Lower Body Dressing/Undressing Lower body dressing      What is the patient wearing?: Pants, Incontinence brief     Lower body assist Assist for lower body dressing: Total Assistance - Patient < 25%     Toileting Toileting    Toileting assist Assist for toileting: 2 Helpers     Transfers Chair/bed transfer  Transfers assist     Chair/bed transfer assist level: 2 Helpers     Locomotion Ambulation   Ambulation assist              Walk 10 feet activity   Assist           Walk 50 feet activity   Assist           Walk 150 feet activity   Assist           Walk 10 feet on uneven surface  activity   Assist           Wheelchair     Assist               Wheelchair 50 feet with 2 turns activity    Assist            Wheelchair 150 feet activity     Assist          Blood pressure 126/68, pulse 90, temperature (!) 101.1 F (38.4 C), resp. rate 16, height '5\' 2"'$  (1.575 m), weight 73.1 kg, SpO2 97 %.  Medical Problem List and  Plan: 1.  Functional and mobility deficits secondary to lumbar stenosis with previous supracondylar right femur fx and right calcaneus fx after fall             -patient may shower             -ELOS/Goals: 18-20 days, goals min to mod assist with PT and OT 2.  Antithrombotics: -DVT/anticoagulation:  Mechanical: Sequential compression devices, below knee Bilateral lower extremities             -antiplatelet therapy: N/A 3. Pain Management: Used hydrocodone 10 mg TID PTA. On prednisone taper with Oxycontin 10 mg bid and oxycodone prn.              --Flexeril TID, Gabapentin  4. Mood: Team to provide ego support.              -antipsychotic agents: N/A 5. Neuropsych: This patient is capable of making decisions on her own behalf. 6. Skin/Wound Care: Routine pressure relief measures. Monitor incision for healing.  7. Fluids/Electrolytes/Nutrition:  Monitor I/O.  --Check lytes in am. May need potassium supplement with HCTZ on board.  8. HTN: Monitor BP TID--continue HCTZ and Cozaar 9. RA: Continue Arava daily with chronic prednisone (higher doses with taper currently).  10. H/o depression: Continue Wellbutrin and Lexapro.  11. Leukocytosis: normalized.              --T 101 at noon             -UA negative             -wounds look ok 12. Pre-diabetes: Hyperglycemia due to increase in steroids. Will monitor FBS for now.  13. Hypocalcemia: Add Calcium supplement.  14. Constipation: Will add Senna S two tabs with supper 15. Urinary retention: Has not urinated since foley came out early am/last pm? ?neurogenic bladder.             --  will check voiding with PVR checks. Work on constipation relief 16. Fever: spiked 101.1 9/3 with no clear source- UA negative, patient appears comfortable. Will order blood cultures, procalcitonin and CXR. Nasal swab positive for staphylococcus aureus on 8/30.     LOS: 1 days A FACE TO FACE EVALUATION WAS PERFORMED  Clide Deutscher Sakoya Win 09/18/2020, 2:34 PM

## 2020-09-19 DIAGNOSIS — Z9889 Other specified postprocedural states: Secondary | ICD-10-CM | POA: Diagnosis not present

## 2020-09-19 LAB — BLOOD CULTURE ID PANEL (REFLEXED) - BCID2

## 2020-09-19 LAB — CBC WITH DIFFERENTIAL/PLATELET
Abs Immature Granulocytes: 0.16 10*3/uL — ABNORMAL HIGH (ref 0.00–0.07)
Basophils Absolute: 0 10*3/uL (ref 0.0–0.1)
Basophils Relative: 0 %
Eosinophils Absolute: 0.1 10*3/uL (ref 0.0–0.5)
Eosinophils Relative: 1 %
HCT: 32 % — ABNORMAL LOW (ref 36.0–46.0)
Hemoglobin: 10.2 g/dL — ABNORMAL LOW (ref 12.0–15.0)
Immature Granulocytes: 2 %
Lymphocytes Relative: 13 %
Lymphs Abs: 1.4 10*3/uL (ref 0.7–4.0)
MCH: 29.1 pg (ref 26.0–34.0)
MCHC: 31.9 g/dL (ref 30.0–36.0)
MCV: 91.4 fL (ref 80.0–100.0)
Monocytes Absolute: 1.2 10*3/uL — ABNORMAL HIGH (ref 0.1–1.0)
Monocytes Relative: 11 %
Neutro Abs: 7.9 10*3/uL — ABNORMAL HIGH (ref 1.7–7.7)
Neutrophils Relative %: 73 %
Platelets: 355 10*3/uL (ref 150–400)
RBC: 3.5 MIL/uL — ABNORMAL LOW (ref 3.87–5.11)
RDW: 14.4 % (ref 11.5–15.5)
WBC: 10.9 10*3/uL — ABNORMAL HIGH (ref 4.0–10.5)
nRBC: 0 % (ref 0.0–0.2)

## 2020-09-19 LAB — URINE CULTURE: Culture: NO GROWTH

## 2020-09-19 MED ORDER — SORBITOL 70 % SOLN
30.0000 mL | Freq: Every day | Status: DC | PRN
  Filled 2020-09-19 (×2): qty 30

## 2020-09-19 MED ORDER — JUVEN PO PACK
1.0000 | PACK | Freq: Two times a day (BID) | ORAL | Status: DC
  Administered 2020-09-19 – 2020-10-05 (×23): 1 via ORAL
  Filled 2020-09-19 (×20): qty 1

## 2020-09-19 NOTE — Progress Notes (Signed)
PRN tylenol given at 2015. Last I&O cath at 1800. At [place], attempted to void, unable, bladder scan=281. At 0352, no void, I & O cath=450cc's. Reports feeling constipated, agreeable to take a dulcolax supp. In AM. PRN oxy IR '10mg'$ 's given at 41. Complains of back pain, radiating to right hip. Bilateral heels elevated off bed with pillows. Slept good thus far.[name] A

## 2020-09-19 NOTE — Progress Notes (Signed)
PROGRESS NOTE   Subjective/Complaints: Patient appears bright and comfortable- discussed fever and workup: CXR without pneumonia, UA normal, BC with no growth, procal .11- no abx treatment recommended. She expressed understanding Does have right back pain; stable  ROS: +surgical site pain   Objective:   DG Chest 2 View  Result Date: 09/18/2020 CLINICAL DATA:  Fever.  Post lumbar fusion 09/15/2020 EXAM: CHEST - 2 VIEW COMPARISON:  09/05/2010 FINDINGS: Upper normal heart size. Mild aortic tortuosity and atherosclerosis. Streaky retrocardiac opacities typical of atelectasis. No confluent airspace disease. No pleural effusion or pneumothorax. No pulmonary edema. Thoracic spondylosis with endplate spurring IMPRESSION: 1. Streaky retrocardiac opacities typical of atelectasis. 2. Mild aortic tortuosity and atherosclerosis. Electronically Signed   By: Keith Rake M.D.   On: 09/18/2020 20:43   Recent Labs    09/18/20 0504 09/19/20 0457  WBC 10.2 10.9*  HGB 9.9* 10.2*  HCT 31.0* 32.0*  PLT 314 355   Recent Labs    09/18/20 0504  NA 136  K 3.9  CL 95*  CO2 31  GLUCOSE 96  BUN 18  CREATININE 0.62  CALCIUM 9.3    Intake/Output Summary (Last 24 hours) at 09/19/2020 1150 Last data filed at 09/19/2020 0352 Gross per 24 hour  Intake 520 ml  Output 1050 ml  Net -530 ml        Physical Exam: Vital Signs Blood pressure (!) 146/75, pulse 81, temperature 98 F (36.7 C), resp. rate 16, height '5\' 2"'$  (1.575 m), weight 68.3 kg, SpO2 98 %. Gen: no distress, normal appearing, appears bright and smiling HEENT: oral mucosa pink and moist, NCAT Cardio: Reg rate Chest: normal effort, normal rate of breathing Abdominal:     General: There is distension.     Comments: Bowel sounds a little high pitched.   Musculoskeletal:     Comments: Chronic RA changes in both hands and feet to a lesser extent. Heel cords a little tight.   Skin:     General: Skin is warm.     Comments: Back incision with honeycomb dressing.   Neurological:     Mental Status: She is alert and oriented to person, place, and time.     Comments: Alert and oriented x 3. Normal insight and awareness. Intact Memory. Normal language and speech. Cranial nerve exam unremarkable. Motor 5/5 in UE and 3/5 prox LLE to distal 4-/5 d/t pain inhibition. RLE limited by KI. Sensory loss in stocking glove distribution in both legs from just above ankles. DTR's 1+  Psychiatric:        Mood and Affect: Mood normal.        Behavior: Behavior normal.    Assessment/Plan: 1. Functional deficits which require 3+ hours per day of interdisciplinary therapy in a comprehensive inpatient rehab setting. Physiatrist is providing close team supervision and 24 hour management of active medical problems listed below. Physiatrist and rehab team continue to assess barriers to discharge/monitor patient progress toward functional and medical goals  Care Tool:  Bathing    Body parts bathed by patient: Right arm, Left arm, Chest, Abdomen, Right upper leg, Left upper leg, Face   Body parts bathed by helper: Buttocks, Right  lower leg, Left lower leg, Front perineal area     Bathing assist Assist Level: Maximal Assistance - Patient 24 - 49%     Upper Body Dressing/Undressing Upper body dressing   What is the patient wearing?: Pull over shirt    Upper body assist Assist Level: Moderate Assistance - Patient 50 - 74%    Lower Body Dressing/Undressing Lower body dressing      What is the patient wearing?: Pants, Incontinence brief     Lower body assist Assist for lower body dressing: Total Assistance - Patient < 25%     Toileting Toileting    Toileting assist Assist for toileting: 2 Helpers     Transfers Chair/bed transfer  Transfers assist  Chair/bed transfer activity did not occur: Safety/medical concerns  Chair/bed transfer assist level: 2 Helpers      Locomotion Ambulation   Ambulation assist   Ambulation activity did not occur: Safety/medical concerns          Walk 10 feet activity   Assist  Walk 10 feet activity did not occur: Safety/medical concerns        Walk 50 feet activity   Assist Walk 50 feet with 2 turns activity did not occur: Safety/medical concerns         Walk 150 feet activity   Assist Walk 150 feet activity did not occur: Safety/medical concerns         Walk 10 feet on uneven surface  activity   Assist Walk 10 feet on uneven surfaces activity did not occur: Safety/medical concerns         Wheelchair     Assist Is the patient using a wheelchair?: Yes Type of Wheelchair: Manual Wheelchair activity did not occur: Safety/medical concerns         Wheelchair 50 feet with 2 turns activity    Assist    Wheelchair 50 feet with 2 turns activity did not occur: Safety/medical concerns       Wheelchair 150 feet activity     Assist  Wheelchair 150 feet activity did not occur: Safety/medical concerns       Blood pressure (!) 146/75, pulse 81, temperature 98 F (36.7 C), resp. rate 16, height '5\' 2"'$  (1.575 m), weight 68.3 kg, SpO2 98 %.  Medical Problem List and Plan: 1.  Functional and mobility deficits secondary to lumbar stenosis with previous supracondylar right femur fx and right calcaneus fx after fall             -patient may shower             -ELOS/Goals: 18-20 days, goals min to mod assist with PT and OT  Continue CIR 2.  Antithrombotics: -DVT/anticoagulation:  Mechanical: Sequential compression devices, below knee Bilateral lower extremities             -antiplatelet therapy: N/A 3. Pain Management: Used hydrocodone 10 mg TID PTA. On prednisone taper with Oxycontin 10 mg bid and oxycodone prn.              --Flexeril TID, Gabapentin  4. Mood: Team to provide ego support.              -antipsychotic agents: N/A 5. Neuropsych: This patient is capable of  making decisions on her own behalf. 6. Skin/Wound Care: Routine pressure relief measures. Monitor incision for healing.  7. Fluids/Electrolytes/Nutrition:  Monitor I/O.  --Check lytes in am. May need potassium supplement with HCTZ on board.  8. HTN: Monitor BP TID--continue HCTZ and  Cozaar 9. RA: Continue Arava daily with chronic prednisone (higher doses with taper currently).  10. H/o depression: Continue Wellbutrin and Lexapro.  11. Leukocytosis: up again sightly on 9/4- fever of 100.8 over night: discussed with patient that UA and CXR show no infection, blood cultures with no growth, procal .11- no abx treatment recommended.              -wounds look ok 12. Pre-diabetes: Hyperglycemia due to increase in steroids. Will monitor FBS for now.  13. Hypocalcemia: Add Calcium supplement.  14. Constipation: Will add Senna S two tabs with supper. Add Sorbitol  15. Urinary retention: Has not urinated since foley came out early am/last pm? ?neurogenic bladder.             --will check voiding with PVR checks. Work on constipation relief 16. Low albumin: add Juven to promote wound healing    LOS: 2 days A FACE TO FACE EVALUATION WAS PERFORMED  Martha Clan P Floyd Wade 09/19/2020, 11:50 AM

## 2020-09-19 NOTE — Progress Notes (Signed)
PHARMACY - PHYSICIAN COMMUNICATION CRITICAL VALUE ALERT - BLOOD CULTURE IDENTIFICATION (BCID)  Haley Roberts is an 75 y.o. female who presented to [facility] on [facility]/17/2020 with a chief complaint of right hip/ankle fracture and low back pain with radiculopathy s/ decompression, now presenting with fevers.  CXR without pneumonia, UA normal, Bcx with no growth.   Assessment: 1 out of 3 GPC w/ staph species (no resistance) on BCID report.  Name of physician (or Provider) Contacted: Dr. Izora Ribas  Current antibiotics: None  Changes to prescribed antibiotics recommended: No changes at this time.  Will hold off on starting antibiotics for now.  Continue to monitor patient's clinical status and adjust plan as necessary.  Results for orders placed or performed during the hospital encounter of 09/17/20  Blood Culture ID Panel (Reflexed) (Collected: 09/18/2020  3:10 PM)  Result Value Ref Range   Enterococcus faecalis NOT DETECTED NOT DETECTED   Enterococcus Faecium NOT DETECTED NOT DETECTED   Listeria monocytogenes NOT DETECTED NOT DETECTED   Staphylococcus species DETECTED (A) NOT DETECTED   Staphylococcus aureus (BCID) NOT DETECTED NOT DETECTED   Staphylococcus epidermidis NOT DETECTED NOT DETECTED   Staphylococcus lugdunensis NOT DETECTED NOT DETECTED   Streptococcus species NOT DETECTED NOT DETECTED   Streptococcus agalactiae NOT DETECTED NOT DETECTED   Streptococcus pneumoniae NOT DETECTED NOT DETECTED   Streptococcus pyogenes NOT DETECTED NOT DETECTED   A.calcoaceticus-baumannii NOT DETECTED NOT DETECTED   Bacteroides fragilis NOT DETECTED NOT DETECTED   Enterobacterales NOT DETECTED NOT DETECTED   Enterobacter cloacae complex NOT DETECTED NOT DETECTED   Escherichia coli NOT DETECTED NOT DETECTED   Klebsiella aerogenes NOT DETECTED NOT DETECTED   Klebsiella oxytoca NOT DETECTED NOT DETECTED   Klebsiella pneumoniae NOT DETECTED NOT DETECTED   Proteus species NOT DETECTED NOT  DETECTED   Salmonella species NOT DETECTED NOT DETECTED   Serratia marcescens NOT DETECTED NOT DETECTED   Haemophilus influenzae NOT DETECTED NOT DETECTED   Neisseria meningitidis NOT DETECTED NOT DETECTED   Pseudomonas aeruginosa NOT DETECTED NOT DETECTED   Stenotrophomonas maltophilia NOT DETECTED NOT DETECTED   Candida albicans NOT DETECTED NOT DETECTED   Candida auris NOT DETECTED NOT DETECTED   Candida glabrata NOT DETECTED NOT DETECTED   Candida krusei NOT DETECTED NOT DETECTED   Candida parapsilosis NOT DETECTED NOT DETECTED   Candida tropicalis NOT DETECTED NOT DETECTED   Cryptococcus neoformans/gattii NOT DETECTED NOT DETECTED    Vance Peper, PharmD PGY1 Pharmacy Resident Phone [phone] 09/19/2020 3:03 PM   Please check AMION for all [facility] phone numbers After 10:00 PM, call [place] [phone]

## 2020-09-20 DIAGNOSIS — Z9889 Other specified postprocedural states: Secondary | ICD-10-CM | POA: Diagnosis not present

## 2020-09-20 LAB — BASIC METABOLIC PANEL
Anion gap: 13 (ref 5–15)
BUN: 24 mg/dL — ABNORMAL HIGH (ref 8–23)
CO2: 28 mmol/L (ref 22–32)
Calcium: 9.8 mg/dL (ref 8.9–10.3)
Chloride: 89 mmol/L — ABNORMAL LOW (ref 98–111)
Creatinine, Ser: 0.58 mg/dL (ref 0.44–1.00)
GFR, Estimated: >60 mL/min (ref >60)
Glucose, Bld: 95 mg/dL (ref 70–99)
Potassium: 3.8 mmol/L (ref 3.5–5.1)
Sodium: 130 mmol/L — ABNORMAL LOW (ref 135–145)

## 2020-09-20 LAB — CULTURE, BLOOD (ROUTINE X 2): Special Requests: ADEQUATE

## 2020-09-20 LAB — CBC
HCT: 29.8 % — ABNORMAL LOW (ref 36.0–46.0)
Hemoglobin: 9.4 g/dL — ABNORMAL LOW (ref 12.0–15.0)
MCH: 28.7 pg (ref 26.0–34.0)
MCHC: 31.5 g/dL (ref 30.0–36.0)
MCV: 90.9 fL (ref 80.0–100.0)
Platelets: 413 10*3/uL — ABNORMAL HIGH (ref 150–400)
RBC: 3.28 MIL/uL — ABNORMAL LOW (ref 3.87–5.11)
RDW: 14 % (ref 11.5–15.5)
WBC: 9.7 10*3/uL (ref 4.0–10.5)
nRBC: 0 % (ref 0.0–0.2)

## 2020-09-20 MED ORDER — CEPHALEXIN 250 MG PO CAPS
500.0000 mg | ORAL_CAPSULE | Freq: Three times a day (TID) | ORAL | Status: DC
  Administered 2020-09-20 – 2020-09-22 (×6): 500 mg via ORAL
  Filled 2020-09-20 (×6): qty 2

## 2020-09-20 NOTE — Progress Notes (Signed)
Inpatient Rehabilitation Care Coordinator Assessment and Plan Patient Details  Name: Haley Roberts MRN: [id number] Date of Birth: Jul 21, 1945  Today's Date: 09/20/2020  Hospital Problems: Principal Problem:   Status post lumbar surgery  Past Medical History:  Past Medical History:  Diagnosis Date   Anemia    Anxiety    Depression    Fibromyalgia    Hyperglycemia    HYPERGLYCEMIA, FASTING 08/22/2007   Hypertension    Multiple thyroid nodules    gets ultra sound yearly on thryoid   Rheumatoid arthritis(714.0)    Past Surgical History:  Past Surgical History:  Procedure Laterality Date   ABDOMINAL HYSTERECTOMY     with BSO   BREAST BIOPSY     JOINT REPLACEMENT     right knee replacement 2012   TOTAL KNEE ARTHROPLASTY Left 07/07/2014   Procedure: LEFT TOTAL KNEE ARTHROPLASTY;  Surgeon: Paralee Cancel, MD;  Location: [facility];  Service: Orthopedics;  Laterality: Left;   Social History:  reports that she has never smoked. She has never used smokeless tobacco. She reports that she does not drink alcohol and does not use drugs.  Family / Support Systems Marital Status: Married Patient Roles: Spouse Spouse/Significant Other: Nadara Mustard (has ALS); pt was his prinary caregiver. Working on Audiological scientist aide care for him. Children: 3 sons- Gerald Stabs (livesin [place]) , Corene Cornea (lives in [place]), and Ysidro Evert lives in the home. Other Supports: None reported Anticipated Caregiver: son Ysidro Evert Ability/Limitations of Caregiver: Ysidro Evert is looking for a job is available at this time. Caregiver Availability: 24/7 Family Dynamics: Pt lives with her husband whom she was primary caregiver and their son Ysidro Evert.  Social History Preferred language: English Religion: Protestant Cultural Background: Pt worked as a Designer, jewellery: [place] - How often do you need to have someone help you when you read instructions, pamphlets, or other written material from your doctor or pharmacy?:  Never Writes: Yes Employment Status:  (never worked) Public relations account executive Issues: Denies Guardian/Conservator: N/A   Abuse/Neglect Abuse/Neglect Assessment Can Be Completed: Yes Physical Abuse: Denies Verbal Abuse: Denies Sexual Abuse: Denies Exploitation of patient/patient's resources: Denies Self-Neglect: Denies  Patient response to: Social Isolation - How often do you feel lonely or isolated from those around you?: Never (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)  Emotional Status Pt's affect, behavior and adjustment status: Pt in good spirits at time of visit Recent Psychosocial Issues: Denies Psychiatric History: Pt admits to depression in which medications are prescribed by Dr. Robina Ade. Substance Abuse History: Denies  Patient / Family Perceptions, Expectations & Goals Pt/Family understanding of illness & functional limitations: Pt and family has general understanding of care needs. Premorbid pt/family roles/activities: Independent Anticipated changes in roles/activities/participation: Assistance with ADLs/IADLs Pt/family expectations/goals: Pt goal is "being able to walk out of here."  [employer]: None Premorbid Home Care/DME Agencies: None Transportation available at discharge: Son Is the patient able to respond to transportation needs?: Yes In the past 12 months, has lack of transportation kept you from medical appointments or from getting medications?: No In the past 12 months, has lack of transportation kept you from meetings, work, or from getting things needed for daily living?: No Resource referrals recommended: Neuropsychology  Discharge Planning Living Arrangements: Spouse/significant other, Children Support Systems: Spouse/significant other, Children Type of Residence: Private residence Insurance Resources: [employer], Multimedia programmer (specify) Nurse, mental health and Sports coach) Financial Resources: Fish farm manager, Family Support Financial  Screen Referred: No Living Expenses: Own Money Management: Patient Does  the patient have any problems obtaining your medications?: No Home Management: Pt and son managed all homecare needs Patient/Family Preliminary Plans: Son to assume role Care Coordinator Barriers to Discharge: Decreased caregiver support, Lack of/limited family support Care Coordinator Anticipated Follow Up Needs: HH/OP Expected length of stay: 3-3.5 weeks  Clinical Impression This SW covering for SW team.   SW introduced self, explained role, and discussed discharge process. Pt is not a veteran but her husband is a English as a second language teacher and gets VA benefits. HCPOA are son's Gerald Stabs and [place]. DME: w/c, rollator, cane, chair lift, shower chair with back, and downstairs bathroom has walk in shower with grab bars. Will be living on main level. Pt aware SW will f/u with her son.   1302-SW spoke with pt son Ysidro Evert [phone]) to introduce self, explain role, and discuss discharge process. He confirms that he is here to help his mother and father. Reports he will work on putting anything that needs to be in place. He is aware that an SW team member will follow-up after team conference tomorrow.   [name] [name] 09/20/2020, 1:07 PM

## 2020-09-20 NOTE — Progress Notes (Signed)
Slept good. At 2215, No void 8 hours, I & O cath =500cc's. At 0620, no void, I & O cath=400cc's. Large mushy BM this morning, used BP. 2 Oxy IR @ 0622. Mostly complains of lower back pain, radiating to right hip & thigh. Bilateral heels elevated off bed with pillows. Knee immobilizer in place to RLE. Haley Roberts A

## 2020-09-20 NOTE — Care Management (Signed)
Inpatient Rehabilitation Center Individual Statement of Services  Patient Name:  Haley Roberts  Date:  09/20/2020  Welcome to the [facility].  Our goal is to provide you with an individualized program based on your diagnosis and situation, designed to meet your specific needs.  With this comprehensive rehabilitation program, you will be expected to participate in at least 3 hours of rehabilitation therapies Monday-Friday, with modified therapy programming on the weekends.  Your rehabilitation program will include the following services:  Physical Therapy (PT), Occupational Therapy (OT), 24 hour per day rehabilitation nursing, Therapeutic Recreaction (TR), Psychology, Neuropsychology, Care Coordinator, Rehabilitation Medicine, [place], and Other  Weekly team conferences will be held on Tuesdays to discuss your progress.  Your Inpatient Rehabilitation Care Coordinator will talk with you frequently to get your input and to update you on team discussions.  Team conferences with you and your family in attendance may also be held.  Expected length of stay: 3-3.5 weeks  Overall anticipated outcome: Minimal Assistance  Depending on your progress and recovery, your program may change. Your Inpatient Rehabilitation Care Coordinator will coordinate services and will keep you informed of any changes. Your Inpatient Rehabilitation Care Coordinator's name and contact numbers are listed  below.  The following services may also be recommended but are not provided by the [facility] will be made to provide these services after discharge if needed.  Arrangements include referral to agencies that provide these services.  Your insurance has been verified to be:  Medicare A/B  Your primary doctor is:   Billey Gosling  Pertinent information will be shared with your doctor and your insurance company.  Inpatient Rehabilitation Care Coordinator:  Cathleen Corti [phone] or [phone]  Information discussed with and copy given to patient by: Rana Snare, 09/20/2020, 9:10 AM

## 2020-09-20 NOTE — Progress Notes (Signed)
Physical Therapy Session Note  Patient Details  Name: Haley Roberts MRN: [id number] Date of Birth: 12/25/1945  Today's Date: 09/20/2020 PT Individual Time: 1000-1100, [id number] PT Individual Time Calculation (min): 68 min, 60 min  Short Term Goals: Week 1:  PT Short Term Goal 1 (Week 1): Pt will perform supine<>sit with +2 max assist PT Short Term Goal 2 (Week 1): Pt will perform bed<>chair transfers using LRAD with +2 max assist PT Short Term Goal 3 (Week 1): Pt will tolerate upright, OOB sitting for at least 1 hour between therapy sessions PT Short Term Goal 4 (Week 1): Pt will recall 2/3 spine precautions without cuing PT Short Term Goal 5 (Week 1): Pt will propel wheelchair 30f with min assist  Skilled Therapeutic Interventions/Progress Updates:    Session 1: pt received in w/c and agreeable to therapy. Pt reports 5/10 pain, therapy to tolerance. Pt propelled w/c with BUE x 50 ft. Pt performed slideboard transfer w/c<>mat table<> bed with +2 assist. Pt has difficulty with anterior weight shift. Session focused on increasing comfort and decr anxiety, pt performed sit ups from physioball moving into anterior weight shift. Pt demoed improving postural control throughout session. Pt returned to bed after session and was left with all needs in reach and alarm active.   Session 2: pt received in bed and agreeable to therapy. Pt reports her baseline level of pain is unchanged, therapy to tolerance.  Pt requested to use bedpan, mod-max A at times for rolling, dependent clothing management and hygiene. Donned pants in supine with total assist. Therapist noted dressing on back incision with serosanguinous drainage. Nursing changed dressing with direction from PA. Pt sat EOB throughout dressing change with mod A fading to min at times for upright sitting. Sit<>supine with max A x2. Adjusted knee immobilizer for better fit and function. Pt donned pullover shirt with min A for balance, donned pants  total A. Pt returned to supine, performed heel slides and ankle pumps for improved LE strength. Pt agreeable to work on BIL ankle pumps throughout the coming evening. Nsg and phlebotomy arrived in room, therapist exited session with bed alarm active and all needs in reach.   Therapy Documentation Precautions:  Precautions Precautions: Fall, Other (comment) Precaution Comments: Fear of falls, anxiety, back precautions (pt unable to verbalize any of 3) Required Braces or Orthoses: Knee Immobilizer - Right Knee Immobilizer - Right: On at all times Restrictions Weight Bearing Restrictions: Yes RLE Weight Bearing: Non weight bearing    Therapy/Group: Individual Therapy  OMickel Fuchs9/05/2020, 3:59 PM

## 2020-09-20 NOTE — Progress Notes (Signed)
Occupational Therapy Session Note  Patient Details  Name: Haley Roberts MRN: [id number] Date of Birth: [birth year]  Today's Date: 09/20/2020 OT Individual Time: [id number] OT Individual Time Calculation (min): 59 min    Short Term Goals: Week 1:  OT Short Term Goal 1 (Week 1): Pt will sit EOB wiht CGA for sitting balance for 5 min OT Short Term Goal 2 (Week 1): Pt will SB consistently wiht MOD A to w/c in prep for BSC transfers OT Short Term Goal 3 (Week 1): Pt will roll in bed wiht MOD A to decrease BOC for LB dressing OT Short Term Goal 4 (Week 1): Pt will recall 3/3 back precautions wiht use of external aides as needed  Skilled Therapeutic Interventions/Progress Updates:    Pt greeted at time of session in bed resting agreeable to OT session no pain resting but some back discomfort (no # given) and improved with rest. Donned pants bed level with total A threading RLE first and KI remained on throughout session. Supine > sit Max A and sitting EOB for several minutes to improve comfort for upright sitting, decreasing posterior weight shift, and decrease fear of falling. MD visiting as well as pt sitting EOB. Slowly progressing to decrease posterior lean and improving leaning FWD, 2nd helper providing posterior support. Slide board bed > wheelchair Max A x2 with severe fear of falling limiting and having LLE on wooden block. Set up at sink for oral hygiene and face washing seated in wheelchair having pt reach forward for items. Transported to/from room <> gym and focused on anterior weight shifting and reaching FWD for card matching game, tremors present d/t nervousness. Back in room set up in wheelchair alarm on call bell in reach.    Therapy Documentation Precautions:  Precautions Precautions: Fall, Other (comment) Precaution Comments: Fear of falls, anxiety, back precautions (pt unable to verbalize any of 3) Required Braces or Orthoses: Knee Immobilizer - Right Knee Immobilizer - Right:  On at all times Restrictions Weight Bearing Restrictions: Yes RLE Weight Bearing: Non weight bearing     Therapy/Group: Individual Therapy  [name] 09/20/2020, 7:16 AM

## 2020-09-20 NOTE — Progress Notes (Signed)
Patient reported to have new drainage from wound this afteroon. On exam--has serosanguinous drainage from pinpoint area at inferior aspect. No erythema, tenderness or pocket. Will start BID dressing changes with betadine and dry dressing. Will start Keflex for wound prophylaxis as immunocompromised--Arava. Will order CBC with diff in am.

## 2020-09-20 NOTE — Progress Notes (Signed)
PROGRESS NOTE   Subjective/Complaints:  Pt reports getting ready to get OOB with OT and AJ/tech- using equipment-  RLE throbbing/hurting- no worse or better since surgery- "HARD TO TELL IF BETTER SINCE IT'S NUMB", per pt.   Per pt, bowels ok now.   ROS:  Pt denies SOB, abd pain, CP, N/V/C/D, and vision changes  Objective:   DG Chest 2 View  Result Date: 09/18/2020 CLINICAL DATA:  Fever.  Post lumbar fusion 09/15/2020 EXAM: CHEST - 2 VIEW COMPARISON:  09/05/2010 FINDINGS: Upper normal heart size. Mild aortic tortuosity and atherosclerosis. Streaky retrocardiac opacities typical of atelectasis. No confluent airspace disease. No pleural effusion or pneumothorax. No pulmonary edema. Thoracic spondylosis with endplate spurring IMPRESSION: 1. Streaky retrocardiac opacities typical of atelectasis. 2. Mild aortic tortuosity and atherosclerosis. Electronically Signed   By: Keith Rake M.D.   On: 09/18/2020 20:43   Recent Labs    09/19/20 0457 09/20/20 0552  WBC 10.9* 9.7  HGB 10.2* 9.4*  HCT 32.0* 29.8*  PLT 355 413*   Recent Labs    09/18/20 0504 09/20/20 0552  NA 136 130*  K 3.9 3.8  CL 95* 89*  CO2 31 28  GLUCOSE 96 95  BUN 18 24*  CREATININE 0.62 0.58  CALCIUM 9.3 9.8    Intake/Output Summary (Last 24 hours) at 09/20/2020 1218 Last data filed at 09/20/2020 0700 Gross per 24 hour  Intake 1140 ml  Output 1600 ml  Net -460 ml        Physical Exam: Vital Signs Blood pressure 127/73, pulse 81, temperature 98.3 F (36.8 C), temperature source Oral, resp. rate 14, height '5\' 2"'$  (1.575 m), weight 68.3 kg, SpO2 97 %.    General: awake, alert, appropriate, working with OT and tech to get OOB to w/c; c/o pain in RLE; NAD HENT: conjugate gaze; oropharynx moist CV: regular rate; no JVD Pulmonary: CTA B/L; no W/R/R- good air movement GI: soft, NT, ND, (+)BS; slightly hypoactive Psychiatric: appropriate; perseverates  on pain/numbness Neurological: alert  Musculoskeletal:     Comments: Chronic RA changes in both hands and feet to a lesser extent. Heel cords a little tight.   Skin:    General: Skin is warm.     Comments: Back incision with honeycomb dressing.  shows some dark dried blood inside dressing- no surrounding erythema/heat Neurological:     Mental Status: She is alert and oriented to person, place, and time.     Comments: Alert and oriented x 3. Normal insight and awareness. Intact Memory. Normal language and speech. Cranial nerve exam unremarkable. Motor 5/5 in UE and 3/5 prox LLE to distal 4-/5 d/t pain inhibition. RLE limited by KI. Sensory loss in stocking glove distribution in both legs from just above ankles. DTR's 1+    Assessment/Plan: 1. Functional deficits which require 3+ hours per day of interdisciplinary therapy in a comprehensive inpatient rehab setting. Physiatrist is providing close team supervision and 24 hour management of active medical problems listed below. Physiatrist and rehab team continue to assess barriers to discharge/monitor patient progress toward functional and medical goals  Care Tool:  Bathing    Body parts bathed by patient: Right arm, Left  arm, Chest, Abdomen, Right upper leg, Left upper leg, Face   Body parts bathed by helper: Buttocks, Right lower leg, Left lower leg, Front perineal area     Bathing assist Assist Level: Maximal Assistance - Patient 24 - 49%     Upper Body Dressing/Undressing Upper body dressing   What is the patient wearing?: Pull over shirt    Upper body assist Assist Level: Moderate Assistance - Patient 50 - 74%    Lower Body Dressing/Undressing Lower body dressing      What is the patient wearing?: Pants     Lower body assist Assist for lower body dressing: Total Assistance - Patient < 25%     Toileting Toileting    Toileting assist Assist for toileting: 2 Helpers     Transfers Chair/bed transfer  Transfers  assist  Chair/bed transfer activity did not occur: Safety/medical concerns  Chair/bed transfer assist level: 2 Helpers     Locomotion Ambulation   Ambulation assist   Ambulation activity did not occur: Safety/medical concerns          Walk 10 feet activity   Assist  Walk 10 feet activity did not occur: Safety/medical concerns        Walk 50 feet activity   Assist Walk 50 feet with 2 turns activity did not occur: Safety/medical concerns         Walk 150 feet activity   Assist Walk 150 feet activity did not occur: Safety/medical concerns         Walk 10 feet on uneven surface  activity   Assist Walk 10 feet on uneven surfaces activity did not occur: Safety/medical concerns         Wheelchair     Assist Is the patient using a wheelchair?: Yes Type of Wheelchair: Manual Wheelchair activity did not occur: Safety/medical concerns         Wheelchair 50 feet with 2 turns activity    Assist    Wheelchair 50 feet with 2 turns activity did not occur: Safety/medical concerns       Wheelchair 150 feet activity     Assist  Wheelchair 150 feet activity did not occur: Safety/medical concerns       Blood pressure 127/73, pulse 81, temperature 98.3 F (36.8 C), temperature source Oral, resp. rate 14, height '5\' 2"'$  (1.575 m), weight 68.3 kg, SpO2 97 %.  Medical Problem List and Plan: 1.  Functional and mobility deficits secondary to lumbar stenosis with previous supracondylar right femur fx and right calcaneus fx after fall             -patient may shower             -ELOS/Goals: 18-20 days, goals min to mod assist with PT and OT  Continue CIR- PT, OT  2.  Antithrombotics: -DVT/anticoagulation:  Mechanical: Sequential compression devices, below knee Bilateral lower extremities             -antiplatelet therapy: N/A 3. Pain Management: Used hydrocodone 10 mg TID PTA. On prednisone taper with Oxycontin 10 mg bid and oxycodone prn.               --Flexeril TID, Gabapentin   9/5- just had med changes- is tolerable enough to do therapy, so won't change yet- pt said she doesn't think it's much different- pain wise- fyi 4. Mood: Team to provide ego support.              -antipsychotic agents: N/A 5. Neuropsych:  This patient is capable of making decisions on her own behalf. 6. Skin/Wound Care: Routine pressure relief measures. Monitor incision for healing.  7. Fluids/Electrolytes/Nutrition:  Monitor I/O.  --Check lytes in am. May need potassium supplement with HCTZ on board.   9/5- K+ 3.8- con't to monitor 8. HTN: Monitor BP TID--continue HCTZ and Cozaar 9. RA: Continue Arava daily with chronic prednisone (higher doses with taper currently).  10. H/o depression: Continue Wellbutrin and Lexapro.  11. Leukocytosis: up again sightly on 9/4- fever of 100.8 over night: discussed with patient that UA and CXR show no infection, blood cultures with no growth, procal .11- no abx treatment recommended.              -wounds look ok  9/5- will d/c honeycomb dressing and place dry dressing- WBC down to 9.7- con't to monitor 12. Pre-diabetes: Hyperglycemia due to increase in steroids. Will monitor FBS for now.  13. Hypocalcemia: Add Calcium supplement.  14. Constipation: Will add Senna S two tabs with supper. Add Sorbitol   9/5- had good BM- con't regimen for now- yesterday and this AM.  15. Urinary retention: Has not urinated since foley came out early am/last pm? ?neurogenic bladder.             --will check voiding with PVR checks. Work on constipation relief  9/5- checking Bladder scans and cathing for ~ 400cc- cannot start Flomax- had seizures with Sulfa- will d/w Urology tomorrow 16. Low albumin: add Juven to promote wound healing    LOS: 3 days A FACE TO FACE EVALUATION WAS PERFORMED  Kaeleb Emond 09/20/2020, 12:18 PM

## 2020-09-20 NOTE — IPOC Note (Signed)
Overall Plan of Care [facility]) Patient Details Name: Haley Roberts MRN: [id number] DOB: 09-13-1945  Admitting Diagnosis: Status post lumbar surgery  Hospital Problems: Principal Problem:   Status post lumbar surgery     Functional Problem List: Nursing Edema, Endurance, Medication Management, Pain, Safety, Skin Integrity  PT Balance, Perception, Behavior, Safety, Edema, Sensory, Endurance, Skin Integrity, Motor, Nutrition, Pain  OT Balance, Cognition, Motor, Endurance, Pain, Safety, Skin Integrity  SLP    TR         Basic ADL's: OT Grooming, Bathing, Dressing, Toileting     Advanced  ADL's: OT       Transfers: PT Bed Mobility, Bed to Chair, Car, Manufacturing systems engineer, Metallurgist: PT Ambulation, Emergency planning/management officer, Stairs     Additional Impairments: OT None  SLP        TR      Anticipated Outcomes Item Anticipated Outcome  Self Feeding no goal  Swallowing      Basic self-care  S  Toileting  S   Bathroom Transfers S  Bowel/Bladder  n/a  Transfers  min/mod assist with LRAD  Locomotion  do not anticipate pt will be a functional ambulatory at D/C  Communication     Cognition     Pain  < 4  Safety/Judgment  min assist and no falls   Therapy Plan: PT Intensity: Minimum of 1-2 x/day ,45 to 90 minutes PT Frequency: 5 out of 7 days PT Duration Estimated Length of Stay: ~3.5 weeks OT Intensity: Minimum of 1-2 x/day, 45 to 90 minutes OT Frequency: 5 out of 7 days OT Duration/Estimated Length of Stay: 3 weeks     Due to the current state of emergency, patients may not be receiving their 3-hours of Medicare-mandated therapy.   Team Interventions: Nursing Interventions Patient/Family Education, Disease Management/Prevention, Pain Management, Medication Management, Skin Care/Wound Management, Discharge Planning  PT interventions Ambulation/gait training, Community reintegration, DME/adaptive equipment instruction, Neuromuscular  re-education, Psychosocial support, UE/LE Strength taining/ROM, Wheelchair propulsion/positioning, Training and development officer, Discharge planning, Functional electrical stimulation, Pain management, Skin care/wound management, Therapeutic Activities, UE/LE Coordination activities, Cognitive remediation/compensation, Disease management/prevention, Functional mobility training, Patient/family education, Splinting/orthotics, Therapeutic Exercise, Visual/perceptual remediation/compensation  OT Interventions Training and development officer, DME/adaptive equipment instruction, Patient/family education, Therapeutic Activities, Wheelchair propulsion/positioning, Therapeutic Exercise, Psychosocial support, Cognitive remediation/compensation, Community reintegration, Functional mobility training, Self Care/advanced ADL retraining, UE/LE Strength taining/ROM, Discharge planning, Neuromuscular re-education, Skin care/wound managment, UE/LE Coordination activities, Splinting/orthotics, Pain management, Disease mangement/prevention, Visual/perceptual remediation/compensation  SLP Interventions    TR Interventions    SW/CM Interventions Discharge Planning, Psychosocial Support, Patient/Family Education   Barriers to Discharge MD  Medical stability, Home enviroment access/loayout, Incontinence, Neurogenic bowel and bladder, Wound care, Lack of/limited family support, Weight, and Weight bearing restrictions  Nursing Decreased caregiver support, Home environment access/layout, Wound Care, Lack of/limited family support, Weight, Weight bearing restrictions, Medication compliance Lives in 2 level home with spouse who has ALS. Ramped entrance and and stair lift to 2nd level. Can live on main level with bedroom/bathroom. Son Haley Roberts can provide mod assist.  PT Decreased caregiver support, Lack of/limited family support, Weight bearing restrictions    OT Decreased caregiver support, Weight bearing restrictions, Weight    SLP       SW       Team Discharge Planning: Destination: PT-Home ,OT- Home , SLP-  Projected Follow-up: PT-Home health PT, 24 hour supervision/assistance, OT-  Home health OT, SLP-  Projected Equipment Needs: PT-To be determined, OT- None recommended by OT,  SLP-  Equipment Details: PT- , OT-  Patient/family involved in discharge planning: PT- Patient,  OT-Patient, SLP-   MD ELOS: 2.5 to 3 weeks Medical Rehab Prognosis:  Good Assessment: Pt is a 75 yr old female with worsenings/progressive back pain as well as recent falls- s/o L3-L5 fusion as well as R femur fx and R calcaneal fx after fall-  Also having urinary retention- allergic to Sulfa so cann't use flomax  Goals Supervision to min Assist-     See Team Conference Notes for weekly updates to the plan of care

## 2020-09-21 DIAGNOSIS — Z9889 Other specified postprocedural states: Secondary | ICD-10-CM | POA: Diagnosis not present

## 2020-09-21 LAB — URINALYSIS, MICROSCOPIC (REFLEX)

## 2020-09-21 LAB — BASIC METABOLIC PANEL
Anion gap: 10 (ref 5–15)
BUN: 31 mg/dL — ABNORMAL HIGH (ref 8–23)
CO2: 26 mmol/L (ref 22–32)
Calcium: 9.4 mg/dL (ref 8.9–10.3)
Chloride: 93 mmol/L — ABNORMAL LOW (ref 98–111)
Creatinine, Ser: 0.66 mg/dL (ref 0.44–1.00)
GFR, Estimated: >60 mL/min (ref >60)
Glucose, Bld: 105 mg/dL — ABNORMAL HIGH (ref 70–99)
Potassium: 4.1 mmol/L (ref 3.5–5.1)
Sodium: 129 mmol/L — ABNORMAL LOW (ref 135–145)

## 2020-09-21 LAB — URINALYSIS, ROUTINE W REFLEX MICROSCOPIC
Bilirubin Urine: NEGATIVE
Glucose, UA: NEGATIVE mg/dL
Ketones, ur: NEGATIVE mg/dL
Nitrite: POSITIVE — AB
Protein, ur: NEGATIVE mg/dL
Specific Gravity, Urine: 1.015 (ref 1.005–1.030)
pH: 7.5 (ref 5.0–8.0)

## 2020-09-21 LAB — CBC WITH DIFFERENTIAL/PLATELET
Abs Immature Granulocytes: 0.25 10*3/uL — ABNORMAL HIGH (ref 0.00–0.07)
Basophils Absolute: 0.1 10*3/uL (ref 0.0–0.1)
Basophils Relative: 1 %
Eosinophils Absolute: 0.2 10*3/uL (ref 0.0–0.5)
Eosinophils Relative: 2 %
HCT: 29.4 % — ABNORMAL LOW (ref 36.0–46.0)
Hemoglobin: 9.7 g/dL — ABNORMAL LOW (ref 12.0–15.0)
Immature Granulocytes: 2 %
Lymphocytes Relative: 9 %
Lymphs Abs: 1.1 10*3/uL (ref 0.7–4.0)
MCH: 29.3 pg (ref 26.0–34.0)
MCHC: 33 g/dL (ref 30.0–36.0)
MCV: 88.8 fL (ref 80.0–100.0)
Monocytes Absolute: 1.3 10*3/uL — ABNORMAL HIGH (ref 0.1–1.0)
Monocytes Relative: 11 %
Neutro Abs: 9.1 10*3/uL — ABNORMAL HIGH (ref 1.7–7.7)
Neutrophils Relative %: 75 %
Platelets: 399 10*3/uL (ref 150–400)
RBC: 3.31 MIL/uL — ABNORMAL LOW (ref 3.87–5.11)
RDW: 14 % (ref 11.5–15.5)
WBC: 12.1 10*3/uL — ABNORMAL HIGH (ref 4.0–10.5)
nRBC: 0 % (ref 0.0–0.2)

## 2020-09-21 MED ORDER — BETHANECHOL CHLORIDE 10 MG PO TABS
10.0000 mg | ORAL_TABLET | Freq: Three times a day (TID) | ORAL | Status: DC
  Administered 2020-09-21 – 2020-10-01 (×31): 10 mg via ORAL
  Filled 2020-09-21 (×31): qty 1

## 2020-09-21 NOTE — Progress Notes (Addendum)
Patient ID: Haley Roberts, female   DOB: [birth year], 75 y.o.   MRN: [id number] Team Conference Report to Patient/Family  Team Conference discussion was reviewed with the patient and caregiver, including goals, any changes in plan of care and target discharge date.  Patient and caregiver express understanding and are in agreement.  The patient has a target discharge date of  (2 weeks).  Sw met with pt, called son at bedside. Provided conference updates. Pt reports sleep is ok and Oxy and PRN meds are assisting with pain. No additional questions or concerns, SW will cont to follow up with d/c date  Dyanne Iha 09/21/2020, 1:00 PM

## 2020-09-21 NOTE — Progress Notes (Signed)
Inpatient Rehabilitation  Patient information reviewed and entered into eRehab system by Tayonna Bacha M. Lavene Penagos, M.A., CCC/SLP, PPS Coordinator.  Information including medical coding, functional ability and quality indicators will be reviewed and updated through discharge.    

## 2020-09-21 NOTE — Progress Notes (Signed)
PROGRESS NOTE   Subjective/Complaints:  Pt reports that incision was draining yesterday and got betadine painting and keflex TID per PA- Also said she "finally had BM".  Still requiring caths- no voiding. WBC up to 12.1- will check U/A and Cx- was 4 days ago last checked, however since pt still retaining and cannot void at all, most likely cause of increased WBC's.  Also severely allergic to Flomax/Sulfa, so will give bethanachol.    ROS:   Pt denies SOB, abd pain, CP, N/V/C/D, and vision changes   Objective:   No results found. Recent Labs    09/20/20 0552 09/21/20 0518  WBC 9.7 12.1*  HGB 9.4* 9.7*  HCT 29.8* 29.4*  PLT 413* 399   Recent Labs    09/20/20 0552 09/21/20 0518  NA 130* 129*  K 3.8 4.1  CL 89* 93*  CO2 28 26  GLUCOSE 95 105*  BUN 24* 31*  CREATININE 0.58 0.66  CALCIUM 9.8 9.4    Intake/Output Summary (Last 24 hours) at 09/21/2020 1014 Last data filed at 09/21/2020 0245 Gross per 24 hour  Intake 320 ml  Output 1100 ml  Net -780 ml        Physical Exam: Vital Signs Blood pressure 112/65, pulse 88, temperature 99.4 F (37.4 C), resp. rate 14, height '5\' 2"'$  (1.575 m), weight 68.3 kg, SpO2 96 %.     General: awake, alert, appropriate, laying supine in bed; nurse in room; NAD HENT: conjugate gaze; oropharynx moist CV: regular rate; no JVD Pulmonary: CTA B/L; no W/R/R- good air movement- sounds great GI: soft, NT, ND, (+)BS; normoactive Psychiatric: appropriate Neurological: Ox3   Musculoskeletal:     Comments: Chronic RA changes in both hands and feet to a lesser extent. Heel cords a little tight.   Skin:  Back incision painted with betadine- no drainage seen on dressing- localized erythema. But doesn't appear infected currently  Neurological:     Mental Status: She is alert and oriented to person, place, and time.     Comments: Alert and oriented x 3. Normal insight and awareness.  Intact Memory. Normal language and speech. Cranial nerve exam unremarkable. Motor 5/5 in UE and 3/5 prox LLE to distal 4-/5 d/t pain inhibition. RLE limited by KI. Sensory loss in stocking glove distribution in both legs from just above ankles. DTR's 1+    Assessment/Plan: 1. Functional deficits which require 3+ hours per day of interdisciplinary therapy in a comprehensive inpatient rehab setting. Physiatrist is providing close team supervision and 24 hour management of active medical problems listed below. Physiatrist and rehab team continue to assess barriers to discharge/monitor patient progress toward functional and medical goals  Care Tool:  Bathing    Body parts bathed by patient: Right arm, Left arm, Chest, Abdomen, Right upper leg, Left upper leg, Face   Body parts bathed by helper: Buttocks, Right lower leg, Left lower leg, Front perineal area     Bathing assist Assist Level: Maximal Assistance - Patient 24 - 49%     Upper Body Dressing/Undressing Upper body dressing   What is the patient wearing?: Pull over shirt    Upper body assist Assist Level: Moderate  Assistance - Patient 50 - 74%    Lower Body Dressing/Undressing Lower body dressing      What is the patient wearing?: Pants     Lower body assist Assist for lower body dressing: Total Assistance - Patient < 25%     Toileting Toileting    Toileting assist Assist for toileting: 2 Helpers     Transfers Chair/bed transfer  Transfers assist  Chair/bed transfer activity did not occur: Safety/medical concerns  Chair/bed transfer assist level: 2 Helpers     Locomotion Ambulation   Ambulation assist   Ambulation activity did not occur: Safety/medical concerns          Walk 10 feet activity   Assist  Walk 10 feet activity did not occur: Safety/medical concerns        Walk 50 feet activity   Assist Walk 50 feet with 2 turns activity did not occur: Safety/medical concerns         Walk  150 feet activity   Assist Walk 150 feet activity did not occur: Safety/medical concerns         Walk 10 feet on uneven surface  activity   Assist Walk 10 feet on uneven surfaces activity did not occur: Safety/medical concerns         Wheelchair     Assist Is the patient using a wheelchair?: Yes Type of Wheelchair: Manual Wheelchair activity did not occur: Safety/medical concerns         Wheelchair 50 feet with 2 turns activity    Assist    Wheelchair 50 feet with 2 turns activity did not occur: Safety/medical concerns       Wheelchair 150 feet activity     Assist  Wheelchair 150 feet activity did not occur: Safety/medical concerns       Blood pressure 112/65, pulse 88, temperature 99.4 F (37.4 C), resp. rate 14, height '5\' 2"'$  (1.575 m), weight 68.3 kg, SpO2 96 %.  Medical Problem List and Plan: 1.  Functional and mobility deficits secondary to lumbar stenosis with previous supracondylar right femur fx and right calcaneus fx after fall             -patient may shower             -ELOS/Goals: 18-20 days, goals min to mod assist with PT and OT  Continue CIR- PT, OT  2.  Antithrombotics: -DVT/anticoagulation:  Mechanical: Sequential compression devices, below knee Bilateral lower extremities  9/6- surgery was 6 days ago- per NSU- cannot start DVT meds until 7+ days.              -antiplatelet therapy: N/A 3. Pain Management: Used hydrocodone 10 mg TID PTA. On prednisone taper with Oxycontin 10 mg bid and oxycodone prn.              --Flexeril TID, Gabapentin   9/5- just had med changes- is tolerable enough to do therapy, so won't change yet- pt said she doesn't think it's much different- pain wise- fyi 4. Mood: Team to provide ego support.              -antipsychotic agents: N/A 5. Neuropsych: This patient is capable of making decisions on her own behalf. 6. Skin/Wound Care: Routine pressure relief measures. Monitor incision for healing.  7.  Fluids/Electrolytes/Nutrition:  Monitor I/O.  --Check lytes in am. May need potassium supplement with HCTZ on board.   9/5- K+ 3.8- con't to monitor 8. HTN: Monitor BP TID--continue HCTZ and Cozaar  9. RA: Continue Arava daily with chronic prednisone (higher doses with taper currently).  10. H/o depression: Continue Wellbutrin and Lexapro.  11. Leukocytosis: up again sightly on 9/4- fever of 100.8 over night: discussed with patient that UA and CXR show no infection, blood cultures with no growth, procal .11- no abx treatment recommended.              -wounds look ok  9/5- will d/c honeycomb dressing and place dry dressing- WBC down to 9.7- con't to monitor  9/6- started on Keflex yesterday for incision- looks better today- also will check U/A since WBC up to 12.1- has been 4 days since checked- could even be (-) since keflex started- will monitor- of note Tm=Tc 99.4.  12. Pre-diabetes: Hyperglycemia due to increase in steroids. Will monitor FBS for now.  13. Hypocalcemia: Add Calcium supplement.  14. Constipation: Will add Senna S two tabs with supper. Add Sorbitol   9/5- had good BM- con't regimen for now- yesterday and this AM.  15. Urinary retention: Has not urinated since foley came out early am/last pm? ?neurogenic bladder.             --will check voiding with PVR checks. Work on constipation relief  9/5- checking Bladder scans and cathing for ~ 400cc- cannot start Flomax- had seizures with Sulfa- will d/w Urology tomorrow  9/6- will start Bethanachol 10 mg TID for now. And treat for possible UTI? 16. Low albumin: add Juven to promote wound healing    LOS: 4 days A FACE TO FACE EVALUATION WAS PERFORMED  Haley Roberts 09/21/2020, 10:14 AM

## 2020-09-21 NOTE — Progress Notes (Signed)
Occupational Therapy Session Note  Patient Details  Name: Haley Roberts MRN: [id number] Date of Birth: Dec 18, 1945  Today's Date: 09/21/2020 OT Individual Time: [phone] (2nd session) and 1300-1354 (1st session) OT Individual Time Calculation (min): 57 min (2nd) and 54 min (1st)   Short Term Goals: Week 1:  OT Short Term Goal 1 (Week 1): Pt will sit EOB wiht CGA for sitting balance for 5 min OT Short Term Goal 2 (Week 1): Pt will SB consistently wiht MOD A to w/c in prep for BSC transfers OT Short Term Goal 3 (Week 1): Pt will roll in bed wiht MOD A to decrease BOC for LB dressing OT Short Term Goal 4 (Week 1): Pt will recall 3/3 back precautions wiht use of external aides as needed   Skilled Therapeutic Interventions/Progress Updates:    Session 1: Pt greeted at time of session sitting up in wheelchair no pain but states she has been having spasms through LE's and back, RN aware and provided med pass later in session. Pt transported to/from room <> ortho gym dependent for time management and attempted  slide board placement under R hip with pillow case over board as pt not wearing pants and unable to perform transfer d/t fear of falling. Transported back to room and slide board wheelchair > bed total A of 2. Pt noted to be incontinent of bowels and sit > supine 2 helpers. Brief change dependent for doffing brief and Max A rolling L/R d/t fear of falling. Education throughout for back precautions, anterior weight shifting, and body mechanics but pt severely limited throughout. RN present at end of session as BM had gotten in back dressings. Hand off to RN.  Session 2: Pt greeted at time of session semireclined in bed, no pain reported but did have some back spasms later in session relieved with positional changes. Pt stating at first she was on bed pan, requesting a few minutes of privacy. When returned to room, pt stating she was not on the bed pan leading to conflicting stories, and confirmed  she was not on bed pan. Donned pants bed level total A with assist to thread and encouraging pt to help pull up to hip level, rolling Max A L/R to don over hips. Supine > sit 2 helpers and sitting EOB attempted to improve comfort by having pt perform slight weight shifts with hands on therapists shoulders. Slide board bed > wheelchair total A x2 with difficulty anterior weight shifting and placing hands on board. In gym, focused on reaching for cones out of BOS and reaching FWD, placing on R side on chair for 2 x 8 cones. Slide board back to bed total A x2 with pt attempting to hold on to therapist and lean backwards, total A to fix. Sit > supine 2 helpers. Alarm on call bell in reach.    Therapy Documentation Precautions:  Precautions Precautions: Fall, Other (comment) Precaution Comments: Fear of falls, anxiety, back precautions (pt unable to verbalize any of 3) Required Braces or Orthoses: Knee Immobilizer - Right Knee Immobilizer - Right: On at all times Restrictions Weight Bearing Restrictions: Yes RLE Weight Bearing: Non weight bearing     Therapy/Group: Individual Therapy  [name] 09/21/2020, 7:21 AM

## 2020-09-21 NOTE — Progress Notes (Signed)
Physical Therapy Session Note  Patient Details  Name: Haley Roberts MRN: [id number] Date of Birth: 1945/07/06  Today's Date: 09/21/2020 PT Individual Time: 0930-1030 PT Individual Time Calculation (min): 60 min   Short Term Goals: Week 1:  PT Short Term Goal 1 (Week 1): Pt will perform supine<>sit with +2 max assist PT Short Term Goal 2 (Week 1): Pt will perform bed<>chair transfers using LRAD with +2 max assist PT Short Term Goal 3 (Week 1): Pt will tolerate upright, OOB sitting for at least 1 hour between therapy sessions PT Short Term Goal 4 (Week 1): Pt will recall 2/3 spine precautions without cuing PT Short Term Goal 5 (Week 1): Pt will propel wheelchair 43f with min assist  Skilled Therapeutic Interventions/Progress Updates:    pt received in bed and agreeable to therapy. Pt reported pain in her back and Les, premedicated and did not interfere with therapy. Pt requested to use bedpan, total A to use bedpan. Pt required +2 assist for slideboard transfer. Pt continues to have difficulty maintaining sitting balance, especially anterior weight shift. Pt remained in w/c at end of session and was left with all needs in reach and alarm active.   Therapy Documentation Precautions:  Precautions Precautions: Fall, Other (comment) Precaution Comments: Fear of falls, anxiety, back precautions (pt unable to verbalize any of 3) Required Braces or Orthoses: Knee Immobilizer - Right Knee Immobilizer - Right: On at all times Restrictions Weight Bearing Restrictions: Yes RLE Weight Bearing: Non weight bearing     Therapy/Group: Individual Therapy  OMickel Fuchs9/06/2020, 4:31 PM

## 2020-09-21 NOTE — Patient Care Conference (Signed)
Inpatient RehabilitationTeam Conference and Plan of Care Update Date: 09/21/2020   Time: 11:35 AM    Patient Name: Haley Roberts      Medical Record Number: [id number]  Date of Birth: 02-05-1945 Sex: Female         Room/Bed: 4W10C/4W10C-01 Payor Info: Payor: MEDICARE / Plan: MEDICARE PART A AND B / Product Type: *No Product type* /    Admit Date/Time:  09/17/2020  2:52 PM  Primary Diagnosis:  Status post lumbar surgery  Hospital Problems: Principal Problem:   Status post lumbar surgery    Expected Discharge Date: Expected Discharge Date:  (2 weeks)  Team Members Present: Physician leading conference: Dr. Courtney Heys Social Worker Present: [name], BSW Nurse Present: Dorthula Nettles, RN PT Present: Ailene Rud, PT OT Present: Lillia Corporal, OT PPS Coordinator present : [name], SLP     Current Status/Progress Goal Weekly Team Focus  Bowel/Bladder   Requires in and out cathq6. Continent of bowel, last BM 9/5.  Remain continent of bowel function. Patient to be able to gain bladder function.  Monitor changes in bowel and bladder function.   Swallow/Nutrition/ Hydration             ADL's   Max of 2 slide board, VERY fearful of falling, total A LB dress bed level, posterior lean, bathing EOB or sink total A for LB  Min LB bathe/dress, Mod toileting, Min transfers  core strength, sitting balance, anterior weight shift, standing when able, slide board transfers, ADL retraining, AE training   Mobility   total A of 2 SB transfer, mod A supine<>sit  mod A STS, min A transfers, supervision w/c  SB transfers, anterior weight shift, core strength   Communication             Safety/Cognition/ Behavioral Observations            Pain   Pain 8/10 to lower back.  Pain to remain at 2/10.  Assess pain q shift and medicate prn.   Skin   Dry dressing to back area from lumbar surgery.  No skin breakdown.  Assess skin q shift and prn.     Discharge Planning:  Pt  discharging home with son as primary caregiver. Avaliable 24/7   Team Discussion: Incision draining yesterday, Keflex started. WBC's elevated, possible UTI. [name] started today, will start [name] for anxiety. I&O cathing every 6 hr. Incontinent bowel due to soft stools. PRN's and scheduled pain medications working. Honeycomb dressing off of back incision. Max/total assist +2 slide board. Fear of falling is extreme and limiting. Total assist at bed level. Total assist +2 slide board, mod assist bed mobility. Will lean forward then will collapse into therapist. Working on weight shifting.   Patient on target to meet rehab goals: Very slow progress, fear of falling is limiting.  *See Care Plan and progress notes for long and short-term goals.   Revisions to Treatment Plan:  MD making medication adjustment.  Teaching Needs: Family education, medication management, anxiety management, pain management, I&O cath education, transfer training, gait training, balance training, endurance training, safety awareness.  Current Barriers to Discharge: Decreased caregiver support, Medical stability, Home enviroment access/layout, Incontinence, Neurogenic bowel and bladder, Wound care, Lack of/limited family support, Weight, and Medication compliance  Possible Resolutions to Barriers: Continue current medications, provide emotional support.     Medical Summary Current Status: Na 129- cathing q6 hours; stools soft; incision draining improved; WBC 12.1k- has elevated- on keflex for incision erythema yesterday-  incision looks better  Barriers to Discharge: Wound care;Medical stability;Decreased family/caregiver support;Home enviroment access/layout;Neurogenic Bowel & Bladder;Behavior  Barriers to Discharge Comments: on Keflex/new;  son to hlep her at home- was caregiver for husband; anxious/fear of falling; very limiting; Possible Resolutions to [employer] Focus: focus- WBC work up- and restrict fluids  due to NA of 129- urinary retnetion- started bethanachol for urinary retention- will start [name] for anxiety- 2 weeks MORE for d/c   Continued Need for Acute Rehabilitation Level of Care: The patient requires daily medical management by a physician with specialized training in physical medicine and rehabilitation for the following reasons: Direction of a multidisciplinary physical rehabilitation program to maximize functional independence : Yes Medical management of patient stability for increased activity during participation in an intensive rehabilitation regime.: Yes Analysis of laboratory values and/or radiology reports with any subsequent need for medication adjustment and/or medical intervention. : Yes   I attest that I was present, lead the team conference, and concur with the assessment and plan of the team.   Cristi Loron 09/21/2020, 3:47 PM

## 2020-09-22 ENCOUNTER — Telehealth: Payer: Self-pay

## 2020-09-22 DIAGNOSIS — Z9889 Other specified postprocedural states: Secondary | ICD-10-CM | POA: Diagnosis not present

## 2020-09-22 LAB — BASIC METABOLIC PANEL
Anion gap: 9 (ref 5–15)
BUN: 26 mg/dL — ABNORMAL HIGH (ref 8–23)
CO2: 29 mmol/L (ref 22–32)
Calcium: 9.6 mg/dL (ref 8.9–10.3)
Chloride: 91 mmol/L — ABNORMAL LOW (ref 98–111)
Creatinine, Ser: 0.61 mg/dL (ref 0.44–1.00)
GFR, Estimated: >60 mL/min (ref >60)
Glucose, Bld: 107 mg/dL — ABNORMAL HIGH (ref 70–99)
Potassium: 4.2 mmol/L (ref 3.5–5.1)
Sodium: 129 mmol/L — ABNORMAL LOW (ref 135–145)

## 2020-09-22 LAB — URINALYSIS, ROUTINE W REFLEX MICROSCOPIC
Bilirubin Urine: NEGATIVE
Glucose, UA: NEGATIVE mg/dL
Hgb urine dipstick: NEGATIVE
Ketones, ur: 15 mg/dL — AB
Leukocytes,Ua: NEGATIVE
Nitrite: NEGATIVE
Protein, ur: NEGATIVE mg/dL
Specific Gravity, Urine: 1.025 (ref 1.005–1.030)
pH: 6 (ref 5.0–8.0)

## 2020-09-22 LAB — URINE CULTURE

## 2020-09-22 MED ORDER — CYCLOBENZAPRINE HCL 5 MG PO TABS
5.0000 mg | ORAL_TABLET | Freq: Three times a day (TID) | ORAL | Status: DC
  Administered 2020-09-22 – 2020-10-07 (×44): 5 mg via ORAL
  Filled 2020-09-22 (×45): qty 1

## 2020-09-22 MED ORDER — DIAZEPAM 5 MG PO TABS
2.5000 mg | ORAL_TABLET | Freq: Four times a day (QID) | ORAL | Status: DC | PRN
  Filled 2020-09-22: qty 1

## 2020-09-22 MED ORDER — CEFADROXIL 500 MG PO CAPS
1000.0000 mg | ORAL_CAPSULE | Freq: Every day | ORAL | Status: DC
  Administered 2020-09-22 – 2020-09-23 (×2): 1000 mg via ORAL
  Filled 2020-09-22 (×2): qty 2

## 2020-09-22 MED ORDER — BUSPIRONE HCL 5 MG PO TABS
5.0000 mg | ORAL_TABLET | Freq: Three times a day (TID) | ORAL | Status: DC
  Administered 2020-09-22 – 2020-10-07 (×46): 5 mg via ORAL
  Filled 2020-09-22 (×46): qty 1

## 2020-09-22 MED ORDER — OXYCODONE HCL 5 MG PO TABS
5.0000 mg | ORAL_TABLET | Freq: Four times a day (QID) | ORAL | Status: DC | PRN
  Administered 2020-09-24 – 2020-10-04 (×9): 5 mg via ORAL
  Filled 2020-09-22 (×10): qty 1

## 2020-09-22 MED ORDER — LIDOCAINE 5 % EX PTCH
2.0000 | MEDICATED_PATCH | CUTANEOUS | Status: DC
  Administered 2020-09-22 – 2020-10-06 (×15): 2 via TRANSDERMAL
  Filled 2020-09-22 (×14): qty 2

## 2020-09-22 NOTE — Progress Notes (Signed)
Pt complaining of stiff neck. Offered to get pt a k pad. Pt declined. Pt instructed to let this nurse know when she wants one so that it can be ordered.

## 2020-09-22 NOTE — Progress Notes (Signed)
Physical Therapy Session Note  Patient Details  Name: Haley Roberts MRN: [id number] Date of Birth: [birth year]  Today's Date: 09/22/2020 PT Individual Time: 1300-1415 PT Individual Time Calculation (min): 75 min   Short Term Goals: Week 1:  PT Short Term Goal 1 (Week 1): Pt will perform supine<>sit with +2 max assist PT Short Term Goal 2 (Week 1): Pt will perform bed<>chair transfers using LRAD with +2 max assist PT Short Term Goal 3 (Week 1): Pt will tolerate upright, OOB sitting for at least 1 hour between therapy sessions PT Short Term Goal 4 (Week 1): Pt will recall 2/3 spine precautions without cuing PT Short Term Goal 5 (Week 1): Pt will propel wheelchair 91f with min assist  Skilled Therapeutic Interventions/Progress Updates:    pt on bed pan on arrival. Pt reports intense pain in her neck, back, and legs. Pt agreeable to manual therapy to reduce pain. Nursing present to help pt off bed pan and perform in and out cath. Therapist assisted with rolling and distracting pt during cath.   Therapist performed manual therapy to cervical area, including gentle distraction and joint mobilization, and STM to SCM and upper traps. Pt demoes extreme limitation in mobility and mild torticollis-like posturing. Pt able to achieve improved pain free ROM with manual therapy and stretching.   Pt participated in meditation and visualization for pain management, reporting some improvement in pain. Pt able to tolerate elevated HOB so she could reach her tray and eat lunch.   Pt was mildly disoriented at times throughout session, but easily redirected. Pt remained in bed and was left with all needs in reach and alarm active.   Therapy Documentation Precautions:  Precautions Precautions: Fall, Other (comment) Precaution Comments: Fear of falls, anxiety, back precautions (pt unable to verbalize any of 3) Required Braces or Orthoses: Knee Immobilizer - Right Knee Immobilizer - Right: On at all  times Restrictions Weight Bearing Restrictions: Yes RLE Weight Bearing: Non weight bearing    Therapy/Group: Individual Therapy  OMickel Fuchs9/07/2020, 3:06 PM

## 2020-09-22 NOTE — Progress Notes (Signed)
Occupational Therapy Session Note  Patient Details  Name: Haley Roberts MRN: [id number] Date of Birth: November 26, 1945  Today's Date: 09/22/2020 OT Individual Time: [id number] and [id number] OT Individual Time Calculation (min): 53 min and 15 mins Missed 45 mins of OT in PM session d/t pain and fatigue   Short Term Goals: Week 1:  OT Short Term Goal 1 (Week 1): Pt will sit EOB wiht CGA for sitting balance for 5 min OT Short Term Goal 2 (Week 1): Pt will SB consistently wiht MOD A to w/c in prep for BSC transfers OT Short Term Goal 3 (Week 1): Pt will roll in bed wiht MOD A to decrease BOC for LB dressing OT Short Term Goal 4 (Week 1): Pt will recall 3/3 back precautions wiht use of external aides as needed   Skilled Therapeutic Interventions/Progress Updates:    Session 1: Pt greeted at time of session semireclined in bed resting had not eaten breakfast, stating UE/neck pain too intense and did not want the food. Neck/upper back pain has becoming intense this morning, MD already aware and relayed to therapy this am. Pt noted to be in poor position in bed, repositioned total A for midline positioning and applied moist heat pack to upper trap/shoulders for 10 mins and skin in tact pre and post. Focus of session on BUE ROM with PROM/AAROM for B shoulders for circumduction, flexion/extension patterns with therapist facilitation at scapula as well, note pt with intense muscle guarding throughout, difficulty relaxing. Scapular squeezes into mattress for 1x10 with max tactile cues and difficulty processing. Cervical ROM with nodding in flexion/extension, attempted chin passes back and forth from shoulder but all with max difficulty and muscle guarding. STM for upper neck/traps to tolerance in attempt to decrease pain, some relief noted but minimal. Note pt with difficulty following commands, nervousness and anxiety with all movement. Oral hygiene and face washing bed level Supervision with HOB elevated,  pillow to try to decrease extensor pattern. Pt attempting to brush teeth before toothbrush in hand as well.   Session 2: Pt greeted at time of session semireclined in bed in poor position leaning to L side, pt c/o intense spasms and pain, repositioned bed level dependent for hips and trunk. Pt noted to not have eaten lunch, Max A for self feeding 3 bites of fruit with max-total A assist to spear food and bring to mouth as pt had difficulty sequencing and processing. Max A for drink as well with hand over hand to bring to mouth but pt was able to swallow with good clearance in upright position bed level. Note cognition worsened compared to previous sessions, spoke with RN who is aware and had just given pain meds prior to session. Missed 45 mins d/t pain and fatigue.   Therapy Documentation Precautions:  Precautions Precautions: Fall, Other (comment) Precaution Comments: Fear of falls, anxiety, back precautions (pt unable to verbalize any of 3) Required Braces or Orthoses: Knee Immobilizer - Right Knee Immobilizer - Right: On at all times Restrictions Weight Bearing Restrictions: Yes RLE Weight Bearing: Non weight bearing     Therapy/Group: Individual Therapy  [name] 09/22/2020, 7:15 AM

## 2020-09-22 NOTE — Progress Notes (Signed)
Spoke with son Haley Roberts) regarding concerns about his mother. He has noticed that in the past 2-3 days she has been worsening. She is more confused, rambling with inappropriate comments to questions asked and has been having hard time staying awake. He expressed concerns regarding sedating meds/narcotics which were on board. Patient was somnolent and had difficulty interacting with off hand comments. She continues to report back pain rates it "9". We reviewed her meds-->d/c ultram which she did have this am, decreased oxycodone to 5 mg every 6 hours and ordered repeat UA/UCS as culture negative despite positive UA. K pad ordered for local measures as she uses this at home.   Discussed how infection as well as low sodium having effect on mentation with lethargy and confusion--inflammatory markers not sensitive as she is on immunosuppressive meds. Will repeat BMET to see if sodium has dropped further-->patient has not eaten all day and may need IVF.  Labs ordered for am also.

## 2020-09-22 NOTE — Progress Notes (Signed)
PROGRESS NOTE   Subjective/Complaints:  Pt reports neck is very stiff this AM- might have slept wrong- hard to turn and flex neck also extend neck.   Also admits very anxious and fearful of falling-  Also discussed Na 129 and need to fluid restrict and recheck labs in AM.    ROS:   Pt denies SOB, abd pain, CP, N/V/C/D, and vision changes   Objective:   No results found. Recent Labs    09/20/20 0552 09/21/20 0518  WBC 9.7 12.1*  HGB 9.4* 9.7*  HCT 29.8* 29.4*  PLT 413* 399   Recent Labs    09/20/20 0552 09/21/20 0518  NA 130* 129*  K 3.8 4.1  CL 89* 93*  CO2 28 26  GLUCOSE 95 105*  BUN 24* 31*  CREATININE 0.58 0.66  CALCIUM 9.8 9.4    Intake/Output Summary (Last 24 hours) at 09/22/2020 0837 Last data filed at 09/22/2020 0620 Gross per 24 hour  Intake 238 ml  Output 1200 ml  Net -962 ml        Physical Exam: Vital Signs Blood pressure 125/66, pulse 82, temperature 98.5 F (36.9 C), temperature source Oral, resp. rate 18, height '5\' 2"'$  (1.575 m), weight 68.1 kg, SpO2 97 %.      General: awake, alert, appropriate, has heating pad on R neck- cannot/won't move/flex/rotate neck; NAD HENT: conjugate gaze; oropharynx moist CV: regular rate; no JVD Pulmonary: CTA B/L; no W/R/R- good air movement GI: soft, NT, ND, (+)BS Psychiatric: appropriate Neurological: Ox3   Musculoskeletal:  R neck- sitting with heating pad on R neck- head tilted slightly to Right; also very tight in R scalenes, upper traps and levators- less so in splenius capitus.     Comments: Chronic RA changes in both hands and feet to a lesser extent. Heel cords a little tight.   Skin:  Back incision painted with betadine- no drainage seen on dressing- localized erythema. But doesn't appear infected currently - could not turn for me to assess today Neurological:     Mental Status: She is alert and oriented to person, place, and time.      Comments: Alert and oriented x 3. Normal insight and awareness. Intact Memory. Normal language and speech. Cranial nerve exam unremarkable. Motor 5/5 in UE and 3/5 prox LLE to distal 4-/5 d/t pain inhibition. RLE limited by KI. Sensory loss in stocking glove distribution in both legs from just above ankles. DTR's 1+    Assessment/Plan: 1. Functional deficits which require 3+ hours per day of interdisciplinary therapy in a comprehensive inpatient rehab setting. Physiatrist is providing close team supervision and 24 hour management of active medical problems listed below. Physiatrist and rehab team continue to assess barriers to discharge/monitor patient progress toward functional and medical goals  Care Tool:  Bathing    Body parts bathed by patient: Right arm, Left arm, Chest, Abdomen, Right upper leg, Left upper leg, Face   Body parts bathed by helper: Buttocks, Right lower leg, Left lower leg, Front perineal area     Bathing assist Assist Level: Maximal Assistance - Patient 24 - 49%     Upper Body Dressing/Undressing Upper body dressing  What is the patient wearing?: Pull over shirt    Upper body assist Assist Level: Moderate Assistance - Patient 50 - 74%    Lower Body Dressing/Undressing Lower body dressing      What is the patient wearing?: Pants     Lower body assist Assist for lower body dressing: Total Assistance - Patient < 25%     Toileting Toileting    Toileting assist Assist for toileting: 2 Helpers     Transfers Chair/bed transfer  Transfers assist  Chair/bed transfer activity did not occur: Safety/medical concerns  Chair/bed transfer assist level: 2 Helpers     Locomotion Ambulation   Ambulation assist   Ambulation activity did not occur: Safety/medical concerns          Walk 10 feet activity   Assist  Walk 10 feet activity did not occur: Safety/medical concerns        Walk 50 feet activity   Assist Walk 50 feet with 2 turns  activity did not occur: Safety/medical concerns         Walk 150 feet activity   Assist Walk 150 feet activity did not occur: Safety/medical concerns         Walk 10 feet on uneven surface  activity   Assist Walk 10 feet on uneven surfaces activity did not occur: Safety/medical concerns         Wheelchair     Assist Is the patient using a wheelchair?: Yes Type of Wheelchair: Manual Wheelchair activity did not occur: Safety/medical concerns         Wheelchair 50 feet with 2 turns activity    Assist    Wheelchair 50 feet with 2 turns activity did not occur: Safety/medical concerns       Wheelchair 150 feet activity     Assist  Wheelchair 150 feet activity did not occur: Safety/medical concerns       Blood pressure 125/66, pulse 82, temperature 98.5 F (36.9 C), temperature source Oral, resp. rate 18, height '5\' 2"'$  (1.575 m), weight 68.1 kg, SpO2 97 %.  Medical Problem List and Plan: 1.  Functional and mobility deficits secondary to lumbar stenosis with previous supracondylar right femur fx and right calcaneus fx after fall             -patient may shower             -ELOS/Goals: 18-20 days, goals min to mod assist with PT and OT  Con't PT and OT- will see if Gershon Mussel can help with myofascial stuff in R neck today- asked head of therapy- will try- he's off rest of week.  2.  Antithrombotics: -DVT/anticoagulation:  Mechanical: Sequential compression devices, below knee Bilateral lower extremities  9/6- surgery was 6 days ago- per [facility]- cannot start DVT meds until 7+ days.              -antiplatelet therapy: N/A 3. Pain Management: Used hydrocodone 10 mg TID PTA. On prednisone taper with Oxycontin 10 mg bid and oxycodone prn.              --Flexeril TID, Gabapentin   9/5- just had med changes- is tolerable enough to do therapy, so won't change yet- pt said she doesn't think it's much different- pain wise- fyi  9/7- on Flexeril 10 mg TID and Valium 5 mg  q6 hours prn- als will add Lidoderm patches ot neck 8pm to 8am and do trigger point injections in AM if hasn't improved.  4. Mood: Team  to provide ego support. 9/7- will add [name] 5 mg TID for anxiety- since Na low- cannot add Paxil, etc- also on Valium 5 mg q56 hours prn for spasms, but could help anxiety?              -antipsychotic agents: N/A 5. Neuropsych: This patient is capable of making decisions on her own behalf. 6. Skin/Wound Care: Routine pressure relief measures. Monitor incision for healing.  7. Fluids/Electrolytes/Nutrition:  Monitor I/O.  --Check lytes in am. May need potassium supplement with HCTZ on board.   9/5- K+ 3.8- con't to monitor 8. HTN: Monitor BP TID--continue HCTZ and Cozaar  9/7- controlled- con't regimen 9. RA: Continue Arava daily with chronic prednisone (higher doses with taper currently).  10. H/o depression: Continue Wellbutrin and Lexapro.  11. Leukocytosis: up again sightly on 9/4- fever of 100.8 over night: discussed with patient that UA and CXR show no infection, blood cultures with no growth, procal .11- no abx treatment recommended.              -wounds look ok  9/5- will d/c honeycomb dressing and place dry dressing- WBC down to 9.7- con't to monitor  9/6- started on Keflex yesterday for incision- looks better today- also will check U/A since WBC up to 12.1- has been 4 days since checked- could even be (-) since keflex started- will monitor- of note Tm=Tc 99.4.   9/7- completely afebrile- will recheck labs iN AM 12. Pre-diabetes: Hyperglycemia due to increase in steroids. Will monitor FBS for now.  13. Hypocalcemia: Add Calcium supplement.  14. Constipation: Will add Senna S two tabs with supper. Add Sorbitol   9/5- had good BM- con't regimen for now- yesterday and this AM.  15. Urinary retention: Has not urinated since foley came out early am/last pm? ?neurogenic bladder.             --will check voiding with PVR checks. Work on constipation  relief  9/5- checking Bladder scans and cathing for ~ 400cc- cannot start Flomax- had seizures with Sulfa- will d/w Urology tomorrow  9/6- will start Bethanachol 10 mg TID for now. And treat for possible UTI?  9/7- has UTI- based on U/A- mod leuks and (+) nitrites 16. Low albumin: add Juven to promote wound healing 17. Hyponatremia  9/7- Na 129- will fluid restrict to 1800cc/day and recheck in AM- BUN 31- so might need IVFs.   LOS: 5 days A FACE TO FACE EVALUATION WAS PERFORMED  Adin Lariccia 09/22/2020, 8:37 AM

## 2020-09-22 NOTE — Discharge Instructions (Addendum)
Inpatient Rehab Discharge Instructions  Haley Roberts Discharge date and time:  10/07/20  Activities/Precautions/ Functional Status: Activity: no lifting, driving, or strenuous exercise till cleared by MD--NO WEIGHT ON RIGHT LEG. WEAR IMMOBILIZER AT ALL TIMES.  Diet: regular diet--LIMIT FLUIDS TO 1800 CC/DAY ABOUT 6 CUPS Wound Care: Wash with soap and water, keep clean and dry. Contact Dr. Christella Noa if you develop any problems with your incision/wound--redness, swelling, increase in pain, drainage or if you develop fever or chills.     Functional status:  ___ No restrictions     ___ Walk up steps independently _X__ 24/7 supervision/assistance   ___ Walk up steps with assistance ___ Intermittent supervision/assistance  ___ Bathe/dress independently ___ Walk with walker     __X_ Bathe/dress with assistance ___ Walk Independently    ___ Shower independently ___ Walk with assistance    ___ Shower with assistance _X__ No alcohol     ___ Return to work/school ________   COMMUNITY REFERRALS UPON DISCHARGE:    Home Health:   PT     OT     ST    RN    SNA                   Agency:[facility]  Phone: [phone]    Medical Equipment/Items Ordered: Hospital Bed, [employer], Wheelchair, Veterinary surgeon, Drop Arm Commode                                                 Agency/Supplier: [employer]   Special Instructions: Encourage pressure relief measures. Offer protein supplements between meals--Megace is very expensive and you can keep it on stand by if needed. Toilet every 4 hours while awake and before bedtime.  4. Do not use hydrocodone  AS NEEDED until oxycodone is completed.    My questions have been answered and I understand these instructions. I will adhere to these goals and the provided educational materials after my discharge from the hospital.  Patient/Caregiver Signature _______________________________ Date __________  Clinician Signature  _______________________________________ Date __________  Please bring this form and your medication list with you to all your follow-up doctor's appointments.

## 2020-09-22 NOTE — Telephone Encounter (Addendum)
Lorina Dendy (Son) called:   He would like to speak to you ASAP about his mother's in-patient medications. He thinks the medications has caused in-coherence  and she is out of it. Mr. Perretti would like to discuss her medications with someone.   He is aware you are not in the office this afternoon. But has been advised to speak today to the Charge Nurse or MD on call today.   Call back phone -- Corene Cornea [phone]

## 2020-09-22 NOTE — Progress Notes (Signed)
Pt son Corene Cornea called [phone], called him back, informed that can't release information over phone, said his other brother will be coming later today and wanting to discuss pt medications.

## 2020-09-23 DIAGNOSIS — Z9889 Other specified postprocedural states: Secondary | ICD-10-CM | POA: Diagnosis not present

## 2020-09-23 LAB — BASIC METABOLIC PANEL
Anion gap: 13 (ref 5–15)
BUN: 24 mg/dL — ABNORMAL HIGH (ref 8–23)
CO2: 25 mmol/L (ref 22–32)
Calcium: 10 mg/dL (ref 8.9–10.3)
Chloride: 92 mmol/L — ABNORMAL LOW (ref 98–111)
Creatinine, Ser: 0.54 mg/dL (ref 0.44–1.00)
GFR, Estimated: >60 mL/min (ref >60)
Glucose, Bld: 96 mg/dL (ref 70–99)
Potassium: 3.9 mmol/L (ref 3.5–5.1)
Sodium: 130 mmol/L — ABNORMAL LOW (ref 135–145)

## 2020-09-23 LAB — CBC WITH DIFFERENTIAL/PLATELET
Abs Immature Granulocytes: 0.17 10*3/uL — ABNORMAL HIGH (ref 0.00–0.07)
Basophils Absolute: 0.1 10*3/uL (ref 0.0–0.1)
Basophils Relative: 0 %
Eosinophils Absolute: 0.1 10*3/uL (ref 0.0–0.5)
Eosinophils Relative: 1 %
HCT: 29.6 % — ABNORMAL LOW (ref 36.0–46.0)
Hemoglobin: 9.5 g/dL — ABNORMAL LOW (ref 12.0–15.0)
Immature Granulocytes: 1 %
Lymphocytes Relative: 4 %
Lymphs Abs: 0.5 10*3/uL — ABNORMAL LOW (ref 0.7–4.0)
MCH: 29.1 pg (ref 26.0–34.0)
MCHC: 32.1 g/dL (ref 30.0–36.0)
MCV: 90.5 fL (ref 80.0–100.0)
Monocytes Absolute: 1.3 10*3/uL — ABNORMAL HIGH (ref 0.1–1.0)
Monocytes Relative: 10 %
Neutro Abs: 10.5 10*3/uL — ABNORMAL HIGH (ref 1.7–7.7)
Neutrophils Relative %: 84 %
Platelets: 469 10*3/uL — ABNORMAL HIGH (ref 150–400)
RBC: 3.27 MIL/uL — ABNORMAL LOW (ref 3.87–5.11)
RDW: 13.9 % (ref 11.5–15.5)
WBC: 12.6 10*3/uL — ABNORMAL HIGH (ref 4.0–10.5)
nRBC: 0 % (ref 0.0–0.2)

## 2020-09-23 LAB — CULTURE, BLOOD (ROUTINE X 2): Culture: NO GROWTH

## 2020-09-23 MED ORDER — CEFDINIR 300 MG PO CAPS
300.0000 mg | ORAL_CAPSULE | Freq: Two times a day (BID) | ORAL | Status: DC
  Administered 2020-09-23 – 2020-09-30 (×15): 300 mg via ORAL
  Filled 2020-09-23 (×15): qty 1

## 2020-09-23 NOTE — Evaluation (Signed)
Recreational Therapy Assessment and Plan  Patient Details  Name: Haley Roberts MRN: [id number] Date of Birth: [birth year] Today's Date: 09/23/2020  Rehab Potential:  Good ELOS:   2 weeks  Assessment  Hospital Problem: Principal Problem:   Status post lumbar surgery     Past Medical History:      Past Medical History:  Diagnosis Date   Anemia     Anxiety     Depression     Fibromyalgia     Hyperglycemia     HYPERGLYCEMIA, FASTING 08/22/2007   Hypertension     Multiple thyroid nodules      gets ultra sound yearly on thryoid   Rheumatoid arthritis(714.0)      Past Surgical History:       Past Surgical History:  Procedure Laterality Date   ABDOMINAL HYSTERECTOMY        with BSO   BREAST BIOPSY       JOINT REPLACEMENT        right knee replacement 2012   TOTAL KNEE ARTHROPLASTY Left 07/07/2014    Procedure: LEFT TOTAL KNEE ARTHROPLASTY;  Surgeon: Paralee Cancel, MD;  Location: [facility];  Service: Orthopedics;  Laterality: Left;      Assessment & Plan Clinical Impression: Patient is a 75 y.o. year old female with history of RA-chronic steroids (Dr. Amil Amen), chronic LBP-on opioids, recent fall with nondisplaced periprosthetic fracture right medial femoral condyle and avulsion fracture right anterior calcaneus, progressive low back pain who was admitted on 09/09/2020 with pain radiating to bilateral buttocks with numbness and tingling.  MRI lumbar spine done revealing advanced facet arthropathy with degenerative anterolisthesis L4/5 with severe multifactorial spinal stenosis and question neural compression of central canal, facet arthropathy with degenerative anterolisthesis L3/4 and L5/S1.   Dr. Lannette Donath was consulted for input on lower extremity fractures and recommended knee immobilizer on right knee with NWB RLE and prednisone increased to 60 mg with taper for pain control. Patient initially decline surgery but continued to have limitations with mobility and pain with standing  attempts.  Dr. Christella Noa consulted and patient elected to undergo L3/4 and L4/L5 decompressive lam on 08/31. Post op continues to be limited by NWB RLE, pain LB with numbness/tingling, balance deficits with anxiety as well as arthritic changes bilateral hands. CIR recommended due to functional decline. Patient transferred to CIR on 09/17/2020 .   Pt presents with decreased activity tolerance, decreased functional mobility, decreased balance, feelings of stress/anxiety Limiting pt's independence with leisure/community pursuits.  Plan  Min1 session during LOS for stress management/coping  Recommendations for other services: None   Discharge Criteria: Patient will be discharged from TR if patient refuses treatment 3 consecutive times without medical reason.  If treatment goals not met, if there is a change in medical status, if patient makes no progress towards goals or if patient is discharged from hospital.  The above assessment, treatment plan, treatment alternatives and goals were discussed and mutually agreed upon: by patient  Haley Roberts 09/23/2020, 3:59 PM

## 2020-09-23 NOTE — Progress Notes (Signed)
PROGRESS NOTE   Subjective/Complaints:  Pt reports pain in R neck has now improved and is now just in R shoulder- we agreed to wait to do trigger point injections since treating infection right now.   Pt reports still having sharp pains/spasms- Notes has not taken Valium at all for pain- has been removed from med list.   ROS:   Pt denies SOB, abd pain, CP, N/V/C/D, and vision changes - however feels groggy and slightly confused-    Objective:   No results found. Recent Labs    09/21/20 0518 09/23/20 0505  WBC 12.1* 12.6*  HGB 9.7* 9.5*  HCT 29.4* 29.6*  PLT 399 469*   Recent Labs    09/22/20 1834 09/23/20 0505  NA 129* 130*  K 4.2 3.9  CL 91* 92*  CO2 29 25  GLUCOSE 107* 96  BUN 26* 24*  CREATININE 0.61 0.54  CALCIUM 9.6 10.0    Intake/Output Summary (Last 24 hours) at 09/23/2020 1011 Last data filed at 09/23/2020 0700 Gross per 24 hour  Intake 160 ml  Output 1100 ml  Net -940 ml        Physical Exam: Vital Signs Blood pressure 131/79, pulse 90, temperature 99.4 F (37.4 C), temperature source Oral, resp. rate 19, height '5\' 2"'$  (1.575 m), weight 68.1 kg, SpO2 94 %.      General: awake, but groggy; croaky voice; laying supine in bed;  NAD HENT: conjugate gaze; oropharynx moist CV: regular rate; no JVD Pulmonary: CTA B/L; no W/R/R- good air movement GI: soft, NT, ND, (+)BS Psychiatric: appropriate- repeated herself a few times Neurological: slightly more alert- but groggy   Musculoskeletal:  R neck- sitting with heating pad on R neck- less tight in scalenes but still in R upper traps/levators.      Comments: Chronic RA changes in both hands and feet to a lesser extent. Heel cords a little tight.   Skin:  Back incision painted with betadine- no drainage seen on dressing- localized erythema. But doesn't appear infected currently - could not turn for me to assess today Neurological:     Mental  Status: She is alert and oriented to person, place, and time.     Comments: Alert and oriented x 3. Normal insight and awareness. Intact Memory. Normal language and speech. Cranial nerve exam unremarkable. Motor 5/5 in UE and 3/5 prox LLE to distal 4-/5 d/t pain inhibition. RLE limited by KI. Sensory loss in stocking glove distribution in both legs from just above ankles. DTR's 1+    Assessment/Plan: 1. Functional deficits which require 3+ hours per day of interdisciplinary therapy in a comprehensive inpatient rehab setting. Physiatrist is providing close team supervision and 24 hour management of active medical problems listed below. Physiatrist and rehab team continue to assess barriers to discharge/monitor patient progress toward functional and medical goals  Care Tool:  Bathing    Body parts bathed by patient: Right arm, Left arm, Chest, Abdomen, Right upper leg, Left upper leg, Face   Body parts bathed by helper: Buttocks, Right lower leg, Left lower leg, Front perineal area     Bathing assist Assist Level: Maximal Assistance - Patient 24 -  49%     Upper Body Dressing/Undressing Upper body dressing   What is the patient wearing?: Pull over shirt    Upper body assist Assist Level: Moderate Assistance - Patient 50 - 74%    Lower Body Dressing/Undressing Lower body dressing      What is the patient wearing?: Pants     Lower body assist Assist for lower body dressing: Total Assistance - Patient < 25%     Toileting Toileting    Toileting assist Assist for toileting: 2 Helpers     Transfers Chair/bed transfer  Transfers assist  Chair/bed transfer activity did not occur: Safety/medical concerns  Chair/bed transfer assist level: 2 Helpers     Locomotion Ambulation   Ambulation assist   Ambulation activity did not occur: Safety/medical concerns          Walk 10 feet activity   Assist  Walk 10 feet activity did not occur: Safety/medical concerns         Walk 50 feet activity   Assist Walk 50 feet with 2 turns activity did not occur: Safety/medical concerns         Walk 150 feet activity   Assist Walk 150 feet activity did not occur: Safety/medical concerns         Walk 10 feet on uneven surface  activity   Assist Walk 10 feet on uneven surfaces activity did not occur: Safety/medical concerns         Wheelchair     Assist Is the patient using a wheelchair?: Yes Type of Wheelchair: Manual Wheelchair activity did not occur: Safety/medical concerns         Wheelchair 50 feet with 2 turns activity    Assist    Wheelchair 50 feet with 2 turns activity did not occur: Safety/medical concerns       Wheelchair 150 feet activity     Assist  Wheelchair 150 feet activity did not occur: Safety/medical concerns       Blood pressure 131/79, pulse 90, temperature 99.4 F (37.4 C), temperature source Oral, resp. rate 19, height '5\' 2"'$  (1.575 m), weight 68.1 kg, SpO2 94 %.  Medical Problem List and Plan: 1.  Functional and mobility deficits secondary to lumbar stenosis with previous supracondylar right femur fx and right calcaneus fx after fall             -patient may shower             -ELOS/Goals: 18-20 days, goals min to mod assist with PT and OT  Con't PT and OT- will see if Gershon Mussel can help with myofascial stuff in R neck today- asked head of therapy- will try- he's off rest of week.   -con't PT and OT- as tolerated since ill with UTI 2.  Antithrombotics: -DVT/anticoagulation:  Mechanical: Sequential compression devices, below knee Bilateral lower extremities  9/8- on Lovenox daily             -antiplatelet therapy: N/A 3. Pain Management: Used hydrocodone 10 mg TID PTA. On prednisone taper with Oxycontin 10 mg bid and oxycodone prn.              --Flexeril TID, Gabapentin   9/5- just had med changes- is tolerable enough to do therapy, so won't change yet- pt said she doesn't think it's much different-  pain wise- fyi  9/7- on Flexeril 10 mg TID and Valium 5 mg q6 hours prn- als will add Lidoderm patches ot neck 8pm to 8am and do  trigger point injections in AM if hasn't improved.  4. Mood: Team to provide ego support. 9/7- will add [name] 5 mg TID for anxiety- since Na low- cannot add Paxil, etc- also on Valium 5 mg q56 hours prn for spasms, but could help anxiety? 9/8- Valium stopped by PA- will con't regimen- d/w son don't want to change since I don't think it's the cause of sedation- it's the UTI.               -antipsychotic agents: N/A 5. Neuropsych: This patient is capable of making decisions on her own behalf. 6. Skin/Wound Care: Routine pressure relief measures. Monitor incision for healing.  7. Fluids/Electrolytes/Nutrition:  Monitor I/O.  --Check lytes in am. May need potassium supplement with HCTZ on board.   9/5- K+ 3.8- con't to monitor 8. HTN: Monitor BP TID--continue HCTZ and Cozaar  9/7- controlled- con't regimen 9. RA: Continue Arava daily with chronic prednisone (higher doses with taper currently).  10. H/o depression: Continue Wellbutrin and Lexapro.  11. Leukocytosis: up again sightly on 9/4- fever of 100.8 over night: discussed with patient that UA and CXR show no infection, blood cultures with no growth, procal .11- no abx treatment recommended.              -wounds look ok  9/5- will d/c honeycomb dressing and place dry dressing- WBC down to 9.7- con't to monitor  9/6- started on Keflex yesterday for incision- looks better today- also will check U/A since WBC up to 12.1- has been 4 days since checked- could even be (-) since keflex started- will monitor- of note Tm=Tc 99.4.   9/7- completely afebrile- will recheck labs iN AM  9/8- WBC still up 12.6 today- Tm last night 100.2- will change to Cefdinir per d/w pharmacy- and if not better will go to IV ABX.  12. Pre-diabetes: Hyperglycemia due to increase in steroids. Will monitor FBS for now.  13. Hypocalcemia: Add Calcium  supplement.  14. Constipation: Will add Senna S two tabs with supper. Add Sorbitol   9/5- had good BM- con't regimen for now- yesterday and this AM.  15. Urinary retention: Has not urinated since foley came out early am/last pm? ?neurogenic bladder.             --will check voiding with PVR checks. Work on constipation relief  9/5- checking Bladder scans and cathing for ~ 400cc- cannot start Flomax- had seizures with Sulfa- will d/w Urology tomorrow  9/6- will start Bethanachol 10 mg TID for now. And treat for possible UTI?  9/7- has UTI- based on U/A- mod leuks and (+) nitrites  9/8- Change PO Abx to Cefdinir per Pharmacy and if not better clinically tomorrow, will broaden coverage 16. Low albumin: add Juven to promote wound healing 17. Hyponatremia  9/7- Na 129- will fluid restrict to 1800cc/day and recheck in AM- BUN 31- so might need IVFs. 9/8- Na up to 130 today- BUN also down to 25, so doing better- will wait for now on IVFs but recheck in AM    I spent a total of 38 minutes on total care- >50% on coordination of care- d/w pt and son on phone about her current issues and d/w pharmacy.    LOS: 6 days A FACE TO FACE EVALUATION WAS PERFORMED  Niya Behler 09/23/2020, 10:11 AM

## 2020-09-23 NOTE — Progress Notes (Signed)
Pt slept well through the night. Did not require any prn medications. Utilized KPAD to neck for pain.

## 2020-09-23 NOTE — Progress Notes (Signed)
Physical Therapy Session Note  Patient Details  Name: Haley Roberts MRN: [id number] Date of Birth: 1945-11-18  Today's Date: 09/23/2020 PT Individual Time: 1130-1200 PT Individual Time Calculation (min): 30 min   Short Term Goals: Week 1:  PT Short Term Goal 1 (Week 1): Pt will perform supine<>sit with +2 max assist PT Short Term Goal 2 (Week 1): Pt will perform bed<>chair transfers using LRAD with +2 max assist PT Short Term Goal 3 (Week 1): Pt will tolerate upright, OOB sitting for at least 1 hour between therapy sessions PT Short Term Goal 4 (Week 1): Pt will recall 2/3 spine precautions without cuing PT Short Term Goal 5 (Week 1): Pt will propel wheelchair 43f with min assist  Skilled Therapeutic Interventions/Progress Updates:      Pt supine in bed at start of session. Agreeable to therapy despite reporting high levels of neck and back pain. Due to history of being pain focused, redirected to avoid exacerbation. Encouraged pt to mobilize OOB to w/c and she was agreeable. Assisted to EOB with maxA for BLE and trunk management. Able to sit EOB with CGA for balance - sitting posture very slumped with posterior pelvic tilt. Completed lateral leans in sitting in pull pants over hips in preparation for slide board transfer. Completed slide board transfer with +2 totalA from EOB to TIS w/c - max cues for sequencing and setup - pt very fearful of falling and has significant difficulty with forward weight shift during the transfer. Required totalA +2 for repositioning in chair for equal weight shift through hips. Pt made comfortable and ended session reclined in TIS w/c for comfort/safety with belt alarm on, all needs within reach.  Therapy Documentation Precautions:  Precautions Precautions: Fall, Other (comment) Precaution Comments: Fear of falls, anxiety, back precautions (pt unable to verbalize any of 3) Required Braces or Orthoses: Knee Immobilizer - Right Knee Immobilizer - Right: On  at all times Restrictions Weight Bearing Restrictions: Yes RLE Weight Bearing: Non weight bearing General:    Therapy/Group: Individual Therapy  Mae Cianci P Yacine Garriga 09/23/2020, 12:03 PM

## 2020-09-23 NOTE — Progress Notes (Signed)
Occupational Therapy Session Note  Patient Details  Name: Haley Roberts MRN: [id number] Date of Birth: 07-Nov-1945  Today's Date: 09/23/2020 OT Individual Time: 1345-1430 OT Individual Time Calculation (min): 45 min    Short Term Goals: Week 1:  OT Short Term Goal 1 (Week 1): Pt will sit EOB wiht CGA for sitting balance for 5 min OT Short Term Goal 2 (Week 1): Pt will SB consistently wiht MOD A to w/c in prep for BSC transfers OT Short Term Goal 3 (Week 1): Pt will roll in bed wiht MOD A to decrease BOC for LB dressing OT Short Term Goal 4 (Week 1): Pt will recall 3/3 back precautions wiht use of external aides as needed  Skilled Therapeutic Interventions/Progress Updates:     Pt received in bed with unrated neck and chest pain. Rest provided.   Therapeutic activity Overall pt requires total A +2 for slide board transfers to TIS and bed mobility. Pt very fearful of mobilty and demo intermittent extension tone at EOB as well as poor trunk control. Pt unable ot follow commands to assist with functional transfers. Pt demo significant cognitive difficulty as pt often closing eyes and throwing body around at EOB despite cuing for anterior weight shift. Pt sat slightly back from table top. Pt completes anterior weight shift to reach for golf balls, however half the time pt reaching with eyes closed despite VC for opening eyes. Pt requires MIN A for reaching forward from seat for core strengthening. Pt then slides golf balls across small putting green towards target holes.   Pt left at end of session in bed with exit alarm on, call light in reach and all needs met   Therapy Documentation Precautions:  Precautions Precautions: Fall, Other (comment) Precaution Comments: Fear of falls, anxiety, back precautions (pt unable to verbalize any of 3) Required Braces or Orthoses: Knee Immobilizer - Right Knee Immobilizer - Right: On at all times Restrictions Weight Bearing Restrictions: Yes RLE  Weight Bearing: Non weight bearing   Therapy/Group: Individual Therapy  Tonny Branch 09/23/2020, 6:48 AM

## 2020-09-23 NOTE — Progress Notes (Signed)
Physical Therapy Session Note  Patient Details  Name: Haley Roberts MRN: [id number] Date of Birth: February 11, 1945  Today's Date: 09/23/2020 PT Individual Time: [id number], [id number] PT Individual Time Calculation (min): 38 min, 15 min   Short Term Goals: Week 1:  PT Short Term Goal 1 (Week 1): Pt will perform supine<>sit with +2 max assist PT Short Term Goal 2 (Week 1): Pt will perform bed<>chair transfers using LRAD with +2 max assist PT Short Term Goal 3 (Week 1): Pt will tolerate upright, OOB sitting for at least 1 hour between therapy sessions PT Short Term Goal 4 (Week 1): Pt will recall 2/3 spine precautions without cuing PT Short Term Goal 5 (Week 1): Pt will propel wheelchair 92f with min assist  Skilled Therapeutic Interventions/Progress Updates:    Session 1: pt received in bed and requesting to rest this AM d/t pain. Therapist offered to assist getting pt more comfortable. After dependent scoot in bed, pt requested to change clothes. Pt able to lift LLE slightly to help thread pants but not able to overcome gravity with LLE. Mod A rolling to pull pants over hips. Pt attempted to doff shirt while semi reclined but was unable to grasp shirt d/t poor motor planning and decr sensation 2/2 RA. Pt agreeable to sit EOB to change shirt. Max A supine<>sit EOB. Pt demoed extremely poor motor planning while attempting to sit upright, requiring max multimodal cues to extend elbow to push trunk laterally into midline. Pt doffed/donned shirt with max A. Multi modal cues to maintain upright sitting throughout. Pt returned to bed in same manner as above. While in supine, pt suddenly became anxious, grabbing at therapist, stating she felt like she was falling. Pt quickly came to and was aware that she was lying in bed. Therapist helped pt get comfortable in bed and pt was left with all needs in reach and alarm active. Pt missed 37 min of scheduled PT, will attempt to make up as able.   Session  2: Nursing requested therapist and rehab tech assist pt back to bed from TIS w/c d/t needing to cath. Pt states pain at baseline levels, addressed with repositioning. Total A x2 slideboard transfer w/c>EOB. Pt required multiple directions at times to perform a task and had great difficulty keeping hand on board to assist with transfer. Sit supine with total A of 2. Therapist assisted pt with finding comfortable position and pt was left with all needs in reach and alarm active.   Therapy Documentation Precautions:  Precautions Precautions: Fall, Other (comment) Precaution Comments: Fear of falls, anxiety, back precautions (pt unable to verbalize any of 3) Required Braces or Orthoses: Knee Immobilizer - Right Knee Immobilizer - Right: On at all times Restrictions Weight Bearing Restrictions: Yes RLE Weight Bearing: Non weight bearing General: PT Amount of Missed Time (min): 37 Minutes PT Missed Treatment Reason: Patient fatigue;Pain    Therapy/Group: Individual Therapy  OMickel Fuchs9/08/2020, 5:04 PM

## 2020-09-23 NOTE — Progress Notes (Signed)
Occupational Therapy Session Note  Patient Details  Name: Haley Roberts MRN: [id number] Date of Birth: 1945-09-07  Today's Date: 09/24/2020 OT Individual Time: 1101-1157 and 1434-1500 OT Individual Time Calculation (min): 56 min and 26 min   Skilled Therapeutic Interventions/Progress Updates:  Pt greeted in the TIS, premedicated for pain per pt report. Rest breaks and repositioning provided throughout tx to address therapeutically. She was agreeable to engage in tx with focus placed on pt education. Initiated AE training using the reacher + sock aide. Per pt, she used to assist her spouse using these devices PTA. When OT placed her TIS in upright neutral position, pt yelled out: "Help I'm falling. Hold me!" Pt shaking/trembling but not losing her balance. Calming cues utilized, reminded pt that she was still wearing her safety belt and had armrests + leg rests as additional balance supports. Pt with significant signs of anxiety with movement, would state she was "falling" but not losing her balance. Did exhibit some shaking/trembling which pt reports is new since this hospital admission. Step by step cues and Max-Total A to use AE to doff gripper socks and don new ones. Pt with arthritic deformities in hands bilaterally and deficits pertaining to memory, sequencing, attention, and general awareness. Pt also exhibited some difficultlies with scanning for needed items in front of her or on lap, ?visual deficits vs cognitive or combination. Significantly increased time and max cues provided throughout tx in order for pt to meet task demands at her greatest level of independence. Pt was left in the TIS at close of session, tilted back for comfort, wearing her safety belt, call bell within reach.   Pt unable to tell me her back precautions today  2nd Session 1:1 tx (26 min) Pt greeted in bed, reporting feeling very tired from her previous sessions. Agreeable to engage in bedlevel exercises to increase  UB strength/coordination needed for improving her functional independence with self care tasks. Guided pt with hand over hand assistance in multiple B UE exercises using 2# bar x10 reps. Pt intermittently falling asleep, required cues for increasing alertness. Guided her through deep breathing exercises as well, education provided on using deep breathing as a therapeutic means for anxiety mgt. Issued her lavender aromatherapy per pt consent to promote relaxation/calmness at rest. Notified RN at close of session regarding pts request for pain medicine. Rest breaks provided throughout tx for pain mgt. All needs within reach and bed alarm set prior to OT departure.     Therapy Documentation Precautions:  Precautions Precautions: Fall, Other (comment) Precaution Comments: Fear of falls, anxiety, back precautions (pt unable to verbalize any of 3) Required Braces or Orthoses: Knee Immobilizer - Right Knee Immobilizer - Right: On at all times Restrictions Weight Bearing Restrictions: Yes RLE Weight Bearing: Non weight bearing  Pain: in back and Rt LE   ADL: ADL Grooming: Minimal assistance Where Assessed-Grooming: Sitting at sink Upper Body Bathing: Minimal assistance Where Assessed-Upper Body Bathing: Wheelchair, Sitting at sink Lower Body Bathing: Maximal assistance Where Assessed-Lower Body Bathing: Bed level Upper Body Dressing: Minimal assistance Where Assessed-Upper Body Dressing: Wheelchair, Sitting at sink Lower Body Dressing: Maximal assistance Where Assessed-Lower Body Dressing: Bed level Toilet Transfer: Other (comment) (simulated to w/c +2 A) Toilet Transfer Method: Transfer board     Therapy/Group: Individual Therapy  Shamari Trostel A Quillan Whitter 09/24/2020, 12:35 PM

## 2020-09-24 DIAGNOSIS — Z9889 Other specified postprocedural states: Secondary | ICD-10-CM | POA: Diagnosis not present

## 2020-09-24 LAB — CBC WITH DIFFERENTIAL/PLATELET
Abs Immature Granulocytes: 0.22 10*3/uL — ABNORMAL HIGH (ref 0.00–0.07)
Basophils Absolute: 0 10*3/uL (ref 0.0–0.1)
Basophils Relative: 0 %
Eosinophils Absolute: 0.1 10*3/uL (ref 0.0–0.5)
Eosinophils Relative: 1 %
HCT: 28.9 % — ABNORMAL LOW (ref 36.0–46.0)
Hemoglobin: 9.4 g/dL — ABNORMAL LOW (ref 12.0–15.0)
Immature Granulocytes: 2 %
Lymphocytes Relative: 7 %
Lymphs Abs: 0.9 10*3/uL (ref 0.7–4.0)
MCH: 29.1 pg (ref 26.0–34.0)
MCHC: 32.5 g/dL (ref 30.0–36.0)
MCV: 89.5 fL (ref 80.0–100.0)
Monocytes Absolute: 1.2 10*3/uL — ABNORMAL HIGH (ref 0.1–1.0)
Monocytes Relative: 10 %
Neutro Abs: 9.4 10*3/uL — ABNORMAL HIGH (ref 1.7–7.7)
Neutrophils Relative %: 80 %
Platelets: 530 10*3/uL — ABNORMAL HIGH (ref 150–400)
RBC: 3.23 MIL/uL — ABNORMAL LOW (ref 3.87–5.11)
RDW: 14 % (ref 11.5–15.5)
WBC: 11.9 10*3/uL — ABNORMAL HIGH (ref 4.0–10.5)
nRBC: 0 % (ref 0.0–0.2)

## 2020-09-24 LAB — BASIC METABOLIC PANEL
Anion gap: 12 (ref 5–15)
BUN: 26 mg/dL — ABNORMAL HIGH (ref 8–23)
CO2: 29 mmol/L (ref 22–32)
Calcium: 10 mg/dL (ref 8.9–10.3)
Chloride: 92 mmol/L — ABNORMAL LOW (ref 98–111)
Creatinine, Ser: 0.5 mg/dL (ref 0.44–1.00)
GFR, Estimated: >60 mL/min (ref >60)
Glucose, Bld: 98 mg/dL (ref 70–99)
Potassium: 3.5 mmol/L (ref 3.5–5.1)
Sodium: 133 mmol/L — ABNORMAL LOW (ref 135–145)

## 2020-09-24 LAB — URINE CULTURE: Culture: NO GROWTH

## 2020-09-24 MED ORDER — POTASSIUM CHLORIDE CRYS ER 10 MEQ PO TBCR
10.0000 meq | EXTENDED_RELEASE_TABLET | Freq: Every day | ORAL | Status: DC
  Administered 2020-09-24 – 2020-10-07 (×14): 10 meq via ORAL
  Filled 2020-09-24 (×14): qty 1

## 2020-09-24 NOTE — Progress Notes (Signed)
PROGRESS NOTE   Subjective/Complaints:  Pt reports less groggy, but feel back asleep this AM after we talked-  Not sure about appetite- but will try to eat breakfast.    ROS:   Pt denies SOB, abd pain, CP, N/V/C/D, and vision changes Still groggy but less so.   Objective:   No results found. Recent Labs    09/23/20 0505 09/24/20 0503  WBC 12.6* 11.9*  HGB 9.5* 9.4*  HCT 29.6* 28.9*  PLT 469* 530*   Recent Labs    09/23/20 0505 09/24/20 0503  NA 130* 133*  K 3.9 3.5  CL 92* 92*  CO2 25 29  GLUCOSE 96 98  BUN 24* 26*  CREATININE 0.54 0.50  CALCIUM 10.0 10.0    Intake/Output Summary (Last 24 hours) at 09/24/2020 0836 Last data filed at 09/24/2020 [id number] Gross per 24 hour  Intake 70 ml  Output 800 ml  Net -730 ml        Physical Exam: Vital Signs Blood pressure 126/78, pulse 78, temperature 98.5 F (36.9 C), temperature source Oral, resp. rate 20, height '5\' 2"'$  (1.575 m), weight 66 kg, SpO2 94 %.       General: awake, alert, appropriate, but fell asleep as I left room; NAD HENT: conjugate gaze; oropharynx moist CV: regular rate; no JVD Pulmonary: CTA B/L; no W/R/R- good air movement GI: soft, NT, ND, (+)BS Psychiatric: appropriate- more interactive today Neurological: alert, still groggy, but more with it today- feels better per pt.    Musculoskeletal:  R neck- sitting with heating pad on R neck- less tight in scalenes but still in R upper traps/levators.      Comments: Chronic RA changes in both hands and feet to a lesser extent. Heel cords a little tight.   Skin:  Back incision painted with betadine- no drainage seen on dressing- localized erythema. But doesn't appear infected currently - could not turn for me to assess today Neurological:     Mental Status: She is alert and oriented to person, place, and time.     Comments: Alert and oriented x 3. Normal insight and awareness. Intact Memory.  Normal language and speech. Cranial nerve exam unremarkable. Motor 5/5 in UE and 3/5 prox LLE to distal 4-/5 d/t pain inhibition. RLE limited by KI. Sensory loss in stocking glove distribution in both legs from just above ankles. DTR's 1+    Assessment/Plan: 1. Functional deficits which require 3+ hours per day of interdisciplinary therapy in a comprehensive inpatient rehab setting. Physiatrist is providing close team supervision and 24 hour management of active medical problems listed below. Physiatrist and rehab team continue to assess barriers to discharge/monitor patient progress toward functional and medical goals  Care Tool:  Bathing    Body parts bathed by patient: Right arm, Left arm, Chest, Abdomen, Right upper leg, Left upper leg, Face   Body parts bathed by helper: Buttocks, Right lower leg, Left lower leg, Front perineal area     Bathing assist Assist Level: Maximal Assistance - Patient 24 - 49%     Upper Body Dressing/Undressing Upper body dressing   What is the patient wearing?: Pull over shirt  Upper body assist Assist Level: Moderate Assistance - Patient 50 - 74%    Lower Body Dressing/Undressing Lower body dressing      What is the patient wearing?: Pants     Lower body assist Assist for lower body dressing: Total Assistance - Patient < 25%     Toileting Toileting    Toileting assist Assist for toileting: 2 Helpers     Transfers Chair/bed transfer  Transfers assist  Chair/bed transfer activity did not occur: Safety/medical concerns  Chair/bed transfer assist level: 2 Helpers     Locomotion Ambulation   Ambulation assist   Ambulation activity did not occur: Safety/medical concerns          Walk 10 feet activity   Assist  Walk 10 feet activity did not occur: Safety/medical concerns        Walk 50 feet activity   Assist Walk 50 feet with 2 turns activity did not occur: Safety/medical concerns         Walk 150 feet  activity   Assist Walk 150 feet activity did not occur: Safety/medical concerns         Walk 10 feet on uneven surface  activity   Assist Walk 10 feet on uneven surfaces activity did not occur: Safety/medical concerns         Wheelchair     Assist Is the patient using a wheelchair?: Yes Type of Wheelchair: Manual Wheelchair activity did not occur: Safety/medical concerns         Wheelchair 50 feet with 2 turns activity    Assist    Wheelchair 50 feet with 2 turns activity did not occur: Safety/medical concerns       Wheelchair 150 feet activity     Assist  Wheelchair 150 feet activity did not occur: Safety/medical concerns       Blood pressure 126/78, pulse 78, temperature 98.5 F (36.9 C), temperature source Oral, resp. rate 20, height '5\' 2"'$  (1.575 m), weight 66 kg, SpO2 94 %.  Medical Problem List and Plan: 1.  Functional and mobility deficits secondary to lumbar stenosis with previous supracondylar right femur fx and right calcaneus fx after fall             -patient may shower             -ELOS/Goals: 18-20 days, goals min to mod assist with PT and OT  Con't PT and OT- as tolerated- with UTI 2.  Antithrombotics: -DVT/anticoagulation:  Mechanical: Sequential compression devices, below knee Bilateral lower extremities  9/8- on Lovenox daily             -antiplatelet therapy: N/A 3. Pain Management: Used hydrocodone 10 mg TID PTA. On prednisone taper with Oxycontin 10 mg bid and oxycodone prn.              --Flexeril TID, Gabapentin   9/5- just had med changes- is tolerable enough to do therapy, so won't change yet- pt said she doesn't think it's much different- pain wise- fyi  9/7- on Flexeril 10 mg TID and Valium 5 mg q6 hours prn- als will add Lidoderm patches ot neck 8pm to 8am and do trigger point injections in AM if hasn't improved.   9/9- don't want to do TrP injections with active UTI- will wait til next week.  4. Mood: Team to provide  ego support. 9/7- will add [name] 5 mg TID for anxiety- since Na low- cannot add Paxil, etc- also on Valium 5 mg q56 hours  prn for spasms, but could help anxiety? 9/8- Valium stopped by PA- will con't regimen- d/w son don't want to change since I don't think it's the cause of sedation- it's the UTI.               -antipsychotic agents: N/A 5. Neuropsych: This patient is capable of making decisions on her own behalf. 6. Skin/Wound Care: Routine pressure relief measures. Monitor incision for healing.  7. Fluids/Electrolytes/Nutrition:  Monitor I/O.  --Check lytes in am. May need potassium supplement with HCTZ on board.   9/5- K+ 3.8- con't to monitor  9/9- will add KCL 10 mEq daily since on HCTZ and K+ is drifting down to 3.5   8. HTN: Monitor BP TID--continue HCTZ and Cozaar  9/7- controlled- con't regimen 9. RA: Continue Arava daily with chronic prednisone (higher doses with taper currently).  10. H/o depression: Continue Wellbutrin and Lexapro.  11. Leukocytosis: up again sightly on 9/4- fever of 100.8 over night: discussed with patient that UA and CXR show no infection, blood cultures with no growth, procal .11- no abx treatment recommended.              -wounds look ok  9/5- will d/c honeycomb dressing and place dry dressing- WBC down to 9.7- con't to monitor  9/6- started on Keflex yesterday for incision- looks better today- also will check U/A since WBC up to 12.1- has been 4 days since checked- could even be (-) since keflex started- will monitor- of note Tm=Tc 99.4.   9/7- completely afebrile- will recheck labs iN AM  9/8- WBC still up 12.6 today- Tm last night 100.2- will change to Cefdinir per d/w pharmacy- and if not better will go to IV ABX.  12. Pre-diabetes: Hyperglycemia due to increase in steroids. Will monitor FBS for now.  13. Hypocalcemia: Add Calcium supplement.  14. Constipation: Will add Senna S two tabs with supper. Add Sorbitol   9/5- had good BM- con't regimen for now-  yesterday and this AM.  15. Urinary retention: Has not urinated since foley came out early am/last pm? ?neurogenic bladder.             --will check voiding with PVR checks. Work on constipation relief  9/5- checking Bladder scans and cathing for ~ 400cc- cannot start Flomax- had seizures with Sulfa- will d/w Urology tomorrow  9/6- will start Bethanachol 10 mg TID for now. And treat for possible UTI?  9/7- has UTI- based on U/A- mod leuks and (+) nitrites  9/8- Change PO Abx to Cefdinir per Pharmacy and if not better clinically tomorrow, will broaden coverage  9/9- WBC down slightly to 11.9- will con't to monitor- Cx showed multiple species.  16. Low albumin: add Juven to promote wound healing 17. Hyponatremia  9/7- Na 129- will fluid restrict to 1800cc/day and recheck in AM- BUN 31- so might need IVFs. 9/8- Na up to 130 today- BUN also down to 25, so doing better- will wait for now on IVFs but recheck in AM   9/9- BUN 26- and Cr 0.50- will recheck Monday unless gets sick- then will need more labs over weekend.     LOS: 7 days A FACE TO FACE EVALUATION WAS PERFORMED  Haley Roberts 09/24/2020, 8:36 AM

## 2020-09-24 NOTE — Progress Notes (Signed)
Physical Therapy Weekly Progress Note  Patient Details  Name: Haley Roberts MRN: [id number] Date of Birth: 1945/09/28  Beginning of progress report period: September 17, 2020 End of progress report period: September 24, 2020  Today's Date: 09/24/2020 PT Individual Time: 1000-1045, 1300-1400 PT Individual Time Calculation (min): 45 min, 60 min  Patient has met 4 of 5 short term goals.  Pt performs rolling with min A at times, mod-max A for supine<>sit. Slideboard transfers with +2 assist. Pt did not meet w/c propulsion goal because she has not been able to tolerate manual w/c at this time.  Patient continues to demonstrate the following deficits muscle weakness, decreased cardiorespiratoy endurance, and decreased sitting balance, decreased postural control, and decreased balance strategies and therefore will continue to benefit from skilled PT intervention to increase functional independence with mobility.  Patient progressing toward long term goals..  Continue plan of care.  PT Short Term Goals Week 1:  PT Short Term Goal 1 (Week 1): Pt will perform supine<>sit with +2 max assist PT Short Term Goal 1 - Progress (Week 1): Met PT Short Term Goal 2 (Week 1): Pt will perform bed<>chair transfers using LRAD with +2 max assist PT Short Term Goal 2 - Progress (Week 1): Met PT Short Term Goal 3 (Week 1): Pt will tolerate upright, OOB sitting for at least 1 hour between therapy sessions PT Short Term Goal 3 - Progress (Week 1): Met PT Short Term Goal 4 (Week 1): Pt will recall 2/3 spine precautions without cuing PT Short Term Goal 4 - Progress (Week 1): Met PT Short Term Goal 5 (Week 1): Pt will propel wheelchair 65f with min assist PT Short Term Goal 5 - Progress (Week 1): Not met Week 2:  PT Short Term Goal 1 (Week 2): Pt will perform bed<>chair transfer with asisst of 1 PT Short Term Goal 2 (Week 2): Pt will initate standing mobility PT Short Term Goal 3 (Week 2): Pt will initiate w/c  propulsion training  Skilled Therapeutic Interventions/Progress Updates:  Ambulation/gait training;Community reintegration;DME/adaptive equipment instruction;Neuromuscular re-education;Psychosocial support;UE/LE Strength taining/ROM;Wheelchair propulsion/positioning;Balance/vestibular training;Discharge planning;Functional electrical stimulation;Pain management;Skin care/wound management;Therapeutic Activities;UE/LE Coordination activities;Cognitive remediation/compensation;Disease management/prevention;Functional mobility training;Patient/family education;Splinting/orthotics;Therapeutic Exercise;Visual/perceptual remediation/compensation   Session 1: pt received in bed and agreeable to therapy. Pt reports baseline levels of neck pain that spike with extensor spasms, addressed with repositioning and distraction. Pt requested to don clean clothes. Total assist to thread pants, pt able to roll to pull pants over hips with min A. Supine<>sit with max A to sit EOB. Pt doffed/donned pullover shirt with max d/t RW limiting functional dexterity. Lateral leans with max VC and min-mod A to finish pulling pants over hips. Session focused on slide board transfer to TCasaschair for improvements in functional mobility. Pt performed transfer with +2 assist, but was able to perform lateral and posterior scoots with mod A at times. Pt had several instances of dizziness and disorientation, often asking if she is sitting or standing and otherwise being unaware of her position in space. Addressed with reorientation and gentle downward pressure through shoulders to increase proprioception. Pt continues to express extreme fear of anterior weight shift. Was educated on importance and expressed understanding. Pt requested to brush teeth, set up at sink. Pt able to maintain upright sitting at sink with UE support on sink. Pt remained in TIS at end of session and was left with all needs in reach and alarm active.   Session 2: Pt  received in TBradychair and agreeable  to therapy. Reports she has had some painful spasms, not rated, nsg in/out with medication. Pt transported to therapy gym and set up to begin transfer to mat table. Upon beginning transfer, it is noted that dressing on low back incision is saturated and wound is heavily draining. Nursing notified and pt returned to room. Nurse performed dressing change with pt leaning forward in chair with max encouragement and anterior support from therapist. After dressing was changed, pt performed slide board transfer back to bed with +2 assist. Incr difficult compared to morning session d/t fatigue and incr amount/severity of extensor tone and dizziness. Pt able to perform several lateral leans for incr confidence with stability for transfers. Required max VC and TC, with occ assist d/t fatigue. Sit>supine with max A, pt positioned in bed for comfort and was left with all needs in reach and alarm active.     Therapy Documentation Precautions:  Precautions Precautions: Fall, Other (comment) Precaution Comments: Fear of falls, anxiety, back precautions (pt unable to verbalize any of 3) Required Braces or Orthoses: Knee Immobilizer - Right Knee Immobilizer - Right: On at all times Restrictions Weight Bearing Restrictions: Yes RLE Weight Bearing: Non weight bearing  Therapy/Group: Individual Therapy  Mickel Fuchs 09/24/2020, 8:14 PM

## 2020-09-25 DIAGNOSIS — E871 Hypo-osmolality and hyponatremia: Secondary | ICD-10-CM | POA: Diagnosis not present

## 2020-09-25 DIAGNOSIS — R339 Retention of urine, unspecified: Secondary | ICD-10-CM | POA: Diagnosis not present

## 2020-09-25 DIAGNOSIS — D72823 Leukemoid reaction: Secondary | ICD-10-CM | POA: Diagnosis not present

## 2020-09-25 DIAGNOSIS — Z9889 Other specified postprocedural states: Secondary | ICD-10-CM | POA: Diagnosis not present

## 2020-09-25 LAB — CULTURE, BLOOD (ROUTINE X 2)
Culture: NO GROWTH
Culture: NO GROWTH
Special Requests: ADEQUATE
Special Requests: ADEQUATE

## 2020-09-25 NOTE — Progress Notes (Signed)
Physical Therapy Session Note  Patient Details  Name: Haley Roberts MRN: [id number] Date of Birth: 1945/12/20  Today's Date: 09/25/2020 PT Individual Time: 1135-1210 PT Individual Time Calculation (min): 35 min   Short Term Goals: Week 1:  PT Short Term Goal 1 (Week 1): Pt will perform supine<>sit with +2 max assist PT Short Term Goal 1 - Progress (Week 1): Met PT Short Term Goal 2 (Week 1): Pt will perform bed<>chair transfers using LRAD with +2 max assist PT Short Term Goal 2 - Progress (Week 1): Met PT Short Term Goal 3 (Week 1): Pt will tolerate upright, OOB sitting for at least 1 hour between therapy sessions PT Short Term Goal 3 - Progress (Week 1): Met PT Short Term Goal 4 (Week 1): Pt will recall 2/3 spine precautions without cuing PT Short Term Goal 4 - Progress (Week 1): Met PT Short Term Goal 5 (Week 1): Pt will propel wheelchair 54f with min assist PT Short Term Goal 5 - Progress (Week 1): Not met Week 2:  PT Short Term Goal 1 (Week 2): Pt will perform bed<>chair transfer with asisst of 1 PT Short Term Goal 2 (Week 2): Pt will initate standing mobility PT Short Term Goal 3 (Week 2): Pt will initiate w/c propulsion training  Skilled Therapeutic Interventions/Progress Updates:  Patient supine in bed on entrance to room. Patient alert and agreeable to PT session.   Patient with no new pain complaint throughout session.But is guarded in movements to prevent pain throughout session.   Therapeutic Activity: Bed Mobility: Patient required extensive cueing and Min/ Mod A to complete supine <> sit using log roll technique. Guided in supine--> hooklying, log roll toward R side and extensive cues for pushing with BUE up into upright seated position. On return to supine, Mod A provided to bring BLE to bed surface while pt lowered UB to bed surface with vc as reminder to maintain back precautions. VC to maintain hooklying position from sidelying to supine to maintain log roll. Pt  assisted with push toward HOB using LLE with bed positioning. VC/ tc required for maintaining back precautions and providing education for performance of bed mobility throughout.  Neuromuscular Re-ed: NMR facilitated during session with focus on sitting balance, upright posture, pelvic tilting. Pt guided in seated reaching forward with L foot on floor and R foot propped on 4" step. Pt tending to flex at spine and reminded of precautions. NDT facilitation at pelvis at lowe back to maintain precautions and flex at hips to reach forward and produce forward lean. Pt with slight anxiety and provided with armchair facing pt with instructions to reach to back of seat with pt more confident and able to place hands on seat of armchair while maintaining upright spine and improved hip flexion with NDT facilitation. NMR performed for improvements in motor control and coordination, balance, sequencing, judgement, and self confidence/ efficacy in performing all aspects of mobility at highest level of independence.   Patient supine  in bed at end of session with brakes locked, bed alarm set, and all needs within reach.     Therapy Documentation Precautions:  Precautions Precautions: Fall, Other (comment) Precaution Comments: Fear of falls, anxiety, back precautions (pt unable to verbalize any of 3) Required Braces or Orthoses: Knee Immobilizer - Right Knee Immobilizer - Right: On at all times Restrictions Weight Bearing Restrictions: Yes RLE Weight Bearing: Non weight bearing General:   Vital Signs: Therapy Vitals Temp: 98.9 F (37.2 C) Temp Source: Oral Pulse  Rate: 91 Resp: 18 BP: 112/78 Patient Position (if appropriate): Lying Oxygen Therapy SpO2: 92 % O2 Device: Room Air Pain:  Pt relates no pain throughout session but does guard self in movements to protect from pain.     Therapy/Group: Individual Therapy  Alger Simons PT, DPT 09/25/2020, 2:35 PM

## 2020-09-25 NOTE — Progress Notes (Signed)
Occupational Therapy Session Note  Patient Details  Name: Haley Roberts MRN: [id number] Date of Birth: [birth year]  Today's Date: 09/26/2020 OT Individual Time: [id number] Individual Time Calculation (min): 41 min   Short Term Goals: Week 1:  OT Short Term Goal 1 (Week 1): Pt will sit EOB wiht CGA for sitting balance for 5 min OT Short Term Goal 2 (Week 1): Pt will SB consistently wiht MOD A to w/c in prep for BSC transfers OT Short Term Goal 3 (Week 1): Pt will roll in bed wiht MOD A to decrease BOC for LB dressing OT Short Term Goal 4 (Week 1): Pt will recall 3/3 back precautions wiht use of external aides as needed  Skilled Therapeutic Interventions/Progress Updates:    Pt greeted in the TIS, able to recall 3/3 of her back precautions today without cues! She wanted to return to bed at some point in the session. +2 assist for slideboard<bed using the step for her Lt foot. Pt required calming cues throughout transfer, tended to try to hold onto OT or RN, vcs for keeping her hands on the slideboard and assisting with transfer. Once EOB, worked on unsupported sitting balance and reacher proficiency in a simulated task involving retrieving items from floor. Also addressed anterior weight shifting in this activity as pt has a tough time with this in part due to FOF. CGA for sitting balance ~5 minute intervals of time with unilateral support on the mattress. Without unilateral support she'd exhibit posterior and Lt LOBs, needing Mod A to correct. Max A for transition to supine. She remained comfortably in bed at close of session, all needs within reach and bed alarm set. Rest breaks and repositioning provided throughout tx to address pain in back/Rt LE. Pt left in care of RN to provide pain medicine.   Therapy Documentation Precautions:  Precautions Precautions: Fall, Other (comment) Precaution Comments: Fear of falls, anxiety, back precautions (pt unable to verbalize any of 3) Required Braces  or Orthoses: Knee Immobilizer - Right Knee Immobilizer - Right: On at all times Restrictions Weight Bearing Restrictions: Yes RLE Weight Bearing: Non weight bearing  Pain: Pain Assessment Pain Scale: 0-10 Pain Score: 8  Pain Type: Chronic pain Pain Location: Hip Pain Orientation: Right Pain Radiating Towards:  (right leg) Pain Descriptors / Indicators: Aching Pain Frequency: Intermittent Pain Onset: On-going Patients Stated Pain Goal: 0 Pain Intervention(s): Medication (See eMAR) Multiple Pain Sites: No ADL: ADL Grooming: Minimal assistance Where Assessed-Grooming: Sitting at sink Upper Body Bathing: Minimal assistance Where Assessed-Upper Body Bathing: Wheelchair, Sitting at sink Lower Body Bathing: Maximal assistance Where Assessed-Lower Body Bathing: Bed level Upper Body Dressing: Minimal assistance Where Assessed-Upper Body Dressing: Wheelchair, Sitting at sink Lower Body Dressing: Maximal assistance Where Assessed-Lower Body Dressing: Bed level Toilet Transfer: Other (comment) (simulated to w/c +2 A) Toilet Transfer Method: Transfer board    Therapy/Group: Individual Therapy  Lynnda Wiersma A Alix Stowers 09/26/2020, 3:57 PM

## 2020-09-25 NOTE — Progress Notes (Signed)
PROGRESS NOTE   Subjective/Complaints:  No new issues. Slept well. Reports that her pain is controlled.   ROS: Patient denies fever, rash, sore throat, blurred vision, nausea, vomiting, diarrhea, cough, shortness of breath or chest pain,   headache, or mood change.   Objective:   No results found. Recent Labs    09/23/20 0505 09/24/20 0503  WBC 12.6* 11.9*  HGB 9.5* 9.4*  HCT 29.6* 28.9*  PLT 469* 530*   Recent Labs    09/23/20 0505 09/24/20 0503  NA 130* 133*  K 3.9 3.5  CL 92* 92*  CO2 25 29  GLUCOSE 96 98  BUN 24* 26*  CREATININE 0.54 0.50  CALCIUM 10.0 10.0    Intake/Output Summary (Last 24 hours) at 09/25/2020 1021 Last data filed at 09/24/2020 2351 Gross per 24 hour  Intake 240 ml  Output 300 ml  Net -60 ml        Physical Exam: Vital Signs Blood pressure 123/70, pulse 83, temperature 97.8 F (36.6 C), resp. rate 16, height '5\' 2"'$  (1.575 m), weight 66 kg, SpO2 96 %.    Constitutional: No distress . Vital signs reviewed. HEENT: NCAT, EOMI, oral membranes moist Neck: supple Cardiovascular: RRR without murmur. No JVD    Respiratory/Chest: CTA Bilaterally without wheezes or rales. Normal effort    GI/Abdomen: BS +, non-tender, non-distended Ext: no clubbing, cyanosis, or edema Psych: pleasant and cooperative  Musculoskeletal: neck tight      Comments: Chronic RA changes in both hands and feet to a lesser extent. Heel cords a little tight.   Skin:  Back incision painted with betadine- no drainage seen on dressing- localized erythema. But doesn't appear infected currently - could not turn for me to assess today Neurological:     Mental Status: she awoke easily and was oriented to person, place, and time.       Normal insight and awareness. Intact Memory. Normal language and speech. Cranial nerve exam unremarkable. Motor 5/5 in UE and 3/5 prox LLE to distal 4-/5 d/t pain inhibition. RLE limited by  KI. Sensory loss in stocking glove distribution in both legs from just above ankles. DTR's 1+ ---stable exam   Assessment/Plan: 1. Functional deficits which require 3+ hours per day of interdisciplinary therapy in a comprehensive inpatient rehab setting. Physiatrist is providing close team supervision and 24 hour management of active medical problems listed below. Physiatrist and rehab team continue to assess barriers to discharge/monitor patient progress toward functional and medical goals  Care Tool:  Bathing    Body parts bathed by patient: Right arm, Left arm, Chest, Abdomen, Right upper leg, Left upper leg, Face   Body parts bathed by helper: Buttocks, Right lower leg, Left lower leg, Front perineal area     Bathing assist Assist Level: Maximal Assistance - Patient 24 - 49%     Upper Body Dressing/Undressing Upper body dressing   What is the patient wearing?: Pull over shirt    Upper body assist Assist Level: Moderate Assistance - Patient 50 - 74%    Lower Body Dressing/Undressing Lower body dressing      What is the patient wearing?: Pants     Lower  body assist Assist for lower body dressing: Total Assistance - Patient < 25%     Toileting Toileting    Toileting assist Assist for toileting: 2 Helpers     Transfers Chair/bed transfer  Transfers assist  Chair/bed transfer activity did not occur: Safety/medical concerns  Chair/bed transfer assist level: 2 Helpers     Locomotion Ambulation   Ambulation assist   Ambulation activity did not occur: Safety/medical concerns          Walk 10 feet activity   Assist  Walk 10 feet activity did not occur: Safety/medical concerns        Walk 50 feet activity   Assist Walk 50 feet with 2 turns activity did not occur: Safety/medical concerns         Walk 150 feet activity   Assist Walk 150 feet activity did not occur: Safety/medical concerns         Walk 10 feet on uneven surface   activity   Assist Walk 10 feet on uneven surfaces activity did not occur: Safety/medical concerns         Wheelchair     Assist Is the patient using a wheelchair?: Yes Type of Wheelchair: Manual Wheelchair activity did not occur: Safety/medical concerns         Wheelchair 50 feet with 2 turns activity    Assist    Wheelchair 50 feet with 2 turns activity did not occur: Safety/medical concerns       Wheelchair 150 feet activity     Assist  Wheelchair 150 feet activity did not occur: Safety/medical concerns       Blood pressure 123/70, pulse 83, temperature 97.8 F (36.6 C), resp. rate 16, height '5\' 2"'$  (1.575 m), weight 66 kg, SpO2 96 %.  Medical Problem List and Plan: 1.  Functional and mobility deficits secondary to lumbar stenosis with previous supracondylar right femur fx and right calcaneus fx after fall             -patient may shower             -ELOS/Goals: 18-20 days, goals min to mod assist with PT and OT  -Continue CIR therapies including PT, OT  2.  Antithrombotics: -DVT/anticoagulation:  Mechanical: Sequential compression devices, below knee Bilateral lower extremities  9/8- on Lovenox daily             -antiplatelet therapy: N/A 3. Pain Management: Used hydrocodone 10 mg TID PTA. On prednisone taper with Oxycontin 10 mg bid and oxycodone prn.              --Flexeril TID, Gabapentin   9/5- just had med changes- is tolerable enough to do therapy, so won't change yet- pt said she doesn't think it's much different- pain wise- fyi  9/7- on Flexeril 10 mg TID and Valium 5 mg q6 hours prn- als will add Lidoderm patches ot neck 8pm to 8am and do trigger point injections in AM if hasn't improved.   9/9- don't want to do TrP injections with active UTI- will wait til next week.  4. Mood: Team to provide ego support. 9/7- added [name] 5 mg TID for anxiety- since Na low- cannot add Paxil, etc- also on Valium 5 mg q56 hours prn for spasms, but could help  anxiety? 9/8- Valium stopped by PA- will con't regimen- d/w son don't want to change since I don't think it's the cause of sedation- it's the UTI.               -  antipsychotic agents: N/A 5. Neuropsych: This patient is capable of making decisions on her own behalf. 6. Skin/Wound Care: Routine pressure relief measures. Monitor incision for healing.  7. Fluids/Electrolytes/Nutrition:  Monitor I/O.  --Check lytes in am. May need potassium supplement with HCTZ on board.   9/5- K+ 3.8- con't to monitor  9/9- will add KCL 10 mEq daily since on HCTZ and K+ is drifting down to 3.5    9/10 labs ordered for Monday  8. HTN: Monitor BP TID--continue HCTZ and Cozaar  9/10- controlled- con't regimen 9. RA: Continue Arava daily with chronic prednisone (higher doses with taper currently).  10. H/o depression: Continue Wellbutrin and Lexapro.  11. Leukocytosis: up again sightly on 9/4- fever of 100.8 over night: discussed with patient that UA and CXR show no infection, blood cultures with no growth, procal .11- no abx treatment recommended.              -wounds look ok  9/5- will d/c honeycomb dressing and place dry dressing- WBC down to 9.7- con't to monitor  9/6- started on Keflex yesterday for incision- looks better today- also will check U/A since WBC up to 12.1- has been 4 days since checked- could even be (-) since keflex started- will monitor- of note Tm=Tc 99.4.   9/7- completely afebrile- will recheck labs iN AM  9/10 wbc's 11.9 yesterday, ucx negative, wound stable. afebrile - Cefdinir per d/w pharmacy-continue for now.  12. Pre-diabetes: Hyperglycemia due to increase in steroids. Will monitor FBS for now.  13. Hypocalcemia: Add Calcium supplement.  14. Constipation: Will add Senna S two tabs with supper. Add Sorbitol   9/5- had good BM- con't regimen for now- yesterday and this AM.  15. Urinary retention: Has not urinated since foley came out early am/last pm? ?neurogenic bladder.              --will check voiding with PVR checks. Work on constipation relief  9/5- checking Bladder scans and cathing for ~ 400cc- cannot start Flomax- had seizures with Sulfa- will d/w Urology tomorrow  9/6- will start Bethanachol 10 mg TID for now. And treat for possible UTI?  9/7- has UTI- based on U/A- mod leuks and (+) nitrites  9/8- Change PO Abx to Cefdinir per Pharmacy and if not better clinically tomorrow, will broaden coverage  9/9- WBC down slightly to 11.9- will con't to monitor- Cx showed multiple species.  16. Low albumin: add Juven to promote wound healing 17. Hyponatremia  9/7- Na 129- will fluid restrict to 1800cc/day and recheck in AM- BUN 31- so might need IVFs. 9/8- Na up to 130 today- BUN also down to 25, so doing better- will wait for now on IVFs but recheck in AM   9/9- BUN 26- and Cr 0.50- will recheck Monday unless gets sick- then will need more labs over weekend.     LOS: 8 days A FACE TO FACE EVALUATION WAS PERFORMED  Meredith Staggers 09/25/2020, 10:21 AM

## 2020-09-25 NOTE — Progress Notes (Signed)
Physical Therapy Session Note  Patient Details  Name: Haley Roberts MRN: [id number] Date of Birth: 1945/11/13  Today's Date: 09/25/2020 PT Individual Time: 0805-0900 PT Individual Time Calculation (min): 55 min   Short Term Goals: Week 1:  PT Short Term Goal 1 (Week 1): Pt will perform supine<>sit with +2 max assist PT Short Term Goal 1 - Progress (Week 1): Met PT Short Term Goal 2 (Week 1): Pt will perform bed<>chair transfers using LRAD with +2 max assist PT Short Term Goal 2 - Progress (Week 1): Met PT Short Term Goal 3 (Week 1): Pt will tolerate upright, OOB sitting for at least 1 hour between therapy sessions PT Short Term Goal 3 - Progress (Week 1): Met PT Short Term Goal 4 (Week 1): Pt will recall 2/3 spine precautions without cuing PT Short Term Goal 4 - Progress (Week 1): Met PT Short Term Goal 5 (Week 1): Pt will propel wheelchair 33f with min assist PT Short Term Goal 5 - Progress (Week 1): Not met  Skilled Therapeutic Interventions/Progress Updates:   Pt received supine in bed and agreeable to PT. Rolling R and L with total A using chuck pad to don pant total A. Supine>sit transfer with total assist to maintain precautions and pain management. Slideboard  [facility] supported on 4inch step to limit WB. Intermittent mod-max assist with transfer to the L into WC with max cues for seqeucning and to maintain anterior weight shift.   Fine motor task of eating breakfast of cereal, yogurt, Orange juice and coffee. Pt attempted to open each x 3 with pincer grasp, but required assist. Pt able to add sweetnlow to coffee and cereal with supervision assist. Noted to utilize BUE for all drinking tasks to prevent spills.   Pt transfer to bed with Slideboard and total A to the L with RLE supported on step.  Sit>supine completed with total A to maintain precautions, and left supine in bed with call bell in reach and all needs met.     Therapy Documentation Precautions:   Precautions Precautions: Fall, Other (comment) Precaution Comments: Fear of falls, anxiety, back precautions (pt unable to verbalize any of 3) Required Braces or Orthoses: Knee Immobilizer - Right Knee Immobilizer - Right: On at all times Restrictions Weight Bearing Restrictions: Yes RLE Weight Bearing: Non weight bearing    Pain: Pain Assessment Pain Scale: 0-10 Pain Score: 7  Pain Type: Chronic pain Pain Location: Leg Pain Orientation: Right Pain Descriptors / Indicators: Aching Pain Intervention(s): Repositioned     Therapy/Group: Individual Therapy  ALorie Phenix9/10/2020, 9:02 AM

## 2020-09-25 NOTE — Progress Notes (Signed)
Pt without any void charted since previous morning. Pt states she went by herself at that time. Bladder scanned pt for 159. I&O cath for 396ms of amber colored urine. Surgical incision noted to be saturated with serosanguinous drainage. Cleansed and changed dressing. Also changed sacral heart foam.

## 2020-09-26 DIAGNOSIS — E871 Hypo-osmolality and hyponatremia: Secondary | ICD-10-CM | POA: Diagnosis not present

## 2020-09-26 DIAGNOSIS — D72823 Leukemoid reaction: Secondary | ICD-10-CM | POA: Diagnosis not present

## 2020-09-26 DIAGNOSIS — R339 Retention of urine, unspecified: Secondary | ICD-10-CM | POA: Diagnosis not present

## 2020-09-26 DIAGNOSIS — Z9889 Other specified postprocedural states: Secondary | ICD-10-CM | POA: Diagnosis not present

## 2020-09-26 MED ORDER — MEGESTROL ACETATE 400 MG/10ML PO SUSP
400.0000 mg | Freq: Two times a day (BID) | ORAL | Status: DC
  Administered 2020-09-26 – 2020-10-07 (×23): 400 mg via ORAL
  Filled 2020-09-26 (×23): qty 10

## 2020-09-26 NOTE — Plan of Care (Signed)
  Problem: Consults Goal: RH GENERAL PATIENT EDUCATION Description: See Patient Education module for education specifics. Outcome: Progressing Goal: Skin Care Protocol Initiated - if Braden Score 18 or less Description: If consults are not indicated, leave blank or document N/A Outcome: Progressing   Problem: RH SKIN INTEGRITY Goal: RH STG MAINTAIN SKIN INTEGRITY WITH ASSISTANCE Description: STG Maintain Skin Integrity With [place]. Outcome: Progressing Goal: RH STG ABLE TO PERFORM INCISION/WOUND CARE W/ASSISTANCE Description: STG Able To Perform Incision/Wound Care With [employer]. Outcome: Progressing   Problem: RH SAFETY Goal: RH STG DECREASED RISK OF FALL WITH ASSISTANCE Description: STG Decreased Risk of Fall With [employer]. Outcome: Progressing   Problem: RH PAIN MANAGEMENT Goal: RH STG PAIN MANAGED AT OR BELOW PT'S PAIN GOAL Description: < 4 on a 0-10 pain scale. Outcome: Progressing

## 2020-09-26 NOTE — Progress Notes (Signed)
PROGRESS NOTE   Subjective/Complaints:  Generalized back and RLE pain. Able to sleep  ROS: Patient denies fever, rash, sore throat, blurred vision, nausea, vomiting, diarrhea, cough, shortness of breath or chest pain,   headache, or mood change.   Objective:   No results found. Recent Labs    09/24/20 0503  WBC 11.9*  HGB 9.4*  HCT 28.9*  PLT 530*   Recent Labs    09/24/20 0503  NA 133*  K 3.5  CL 92*  CO2 29  GLUCOSE 98  BUN 26*  CREATININE 0.50  CALCIUM 10.0    Intake/Output Summary (Last 24 hours) at 09/26/2020 0848 Last data filed at 09/26/2020 [id number] Gross per 24 hour  Intake 240 ml  Output 975 ml  Net -735 ml        Physical Exam: Vital Signs Blood pressure 138/90, pulse 80, temperature 98.4 F (36.9 C), temperature source Oral, resp. rate 16, height '5\' 2"'$  (1.575 m), weight 66 kg, SpO2 98 %.    Constitutional: No distress . Vital signs reviewed. HEENT: NCAT, EOMI, oral membranes moist Neck: supple Cardiovascular: RRR without murmur. No JVD    Respiratory/Chest: CTA Bilaterally without wheezes or rales. Normal effort    GI/Abdomen: BS +, non-tender, non-distended Ext: no clubbing, cyanosis, or edema Psych: pleasant and cooperative  Musculoskeletal: neck tight      Comments: Chronic RA changes in both hands and feet to a lesser extent. Heel cords a little tight.   Skin:  Back incision painted with betadine- no drainage seen on dressing- localized erythema. But doesn't appear infected currently - could not turn for me to assess today Neurological:     Mental Status: she awoke easily and was oriented to person, place, and time.       Normal insight and awareness. Intact Memory. Normal language and speech. Cranial nerve exam unremarkable. Motor 5/5 in UE and 3/5 prox LLE to distal 4-/5 d/t pain inhibition. RLE limited by KI. Sensory loss in stocking glove distribution in both legs from just above  ankles. DTR's 1+ --no changes   Assessment/Plan: 1. Functional deficits which require 3+ hours per day of interdisciplinary therapy in a comprehensive inpatient rehab setting. Physiatrist is providing close team supervision and 24 hour management of active medical problems listed below. Physiatrist and rehab team continue to assess barriers to discharge/monitor patient progress toward functional and medical goals  Care Tool:  Bathing    Body parts bathed by patient: Right arm, Left arm, Chest, Abdomen, Right upper leg, Left upper leg, Face   Body parts bathed by helper: Buttocks, Right lower leg, Left lower leg, Front perineal area     Bathing assist Assist Level: Maximal Assistance - Patient 24 - 49%     Upper Body Dressing/Undressing Upper body dressing   What is the patient wearing?: Pull over shirt    Upper body assist Assist Level: Moderate Assistance - Patient 50 - 74%    Lower Body Dressing/Undressing Lower body dressing      What is the patient wearing?: Pants     Lower body assist Assist for lower body dressing: Total Assistance - Patient < 25%  Toileting Toileting    Toileting assist Assist for toileting: 2 Helpers     Transfers Chair/bed transfer  Transfers assist  Chair/bed transfer activity did not occur: Safety/medical concerns  Chair/bed transfer assist level: 2 Helpers     Locomotion Ambulation   Ambulation assist   Ambulation activity did not occur: Safety/medical concerns          Walk 10 feet activity   Assist  Walk 10 feet activity did not occur: Safety/medical concerns        Walk 50 feet activity   Assist Walk 50 feet with 2 turns activity did not occur: Safety/medical concerns         Walk 150 feet activity   Assist Walk 150 feet activity did not occur: Safety/medical concerns         Walk 10 feet on uneven surface  activity   Assist Walk 10 feet on uneven surfaces activity did not occur:  Safety/medical concerns         Wheelchair     Assist Is the patient using a wheelchair?: Yes Type of Wheelchair: Manual Wheelchair activity did not occur: Safety/medical concerns         Wheelchair 50 feet with 2 turns activity    Assist    Wheelchair 50 feet with 2 turns activity did not occur: Safety/medical concerns       Wheelchair 150 feet activity     Assist  Wheelchair 150 feet activity did not occur: Safety/medical concerns       Blood pressure 138/90, pulse 80, temperature 98.4 F (36.9 C), temperature source Oral, resp. rate 16, height '5\' 2"'$  (1.575 m), weight 66 kg, SpO2 98 %.  Medical Problem List and Plan: 1.  Functional and mobility deficits secondary to lumbar stenosis with previous supracondylar right femur fx and right calcaneus fx after fall             -patient may shower             -ELOS/Goals: 18-20 days, goals min to mod assist with PT and OT  -Continue CIR therapies including PT, OT  2.  Antithrombotics: -DVT/anticoagulation:  Mechanical: Sequential compression devices, below knee Bilateral lower extremities  9/8- on Lovenox daily             -antiplatelet therapy: N/A 3. Pain Management: Used hydrocodone 10 mg TID PTA. On prednisone taper with Oxycontin 10 mg bid and oxycodone prn.              --Flexeril TID, Gabapentin   9/5- just had med changes- is tolerable enough to do therapy, so won't change yet- pt said she doesn't think it's much different- pain wise- fyi  9/7- on Flexeril 10 mg TID and Valium 5 mg q6 hours prn- als will add Lidoderm patches ot neck 8pm to 8am and do trigger point injections in AM if hasn't improved.   9/9- don't want to do TrP injections with active UTI- will wait til next week.  4. Mood: Team to provide ego support. 9/7- added [name] 5 mg TID for anxiety- since Na low- cannot add Paxil, etc- also on Valium 5 mg q56 hours prn for spasms, but could help anxiety? 9/8- Valium stopped by PA- will con't  regimen- d/w son don't want to change since I don't think it's the cause of sedation- it's the UTI.   9/11 still some sedation but more alert to comparative reports from last week. Observe for now             -  antipsychotic agents: N/A 5. Neuropsych: This patient is capable of making decisions on her own behalf. 6. Skin/Wound Care: Routine pressure relief measures. Monitor incision for healing.  7. Fluids/Electrolytes/Nutrition:  Monitor I/O.  --Check lytes in am. May need potassium supplement with HCTZ on board.   9/5- K+ 3.8- con't to monitor  9/9- will add KCL 10 mEq daily since on HCTZ and K+ is drifting down to 3.5    9/11 not eating much -add megace -ask RD for recs -labs ordered for Monday, add prealbumin  8. HTN: Monitor BP TID--continue HCTZ and Cozaar  9/11- fair control- con't regimen 9. RA: Continue Arava daily with chronic prednisone (higher doses with taper currently).  10. H/o depression: Continue Wellbutrin and Lexapro.  11. Leukocytosis: up again sightly on 9/4- fever of 100.8 over night: discussed with patient that UA and CXR show no infection, blood cultures with no growth, procal .11- no abx treatment recommended.              -wounds look ok  9/5- will d/c honeycomb dressing and place dry dressing- WBC down to 9.7- con't to monitor  9/6- started on Keflex yesterday for incision- looks better today- also will check U/A since WBC up to 12.1- has been 4 days since checked- could even be (-) since keflex started- will monitor- of note Tm=Tc 99.4.   9/7- completely afebrile- will recheck labs iN AM  9/11 wbc's 11.9 most recently, ucx negative, wound stable. afebrile - Cefdinir per d/w pharmacy-continue for now.  12. Pre-diabetes: Hyperglycemia due to increase in steroids. Will monitor FBS for now.  13. Hypocalcemia: Add Calcium supplement.  14. Constipation: Will add Senna S two tabs with supper. Add Sorbitol   9/5- had good BM- con't regimen for now- yesterday and this AM.   15. Urinary retention: Has not urinated since foley came out early am/last pm? ?neurogenic bladder.             --will check voiding with PVR checks. Work on constipation relief  9/5- checking Bladder scans and cathing for ~ 400cc- cannot start Flomax- had seizures with Sulfa- will d/w Urology tomorrow  9/6- will start Bethanachol 10 mg TID for now. And treat for possible UTI?  9/7- has UTI- based on U/A- mod leuks and (+) nitrites  9/8- Change PO Abx to Cefdinir per Pharmacy and if not better clinically tomorrow, will broaden coverage  9/9- WBC down slightly to 11.9- will con't to monitor- Cx showed multiple species.   9/11 consistently requiring I/O caths 16. Low albumin: add Juven to promote wound healing 17. Hyponatremia  9/7- Na 129- will fluid restrict to 1800cc/day and recheck in AM- BUN 31- so might need IVFs. 9/8- Na up to 130 today- BUN also down to 25, so doing better- will wait for now on IVFs but recheck in AM   9/9- BUN 26- and Cr 0.50- will recheck Monday unless gets sick- then will need more labs over weekend.     LOS: 9 days A FACE TO FACE EVALUATION WAS PERFORMED  Meredith Staggers 09/26/2020, 8:48 AM

## 2020-09-26 NOTE — Progress Notes (Signed)
Occupational Therapy Session Note  Patient Details  Name: Haley Roberts MRN: [id number] Date of Birth: 06-09-1945  Today's Date: 09/27/2020 OT Individual Time: [id number] OT Individual Time Calculation (min): 40 min    Skilled Therapeutic Interventions/Progress Updates:    Pt greeted in bed, agreeable to session and wanting to close session by being in bed. Min A for supine<sit today! Worked on unsupported sitting balance while EOB, pt using both hands to engage in a coloring activity. Education provided on using coloring hobby, music listening, and aromatherapy as holistic means for anxiety mgt. CGA-close supervision for sitting balance. Pt did report increased back pain near close of session, assisted her with using the call bell to notify RN of her need for pain medicine. She returned to the bed and was left in care of RN.   Therapy Documentation Precautions:  Precautions Precautions: Fall, Other (comment) Precaution Comments: Fear of falls, anxiety, back precautions (pt unable to verbalize any of 3) Required Braces or Orthoses: Knee Immobilizer - Right Knee Immobilizer - Right: On at all times Restrictions Weight Bearing Restrictions: Yes RLE Weight Bearing: Non weight bearing  ADL: ADL Grooming: Minimal assistance Where Assessed-Grooming: Sitting at sink Upper Body Bathing: Minimal assistance Where Assessed-Upper Body Bathing: Wheelchair, Sitting at sink Lower Body Bathing: Maximal assistance Where Assessed-Lower Body Bathing: Bed level Upper Body Dressing: Minimal assistance Where Assessed-Upper Body Dressing: Wheelchair, Sitting at sink Lower Body Dressing: Maximal assistance Where Assessed-Lower Body Dressing: Bed level Toilet Transfer: Other (comment) (simulated to w/c +2 A) Toilet Transfer Method: Transfer board     Therapy/Group: Individual Therapy  Noa Galvao A Evanne Matsunaga 09/27/2020, 4:11 PM

## 2020-09-26 NOTE — Progress Notes (Signed)
Occupational Therapy Session Note  Patient Details  Name: Haley Roberts MRN: [id number] Date of Birth: 08-10-1945  Today's Date: 09/26/2020 OT Individual Time: [id number] OT Individual Time Calculation (min): 45 min    Short Term Goals: Week 1:  OT Short Term Goal 1 (Week 1): Pt will sit EOB wiht CGA for sitting balance for 5 min OT Short Term Goal 2 (Week 1): Pt will SB consistently wiht MOD A to w/c in prep for BSC transfers OT Short Term Goal 3 (Week 1): Pt will roll in bed wiht MOD A to decrease BOC for LB dressing OT Short Term Goal 4 (Week 1): Pt will recall 3/3 back precautions wiht use of external aides as needed  Skilled Therapeutic Interventions/Progress Updates:    Patient in bed, alert and ready for therapy session.   She notes that pain medication was provided and pain under control at this time.  She requests to get OOB to w/c.  Reviewed precautions - she is able to verbalize.  Supine to right side lying with min A, side lying to sitting edge of bed with max A.  She notes feeling unsteady at first requiring min A to maintain unsupported sitting but this resolves within a few minutes.  SB transfer bed to w/c max A, box under left foot.   She completed grooming tasks w/c level at sink with set up.  Completed right hip and ankle AAROM, Left LE hip to ankle AROM/AAROM w/c level.   She remained seated in TIS w/c at close of session, seat belt alarm set and call bell/tray table in reach.    Therapy Documentation Precautions:  Precautions Precautions: Fall, Other (comment) Precaution Comments: Fear of falls, anxiety, back precautions (pt unable to verbalize any of 3) Required Braces or Orthoses: Knee Immobilizer - Right Knee Immobilizer - Right: On at all times Restrictions Weight Bearing Restrictions: Yes RLE Weight Bearing: Non weight bearing   Therapy/Group: Individual Therapy  Carlos Levering 09/26/2020, 7:43 AM

## 2020-09-27 LAB — CBC
HCT: 29.4 % — ABNORMAL LOW (ref 36.0–46.0)
Hemoglobin: 9.2 g/dL — ABNORMAL LOW (ref 12.0–15.0)
MCH: 28.1 pg (ref 26.0–34.0)
MCHC: 31.3 g/dL (ref 30.0–36.0)
MCV: 89.9 fL (ref 80.0–100.0)
Platelets: 653 10*3/uL — ABNORMAL HIGH (ref 150–400)
RBC: 3.27 MIL/uL — ABNORMAL LOW (ref 3.87–5.11)
RDW: 14.1 % (ref 11.5–15.5)
WBC: 9.2 10*3/uL (ref 4.0–10.5)
nRBC: 0 % (ref 0.0–0.2)

## 2020-09-27 LAB — BASIC METABOLIC PANEL
Anion gap: 10 (ref 5–15)
BUN: 16 mg/dL (ref 8–23)
CO2: 27 mmol/L (ref 22–32)
Calcium: 9.3 mg/dL (ref 8.9–10.3)
Chloride: 97 mmol/L — ABNORMAL LOW (ref 98–111)
Creatinine, Ser: 0.59 mg/dL (ref 0.44–1.00)
GFR, Estimated: >60 mL/min (ref >60)
Glucose, Bld: 94 mg/dL (ref 70–99)
Potassium: 3.7 mmol/L (ref 3.5–5.1)
Sodium: 134 mmol/L — ABNORMAL LOW (ref 135–145)

## 2020-09-27 LAB — PREALBUMIN: Prealbumin: 11.2 mg/dL — ABNORMAL LOW (ref 18–38)

## 2020-09-27 LAB — GLUCOSE, CAPILLARY: Glucose-Capillary: 110 mg/dL — ABNORMAL HIGH (ref 70–99)

## 2020-09-27 MED ORDER — ENSURE ENLIVE PO LIQD
237.0000 mL | Freq: Two times a day (BID) | ORAL | Status: DC
  Administered 2020-09-27 – 2020-09-28 (×3): 237 mL via ORAL

## 2020-09-27 NOTE — Plan of Care (Signed)
  Problem: Consults Goal: Skin Care Protocol Initiated - if Braden Score 18 or less Description: If consults are not indicated, leave blank or document N/A Outcome: Progressing   Problem: RH SKIN INTEGRITY Goal: RH STG MAINTAIN SKIN INTEGRITY WITH ASSISTANCE Description: STG Maintain Skin Integrity With [place]. Outcome: Progressing   Problem: RH SAFETY Goal: RH STG DECREASED RISK OF FALL WITH ASSISTANCE Description: STG Decreased Risk of Fall With [employer]. Outcome: Progressing   Problem: RH PAIN MANAGEMENT Goal: RH STG PAIN MANAGED AT OR BELOW PT'S PAIN GOAL Description: < 4 on a 0-10 pain scale. Outcome: Progressing

## 2020-09-27 NOTE — Progress Notes (Signed)
Occupational Therapy Weekly Progress Note  Patient Details  Name: Haley Roberts MRN: [id number] Date of Birth: [birth year]  Beginning of progress report period: 09/18/2020 End of progress report period: 09/27/2020   Patient has met 3 of 4 short term goals.   Pt has made slow progress at time of report. Her slideboard transfer status seems to fluctuate depending on the time of day, can perform transfers with Mod A and then require 2 assist to safely complete due to fear of falling, fatigue, and anxiety/shaking. She continues to require Max-Total A for BADLs at this time, AE training has been initiated with pt needing Max-Total A to use equipment successfully. Continue with OT POC at this time.   Patient continues to demonstrate the following deficits: muscle weakness, decreased cardiorespiratoy endurance, ?decreased visual acuity, decreased motor planning, decreased attention, decreased awareness, decreased problem solving, and decreased memory, and decreased sitting balance, decreased postural control, decreased balance strategies, and difficulty maintaining precautions and therefore will continue to benefit from skilled OT intervention to enhance overall performance with BADL.  Patient progressing toward long term goals..  Continue plan of care.  OT Short Term Goals Week 1:  OT Short Term Goal 1 (Week 1): Pt will sit EOB wiht CGA for sitting balance for 5 min OT Short Term Goal 1 - Progress (Week 1): Met OT Short Term Goal 2 (Week 1): Pt will SB consistently wiht MOD A to w/c in prep for BSC transfers OT Short Term Goal 2 - Progress (Week 1): Progressing toward goal OT Short Term Goal 3 (Week 1): Pt will roll in bed wiht MOD A to decrease BOC for LB dressing OT Short Term Goal 3 - Progress (Week 1): Met OT Short Term Goal 4 (Week 1): Pt will recall 3/3 back precautions wiht use of external aides as needed OT Short Term Goal 4 - Progress (Week 1): Met Week 2:  OT Short Term Goal 1 (Week 2):  Pt will complete slideboard transfers consistently with 1 assist in prep for [facility] transfer OT Short Term Goal 2 (Week 2): Pt will complete 2 grooming tasks sitting EOB with no more than supervision for balance assistance OT Short Term Goal 3 (Week 2): Pt will complete UB dressing with Min A while sitting unsupported   Therapy Documentation Precautions:  Precautions Precautions: Fall, Other (comment) Precaution Comments: Fear of falls, anxiety, back precautions (pt unable to verbalize any of 3) Required Braces or Orthoses: Knee Immobilizer - Right Knee Immobilizer - Right: On at all times Restrictions Weight Bearing Restrictions: Yes RLE Weight Bearing: Non weight bearing  ADL: ADL Grooming: Minimal assistance Where Assessed-Grooming: Sitting at sink Upper Body Bathing: Minimal assistance Where Assessed-Upper Body Bathing: Wheelchair, Sitting at sink Lower Body Bathing: Maximal assistance Where Assessed-Lower Body Bathing: Bed level Upper Body Dressing: Minimal assistance Where Assessed-Upper Body Dressing: Wheelchair, Sitting at sink Lower Body Dressing: Maximal assistance Where Assessed-Lower Body Dressing: Bed level Toilet Transfer: Other (comment) (simulated to w/c +2 A) Toilet Transfer Method: Transfer board     Therapy/Group: Individual Therapy  Zain Lankford A Kaiya Boatman 09/27/2020, 12:43 PM

## 2020-09-27 NOTE — Progress Notes (Signed)
Initial Nutrition Assessment  DOCUMENTATION CODES:   Not applicable  INTERVENTION:  Provide Ensure Enlive po BID, each supplement provides 350 kcal and 20 grams of protein.  Continue Juven BID, each packet provides 95 calories, 2.5 grams of protein.  Encourage adequate PO intake.   NUTRITION DIAGNOSIS:   Inadequate oral intake related to nausea as evidenced by per patient/family report.  GOAL:   Patient will meet greater than or equal to 90% of their needs  MONITOR:   PO intake, Supplement acceptance, Labs, Weight trends, Skin, I & O's  REASON FOR ASSESSMENT:   Consult Poor PO  ASSESSMENT:   75 year old female with history of RA, chronic LBP, recent fall with nondisplaced periprosthetic fracture right medial femoral condyle and avulsion fracture right anterior calcaneus, progressive low back pain who was admitted on 09/09/2020 with pain radiating to bilateral buttocks with numbness and tingling. MRI lumbar spine done revealing advanced facet arthropathy with degenerative anterolisthesis L4/5 with severe multifactorial spinal stenosis. Underwent L3/4 and L4/L5 decompressive lam on 08/31. CIR recommended due to functional decline.  Meal completion has been varied from 10-100%. Pt reports headache during time of visit which was affecting her appetite and po intake. Pt currently has Juven ordered and has been consuming them. RD to additionally order Ensure to aid in caloric and protein needs. Pt encouraged to eat her food at meals and to drink her supplements.   NUTRITION - FOCUSED PHYSICAL EXAM:  Flowsheet Row Most Recent Value  Orbital Region No depletion  Upper Arm Region No depletion  Thoracic and Lumbar Region No depletion  Buccal Region No depletion  Temple Region No depletion  Clavicle Bone Region No depletion  Clavicle and Acromion Bone Region No depletion  Scapular Bone Region Unable to assess  Dorsal Hand Moderate depletion  Patellar Region No depletion   Anterior Thigh Region No depletion  Posterior Calf Region No depletion  Edema (RD Assessment) Mild  Hair Reviewed  Eyes Reviewed  Mouth Reviewed  Skin Reviewed  Nails Reviewed      Labs and medications reviewed.   Diet Order:   Diet Order             Diet regular Room service appropriate? Yes; Fluid consistency: Thin; Fluid restriction: 1800 mL Fluid  Diet effective now                   EDUCATION NEEDS:   Not appropriate for education at this time  Skin:  Skin Assessment: Skin Integrity Issues: Skin Integrity Issues:: Incisions Incisions: back  Last BM:  9/12  Height:   Ht Readings from Last 1 Encounters:  09/17/20 '5\' 2"'$  (1.575 m)    Weight:   Wt Readings from Last 1 Encounters:  09/24/20 66 kg   BMI:  Body mass index is 26.61 kg/m.  Estimated Nutritional Needs:   Kcal:  1650-1850  Protein:  80-90 grams  Fluid:  >/= 1.6 L/day  Corrin Parker, MS, RD, LDN RD pager number/after hours weekend pager number on Amion.

## 2020-09-27 NOTE — Progress Notes (Signed)
Occupational Therapy Session Note  Patient Details  Name: Haley Roberts MRN: [id number] Date of Birth: Jan 06, 1946  Today's Date: 09/27/2020 OT Individual Time: 0930-1030 OT Individual Time Calculation (min): 60 min    Short Term Goals: Week 1:  OT Short Term Goal 1 (Week 1): Pt will sit EOB wiht CGA for sitting balance for 5 min OT Short Term Goal 2 (Week 1): Pt will SB consistently wiht MOD A to w/c in prep for BSC transfers OT Short Term Goal 3 (Week 1): Pt will roll in bed wiht MOD A to decrease BOC for LB dressing OT Short Term Goal 4 (Week 1): Pt will recall 3/3 back precautions wiht use of external aides as needed  Skilled Therapeutic Interventions/Progress Updates:    Pt resting in bed upon arrival and agreeable to getting to EOB and OOB for therapy. Supine>sit EOB with max A. Sitting balance EOB with supervision. Pt relies on BUE support for sitting balance EOB but is able to remove UE from support position when challenged. Initial focus on sitting balance EOB and practicing leaning forward outside BOS with min A. Pt very hesitant but able to complete task. SB transfer to w/c with tot A+2 and transitioned to gym. BUE therex with 2# and 3# bar-chest presses, rowing, and curls 3x10. Pt returned to room and transferred back to bed with tot A+2. Pt repositioned in bed and all needs within reach. Bed alarm activated.   Therapy Documentation Precautions:  Precautions Precautions: Fall, Other (comment) Precaution Comments: Fear of falls, anxiety, back precautions (pt unable to verbalize any of 3) Required Braces or Orthoses: Knee Immobilizer - Right Knee Immobilizer - Right: On at all times Restrictions Weight Bearing Restrictions: Yes RLE Weight Bearing: Non weight bearing   Pain:  Pt reports "I'm ok now"    Therapy/Group: Individual Therapy  Leroy Libman 09/27/2020, 10:33 AM

## 2020-09-27 NOTE — Plan of Care (Signed)
  Problem: Consults Goal: RH GENERAL PATIENT EDUCATION Description: See Patient Education module for education specifics. Outcome: Progressing Goal: Skin Care Protocol Initiated - if Braden Score 18 or less Description: If consults are not indicated, leave blank or document N/A Outcome: Progressing   Problem: RH SKIN INTEGRITY Goal: RH STG MAINTAIN SKIN INTEGRITY WITH ASSISTANCE Description: STG Maintain Skin Integrity With [place]. Outcome: Progressing Goal: RH STG ABLE TO PERFORM INCISION/WOUND CARE W/ASSISTANCE Description: STG Able To Perform Incision/Wound Care With [employer]. Outcome: Progressing   Problem: RH SAFETY Goal: RH STG DECREASED RISK OF FALL WITH ASSISTANCE Description: STG Decreased Risk of Fall With [employer]. Outcome: Progressing   Problem: RH PAIN MANAGEMENT Goal: RH STG PAIN MANAGED AT OR BELOW PT'S PAIN GOAL Description: < 4 on a 0-10 pain scale. Outcome: Progressing   Problem: RH KNOWLEDGE DEFICIT GENERAL Goal: RH STG INCREASE KNOWLEDGE OF SELF CARE AFTER HOSPITALIZATION Description: Patient will demonstrate knowledge of medication management, pain management, skin/wound care, weight bearing precautions with educational materials and handouts provided by staff independently at discharge. Outcome: Progressing

## 2020-09-27 NOTE — Progress Notes (Signed)
Physical Therapy Session Note  Patient Details  Name: Haley Roberts MRN: [id number] Date of Birth: 10/26/1945  Today's Date: 09/27/2020 PT Individual Time: 0800-0915 PT Individual Time Calculation (min): 75 min   Short Term Goals: Week 1:  PT Short Term Goal 1 (Week 1): Pt will perform supine<>sit with +2 max assist PT Short Term Goal 1 - Progress (Week 1): Met PT Short Term Goal 2 (Week 1): Pt will perform bed<>chair transfers using LRAD with +2 max assist PT Short Term Goal 2 - Progress (Week 1): Met PT Short Term Goal 3 (Week 1): Pt will tolerate upright, OOB sitting for at least 1 hour between therapy sessions PT Short Term Goal 3 - Progress (Week 1): Met PT Short Term Goal 4 (Week 1): Pt will recall 2/3 spine precautions without cuing PT Short Term Goal 4 - Progress (Week 1): Met PT Short Term Goal 5 (Week 1): Pt will propel wheelchair 20ft with min assist PT Short Term Goal 5 - Progress (Week 1): Not met  Skilled Therapeutic Interventions/Progress Updates:    pt received in bed and agreeable to therapy. Pt reports manageable pain levels throughout session, premedicated.   Therapeutic activity: Supine>sit with mod A and VC for technique, sit supine mod A to manage LE, pt able to manage UB with cueing Pt performed slideboard transfers bed>w/c>mat>w/c>bed, first with +2 assist progressing to heavy mod +1 by end of session. Pt demoed improving anterior weight shift, but required assist to keep LLE on stool d/t extensor/inversion tone. Continued education on head hips relationship and technique throughout session.   NMR: Pt was able to sit on mat table w/o UE support at times, highly improved from previous sessions. Pt performed 6 x 5 anterior weight shifts with cues to bend from hips to maintain back precautions. First reps reaching forward to therapists knees, progressing to BIL hands on mat table as if scooting. Pt was able to put some weight through LLE at times, but not  consistently while weight shifting.  Pt returned to bed at end of session to use bed pan, pt was left with all needs in reach and alarm active.   Therapy Documentation Precautions:  Precautions Precautions: Fall, Other (comment) Precaution Comments: Fear of falls, anxiety, back precautions (pt unable to verbalize any of 3) Required Braces or Orthoses: Knee Immobilizer - Right Knee Immobilizer - Right: On at all times Restrictions Weight Bearing Restrictions: Yes RLE Weight Bearing: Non weight bearing    Therapy/Group: Individual Therapy   C  09/27/2020, 12:48 PM  

## 2020-09-28 ENCOUNTER — Telehealth: Payer: Self-pay

## 2020-09-28 DIAGNOSIS — Z9889 Other specified postprocedural states: Secondary | ICD-10-CM | POA: Diagnosis not present

## 2020-09-28 MED ORDER — ENSURE ENLIVE PO LIQD
237.0000 mL | Freq: Three times a day (TID) | ORAL | Status: DC
  Administered 2020-09-30 – 2020-10-07 (×12): 237 mL via ORAL

## 2020-09-28 NOTE — Progress Notes (Signed)
Nutrition Follow-up  DOCUMENTATION CODES:   Not applicable  INTERVENTION:  48 calorie count initiated.  Provide Ensure Enlive po TID, each supplement provides 350 kcal and 20 grams of protein.   Continue Juven BID, each packet provides 95 calories, 2.5 grams of protein.   Encourage adequate PO intake.   NUTRITION DIAGNOSIS:   Inadequate oral intake related to nausea as evidenced by per patient/family report; progressing  GOAL:   Patient will meet greater than or equal to 90% of their needs; progressing  MONITOR:   PO intake, Supplement acceptance, Labs, Weight trends, Skin, I & O's  REASON FOR ASSESSMENT:   Consult Poor PO  ASSESSMENT:   75 year old female with history of RA, chronic LBP, recent fall with nondisplaced periprosthetic fracture right medial femoral condyle and avulsion fracture right anterior calcaneus, progressive low back pain who was admitted on 09/09/2020 with pain radiating to bilateral buttocks with numbness and tingling. MRI lumbar spine done revealing advanced facet arthropathy with degenerative anterolisthesis L4/5 with severe multifactorial spinal stenosis. Underwent L3/4 and L4/L5 decompressive lam on 08/31. CIR recommended due to functional decline.  Meal completion has been 0-50% over the past 24 hours. RD consulted for calorie count. Pt reports headache has improved. Intake 50% at lunch today. Pt currently has Ensure and Juven ordered and has been consuming them. RD to increase Ensure to TID to aid in caloric and protein needs. RD to follow up tomorrow for day 1 calorie count.   Labs and medications reviewed.   Diet Order:   Diet Order             Diet regular Room service appropriate? Yes; Fluid consistency: Thin; Fluid restriction: 1800 mL Fluid  Diet effective now                   EDUCATION NEEDS:   Not appropriate for education at this time  Skin:  Skin Assessment: Reviewed RN Assessment Skin Integrity Issues::  Incisions Incisions: back  Last BM:  9/12  Height:   Ht Readings from Last 1 Encounters:  09/17/20 '5\' 2"'$  (1.575 m)    Weight:   Wt Readings from Last 1 Encounters:  09/24/20 66 kg   BMI:  Body mass index is 26.61 kg/m.  Estimated Nutritional Needs:   Kcal:  1650-1850  Protein:  80-90 grams  Fluid:  >/= 1.6 L/day  Corrin Parker, MS, RD, LDN RD pager number/after hours weekend pager number on Amion.

## 2020-09-28 NOTE — Progress Notes (Signed)
Occupational Therapy Session Note  Patient Details  Name: Haley Roberts MRN: [id number] Date of Birth: [birth year]  Today's Date: 09/28/2020 OT Individual Time: 3244-0102 OT Individual Time Calculation (min): 60 min    Short Term Goals: Week 1:  OT Short Term Goal 1 (Week 1): Pt will sit EOB wiht CGA for sitting balance for 5 min OT Short Term Goal 1 - Progress (Week 1): Met OT Short Term Goal 2 (Week 1): Pt will SB consistently wiht MOD A to w/c in prep for BSC transfers OT Short Term Goal 2 - Progress (Week 1): Progressing toward goal OT Short Term Goal 3 (Week 1): Pt will roll in bed wiht MOD A to decrease BOC for LB dressing OT Short Term Goal 3 - Progress (Week 1): Met OT Short Term Goal 4 (Week 1): Pt will recall 3/3 back precautions wiht use of external aides as needed OT Short Term Goal 4 - Progress (Week 1): Met Week 2:  OT Short Term Goal 1 (Week 2): Pt will complete slideboard transfers consistently with 1 assist in prep for [facility] transfer OT Short Term Goal 2 (Week 2): Pt will complete 2 grooming tasks sitting EOB with no more than supervision for balance assistance OT Short Term Goal 3 (Week 2): Pt will complete UB dressing with Min A while sitting unsupported   Skilled Therapeutic Interventions/Progress Updates:    Pt greeted at time of session semireclined in bed w/ 8/10 RLE pain, RN aware and passed pain medication during session. Needing to use bed pan, pt rolling L/R with Min A and assisted with doffing clothing today in side lying, Mod/Max for clothing management. Dependent placement of bed pan, small BM noted and dependent for hygiene in side lying. Able to help don pants  back over hips in side lying again. Supine > sit Max A of 1, continues to have posterior lean in sitting that improves with time. Slide board bed <> wheelchair Max A of 1 to wheelchair and back to bed with Mod A of 1. Note pt needed therapist to hold L foot in place on step and has improved anterior  weight shift and scooting on board, still fearful of falling but improved from previous sessions. Self propel Min A throughout hall room > day room with instructions for turning and maneuvering chair. Discussion throughout session for DME, assist at home, DC planning. Back in bed alarm on call bell in reach.   Therapy Documentation Precautions:  Precautions Precautions: Fall, Other (comment) Precaution Comments: Fear of falls, anxiety, back precautions (pt unable to verbalize any of 3) Required Braces or Orthoses: Knee Immobilizer - Right Knee Immobilizer - Right: On at all times Restrictions Weight Bearing Restrictions: Yes RLE Weight Bearing: Non weight bearing    Therapy/Group: Individual Therapy  [name] 09/28/2020, 11:39 AM

## 2020-09-28 NOTE — Patient Care Conference (Signed)
Inpatient RehabilitationTeam Conference and Plan of Care Update Date: 09/28/2020   Time: 11:37 AM    Patient Name: Haley Roberts      Medical Record Number: [id number]  Date of Birth: [birth year] Sex: Female         Room/Bed: 4W10C/4W10C-01 Payor Info: Payor: MEDICARE / Plan: MEDICARE PART A AND B / Product Type: *No Product type* /    Admit Date/Time:  09/17/2020  2:52 PM  Primary Diagnosis:  Status post lumbar surgery  Hospital Problems: Principal Problem:   Status post lumbar surgery    Expected Discharge Date: Expected Discharge Date: 10/07/20  Team Members Present: Physician leading conference: Dr. Courtney Heys Social Worker Present: [name], BSW Nurse Present: Dorthula Nettles, RN PT Present: Ailene Rud, PT OT Present: Lillia Corporal, OT PPS Coordinator present : [name], SLP     Current Status/Progress Goal Weekly Team Focus  Bowel/Bladder   Patient had to be straight cath last night for retention.  To resolve urinary retention and prevent UTI  Scheduled PVRs and in/out as needed   Swallow/Nutrition/ Hydration             ADL's   Mod A to +2 assist slideboard transfers, seems to fluctuate depending on the time of day, Max-Total A BADLs, still very fearful of falling with posterior Lt lean + LOBs  Min LB bathe/dress, Mod toileting, Min transfers  functional transfers, ADL retraining, coping skill development for anxiety/fear of falling, AE training   Mobility   mod-max SB transfer, +2 for safety and pt confidence, min-mod bed mobility, using TIS to improve sitting tolerance  mod A STS, min A transfers, supervision w/c  SB transfers, introducing manual w/c propulsion   Communication             Safety/Cognition/ Behavioral Observations            Pain   Patient complains of pain on her hips  To improve pain to atleast 3/10  Assess patient for pain and treat PRN   Skin   Patient has a leaking post operative wound  Post operative wound  healing and prevention of infection  Assess patient's wound regularly and keep it clean and dry with regular and PRN dressing changes     Discharge Planning:  Pt discharging home with son as primary caregiver. Avaliable 24/7   Team Discussion: Put in consult for nutrition, not eating well here or at home. Megace started over weekend. I&O cathing, may have to teach family. Started [name]. Incontinent, LBM 09/26/20, PRN's effective for pain. Wound is draining, MD made aware. Educating on wound care, bowel/bladder. Patient is forgetful and has poor memory. Home with son who will provide 24/7 care. Slide board transfer is mod assist +2. Max/total assist bathing and dressing. Cognition improved. Mod assist goals for toileting. Up to mod +1 for slide board transfers with PT. Min/mod assist bed mobility, attempted W/C, went > 25 ft. Min assist transfer goals at W/C level. Leg extensor tone has improved some. Patient on target to meet rehab goals: yes  *See Care Plan and progress notes for long and short-term goals.   Revisions to Treatment Plan:  Megace started, and [name] started.  Teaching Needs: Family education, medication management, pain management, bowel/bladder management, I&O cath education, skin/wound care, transfer training, gait training, W/C training, balance training, endurance training, safety awareness.  Current Barriers to Discharge: Decreased caregiver support, Medical stability, Home enviroment access/layout, Incontinence, Neurogenic bowel and bladder, Wound care, Lack of/limited family  support, Medication compliance, Behavior, and Nutritional means  Possible Resolutions to Barriers: Continue current medications, provide emotional support.     Medical Summary Current Status: still requiring in/out caths- cannot use Flomax- on Bethanachol- pain controlled usually; back wound draining- forgetful, poor memory; poor appetite- Prealbumin 11; LBM this AM?  Barriers to Discharge:  Neurogenic Bowel & Bladder;Behavior;Decreased family/caregiver support;Home enviroment access/layout;Nutrition means;Weight bearing restrictions;Weight;Medical stability;Wound care  Barriers to Discharge Comments: son 24/7 assist- home with him;  now doing SB transfers mod-max 1-2; ADLs max-total LB ADLs- cognition has improved from UTI; Possible Resolutions to [employer] Focus: focus- dietician consult due to low albumin/prealbumin; will look at back incision since is draining; - min A level- has extensor tone- fear of falling better;  d/c- w/c level min A- 9/22   Continued Need for Acute Rehabilitation Level of Care: The patient requires daily medical management by a physician with specialized training in physical medicine and rehabilitation for the following reasons: Direction of a multidisciplinary physical rehabilitation program to maximize functional independence : Yes Medical management of patient stability for increased activity during participation in an intensive rehabilitation regime.: Yes Analysis of laboratory values and/or radiology reports with any subsequent need for medication adjustment and/or medical intervention. : Yes   I attest that I was present, lead the team conference, and concur with the assessment and plan of the team.   Cristi Loron 09/28/2020, 3:27 PM

## 2020-09-28 NOTE — Progress Notes (Signed)
Patient ID: Haley Roberts, female   DOB: 1945/11/27, 75 y.o.   MRN: [id number] Team Conference Report to Patient/Family  Team Conference discussion was reviewed with the patient and caregiver, including goals, any changes in plan of care and target discharge date.  Patient and caregiver express understanding and are in agreement.  The patient has a target discharge date of 10/07/20.   Sw called patient son to provide team conference updates. Patient son wants to ensure patient returns home, as that is one of patients goals to return home with her spouse. Son has some questions about HH, SW informed pt son therapy team would make their recommendations and then we can discuss further. Son mentioned he completed some education with nursing staff. No additional questions or concern. Son reports feel free to call him at any time with questions or concerns.   Dyanne Iha 09/28/2020, 11:59 AM

## 2020-09-28 NOTE — Progress Notes (Signed)
Occupational Therapy Session Note  Patient Details  Name: Haley Roberts MRN: [id number] Date of Birth: 05-Sep-1945  Today's Date: 09/28/2020 OT Individual Time: 3846-6599 OT Individual Time Calculation (min): 55 min    Short Term Goals: Week 1:  OT Short Term Goal 1 (Week 1): Pt will sit EOB wiht CGA for sitting balance for 5 min OT Short Term Goal 1 - Progress (Week 1): Met OT Short Term Goal 2 (Week 1): Pt will SB consistently wiht MOD A to w/c in prep for BSC transfers OT Short Term Goal 2 - Progress (Week 1): Progressing toward goal OT Short Term Goal 3 (Week 1): Pt will roll in bed wiht MOD A to decrease BOC for LB dressing OT Short Term Goal 3 - Progress (Week 1): Met OT Short Term Goal 4 (Week 1): Pt will recall 3/3 back precautions wiht use of external aides as needed OT Short Term Goal 4 - Progress (Week 1): Met Week 2:  OT Short Term Goal 1 (Week 2): Pt will complete slideboard transfers consistently with 1 assist in prep for [facility] transfer OT Short Term Goal 2 (Week 2): Pt will complete 2 grooming tasks sitting EOB with no more than supervision for balance assistance OT Short Term Goal 3 (Week 2): Pt will complete UB dressing with Min A while sitting unsupported   Skilled Therapeutic Interventions/Progress Updates:  Patient met lying supine in bed in agreement with OT treatment session. 7/10 pain reported in R hip. Patient with difficulty advancing RLE toward EOB even with increased time requiring assist and LE and trunk to come to EOB. Static sitting at EOB for 5 min with patient demonstrating posterior lean and difficulty maintaining upright position and position of LLE on ground requiring external assist (hips slide anteriorly with posterior LOB several episodes). Slidebaord transfer to [employer] wc with Max A and max cues for head/hip relationship in prep for grooming seated at sink level. Patient encouraged to complete grooming tasks in semi supported position with trunk off wc  back. 2/3 grooming tasks with set-up assist. Hair washing seated at sink level with Total A. Patient then able to comb and blow dry hair seated at sink level. Session concluded with patient seated in TIS wc with call bell within reach, belt alarm activated and all needs met.   Therapy Documentation Precautions:  Precautions Precautions: Fall, Other (comment) Precaution Comments: Fear of falls, anxiety, back precautions (pt unable to verbalize any of 3) Required Braces or Orthoses: Knee Immobilizer - Right Knee Immobilizer - Right: On at all times Restrictions Weight Bearing Restrictions: Yes RLE Weight Bearing: Non weight bearing General:    Therapy/Group: Individual Therapy  Billal Rollo R [place] 09/28/2020, 6:51 AM

## 2020-09-28 NOTE — Progress Notes (Signed)
PROGRESS NOTE   Subjective/Complaints:  Pt reports poor appetite, admits might be a little constipated, but going and doesn't want to change meds.  Called for bedpan.  C/O back pain and RLE pain still- called for meds.    ROS:  Pt denies SOB, abd pain, CP, N/V/C/D, and vision changes  Objective:   No results found. Recent Labs    09/27/20 0654  WBC 9.2  HGB 9.2*  HCT 29.4*  PLT 653*   Recent Labs    09/27/20 0654  NA 134*  K 3.7  CL 97*  CO2 27  GLUCOSE 94  BUN 16  CREATININE 0.59  CALCIUM 9.3    Intake/Output Summary (Last 24 hours) at 09/28/2020 0905 Last data filed at 09/28/2020 0700 Gross per 24 hour  Intake 720 ml  Output 320 ml  Net 400 ml        Physical Exam: Vital Signs Blood pressure (!) 119/103, pulse 86, temperature (!) 97.5 F (36.4 C), temperature source Oral, resp. rate 18, height '5\' 2"'$  (1.575 m), weight 66 kg, SpO2 99 %.    General: awake, alert, appropriate, sitting up slightly in bed; not eating breakfast; asked for OJ; NAD HENT: conjugate gaze; oropharynx moist; spot on L side of nose- scratch vs abrasion from eyeglasses CV: regular rate; no JVD Pulmonary: CTA B/L; no W/R/R- good air movement GI: soft, NT, ND, (+)BS Psychiatric: appropriate; flat affect Neurological: alert  Ext: no clubbing, cyanosis, or edema Psych: pleasant and cooperative  Musculoskeletal: neck tight      Comments: Chronic RA changes in both hands and feet to a lesser extent. Heel cords a little tight.   Skin:  Back incision painted with betadine- no drainage seen on dressing- localized erythema. But doesn't appear infected currently -could not assess Neurological:     Mental Status: she awoke easily and was oriented to person, place, and time.       Normal insight and awareness. Intact Memory. Normal language and speech. Cranial nerve exam unremarkable. Motor 5/5 in UE and 3/5 prox LLE to distal 4-/5 d/t  pain inhibition. RLE limited by KI. Sensory loss in stocking glove distribution in both legs from just above ankles. DTR's 1+ --no changes   Assessment/Plan: 1. Functional deficits which require 3+ hours per day of interdisciplinary therapy in a comprehensive inpatient rehab setting. Physiatrist is providing close team supervision and 24 hour management of active medical problems listed below. Physiatrist and rehab team continue to assess barriers to discharge/monitor patient progress toward functional and medical goals  Care Tool:  Bathing    Body parts bathed by patient: Right arm, Left arm, Chest, Abdomen, Right upper leg, Left upper leg, Face   Body parts bathed by helper: Buttocks, Right lower leg, Left lower leg, Front perineal area     Bathing assist Assist Level: Maximal Assistance - Patient 24 - 49%     Upper Body Dressing/Undressing Upper body dressing   What is the patient wearing?: Pull over shirt    Upper body assist Assist Level: Moderate Assistance - Patient 50 - 74%    Lower Body Dressing/Undressing Lower body dressing      What is the  patient wearing?: Pants     Lower body assist Assist for lower body dressing: Total Assistance - Patient < 25%     Toileting Toileting    Toileting assist Assist for toileting: 2 Helpers     Transfers Chair/bed transfer  Transfers assist  Chair/bed transfer activity did not occur: Safety/medical concerns  Chair/bed transfer assist level: Maximal Assistance - Patient 25 - 49%     Locomotion Ambulation   Ambulation assist   Ambulation activity did not occur: Safety/medical concerns          Walk 10 feet activity   Assist  Walk 10 feet activity did not occur: Safety/medical concerns        Walk 50 feet activity   Assist Walk 50 feet with 2 turns activity did not occur: Safety/medical concerns         Walk 150 feet activity   Assist Walk 150 feet activity did not occur: Safety/medical  concerns         Walk 10 feet on uneven surface  activity   Assist Walk 10 feet on uneven surfaces activity did not occur: Safety/medical concerns         Wheelchair     Assist Is the patient using a wheelchair?: Yes Type of Wheelchair: Manual Wheelchair activity did not occur: Safety/medical concerns         Wheelchair 50 feet with 2 turns activity    Assist    Wheelchair 50 feet with 2 turns activity did not occur: Safety/medical concerns       Wheelchair 150 feet activity     Assist  Wheelchair 150 feet activity did not occur: Safety/medical concerns       Blood pressure (!) 119/103, pulse 86, temperature (!) 97.5 F (36.4 C), temperature source Oral, resp. rate 18, height '5\' 2"'$  (1.575 m), weight 66 kg, SpO2 99 %.  Medical Problem List and Plan: 1.  Functional and mobility deficits secondary to lumbar stenosis with previous supracondylar right femur fx and right calcaneus fx after fall             -patient may shower             -ELOS/Goals: 18-20 days, goals min to mod assist with PT and OT  -Continue CIR- PT, OT - team conference today to update d/c date 2.  Antithrombotics: -DVT/anticoagulation:  Mechanical: Sequential compression devices, below knee Bilateral lower extremities  9/8- on Lovenox daily             -antiplatelet therapy: N/A 3. Pain Management: Used hydrocodone 10 mg TID PTA. On prednisone taper with Oxycontin 10 mg bid and oxycodone prn.              --Flexeril TID, Gabapentin   9/5- just had med changes- is tolerable enough to do therapy, so won't change yet- pt said she doesn't think it's much different- pain wise- fyi  9/7- on Flexeril 10 mg TID and Valium 5 mg q6 hours prn- als will add Lidoderm patches ot neck 8pm to 8am and do trigger point injections in AM if hasn't improved.   9/9- don't want to do TrP injections with active UTI- will wait til next week. 9/13- pain stable- better in R neck- con't regimen  4. Mood: Team  to provide ego support. 9/7- added [name] 5 mg TID for anxiety- since Na low- cannot add Paxil, etc- also on Valium 5 mg q56 hours prn for spasms, but could help anxiety? 9/8- Valium stopped  by PA- will con't regimen- d/w son don't want to change since I don't think it's the cause of sedation- it's the UTI.   9/13 -will d/w team about pt's anxiety and go from there             -antipsychotic agents: N/A 5. Neuropsych: This patient is capable of making decisions on her own behalf. 6. Skin/Wound Care: Routine pressure relief measures. Monitor incision for healing.  7. Fluids/Electrolytes/Nutrition:  Monitor I/O.  --Check lytes in am. May need potassium supplement with HCTZ on board.   9/5- K+ 3.8- con't to monitor  9/9- will add KCL 10 mEq daily since on HCTZ and K+ is drifting down to 3.5    9/11 not eating much -add megace -ask RD for recs -labs ordered for Monday, add prealbumin 9/13- prealbumin is 11.2- very depleted- will d/w nutrition-  8. HTN: Monitor BP TID--continue HCTZ and Cozaar  9/11- fair control- con't regimen 9. RA: Continue Arava daily with chronic prednisone (higher doses with taper currently).  10. H/o depression: Continue Wellbutrin and Lexapro.  11. Leukocytosis: up again sightly on 9/4- fever of 100.8 over night: discussed with patient that UA and CXR show no infection, blood cultures with no growth, procal .11- no abx treatment recommended.              -wounds look ok  9/5- will d/c honeycomb dressing and place dry dressing- WBC down to 9.7- con't to monitor  9/6- started on Keflex yesterday for incision- looks better today- also will check U/A since WBC up to 12.1- has been 4 days since checked- could even be (-) since keflex started- will monitor- of note Tm=Tc 99.4.   9/7- completely afebrile- will recheck labs iN AM  9/11 wbc's 11.9 most recently, ucx negative, wound stable. afebrile - Cefdinir per d/w pharmacy-continue for now.  12. Pre-diabetes: Hyperglycemia  due to increase in steroids. Will monitor FBS for now.  13. Hypocalcemia: Add Calcium supplement.  14. Constipation: Will add Senna S two tabs with supper. Add Sorbitol   9/5- had good BM- con't regimen for now- yesterday and this AM.  15. Urinary retention: Has not urinated since foley came out early am/last pm? ?neurogenic bladder.             --will check voiding with PVR checks. Work on constipation relief  9/5- checking Bladder scans and cathing for ~ 400cc- cannot start Flomax- had seizures with Sulfa- will d/w Urology tomorrow  9/6- will start Bethanachol 10 mg TID for now. And treat for possible UTI?  9/7- has UTI- based on U/A- mod leuks and (+) nitrites  9/8- Change PO Abx to Cefdinir per Pharmacy and if not better clinically tomorrow, will broaden coverage  9/9- WBC down slightly to 11.9- will con't to monitor- Cx showed multiple species.   9/11 consistently requiring I/O caths 16. Low albumin: add Juven to promote wound healing 17. Hyponatremia  9/7- Na 129- will fluid restrict to 1800cc/day and recheck in AM- BUN 31- so might need IVFs. 9/8- Na up to 130 today- BUN also down to 25, so doing better- will wait for now on IVFs but recheck in AM   9/9- BUN 26- and Cr 0.50- will recheck Monday unless gets sick- then will need more labs over weekend.   9/13- Na up to 134- con't to monitor and BUN down to 16- doing better    LOS: 11 days [place]  09/28/2020, 9:05 AM

## 2020-09-28 NOTE — Progress Notes (Signed)
Physical Therapy Session Note  Patient Details  Name: Haley Roberts MRN: [id number] Date of Birth: [birth year]  Today's Date: 09/28/2020 PT Individual Time: 0900-1000, 1100-1130 PT Individual Time Calculation (min): 60 min, 30 min   Short Term Goals: Week 1:  PT Short Term Goal 1 (Week 1): Pt will perform supine<>sit with +2 max assist PT Short Term Goal 1 - Progress (Week 1): Met PT Short Term Goal 2 (Week 1): Pt will perform bed<>chair transfers using LRAD with +2 max assist PT Short Term Goal 2 - Progress (Week 1): Met PT Short Term Goal 3 (Week 1): Pt will tolerate upright, OOB sitting for at least 1 hour between therapy sessions PT Short Term Goal 3 - Progress (Week 1): Met PT Short Term Goal 4 (Week 1): Pt will recall 2/3 spine precautions without cuing PT Short Term Goal 4 - Progress (Week 1): Met PT Short Term Goal 5 (Week 1): Pt will propel wheelchair 31f with min assist PT Short Term Goal 5 - Progress (Week 1): Not met  Skilled Therapeutic Interventions/Progress Updates:    Session 1: Pt seated in TIS chair on arrival with no c/o pain at this time. Pt transported to therapy gym and performed slide board transfer with mod A of 2, progressing to mod of 1 by end of session. Pt unable to use UE to lift leg to assist with positioning SB at this time, but was able to perform lateral leans with less cueing. Pt performed transfer TIS>mat>w/c>mat>w/c, including going toward L side to mat table, which she has previously not attempted and slightly up hill. Pt continues to require assist at her LLE to prevent extensor tone in order to transfer. Pt was able to assess and correct her positioning when given questioning cues, but still needs max encouragement and min facilitation to achieve anterior weight shift. Attempted to propel manual w/c but will require theraband for good grip due to premorbid RA. Pt returned to room and remained seated in w/c. Pt was left with all needs in reach and alarm  active.   Session 2: Pt seated in w/c on arrival and agreeable to therapy. No complaint of pain. Pt transported to therapy gym and therapist applied theraband to push rims to assist with grip. Pt then propelled chair with BUE 2 x 100 ft with short rest break and up to 125 ft in one bout. Pt also navigated obstacles spaced about 4' ft apart in hall way. Pt required frequent multimodal cueing for navigation throughout session. Pt requested to return to bed at end of session. Mod A slide board transfer in same manner as previous session. Pt remained in bed at end of session and was left with all needs in reach and alarm active.   Therapy Documentation Precautions:  Precautions Precautions: Fall, Other (comment) Precaution Comments: Fear of falls, anxiety, back precautions (pt unable to verbalize any of 3) Required Braces or Orthoses: Knee Immobilizer - Right Knee Immobilizer - Right: On at all times Restrictions Weight Bearing Restrictions: Yes RLE Weight Bearing: Non weight bearing   Therapy/Group: Individual Therapy  OMickel Fuchs9/13/2022, 12:37 PM

## 2020-09-28 NOTE — Telephone Encounter (Signed)
Dr. Dagoberto Ligas is on pal for the next 3 day.   Will you please call Haley Roberts in Dr. Florentina Jenny absence? He is very concern about his mother's current health condition. His phone number is [phone]. She is currently an in-patient.   Thank you.

## 2020-09-29 DIAGNOSIS — N390 Urinary tract infection, site not specified: Secondary | ICD-10-CM | POA: Diagnosis not present

## 2020-09-29 DIAGNOSIS — I10 Essential (primary) hypertension: Secondary | ICD-10-CM | POA: Diagnosis not present

## 2020-09-29 DIAGNOSIS — E871 Hypo-osmolality and hyponatremia: Secondary | ICD-10-CM

## 2020-09-29 DIAGNOSIS — Z9889 Other specified postprocedural states: Secondary | ICD-10-CM | POA: Diagnosis not present

## 2020-09-29 NOTE — Progress Notes (Signed)
Occupational Therapy Session Note  Patient Details  Name: Haley Roberts MRN: [id number] Date of Birth: 07-30-1945  Today's Date: 09/29/2020 OT Group Time: 1330-1430 OT Group Time Calculation (min): 60 min   Short Term Goals: Week 2:  OT Short Term Goal 1 (Week 2): Pt will complete slideboard transfers consistently with 1 assist in prep for [facility] transfer OT Short Term Goal 2 (Week 2): Pt will complete 2 grooming tasks sitting EOB with no more than supervision for balance assistance OT Short Term Goal 3 (Week 2): Pt will complete UB dressing with Min A while sitting unsupported  Skilled Therapeutic Interventions/Progress Updates:  Pt participated in group session with a focus on stress mgmt, education on healthy coping strategies, and social interaction. Focus of session on providing coping strategies to manage new current level of function as a result of new diagnosis.  Session focus on breaking down stressors into "daily hassles," "major life stressors" and "life circumstances" in an effort to allow pts to chunk their stressors into groups. Pt actively sharing stressors and contributing to group conversation. Offered education on factors that protect [place] against stress such as "daily uplifts," "healthy coping strategies" and "protective factors." Encouraged all group members to make an effort to actively recall one event from their day that was a daily uplift in an effort to protect their mindset from stressors. Issued pt handouts on healthy coping strategies to implement into routine. Pt handed off to PT for next session.   Therapy Documentation Precautions:  Precautions Precautions: Fall, Other (comment) Precaution Comments: Fear of falls, anxiety, back precautions (pt unable to verbalize any of 3) Required Braces or Orthoses: Knee Immobilizer - Right Knee Immobilizer - Right: On at all times Restrictions Weight Bearing Restrictions: Yes RLE Weight Bearing: Non weight bearing     Pain: Pt reports no pain during session.    Therapy/Group: Group Therapy  Precious Haws 09/29/2020, 3:48 PM

## 2020-09-29 NOTE — Progress Notes (Signed)
Physical Therapy Session Note  Patient Details  Name: Haley Roberts MRN: [id number] Date of Birth: 27-Feb-1945  Today's Date: 09/29/2020 PT Individual Time: 1435-1530 PT Individual Time Calculation (min): 55 min   Short Term Goals: Week 1:  PT Short Term Goal 1 (Week 1): Pt will perform supine<>sit with +2 max assist PT Short Term Goal 1 - Progress (Week 1): Met PT Short Term Goal 2 (Week 1): Pt will perform bed<>chair transfers using LRAD with +2 max assist PT Short Term Goal 2 - Progress (Week 1): Met PT Short Term Goal 3 (Week 1): Pt will tolerate upright, OOB sitting for at least 1 hour between therapy sessions PT Short Term Goal 3 - Progress (Week 1): Met PT Short Term Goal 4 (Week 1): Pt will recall 2/3 spine precautions without cuing PT Short Term Goal 4 - Progress (Week 1): Met PT Short Term Goal 5 (Week 1): Pt will propel wheelchair 38f with min assist PT Short Term Goal 5 - Progress (Week 1): Not met Week 2:  PT Short Term Goal 1 (Week 2): Pt will perform bed<>chair transfer with asisst of 1 PT Short Term Goal 2 (Week 2): Pt will initate standing mobility PT Short Term Goal 3 (Week 2): Pt will initiate w/c propulsion training  Skilled Therapeutic Interventions/Progress Updates:    Pt seated in w/c in gym after group session. No complaint of pain. Session focused on initiating therapeutic standing. Pt performed Sit to stand x 7 in // bars, with max A +2 fading to mod A of 1. Pt required knee block and stabilization of L ankle to prevent inversion, as well as max verbal encouragement/cues to maintain NWB on RLE and extend hip. Pt required extended rest breaks between attempts d/t fatigue. Pt reported that she had minor urinary incontinence while standing. Pt propelled w/c with BUE x 100 ft for improved functional mobility and endurance. Pt required max A for SB transfer d/t fatigue. Rolling with min A to CGA at times for brief change and to don/doff pants. Pt requested to use bed  pan, small continent bowel and bladder void noted. Pt remained in bed at end of session and was left with all needs in reach and alarm active.   Therapy Documentation Precautions:  Precautions Precautions: Fall, Other (comment) Precaution Comments: Fear of falls, anxiety, back precautions (pt unable to verbalize any of 3) Required Braces or Orthoses: Knee Immobilizer - Right Knee Immobilizer - Right: On at all times Restrictions Weight Bearing Restrictions: Yes RLE Weight Bearing: Non weight bearing    Therapy/Group: Individual Therapy  OMickel Fuchs9/14/2022, 3:45 PM

## 2020-09-29 NOTE — Progress Notes (Signed)
PROGRESS NOTE   Subjective/Complaints: Patient seen sitting up in her chair this morning.  She states she slept well overnight.  She is working with therapies.  Discussed need for assistance at discharge with therapies.  Received message from office regarding son having questions, called, no answer, left voicemail and callback number.  She notes that she has been slightly anxious because of her family situation.    ROS: Denies CP, SOB, N/V/D  Objective:   No results found. Recent Labs    09/27/20 0654  WBC 9.2  HGB 9.2*  HCT 29.4*  PLT 653*    Recent Labs    09/27/20 0654  NA 134*  K 3.7  CL 97*  CO2 27  GLUCOSE 94  BUN 16  CREATININE 0.59  CALCIUM 9.3     Intake/Output Summary (Last 24 hours) at 09/29/2020 1123 Last data filed at 09/29/2020 0803 Gross per 24 hour  Intake 720 ml  Output --  Net 720 ml         Physical Exam: Vital Signs Blood pressure 114/65, pulse 68, temperature 98 F (36.7 C), temperature source Oral, resp. rate 16, height '5\' 2"'$  (1.575 m), weight 66 kg, SpO2 96 %. Constitutional: No distress . Vital signs reviewed. HENT: Normocephalic.  Atraumatic. Eyes: EOMI. No discharge. Cardiovascular: No JVD.  RRR. Respiratory: Normal effort.  No stridor.  Bilateral clear to auscultation. GI: Non-distended.  BS +. Skin: Warm and dry.  Intact. Psych: Normal mood.  Normal behavior. Musc: No edema in extremities.  No tenderness in extremities. Artery changes to bilateral hands and feet Neurological: Alert and oriented Motor: 4/5 bilateral upper extremities Right lower extremity: Hip flexion 1/5, knee brace, ankle dorsiflexion 0/5 Left lower extremity: 4/5 proximal distal   Assessment/Plan: 1. Functional deficits which require 3+ hours per day of interdisciplinary therapy in a comprehensive inpatient rehab setting. Physiatrist is providing close team supervision and 24 hour management of active  medical problems listed below. Physiatrist and rehab team continue to assess barriers to discharge/monitor patient progress toward functional and medical goals  Care Tool:  Bathing    Body parts bathed by patient: Right arm, Left arm, Chest, Abdomen, Right upper leg, Left upper leg, Face   Body parts bathed by helper: Buttocks, Right lower leg, Left lower leg, Front perineal area     Bathing assist Assist Level: Maximal Assistance - Patient 24 - 49%     Upper Body Dressing/Undressing Upper body dressing   What is the patient wearing?: Pull over shirt    Upper body assist Assist Level: Minimal Assistance - Patient > 75%    Lower Body Dressing/Undressing Lower body dressing      What is the patient wearing?: Pants     Lower body assist Assist for lower body dressing: Total Assistance - Patient < 25%     Toileting Toileting    Toileting assist Assist for toileting: 2 Helpers     Transfers Chair/bed transfer  Transfers assist  Chair/bed transfer activity did not occur: Safety/medical concerns  Chair/bed transfer assist level: Moderate Assistance - Patient 50 - 74%     Locomotion Ambulation   Ambulation assist   Ambulation activity did  not occur: Safety/medical concerns          Walk 10 feet activity   Assist  Walk 10 feet activity did not occur: Safety/medical concerns        Walk 50 feet activity   Assist Walk 50 feet with 2 turns activity did not occur: Safety/medical concerns         Walk 150 feet activity   Assist Walk 150 feet activity did not occur: Safety/medical concerns         Walk 10 feet on uneven surface  activity   Assist Walk 10 feet on uneven surfaces activity did not occur: Safety/medical concerns         Wheelchair     Assist Is the patient using a wheelchair?: Yes Type of Wheelchair: Manual Wheelchair activity did not occur: Safety/medical concerns  Wheelchair assist level: Set up assist Max  wheelchair distance: 125'    Wheelchair 50 feet with 2 turns activity    Assist    Wheelchair 50 feet with 2 turns activity did not occur: Safety/medical concerns   Assist Level: Contact Guard/Touching assist   Wheelchair 150 feet activity     Assist  Wheelchair 150 feet activity did not occur: Safety/medical concerns       Medical Problem List and Plan: 1.  Functional and mobility deficits secondary to lumbar stenosis with previous supracondylar right femur fx and right calcaneus fx after fall  Cont CIR 2.  Antithrombotics: -DVT/anticoagulation:  Mechanical: Sequential compression devices, below knee Bilateral lower extremities  9/8- on Lovenox daily             -antiplatelet therapy: N/A 3. Pain Management: Used hydrocodone 10 mg TID PTA. On prednisone taper with Oxycontin 10 mg bid and oxycodone prn.              --Flexeril TID, Gabapentin   9/5- just had med changes- is tolerable enough to do therapy, so won't change yet- pt said she doesn't think it's much different- pain wise- fyi  Flexeril 10 mg TID and Lidoderm patches to neck 8pm to 8am  Controlled on 9/14 4. Mood: Team to provide ego support. [name] 5 mg TID for anxiety Valium 5 mg q56 hours prn for spasms, but could help anxiety?  Improving on 9/14             -antipsychotic agents: N/A 5. Neuropsych: This patient is capable of making decisions on her own behalf. 6. Skin/Wound Care: Routine pressure relief measures. Monitor incision for healing.  7. Fluids/Electrolytes/Nutrition:  Monitor I/O.  KCL 10 mEq daily since on HCTZ  Added megace 9/13- prealbumin is 11.2 8. HTN: Monitor BP TID--continue HCTZ and Cozaar  Controlled on 9/14 9. RA: Continue Arava daily with chronic prednisone (higher doses with taper currently).  10. H/o depression: Continue Wellbutrin and Lexapro.  11. Leukocytosis:  Continue cefdinir  12. Pre-diabetes: Hyperglycemia due to increase in steroids. Will monitor FBS for now.  13.  Hypocalcemia: Added Calcium supplement.  14. Constipation: Added senna S two tabs with supper. Added Sorbitol   Improving on 9/14 15. Urinary retention:  Cannot start Flomax- had seizures with Sulfa Started Bethanachol 10 mg TID for now as well as treatment for UTI 16. Low albumin: add Juven to promote wound healing 17. Hyponatremia  Sodium 134 on 9/12  Continue to monitor  LOS: 12 days A FACE TO FACE EVALUATION WAS PERFORMED  Camren Lipsett [name] 09/29/2020, 11:23 AM

## 2020-09-29 NOTE — Progress Notes (Signed)
Occupational Therapy Session Note  Patient Details  Name: Haley Roberts MRN: [id number] Date of Birth: 06/04/1945  Today's Date: 09/29/2020 OT Individual Time: 0830-0939 OT Individual Time Calculation (min): 69 min    Short Term Goals: Week 1:  OT Short Term Goal 1 (Week 1): Pt will sit EOB wiht CGA for sitting balance for 5 min OT Short Term Goal 1 - Progress (Week 1): Met OT Short Term Goal 2 (Week 1): Pt will SB consistently wiht MOD A to w/c in prep for BSC transfers OT Short Term Goal 2 - Progress (Week 1): Progressing toward goal OT Short Term Goal 3 (Week 1): Pt will roll in bed wiht MOD A to decrease BOC for LB dressing OT Short Term Goal 3 - Progress (Week 1): Met OT Short Term Goal 4 (Week 1): Pt will recall 3/3 back precautions wiht use of external aides as needed OT Short Term Goal 4 - Progress (Week 1): Met Week 2:  OT Short Term Goal 1 (Week 2): Pt will complete slideboard transfers consistently with 1 assist in prep for BSC transfer OT Short Term Goal 2 (Week 2): Pt will complete 2 grooming tasks sitting EOB with no more than supervision for balance assistance OT Short Term Goal 3 (Week 2): Pt will complete UB dressing with Min A while sitting unsupported   Skilled Therapeutic Interventions/Progress Updates:    Pt greeted at time of session semireclined in bed finishing up med pass. No pain resting but did have slight discomfort sitting on BSC later in session which resolved with positional change and rest. Pt politely declined LB bathe/dress as nursing helped change this morning after accident. Supine > sit Max A using gait belt as leg lifter for RLE. Note KI on throughout session and reiterated back precautions throughout. Sitting EOB Min initially fading to Supervision. Slide board transfer bed > DABSC with Min/Mod A and 2nd helper for safety, dependent for board placement. Rest break seated on BSC and attempted to lift buttocks and lateral leans to simulate clothing  management but unable today. Slide board back to bed Mod/Max from BSC as pt with significant difficulty initiating transfer and getting over initial bump on to board. EOB, slide board bed > wheelchair Mod A and 2nd helper for safety. Set up at sink for oral hygiene and UB dressing with assist only to pull down in back and open container d/t arthritic changes. Set up in chair alarm on call bell in reach.   Therapy Documentation Precautions:  Precautions Precautions: Fall, Other (comment) Precaution Comments: Fear of falls, anxiety, back precautions (pt unable to verbalize any of 3) Required Braces or Orthoses: Knee Immobilizer - Right Knee Immobilizer - Right: On at all times Restrictions Weight Bearing Restrictions: Yes RLE Weight Bearing: Non weight bearing    Therapy/Group: Individual Therapy   C  09/29/2020, 7:20 AM 

## 2020-09-29 NOTE — Progress Notes (Signed)
Calorie Count Note  48 hour calorie count ordered.  Diet: Regular diet with thin liquids 1800 ml fluids Supplements: Ensure Enlive po TID, each supplement provides 350 kcal and 20 grams of protein. Juven BID, each packet provides 95 calories, 2.5 grams of protein.  Breakfast: 160 kcal, 7 grams of protein Lunch: 171 kcal, 7 grams of protein Dinner: 430 kcal, 14 grams of protein Supplements: 540 kcal, 25 grams of protein  Estimated Nutritional Needs:  Kcal:  1650-1850 Protein:  80-90 grams Fluid:  >/= 1.6 L/day  Day 1 Total intake: 1301 kcal (79% of kcal needs)  53 grams of protein (66% of protein needs)  Nutrition Dx: Inadequate oral intake related to nausea as evidenced by per patient/family report; progressing  Goal:  Patient will meet greater than or equal to 90% of their needs; progressing  Intervention:  Continue Ensure Enlive po TID, each supplement provides 350 kcal and 20 grams of protein.   Continue Juven BID, each packet provides 95 calories, 2.5 grams of protein.   Encourage adequate PO intake.   Corrin Parker, MS, RD, LDN RD pager number/after hours weekend pager number on Amion.

## 2020-09-29 NOTE — Progress Notes (Signed)
Physical Therapy Session Note  Patient Details  Name: Haley Roberts MRN: [id number] Date of Birth: 08-25-1945  Today's Date: 09/29/2020 PT Individual Time: 1030-1055 PT Individual Time Calculation (min): 25 min   Short Term Goals: Week 2:  PT Short Term Goal 1 (Week 2): Pt will perform bed<>chair transfer with asisst of 1 PT Short Term Goal 2 (Week 2): Pt will initate standing mobility PT Short Term Goal 3 (Week 2): Pt will initiate w/c propulsion training  Skilled Therapeutic Interventions/Progress Updates:     Pt seen sitting in w/c at start of session - agreeable to therapy. She reports no pain during session. Worked on w/c mobility in room and around rehab gyms. She required minA for propelling herself in tight spaces, navigating doorways, and avoiding obstacles. She has difficulty with turns and coordinating BUE's to manage w/c at times. She propelled herself in open hallways with supervision, ~110f. Required totalA for sliding board transfer from w/c to EOB, towards her stronger L side. She struggles with facilitating forward weight shift and initiation hip translation to assist with the transfer. She required maxA for sit>supine for BLE management with HOB flat and use of bed rail. TotalA for scooting up in the bed for repositioning. Remained semi-reclined in bed with bed alarm on and all needs within reach at completion of session.   Therapy Documentation Precautions:  Precautions Precautions: Fall, Other (comment) Precaution Comments: Fear of falls, anxiety, back precautions (pt unable to verbalize any of 3) Required Braces or Orthoses: Knee Immobilizer - Right Knee Immobilizer - Right: On at all times Restrictions Weight Bearing Restrictions: Yes RLE Weight Bearing: Non weight bearing General:    Therapy/Group: Individual Therapy  Jaelene Garciagarcia P Tashanti Dalporto PT 09/29/2020, 7:51 AM

## 2020-09-30 DIAGNOSIS — M79604 Pain in right leg: Secondary | ICD-10-CM | POA: Diagnosis not present

## 2020-09-30 DIAGNOSIS — E871 Hypo-osmolality and hyponatremia: Secondary | ICD-10-CM | POA: Diagnosis not present

## 2020-09-30 DIAGNOSIS — N390 Urinary tract infection, site not specified: Secondary | ICD-10-CM | POA: Diagnosis not present

## 2020-09-30 DIAGNOSIS — Z9889 Other specified postprocedural states: Secondary | ICD-10-CM | POA: Diagnosis not present

## 2020-09-30 NOTE — Progress Notes (Signed)
Calorie Count Note  48 hour calorie count ordered.  Diet: Regular diet with thin liquids 1800 ml fluids Supplements: Ensure Enlive po TID, each supplement provides 350 kcal and 20 grams of protein. Juven BID, each packet provides 95 calories, 2.5 grams of protein.  Breakfast: 160 kcal, 28 grams of protein Lunch: 590 kcal, 22 grams of protein Dinner: 231 kcal, 7 grams of protein Supplements: 700 kcal, 40 grams of protein  Day 2 Total intake: 1681 kcal (100% of kcal needs)  97 grams of protein (100% of protein needs)  Estimated Nutritional Needs:  Kcal:  1650-1850 Protein:  80-90 grams Fluid:  >/= 1.6 L/day  Meal completion has been 25-100% with 100% at lunch today. Pt currently has Ensure and Juven ordered and has been consuming them. RD to continue with current orders to aid in caloric and protein needs.   Nutrition Dx: Inadequate oral intake related to nausea as evidenced by per patient/family report; progressing  Goal:  Patient will meet greater than or equal to 90% of their needs; met  Intervention:  Continue Ensure Enlive po TID, each supplement provides 350 kcal and 20 grams of protein.   Continue Juven BID, each packet provides 95 calories, 2.5 grams of protein.   Encourage adequate PO intake.    , MS, RD, LDN RD pager number/after hours weekend pager number on Amion.   

## 2020-09-30 NOTE — Progress Notes (Signed)
Occupational Therapy Session Note  Patient Details  Name: Haley Roberts MRN: [id number] Date of Birth: 1945-05-03  Today's Date: 09/30/2020 OT Individual Time: 6468-0321 and 1102-1200 OT Individual Time Calculation (min): 54 min and 58 min   Short Term Goals: Week 1:  OT Short Term Goal 1 (Week 1): Pt will sit EOB wiht CGA for sitting balance for 5 min OT Short Term Goal 1 - Progress (Week 1): Met OT Short Term Goal 2 (Week 1): Pt will SB consistently wiht MOD A to w/c in prep for BSC transfers OT Short Term Goal 2 - Progress (Week 1): Progressing toward goal OT Short Term Goal 3 (Week 1): Pt will roll in bed wiht MOD A to decrease BOC for LB dressing OT Short Term Goal 3 - Progress (Week 1): Met OT Short Term Goal 4 (Week 1): Pt will recall 3/3 back precautions wiht use of external aides as needed OT Short Term Goal 4 - Progress (Week 1): Met Week 2:  OT Short Term Goal 1 (Week 2): Pt will complete slideboard transfers consistently with 1 assist in prep for [facility] transfer OT Short Term Goal 2 (Week 2): Pt will complete 2 grooming tasks sitting EOB with no more than supervision for balance assistance OT Short Term Goal 3 (Week 2): Pt will complete UB dressing with Min A while sitting unsupported   Skilled Therapeutic Interventions/Progress Updates:    Session 1: Pt greeted at time of session semireclined in bed resting, no pain resting but did have low back pain with mobility and movement, RN present during session and provided pain meds. Focus of session on LB bathing bed level for BLEs using LHS Max A overall, declined washing buttocks/periarea but reviewed how the pt would do this at home and receptive. LB dressing with pt placing KI first dependently, donned pants only with assist for RLE and reacher for LLE able to bend knee to bring closer to self. Rolling L/R with Min A for pt to grab top of pants to help pull over hips, difficulty d/t arthritic fingers. Supine > sit Max A and slide  board Min A bed > wheelchair after placement and extended time to scoot. Set up at sink for oral hygiene and face washing and set up in wheelchair alarm on call bell in reach.    Session 2: Pt greeted at time of session sitting up in wheelchair, no pain reported except during slide board transfer which resolved with positional change. Pt stating she dropped her cup and spilled contents, provided pt with handled cup and pt reports improvement in being able to hold cup for PO intake. Slide board wheelchair > MIN and back to wheelchair with Min/Mod mainly for initial facilitation on to board and extended time for scooting. Sit <> supine Mod/Max with cues for spinal precautions. Rolling L/R with Min A for bed pan placement and assist for clothing management. Pt did have BM, noted to have whole pill in contents and relayed to nursing. Slide board back to chair, alarm on call bell in reach.   Therapy Documentation Precautions:  Precautions Precautions: Fall, Other (comment) Precaution Comments: Fear of falls, anxiety, back precautions (pt unable to verbalize any of 3) Required Braces or Orthoses: Knee Immobilizer - Right Knee Immobilizer - Right: On at all times Restrictions Weight Bearing Restrictions: Yes RLE Weight Bearing: Non weight bearing    Therapy/Group: Individual Therapy  [name] 09/30/2020, 7:11 AM

## 2020-09-30 NOTE — Progress Notes (Signed)
Occupational Therapy Session Note  Patient Details  Name: Haley Roberts MRN: [id number] Date of Birth: May 23, 1945  Today's Date: 09/30/2020 OT Individual Time: 6834-1962 OT Individual Time Calculation (min): 12 min    Short Term Goals: Week 1:  OT Short Term Goal 1 (Week 1): Pt will sit EOB wiht CGA for sitting balance for 5 min OT Short Term Goal 1 - Progress (Week 1): Met OT Short Term Goal 2 (Week 1): Pt will SB consistently wiht MOD A to w/c in prep for BSC transfers OT Short Term Goal 2 - Progress (Week 1): Progressing toward goal OT Short Term Goal 3 (Week 1): Pt will roll in bed wiht MOD A to decrease BOC for LB dressing OT Short Term Goal 3 - Progress (Week 1): Met OT Short Term Goal 4 (Week 1): Pt will recall 3/3 back precautions wiht use of external aides as needed OT Short Term Goal 4 - Progress (Week 1): Met Week 2:  OT Short Term Goal 1 (Week 2): Pt will complete slideboard transfers consistently with 1 assist in prep for [facility] transfer OT Short Term Goal 2 (Week 2): Pt will complete 2 grooming tasks sitting EOB with no more than supervision for balance assistance OT Short Term Goal 3 (Week 2): Pt will complete UB dressing with Min A while sitting unsupported  Skilled Therapeutic Interventions/Progress Updates:    Pt greeted at time of additional session sitting up in wheelchair, needing to get back to bed. After placement of slide board under LLE, pt noted to slide buttocks forward into poor positioning. Removed slide board and repositioned total A to bring hips back into chair. Replaced slide board and Mod A to initially get on to board, pt able to assist by slowly scooting most of the way to bed, but fatigued and needed Max A at end of transfer to get to bed. Sit > supine Mod A for BLE management. Hand off to nursing.   Therapy Documentation Precautions:  Precautions Precautions: Fall, Other (comment) Precaution Comments: Fear of falls, anxiety, back precautions (pt  unable to verbalize any of 3) Required Braces or Orthoses: Knee Immobilizer - Right Knee Immobilizer - Right: On at all times Restrictions Weight Bearing Restrictions: Yes RLE Weight Bearing: Non weight bearing     Therapy/Group: Individual Therapy  [name] 09/30/2020, 4:44 PM

## 2020-09-30 NOTE — Progress Notes (Signed)
Physical Therapy Session Note  Patient Details  Name: Haley Roberts MRN: [id number] Date of Birth: [birth year]  Today's Date: 09/30/2020 PT Individual Time: 0900-1000 PT Individual Time Calculation (min): 60 min   Short Term Goals: Week 1:  PT Short Term Goal 1 (Week 1): Pt will perform supine<>sit with +2 max assist PT Short Term Goal 1 - Progress (Week 1): Met PT Short Term Goal 2 (Week 1): Pt will perform bed<>chair transfers using LRAD with +2 max assist PT Short Term Goal 2 - Progress (Week 1): Met PT Short Term Goal 3 (Week 1): Pt will tolerate upright, OOB sitting for at least 1 hour between therapy sessions PT Short Term Goal 3 - Progress (Week 1): Met PT Short Term Goal 4 (Week 1): Pt will recall 2/3 spine precautions without cuing PT Short Term Goal 4 - Progress (Week 1): Met PT Short Term Goal 5 (Week 1): Pt will propel wheelchair 40f with min assist PT Short Term Goal 5 - Progress (Week 1): Not met  Skilled Therapeutic Interventions/Progress Updates:    Pt seated in w/c on arrival and agreeable to therapy. No complaint of pain. Pt propelled w/c with BUE x 100 ft for improved functional mobility and endurance. Pt then performed SB transfer to mat table with min A to stabilize board and RLE. Pt then engaged in part practice of scoot transfers for improved independence with transfers. Pt had difficulty maintaining anterior weight shift and had multiple onsets of anxiety related and fear of falling, resolved with deep breathing and grounding technique. Pt had improved success with approximation force at the upper femur and pelvis while utilizing NDT facilitation techniques for anterior weight shift and small lift. Pt was unable to clear buttocks during multiple attempts, but was able to get clear muscle activation in LLE with each attempt. SB transfer with mod-max A d/t attempting new technique to achieve lift during each scoot. Pt reported intense fatigue after transfer, was  transported back to room and opted to remain in w/c, was left with all needs in reach and alarm active.   Therapy Documentation Precautions:  Precautions Precautions: Fall, Other (comment) Precaution Comments: Fear of falls, anxiety, back precautions (pt unable to verbalize any of 3) Required Braces or Orthoses: Knee Immobilizer - Right Knee Immobilizer - Right: On at all times Restrictions Weight Bearing Restrictions: Yes RLE Weight Bearing: Non weight bearing    Therapy/Group: Individual Therapy  OMickel Fuchs9/15/2022, 12:52 PM

## 2020-09-30 NOTE — Progress Notes (Signed)
PROGRESS NOTE   Subjective/Complaints: Patient seen sitting up in her chair this morning.  She states she slept well overnight.  She is more calm this morning.  She is questions about leg improvement.  ROS: Denies CP, SOB, N/V/D  Objective:   No results found. No results for input(s): WBC, HGB, HCT, PLT in the last 72 hours.  No results for input(s): NA, K, CL, CO2, GLUCOSE, BUN, CREATININE, CALCIUM in the last 72 hours.   Intake/Output Summary (Last 24 hours) at 09/30/2020 1040 Last data filed at 09/30/2020 0254 Gross per 24 hour  Intake 140 ml  Output 500 ml  Net -360 ml         Physical Exam: Vital Signs Blood pressure 138/77, pulse 86, temperature 98.2 F (36.8 C), temperature source Oral, resp. rate 18, height '5\' 2"'$  (1.575 m), weight 66 kg, SpO2 96 %. Constitutional: No distress . Vital signs reviewed. HENT: Normocephalic.  Atraumatic. Eyes: EOMI. No discharge. Cardiovascular: No JVD.  RRR. Respiratory: Normal effort.  No stridor.  Bilateral clear to auscultation. GI: Non-distended.  BS +. Skin: Warm and dry.  Intact. Psych: Normal mood.  Normal behavior. Musc: No edema in extremities.  No tenderness in extremities. Rheumatoid arthritis changes to bilateral hands and feet Neurological: Alert and oriented Motor: 4/5 bilateral upper extremities Right lower extremity: Hip flexion 1/5, knee brace, ankle dorsiflexion 0/5, stable Left lower extremity: 4/5 proximal distal   Assessment/Plan: 1. Functional deficits which require 3+ hours per day of interdisciplinary therapy in a comprehensive inpatient rehab setting. Physiatrist is providing close team supervision and 24 hour management of active medical problems listed below. Physiatrist and rehab team continue to assess barriers to discharge/monitor patient progress toward functional and medical goals  Care Tool:  Bathing    Body parts bathed by patient: Right  arm, Left arm, Chest, Abdomen, Right upper leg, Left upper leg, Face, Right lower leg, Left lower leg   Body parts bathed by helper: Front perineal area, Buttocks     Bathing assist Assist Level: Moderate Assistance - Patient 50 - 74%     Upper Body Dressing/Undressing Upper body dressing   What is the patient wearing?: Pull over shirt    Upper body assist Assist Level: Minimal Assistance - Patient > 75%    Lower Body Dressing/Undressing Lower body dressing      What is the patient wearing?: Pants     Lower body assist Assist for lower body dressing: Maximal Assistance - Patient 25 - 49%     Toileting Toileting    Toileting assist Assist for toileting: 2 Helpers     Transfers Chair/bed transfer  Transfers assist  Chair/bed transfer activity did not occur: Safety/medical concerns  Chair/bed transfer assist level: Moderate Assistance - Patient 50 - 74%     Locomotion Ambulation   Ambulation assist   Ambulation activity did not occur: Safety/medical concerns          Walk 10 feet activity   Assist  Walk 10 feet activity did not occur: Safety/medical concerns        Walk 50 feet activity   Assist Walk 50 feet with 2 turns activity did not occur:  Safety/medical concerns         Walk 150 feet activity   Assist Walk 150 feet activity did not occur: Safety/medical concerns         Walk 10 feet on uneven surface  activity   Assist Walk 10 feet on uneven surfaces activity did not occur: Safety/medical concerns         Wheelchair     Assist Is the patient using a wheelchair?: Yes Type of Wheelchair: Manual Wheelchair activity did not occur: Safety/medical concerns  Wheelchair assist level: Set up assist Max wheelchair distance: 125'    Wheelchair 50 feet with 2 turns activity    Assist    Wheelchair 50 feet with 2 turns activity did not occur: Safety/medical concerns   Assist Level: Contact Guard/Touching assist    Wheelchair 150 feet activity     Assist  Wheelchair 150 feet activity did not occur: Safety/medical concerns       Medical Problem List and Plan: 1.  Functional and mobility deficits secondary to lumbar stenosis with previous supracondylar right femur fx and right calcaneus fx after fall  Continue CIR 2.  Antithrombotics: -DVT/anticoagulation:  Mechanical: Sequential compression devices, below knee Bilateral lower extremities  9/8- on Lovenox daily             -antiplatelet therapy: N/A 3. Pain Management: Used hydrocodone 10 mg TID PTA. On prednisone taper with Oxycontin 10 mg bid and oxycodone prn.              --Flexeril TID, Gabapentin   Flexeril 10 mg TID and Lidoderm patches to neck 8pm to 8am  Controlled on 9/15 4. Mood: Team to provide ego support. [name] 5 mg TID for anxiety Valium 5 mg q56 hours prn for spasms, but could help anxiety?  Improving on 9/15             -antipsychotic agents: N/A 5. Neuropsych: This patient is capable of making decisions on her own behalf. 6. Skin/Wound Care: Routine pressure relief measures. Monitor incision for healing.  7. Fluids/Electrolytes/Nutrition:  Monitor I/O.  KCL 10 mEq daily since on HCTZ  Added megace 9/13- prealbumin is 11.2 8. HTN: Monitor BP TID--continue HCTZ and Cozaar  Controlled on 9/15 9. RA: Continue Arava daily with chronic prednisone (higher doses with taper currently).  10. H/o depression: Continue Wellbutrin and Lexapro.  11. Leukocytosis:  Completed course of cefdinir  12. Pre-diabetes: Hyperglycemia due to increase in steroids. Will monitor FBS for now.  13. Hypocalcemia: Added Calcium supplement.  14. Constipation: Added senna S two tabs with supper. Added Sorbitol   Improving on 9/14 15. Urinary retention:  Cannot start Flomax- had seizures with Sulfa Started Bethanachol 10 mg TID for now as well as treatment for UTI 16. Low albumin: add Juven to promote wound healing 17. Hyponatremia  Sodium 134  on 9/12  Continue to monitor  LOS: 13 days A FACE TO FACE EVALUATION WAS PERFORMED  Luay Balding [name] 09/30/2020, 10:40 AM

## 2020-10-01 DIAGNOSIS — Z9889 Other specified postprocedural states: Secondary | ICD-10-CM | POA: Diagnosis not present

## 2020-10-01 DIAGNOSIS — D62 Acute posthemorrhagic anemia: Secondary | ICD-10-CM | POA: Diagnosis not present

## 2020-10-01 DIAGNOSIS — I10 Essential (primary) hypertension: Secondary | ICD-10-CM | POA: Diagnosis not present

## 2020-10-01 DIAGNOSIS — E871 Hypo-osmolality and hyponatremia: Secondary | ICD-10-CM | POA: Diagnosis not present

## 2020-10-01 MED ORDER — BETHANECHOL CHLORIDE 10 MG PO TABS
10.0000 mg | ORAL_TABLET | Freq: Four times a day (QID) | ORAL | Status: DC
  Administered 2020-10-01 – 2020-10-07 (×24): 10 mg via ORAL
  Filled 2020-10-01 (×24): qty 1

## 2020-10-01 NOTE — Progress Notes (Signed)
Patient ID: Haley Roberts, female   DOB: 1945/07/01, 75 y.o.   MRN: [id number]  SW made attempt to schedule family education with patient son. Left VM for return call. SW will cont to follow up  [name], [place]

## 2020-10-01 NOTE — Progress Notes (Signed)
PROGRESS NOTE   Subjective/Complaints: Patient seen sitting up in bed working with therapy this morning.  She states she slept well overnight.  She has questions again regarding right lower extremity improvement.  She is in good spirits.  ROS: Denies CP, SOB, N/V/D  Objective:   No results found. No results for input(s): WBC, HGB, HCT, PLT in the last 72 hours.  No results for input(s): NA, K, CL, CO2, GLUCOSE, BUN, CREATININE, CALCIUM in the last 72 hours.   Intake/Output Summary (Last 24 hours) at 10/01/2020 1112 Last data filed at 10/01/2020 0700 Gross per 24 hour  Intake 520 ml  Output --  Net 520 ml         Physical Exam: Vital Signs Blood pressure 115/78, pulse 87, temperature 98.9 F (37.2 C), temperature source Oral, resp. rate 18, height '5\' 2"'$  (1.575 m), weight 66 kg, SpO2 99 %. Constitutional: No distress . Vital signs reviewed. HENT: Normocephalic.  Atraumatic. Eyes: EOMI. No discharge. Cardiovascular: No JVD.  RRR. Respiratory: Normal effort.  No stridor.  Bilateral clear to auscultation. GI: Non-distended.  BS +. Skin: Warm and dry.  Intact. Psych: Normal mood.  Normal behavior. Musc: No edema in extremities.  No tenderness in extremities. Rheumatoid arthritis changes to bilateral hands and feet, unchanged Neurological: Alert and oriented Motor: 4/5 bilateral upper extremities Right lower extremity: Hip flexion 1/5, knee brace, ankle dorsiflexion 0/5, unchanged Left lower extremity: 4/5 proximal distal   Assessment/Plan: 1. Functional deficits which require 3+ hours per day of interdisciplinary therapy in a comprehensive inpatient rehab setting. Physiatrist is providing close team supervision and 24 hour management of active medical problems listed below. Physiatrist and rehab team continue to assess barriers to discharge/monitor patient progress toward functional and medical goals  Care  Tool:  Bathing    Body parts bathed by patient: Right arm, Left arm, Chest, Abdomen, Right upper leg, Left upper leg, Face, Right lower leg, Left lower leg   Body parts bathed by helper: Front perineal area, Buttocks     Bathing assist Assist Level: Moderate Assistance - Patient 50 - 74%     Upper Body Dressing/Undressing Upper body dressing   What is the patient wearing?: Pull over shirt    Upper body assist Assist Level: Minimal Assistance - Patient > 75%    Lower Body Dressing/Undressing Lower body dressing      What is the patient wearing?: Pants     Lower body assist Assist for lower body dressing: Maximal Assistance - Patient 25 - 49%     Toileting Toileting    Toileting assist Assist for toileting: Total Assistance - Patient < 25%     Transfers Chair/bed transfer  Transfers assist  Chair/bed transfer activity did not occur: Safety/medical concerns  Chair/bed transfer assist level: Moderate Assistance - Patient 50 - 74%     Locomotion Ambulation   Ambulation assist   Ambulation activity did not occur: Safety/medical concerns          Walk 10 feet activity   Assist  Walk 10 feet activity did not occur: Safety/medical concerns        Walk 50 feet activity   Assist Walk 50  feet with 2 turns activity did not occur: Safety/medical concerns         Walk 150 feet activity   Assist Walk 150 feet activity did not occur: Safety/medical concerns         Walk 10 feet on uneven surface  activity   Assist Walk 10 feet on uneven surfaces activity did not occur: Safety/medical concerns         Wheelchair     Assist Is the patient using a wheelchair?: Yes Type of Wheelchair: Manual Wheelchair activity did not occur: Safety/medical concerns  Wheelchair assist level: Set up assist Max wheelchair distance: 125'    Wheelchair 50 feet with 2 turns activity    Assist    Wheelchair 50 feet with 2 turns activity did not  occur: Safety/medical concerns   Assist Level: Contact Guard/Touching assist   Wheelchair 150 feet activity     Assist  Wheelchair 150 feet activity did not occur: Safety/medical concerns       Medical Problem List and Plan: 1.  Functional and mobility deficits secondary to lumbar stenosis with previous supracondylar right femur fx and right calcaneus fx after fall  Continue CIR 2.  Antithrombotics: -DVT/anticoagulation:  Mechanical: Sequential compression devices, below knee Bilateral lower extremities  9/8- on Lovenox daily             -antiplatelet therapy: N/A 3. Pain Management: Used hydrocodone 10 mg TID PTA. On prednisone taper with Oxycontin 10 mg bid and oxycodone prn.              --Flexeril TID, Gabapentin   Flexeril 10 mg TID and Lidoderm patches to neck 8pm to 8am  Controlled on 9/16 4. Mood: Team to provide ego support. [name] 5 mg TID for anxiety Valium 5 mg q56 hours prn for spasms, but could help anxiety?  Improving on 9/16             -antipsychotic agents: N/A 5. Neuropsych: This patient is capable of making decisions on her own behalf. 6. Skin/Wound Care: Routine pressure relief measures. Monitor incision for healing.  7. Fluids/Electrolytes/Nutrition:  Monitor I/O.  KCL 10 mEq daily since on HCTZ  Added megace 9/13- prealbumin is 11.2 8. HTN: Monitor BP TID--continue HCTZ and Cozaar  Controlled on 9/16 9. RA: Continue Arava daily with chronic prednisone (higher doses with taper currently).  10. H/o depression: Continue Wellbutrin and Lexapro.  11. Leukocytosis:  Completed course of cefdinir  12. Pre-diabetes: Hyperglycemia due to increase in steroids. Will monitor FBS for now.  13. Hypocalcemia: Added Calcium supplement.  14. Constipation: Added senna S two tabs with supper. Added Sorbitol   Improving on 9/14 15. Urinary retention:  Cannot start Flomax- had seizures with Sulfa Started Bethanachol 10 mg TID for now as well as treatment for UTI 16.  Low albumin: add Juven to promote wound healing 17. Hyponatremia  Sodium 134 on 9/12, labs ordered for Monday  Continue to monitor 18.  Acute blood loss anemia  Hemoglobin 9.2 on 9/12, labs ordered for Monday  LOS: 14 days A FACE TO FACE EVALUATION WAS PERFORMED  Haley Roberts Haley Roberts 10/01/2020, 11:12 AM

## 2020-10-01 NOTE — Progress Notes (Signed)
Physical Therapy Session Note  Patient Details  Name: Haley Roberts MRN: [id number] Date of Birth: [birth year]  Today's Date: 10/01/2020 PT Individual Time: 1117-3567; 0141-0301 PT Individual Time Calculation (min): 71 min and 76 min  Short Term Goals: Week 1:  PT Short Term Goal 1 (Week 1): Pt will perform supine<>sit with +2 max assist PT Short Term Goal 1 - Progress (Week 1): Met PT Short Term Goal 2 (Week 1): Pt will perform bed<>chair transfers using LRAD with +2 max assist PT Short Term Goal 2 - Progress (Week 1): Met PT Short Term Goal 3 (Week 1): Pt will tolerate upright, OOB sitting for at least 1 hour between therapy sessions PT Short Term Goal 3 - Progress (Week 1): Met PT Short Term Goal 4 (Week 1): Pt will recall 2/3 spine precautions without cuing PT Short Term Goal 4 - Progress (Week 1): Met PT Short Term Goal 5 (Week 1): Pt will propel wheelchair 73f with min assist PT Short Term Goal 5 - Progress (Week 1): Not met Week 2:  PT Short Term Goal 1 (Week 2): Pt will perform bed<>chair transfer with asisst of 1 PT Short Term Goal 2 (Week 2): Pt will initate standing mobility PT Short Term Goal 3 (Week 2): Pt will initiate w/c propulsion training  Skilled Therapeutic Interventions/Progress Updates:  Session 1  Pt received supine in bed, reported 7/10 pain in RLE and was premedicated. Offered positional changes for pain relief throughout session. Supine <>sit EOB w/mod A for LE management, trunk support, cues for motor planning and hand placement. Once at EOB, mod A to scoot hips towards EOB, manual facilitation of lateral weightshift required. Pt frequently reaching to grab therapist rather than push through Ues to assist transfer despite cues. Lateral board transfer to L side w/total A x2 helpers to place board, facilitate anterolateral weight shift and assist w/motor planning. Pt unable to follow cues, frequently grabbing onto therapist rather than assist transfer. Once in  WC, max A to adjust hips in seat. Pt self-propelled 100' to day room w/supervision. Lateral board transfer to L side w/total A x3 for steadying of WC, facilitation of lateral weight shift, trunk support and board placement.   Sit <>stands w/Stedy for improved transfers, weightbearing tolerance of LLE and anterior weight shift. Required max A x3 for LE management, trunk support, heavy multimodal cues for glute extension, hand placement and anterior weight shift. Pt performed 3 sit <>stands and reached 75% extension once, knee guards on Stedy prevented full extension due to R knee immobilizer.   At edge of mat for anterior weight shift practice:  -Forward reach for cone placed on chair 1' away, mod Ax2 for trunk support. Encouraged pt to walk hands forward to grab cone, then walk back for improved core control, multimodal cues for posterior chain activation. Pt able to grab cone x5, improving technique and core stability each reach.   Lateral board transfer to R side w/total A x2. Pt demonstrated improved anterior weight shift but wrapped Ues around therapist, requiring max A to complete transfer. Pt transported back to room w/total A and was left seated in WC in room, all needs in reach, reported pain as 7/10 and declined further pain intervention.   Session 2 Pt received sitting in WC in room, reported 7/10 pain in RLE and was premedicated. Pt reported she had not urinated since 8:30am that morning despite drinking lots of fluid, communicated w/nursing and PA. Nursing performed bladder scan which upset pt. Lengthy conversation  regarding importance of urgency sensation and voiding bladder regularly, pt very defensive. Lateral board transfer from [facility] to EOB on L side w/total A x2 due to impaired motor planning, trunk support, anterior weight shift and LE management. Sit <>supine w/mod A for trunk support and LE management. Pt rolled L & R several times throughout session w/use of bedrail and min A. Doffed  pants via rolling and placed bedpan, pt urinated but very dark and concentrated, nursing notified. Pt soiled sheets and R knee immobilizer, several rolls L & R to change sheets, remove and donn pants and remove immobilizer. Nursing notified to obtain new immobilizer. Pt was left supine in bed, all needs in reach, PA and nursing made aware of situation.   Therapy Documentation Precautions:  Precautions Precautions: Fall, Other (comment) Precaution Comments: Fear of falls, anxiety, back precautions (pt unable to verbalize any of 3) Required Braces or Orthoses: Knee Immobilizer - Right Knee Immobilizer - Right: On at all times Restrictions Weight Bearing Restrictions: Yes RLE Weight Bearing: Non weight bearing    Therapy/Group: Individual Therapy Cruzita Lederer Mykaila Blunck, PT, DPT  10/01/2020, 7:44 AM

## 2020-10-01 NOTE — Progress Notes (Signed)
Recreational Therapy Session Note  Patient Details  Name: Haley Roberts MRN: [id number] Date of Birth: 1945/08/29 Today's Date: 10/01/2020  Pain: c/o RLE pain, unrated, staff aware Skilled Therapeutic Interventions/Progress Updates: Session focused on further discussion of stress management/coping strategies and preparation for discharge next week.  Pt states she is looking forward to being home again with her family and expects that things will continue to improve.  Pt states that she is recognizing progress and anticipates more in the days ahead leading up to discharge.  Therapy/Group: Individual Therapy  Aashna Matson 10/01/2020, 8:38 AM

## 2020-10-01 NOTE — Op Note (Signed)
09/15/2020 - 09/16/2020  8:10 PM  PATIENT:  Haley Roberts  75 y.o. female With severe stenosis at L3/4,4/5 and neurogenic claudication. PRE-OPERATIVE DIAGNOSIS:  Lumbar four-five spondylolisthesis, lumbar 3/4 lumbar stenosis  POST-OPERATIVE DIAGNOSIS:  Lumbar four-five spondylolisthesis Lumbar 3/4 stenosis  PROCEDURE:  Procedure(s): Lumbar Three-Four, Lumbar Four-Five Posterior lumbar interbody fusion Interbody titanium cages packed with autograft (Stryker, casc[place]) Segmental pedicle screw fixation L3-5, nuvasive relign Laminectomy L3, L4, in excess of the needed exposure for a Plif L3/4,4/5  SURGEON:  Surgeon(s): Ashok Pall, MD Vallarie Mare, MD  ASSISTANTS:Thomas, Roderic Palau  ANESTHESIA:   general  EBL:  No intake/output data recorded.  BLOOD ADMINISTERED:none  CELL SAVER GIVEN:none  COUNT:per nursing  DRAINS: none   SPECIMEN:  No Specimen  DICTATION: [name] is a 75 y.o. female whom was taken to the operating room intubated, and placed under a general anesthetic without difficulty. A foley catheter was placed under sterile conditions. She was positioned prone on a Jackson table with all pressure points properly padded.  Her lumbar region was prepped and draped in a sterile manner. I infiltrated 10cc's 1/2%lidocaine/1:2000,000 strength epinephrine into the planned incision. I opened the skin with a 10 blade and took the incision down to the thoracolumbar fascia. I exposed the lamina of L2,3,4, and 5 in a subperiosteal fashion bilaterally. I confirmed my location with an intraoperative xray.  I placed self retaining retractors and started the decompression.  I decompressed the spinal canal via complete laminectomies and inferior facetectomies L3,4, partial superior facetectomies L4, and L5, hemilaminectomy L5. This allowed for decompression of the l3,4, and L5 nerve roots, the thecal sac, there lateral recesses and the foramina. The exposure was well beyond  the needed exposure for the PLIF's . PLIF's were performed at L3/4,4/5 in the same fashion. I opened the disc spaces with a 15 blade then used a variety of instruments to remove the disc and prepare the space for the arthrodesis. I used curettes, rongeurs, punches, shavers for the disc space, and rasps in the discetomy. I measured the disc space and placed 2 titanium cages(Stryker [place]) into the disc space(s) at 3/4, and 4/5.   I placed pedicle screws at 3,4,and 5, using fluoroscopic guidance. I drilled a pilot hole, then cannulated the pedicle with a drill at each site. I then tapped each pedicle, assessing each site for pedicle violations. No cutouts were appreciated. Screws Harlin Heys) were then placed at each site without difficulty. We attached rods and locking caps with the appropriate tools. The locking caps were secured with torque limited screwdrivers. Final films were performed and the final construct appeared to be in good position.  I closed the wound in a layered fashion. I approximated the thoracolumbar fascia, subcutaneous, and subcuticular planes with vicryl sutures. I used dermabond, and an occlusive bandage for a sterile dressing.     PLAN OF CARE: Admit to inpatient   PATIENT DISPOSITION:  PACU - hemodynamically stable.   Delay start of Pharmacological VTE agent (>24hrs) due to surgical blood loss or risk of bleeding:  yes

## 2020-10-01 NOTE — Progress Notes (Signed)
Occupational Therapy Session Note  Patient Details  Name: Haley Roberts MRN: [id number] Date of Birth: 11-Jan-1946  Today's Date: 10/02/2020 OT Individual Time: 1001-1056 OT Individual Time Calculation (min): 55 min     Skilled Therapeutic Interventions/Progress Updates:    Pt greeted in the w/c, with c/o pain in hips and back. Repositioning and rest breaks provided throughout session to address pain therapeutically. To work on Lexicographer, activity tolerance, and OOB tolerance, pt engaged in UB bathing, oral care, hair washing/brushing/styling with products/blow-drying, and makeup application while seated at the sink. Max A and vcs to successfully doff her shirt. Pt with improved ability to weight shift anteriorly to reach faucet levers and items on sink compared to session with this therapist last week. Vcs throughout session for basic problem solving, Min A to detangle hair fully and to locate needed items inside of her cosmetic bags. She agreed to remain sitting up in the w/c at close of session, all needs within reach and safety belt fastened.    3/3 recall of her back precautions today without cues  Therapy Documentation Precautions:  Precautions Precautions: Fall, Other (comment) Precaution Comments: Fear of falls, anxiety, back precautions (pt unable to verbalize any of 3) Required Braces or Orthoses: Knee Immobilizer - Right Knee Immobilizer - Right: On at all times Restrictions Weight Bearing Restrictions: Yes RLE Weight Bearing: Non weight bearing  ADL: ADL Grooming: Minimal assistance Where Assessed-Grooming: Sitting at sink Upper Body Bathing: Minimal assistance Where Assessed-Upper Body Bathing: Wheelchair, Sitting at sink Lower Body Bathing: Maximal assistance Where Assessed-Lower Body Bathing: Bed level Upper Body Dressing: Minimal assistance Where Assessed-Upper Body Dressing: Wheelchair, Sitting at sink Lower Body Dressing: Maximal assistance Where  Assessed-Lower Body Dressing: Bed level Toilet Transfer: Other (comment) (simulated to w/c +2 A) Toilet Transfer Method: Transfer board    Therapy/Group: Individual Therapy  Desira Alessandrini A Toshiro Hanken 10/02/2020, 1:22 PM

## 2020-10-01 NOTE — Progress Notes (Signed)
Occupational Therapy Session Note  Patient Details  Name: Ravinder A Levings MRN: [id number] Date of Birth: 11/30/1945  Today's Date: 10/01/2020 OT Individual Time: 1100-1200 OT Individual Time Calculation (min): 60 min    Short Term Goals: Week 1:  OT Short Term Goal 1 (Week 1): Pt will sit EOB wiht CGA for sitting balance for 5 min OT Short Term Goal 1 - Progress (Week 1): Met OT Short Term Goal 2 (Week 1): Pt will SB consistently wiht MOD A to w/c in prep for BSC transfers OT Short Term Goal 2 - Progress (Week 1): Progressing toward goal OT Short Term Goal 3 (Week 1): Pt will roll in bed wiht MOD A to decrease BOC for LB dressing OT Short Term Goal 3 - Progress (Week 1): Met OT Short Term Goal 4 (Week 1): Pt will recall 3/3 back precautions wiht use of external aides as needed OT Short Term Goal 4 - Progress (Week 1): Met Week 2:  OT Short Term Goal 1 (Week 2): Pt will complete slideboard transfers consistently with 1 assist in prep for BSC transfer OT Short Term Goal 2 (Week 2): Pt will complete 2 grooming tasks sitting EOB with no more than supervision for balance assistance OT Short Term Goal 3 (Week 2): Pt will complete UB dressing with Min A while sitting unsupported    Skilled Therapeutic Interventions/Progress Updates:    Pt received in wc and agreeable to therapy.  Spent time discussing her issues with anxiety and how that is part of the nornal healing process. But to focus on what she is accomplishing to build her confidence.   Pt agreeable to practicing BSC transfers. Obtained a flat drop arm BSC that would be easier to use with SB. Placed it over toilet (should try next time not over toilet due to height).  Showed pt how she would transfer and her NT arrived to help with +2 support.  Placed board with total A and pt needed significant cues to press hands down on board vs grabbing the ends, to sit upright with slight lean from the hips (versus leaning back) and to look the  opposite direction.  She needed step stool under left foot.  She had great difficulty keeping foot in place as pt stated she has limited sensation.  Overall pt required max to move onto BSC and max to total to return to wc.  Told pt to practice pushing her L foot into her leg rest pad as if she was pushing on a brake to work on her proprioception. She needs stronger arms for SB transfers. Worked with level 2 theraband exercises for triceps and back. In teaching her the exercises, she seems to have challenges with motor planning and overall body awareness as she needed max cues initially on how to do the exercises. She eventually progressed to min cues then set up.  Encouraged pt to practice these exercises over the weekend.  Resting in wc with belt alarm on and all needs met.    Therapy Documentation Precautions:  Precautions Precautions: Fall, Other (comment) Precaution Comments: Fear of falls, anxiety, back precautions (pt unable to verbalize any of 3) Required Braces or Orthoses: Knee Immobilizer - Right Knee Immobilizer - Right: On at all times Restrictions Weight Bearing Restrictions: Yes RLE Weight Bearing: Non weight bearing Pain: Pain Assessment Pain Scale: 0-10 Pain Score: 0-No pain ADL: ADL Grooming: Minimal assistance Where Assessed-Grooming: Sitting at sink Upper Body Bathing: Minimal assistance Where Assessed-Upper Body Bathing: Wheelchair, Sitting   at sink Lower Body Bathing: Maximal assistance Where Assessed-Lower Body Bathing: Bed level Upper Body Dressing: Minimal assistance Where Assessed-Upper Body Dressing: Wheelchair, Sitting at sink Lower Body Dressing: Maximal assistance Where Assessed-Lower Body Dressing: Bed level Toilet Transfer: Other (comment) (simulated to w/c +2 A) Toilet Transfer Method: Transfer board   Therapy/Group: Individual Therapy  [place] 10/01/2020, 9:54 AM

## 2020-10-02 NOTE — Progress Notes (Addendum)
Occupational Therapy Session Note  Patient Details  Name: Haley Roberts MRN: [id number] Date of Birth: May 29, 1945  Today's Date: 10/02/2020 OT Individual Time: 1007-1219 and 1300-1310 OT Individual Time Calculation (min): 25 min and 10 min   Short Term Goals: Week 1:  OT Short Term Goal 1 (Week 1): Pt will sit EOB wiht CGA for sitting balance for 5 min OT Short Term Goal 1 - Progress (Week 1): Met OT Short Term Goal 2 (Week 1): Pt will SB consistently wiht MOD A to w/c in prep for BSC transfers OT Short Term Goal 2 - Progress (Week 1): Progressing toward goal OT Short Term Goal 3 (Week 1): Pt will roll in bed wiht MOD A to decrease BOC for LB dressing OT Short Term Goal 3 - Progress (Week 1): Met OT Short Term Goal 4 (Week 1): Pt will recall 3/3 back precautions wiht use of external aides as needed OT Short Term Goal 4 - Progress (Week 1): Met Week 2:  OT Short Term Goal 1 (Week 2): Pt will complete slideboard transfers consistently with 1 assist in prep for [facility] transfer OT Short Term Goal 2 (Week 2): Pt will complete 2 grooming tasks sitting EOB with no more than supervision for balance assistance OT Short Term Goal 3 (Week 2): Pt will complete UB dressing with Min A while sitting unsupported   Skilled Therapeutic Interventions/Progress Updates:    Pt greeted at time of session semireclined in bed finishing up breakfast, no pain reported and wanting to get OOB during short OT session this am. Initial part of session spent discussing DC planning, inconsistent skill performance with slide board transfers, potential need for additional hoyer lift for transfers, etc. Pt stating she still wants to go home, open to hoyer for safety, and planning to continue DC planning. Supine > sit Mod A, total A slide board placement and slide board bed > chair Mod A of 1 with 2nd helper spotting for safety. Continued cues for hand placement, preventing pinching fingers, and anterior weight shifting. Set up  with alarm on call bell in reach.   Session 2: Pt greeted at time of unplanned additional session as pt needed to go back to bed and is slide board with therapy only. No pain. Nursing staff 2nd helper for supervision for safety. Total A slide board placement under L hip, Min/Mod for slide board > bed with extended time and therapist holding down L foot on block. Cues for hand placement and anterior weight shifting. Sit > supine Mod A, scoot up in bed 2 helpers. Hand off to nursing.    Therapy Documentation Precautions:  Precautions Precautions: Fall, Other (comment) Precaution Comments: Fear of falls, anxiety, back precautions (pt unable to verbalize any of 3) Required Braces or Orthoses: Knee Immobilizer - Right Knee Immobilizer - Right: On at all times Restrictions Weight Bearing Restrictions: Yes RLE Weight Bearing: Non weight bearing     Therapy/Group: Individual Therapy  [name] 10/02/2020, 7:07 AM

## 2020-10-03 DIAGNOSIS — R0989 Other specified symptoms and signs involving the circulatory and respiratory systems: Secondary | ICD-10-CM

## 2020-10-03 NOTE — Progress Notes (Signed)
PROGRESS NOTE   Subjective/Complaints: Patient seen sitting up in bed this morning.  She states she slept well overnight.  She denies complaints.  She is looking forward to a day of rest.  ROS: Denies CP, SOB, N/V/D  Objective:   No results found. No results for input(s): WBC, HGB, HCT, PLT in the last 72 hours.  No results for input(s): NA, K, CL, CO2, GLUCOSE, BUN, CREATININE, CALCIUM in the last 72 hours.   Intake/Output Summary (Last 24 hours) at 10/03/2020 1011 Last data filed at 10/03/2020 0803 Gross per 24 hour  Intake 644 ml  Output --  Net 644 ml         Physical Exam: Vital Signs Blood pressure (!) 141/83, pulse 82, temperature 98.5 F (36.9 C), temperature source Oral, resp. rate 16, height '5\' 2"'$  (1.575 m), weight 66 kg, SpO2 98 %. Constitutional: No distress . Vital signs reviewed. HENT: Normocephalic.  Atraumatic. Eyes: EOMI. No discharge. Cardiovascular: No JVD.  RRR. Respiratory: Normal effort.  No stridor.  Bilateral clear to auscultation. GI: Non-distended.  BS +. Skin: Warm and dry.  Intact. Psych: Normal mood.  Normal behavior. Rheumatoid arthritis changes to bilateral hands and feet, stable Neurological: Alert and oriented Motor: 4/5 bilateral upper extremities Right lower extremity: Hip flexion 1/5, knee brace, ankle dorsiflexion 0/5, persistent Left lower extremity: 4/5 proximal distal   Assessment/Plan: 1. Functional deficits which require 3+ hours per day of interdisciplinary therapy in a comprehensive inpatient rehab setting. Physiatrist is providing close team supervision and 24 hour management of active medical problems listed below. Physiatrist and rehab team continue to assess barriers to discharge/monitor patient progress toward functional and medical goals  Care Tool:  Bathing    Body parts bathed by patient: Right arm, Left arm, Chest, Abdomen, Right upper leg, Left upper leg,  Face, Right lower leg, Left lower leg   Body parts bathed by helper: Front perineal area, Buttocks     Bathing assist Assist Level: Moderate Assistance - Patient 50 - 74%     Upper Body Dressing/Undressing Upper body dressing   What is the patient wearing?: Pull over shirt    Upper body assist Assist Level: Minimal Assistance - Patient > 75%    Lower Body Dressing/Undressing Lower body dressing      What is the patient wearing?: Pants     Lower body assist Assist for lower body dressing: Maximal Assistance - Patient 25 - 49%     Toileting Toileting    Toileting assist Assist for toileting: Total Assistance - Patient < 25%     Transfers Chair/bed transfer  Transfers assist  Chair/bed transfer activity did not occur: Safety/medical concerns  Chair/bed transfer assist level: Moderate Assistance - Patient 50 - 74% (slide board)     Locomotion Ambulation   Ambulation assist   Ambulation activity did not occur: Safety/medical concerns          Walk 10 feet activity   Assist  Walk 10 feet activity did not occur: Safety/medical concerns        Walk 50 feet activity   Assist Walk 50 feet with 2 turns activity did not occur: Safety/medical concerns  Walk 150 feet activity   Assist Walk 150 feet activity did not occur: Safety/medical concerns         Walk 10 feet on uneven surface  activity   Assist Walk 10 feet on uneven surfaces activity did not occur: Safety/medical concerns         Wheelchair     Assist Is the patient using a wheelchair?: Yes Type of Wheelchair: Manual Wheelchair activity did not occur: Safety/medical concerns  Wheelchair assist level: Set up assist Max wheelchair distance: 125'    Wheelchair 50 feet with 2 turns activity    Assist    Wheelchair 50 feet with 2 turns activity did not occur: Safety/medical concerns   Assist Level: Contact Guard/Touching assist   Wheelchair 150 feet activity      Assist  Wheelchair 150 feet activity did not occur: Safety/medical concerns       Medical Problem List and Plan: 1.  Functional and mobility deficits secondary to lumbar stenosis with previous supracondylar right femur fx and right calcaneus fx after fall  Continue CIR 2.  Antithrombotics: -DVT/anticoagulation:  Mechanical: Sequential compression devices, below knee Bilateral lower extremities  9/8- on Lovenox daily             -antiplatelet therapy: N/A 3. Pain Management: Used hydrocodone 10 mg TID PTA. On prednisone taper with Oxycontin 10 mg bid and oxycodone prn.              --Flexeril TID, Gabapentin   Flexeril 10 mg TID and Lidoderm patches to neck 8pm to 8am  Controlled on 9/18 4. Mood: Team to provide ego support. [name] 5 mg TID for anxiety Valium 5 mg q56 hours prn for spasms, but could help anxiety?  Improved on 1/18             -antipsychotic agents: N/A 5. Neuropsych: This patient is capable of making decisions on her own behalf. 6. Skin/Wound Care: Routine pressure relief measures. Monitor incision for healing.  7. Fluids/Electrolytes/Nutrition:  Monitor I/O.  KCL 10 mEq daily since on HCTZ  Added megace 9/13- prealbumin is 11.2 8. HTN: Monitor BP TID--continue HCTZ and Cozaar  Slightly labile, but overall controlled on 9/18 9. RA: Continue Arava daily with chronic prednisone (higher doses with taper currently).  10. H/o depression: Continue Wellbutrin and Lexapro.  11. Leukocytosis:  Completed course of cefdinir  12. Pre-diabetes: Hyperglycemia due to increase in steroids. Will monitor FBS for now.  13. Hypocalcemia: Added Calcium supplement.  14. Constipation: Added senna S two tabs with supper. Added Sorbitol   Improving on 9/14 15. Urinary retention:  Cannot start Flomax- had seizures with Sulfa Started Bethanachol 10 mg TID for now as well as treatment for UTI 16. Low albumin: add Juven to promote wound healing 17. Hyponatremia  Sodium 134 on  9/12, labs ordered for tomorrow  Continue to monitor 18.  Acute blood loss anemia  Hemoglobin 9.2 on 9/12, labs ordered for tomorrow  LOS: 16 days A FACE TO FACE EVALUATION WAS PERFORMED  Samariah Hokenson [name] 10/03/2020, 10:11 AM

## 2020-10-04 DIAGNOSIS — M5416 Radiculopathy, lumbar region: Secondary | ICD-10-CM

## 2020-10-04 DIAGNOSIS — M48061 Spinal stenosis, lumbar region without neurogenic claudication: Secondary | ICD-10-CM

## 2020-10-04 LAB — BASIC METABOLIC PANEL
Anion gap: 11 (ref 5–15)
BUN: 25 mg/dL — ABNORMAL HIGH (ref 8–23)
CO2: 22 mmol/L (ref 22–32)
Calcium: 9.1 mg/dL (ref 8.9–10.3)
Chloride: 100 mmol/L (ref 98–111)
Creatinine, Ser: 0.69 mg/dL (ref 0.44–1.00)
GFR, Estimated: >60 mL/min (ref >60)
Glucose, Bld: 97 mg/dL (ref 70–99)
Potassium: 4.3 mmol/L (ref 3.5–5.1)
Sodium: 133 mmol/L — ABNORMAL LOW (ref 135–145)

## 2020-10-04 LAB — CBC
HCT: 27 % — ABNORMAL LOW (ref 36.0–46.0)
Hemoglobin: 8.7 g/dL — ABNORMAL LOW (ref 12.0–15.0)
MCH: 28.8 pg (ref 26.0–34.0)
MCHC: 32.2 g/dL (ref 30.0–36.0)
MCV: 89.4 fL (ref 80.0–100.0)
Platelets: 586 10*3/uL — ABNORMAL HIGH (ref 150–400)
RBC: 3.02 MIL/uL — ABNORMAL LOW (ref 3.87–5.11)
RDW: 14.1 % (ref 11.5–15.5)
WBC: 6.9 10*3/uL (ref 4.0–10.5)
nRBC: 0 % (ref 0.0–0.2)

## 2020-10-04 NOTE — Progress Notes (Signed)
Occupational Therapy Session Note  Patient Details  Name: Haley Roberts MRN: [id number] Date of Birth: December 26, 1945  Today's Date: 10/04/2020 OT Individual Time: 9747-1855 and 1500-1541 OT Individual Time Calculation (min): 57 min and 41 min   Short Term Goals: Week 1:  OT Short Term Goal 1 (Week 1): Pt will sit EOB wiht CGA for sitting balance for 5 min OT Short Term Goal 1 - Progress (Week 1): Met OT Short Term Goal 2 (Week 1): Pt will SB consistently wiht MOD A to w/c in prep for BSC transfers OT Short Term Goal 2 - Progress (Week 1): Progressing toward goal OT Short Term Goal 3 (Week 1): Pt will roll in bed wiht MOD A to decrease BOC for LB dressing OT Short Term Goal 3 - Progress (Week 1): Met OT Short Term Goal 4 (Week 1): Pt will recall 3/3 back precautions wiht use of external aides as needed OT Short Term Goal 4 - Progress (Week 1): Met Week 2:  OT Short Term Goal 1 (Week 2): Pt will complete slideboard transfers consistently with 1 assist in prep for [facility] transfer OT Short Term Goal 2 (Week 2): Pt will complete 2 grooming tasks sitting EOB with no more than supervision for balance assistance OT Short Term Goal 3 (Week 2): Pt will complete UB dressing with Min A while sitting unsupported   Skilled Therapeutic Interventions/Progress Updates:    Session 1: Pt greeted at time of session up in wheelchair, no pain, agreeable to OT session. ADL needs met. Discussed with pt calling son to discuss CLOF, pt agreeable and tried calling but no answer. Will try to connect at another time. Pt self propel to sink for oral hygiene and face washing before self propelling to main gym Min A for tight turns only, Supervision for straight paths. Sit <> stand 3 trials in parallel bars in prep to improve standing tolerance for ADL, Mod A overall and difficulty keeping RLE off ground. Propel > ADL apartment and focused on home set up, getting items out of fridge from w/c level to simulate home environment  and within back precautions, did so with Supervision and discussed setting up easy to access items in kitchen. BITS for 2 rounds of dynamic seated activity encouraging anterior weight shifting within back precautions to improve comfort and decrease nervousness. Back in room set up alarm on call bell in reach.    Session 2: Pt greeted at time of session sitting up in wheelchair, no pain reported, agreeable to OT session to focus on slide board transfers. Slide board w/c > bed <> bariatric DABSC with Min-Mod A. 2nd helper to assist as well for holding slide board stedy during [facility] transfer d/t sliding on surface and for safety. Therapist holding down LLE on block for all transfers, cues for anterior weight shift and hand placement. Continues to have decreased processing and nervousness during sessions. Had pt perform teach back in order to maximize carryover and instruct care, still needed mod cues to remember steps. Also attempted lateral leans on bariatric drop arm, improved from previous sessions but still not far enough for toileting. Pt in bed with alarm on call bell in reach, provided hand out to purchase step up block for home.   Therapy Documentation Precautions:  Precautions Precautions: Fall, Other (comment) Precaution Comments: Fear of falls, anxiety, back precautions (pt unable to verbalize any of 3) Required Braces or Orthoses: Knee Immobilizer - Right Knee Immobilizer - Right: On at all times Restrictions Weight  Bearing Restrictions: Yes RLE Weight Bearing: Non weight bearing     Therapy/Group: Individual Therapy  [name] 10/04/2020, 7:27 AM

## 2020-10-04 NOTE — Progress Notes (Signed)
Patient ID: Haley Roberts, female   DOB: 07/21/1945, 75 y.o.   MRN: [id number]  Family education scheduled with patient son Ysidro Evert) on Wednesday, 10/06/20 1-4 PM  [name], [place]

## 2020-10-04 NOTE — Progress Notes (Addendum)
PROGRESS NOTE   Subjective/Complaints:  No pain issues , feels numb in Right foot, but cannot say if it started after spine surgery or after LE fractures   ROS: Denies CP, SOB, N/V/D  Objective:   No results found. Recent Labs    10/04/20 0609  WBC 6.9  HGB 8.7*  HCT 27.0*  PLT 586*    Recent Labs    10/04/20 0609  NA 133*  K 4.3  CL 100  CO2 22  GLUCOSE 97  BUN 25*  CREATININE 0.69  CALCIUM 9.1     Intake/Output Summary (Last 24 hours) at 10/04/2020 1154 Last data filed at 10/03/2020 1920 Gross per 24 hour  Intake 318 ml  Output --  Net 318 ml         Physical Exam: Vital Signs Blood pressure (!) 146/99, pulse 87, temperature 98.3 F (36.8 C), resp. rate 18, height 5\' 2"  (1.575 m), weight 66 kg, SpO2 100 %.  General: No acute distress Mood and affect are appropriate Heart: Regular rate and rhythm no rubs murmurs or extra sounds Lungs: Clear to auscultation, breathing unlabored, no rales or wheezes Abdomen: Positive bowel sounds, soft nontender to palpation, nondistended Extremities: No clubbing, cyanosis, or edema Skin: No evidence of breakdown, no evidence of rash Rheumatoid arthritis changes to bilateral hands and feet, stable  R>L 2nd and 3rd MCP swelling, also OA changes with Heberdens nodules to DIPs bilaterally R>L Neurological: Alert and oriented Motor: 4/5 bilateral upper extremities Sensation absent Right foot L5, S1 distribution  Right lower extremity: Hip flexion 1/5, knee brace, ankle dorsiflexion 0/5, persistent Left lower extremity: 4/5 proximal distal   Assessment/Plan: 1. Functional deficits which require 3+ hours per day of interdisciplinary therapy in a comprehensive inpatient rehab setting. Physiatrist is providing close team supervision and 24 hour management of active medical problems listed below. Physiatrist and rehab team continue to assess barriers to  discharge/monitor patient progress toward functional and medical goals  Care Tool:  Bathing    Body parts bathed by patient: Right arm, Left arm, Chest, Abdomen, Right upper leg, Left upper leg, Face, Right lower leg, Left lower leg   Body parts bathed by helper: Front perineal area, Buttocks     Bathing assist Assist Level: Moderate Assistance - Patient 50 - 74%     Upper Body Dressing/Undressing Upper body dressing   What is the patient wearing?: Pull over shirt    Upper body assist Assist Level: Minimal Assistance - Patient > 75%    Lower Body Dressing/Undressing Lower body dressing      What is the patient wearing?: Pants     Lower body assist Assist for lower body dressing: Maximal Assistance - Patient 25 - 49%     Toileting Toileting    Toileting assist Assist for toileting: Total Assistance - Patient < 25%     Transfers Chair/bed transfer  Transfers assist  Chair/bed transfer activity did not occur: Safety/medical concerns  Chair/bed transfer assist level: Moderate Assistance - Patient 50 - 74% (slide board)     Locomotion Ambulation   Ambulation assist   Ambulation activity did not occur: Safety/medical concerns  Walk 10 feet activity   Assist  Walk 10 feet activity did not occur: Safety/medical concerns        Walk 50 feet activity   Assist Walk 50 feet with 2 turns activity did not occur: Safety/medical concerns         Walk 150 feet activity   Assist Walk 150 feet activity did not occur: Safety/medical concerns         Walk 10 feet on uneven surface  activity   Assist Walk 10 feet on uneven surfaces activity did not occur: Safety/medical concerns         Wheelchair     Assist Is the patient using a wheelchair?: Yes Type of Wheelchair: Manual Wheelchair activity did not occur: Safety/medical concerns  Wheelchair assist level: Set up assist Max wheelchair distance: 125'    Wheelchair 50 feet  with 2 turns activity    Assist    Wheelchair 50 feet with 2 turns activity did not occur: Safety/medical concerns   Assist Level: Contact Guard/Touching assist   Wheelchair 150 feet activity     Assist  Wheelchair 150 feet activity did not occur: Safety/medical concerns       Medical Problem List and Plan: 1.  Functional and mobility deficits secondary to lumbar stenosis s/p L3-4 , L4-5 lami  09/15/20 with previous supracondylar right femur fx and right calcaneus fx after fall  Continue CIR OT, PT  2.  Antithrombotics: -DVT/anticoagulation:  Mechanical: Sequential compression devices, below knee Bilateral lower extremities  9/8- on Lovenox daily             -antiplatelet therapy: N/A 3. Pain Management: Used hydrocodone 10 mg TID PTA. On prednisone taper with Oxycontin 10 mg bid and oxycodone prn.              --Flexeril TID, Gabapentin   Flexeril 10 mg TID and Lidoderm patches to neck 8pm to 8am  Controlled on 9/18 4. Mood: Team to provide ego support. [name] 5 mg TID for anxiety Valium 5 mg q56 hours prn for spasms, but could help anxiety?  Improved 9/19             -antipsychotic agents: N/A 5. Neuropsych: This patient is capable of making decisions on her own behalf. 6. Skin/Wound Care: Routine pressure relief measures. Monitor incision for healing.  7. Fluids/Electrolytes/Nutrition:  Monitor I/O.  KCL 10 mEq daily since on HCTZ  Added megace 9/13- prealbumin is 11.2 8. HTN: Monitor BP TID--continue HCTZ and Cozaar  Slightly labile, but overall controlled on 9/19 9. RA: Continue Arava daily with chronic prednisone (higher doses with taper currently).  10. H/o depression: Continue Wellbutrin and Lexapro.  11. Leukocytosis:  Completed course of cefdinir  12. Pre-diabetes: Hyperglycemia due to increase in steroids. Will monitor FBS for now.  13. Hypocalcemia: Added Calcium supplement.  14. Constipation: Added senna S two tabs with supper. Added Sorbitol    Improving on 9/14 15. Urinary retention:  Cannot start Flomax- had seizures with Sulfa Started Bethanachol 10 mg TID for now as well as treatment for UTI 16. Low albumin: add Juven to promote wound healing 17. Hyponatremia  Sodium 134 on 9/12, labs ordered for tomorrow  Continue to monitor 18.  Acute blood loss anemia  Hemoglobin 9.2 on 9/12, labs ordered for tomorrow 19.  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

## 2020-10-04 NOTE — Progress Notes (Signed)
Physical Therapy Session Note  Patient Details  Name: Haley Roberts MRN: [id number] Date of Birth: 03/14/1945  Today's Date: 10/04/2020 PT Individual Time: 0768-0881,1031-5945 PT Individual Time Calculation (min): 60 min, 45 min   Short Term Goals: Week 1:  PT Short Term Goal 1 (Week 1): Pt will perform supine<>sit with +2 max assist PT Short Term Goal 1 - Progress (Week 1): Met PT Short Term Goal 2 (Week 1): Pt will perform bed<>chair transfers using LRAD with +2 max assist PT Short Term Goal 2 - Progress (Week 1): Met PT Short Term Goal 3 (Week 1): Pt will tolerate upright, OOB sitting for at least 1 hour between therapy sessions PT Short Term Goal 3 - Progress (Week 1): Met PT Short Term Goal 4 (Week 1): Pt will recall 2/3 spine precautions without cuing PT Short Term Goal 4 - Progress (Week 1): Met PT Short Term Goal 5 (Week 1): Pt will propel wheelchair 70f with min assist PT Short Term Goal 5 - Progress (Week 1): Not met Week 2:  PT Short Term Goal 1 (Week 2): Pt will perform bed<>chair transfer with asisst of 1 PT Short Term Goal 2 (Week 2): Pt will initate standing mobility PT Short Term Goal 3 (Week 2): Pt will initiate w/c propulsion training  Skilled Therapeutic Interventions/Progress Updates:    Session 1: pt received in bed and agreeable to therapy with nursing present for meds pass. No complaint of pain. Supine>sit with min A to maintain back precautions and max VC to sit upright. Pt then performed slideboard transfer with min A toward L side and slightly uphill. Pt transported to therapy gym for time management and energy conservation. Pt stood with knee block and min-mod A with fatigue in // bars x 10 with extended rest breaks at times. Pt required cueing for hip extension and to maintain NWB on RLE. Pt then performed 3 x 10 of each OH press with 3lb bar and AAROM into eversion with LLE. Pt returned to room and remained in w/c, was left with all needs in reach and alarm  active.   Session 2:  Pt seated in w/c on arrival and agreeable to therapy. No complaint of pain. Pt transported to therapy gym for time management and energy conservation. Session focused on improving pt's ability to bear weight through LLE and attempt to stand out side // bars. Attempted stand from w/c with max A of 1 for Stand pivot transfer but pt was unable to stand from this position. Slideboard transfer w/c<> mat table with modA to stabilize board, provide approximation force in LLE and assist with scoots at times. Pt then performed anterior weight shift with small push from LLE. Pt was unable to clear bottom but did demo muscle activation in LLE demoing Weight bearing in that leg. Anterior LOB x 2 during practice. Pt demoed improving awareness of limits of stability, but did require assist to return upright. Pt became anxious at times during transfers and practice d/t first stand attempt not going well. Discussed need to down regulate nervous system if this happens at home because she moves poorly when overwhelmed/anxious. Discussed and practiced deep breathing for anxiety regulation. Pt remained in w/c after session and was left with all needs in reach and alarm active.   Therapy Documentation Precautions:  Precautions Precautions: Fall, Other (comment) Precaution Comments: Fear of falls, anxiety, back precautions (pt unable to verbalize any of 3) Required Braces or Orthoses: Knee Immobilizer - Right Knee Immobilizer - Right:  On at all times Restrictions Weight Bearing Restrictions: Yes RLE Weight Bearing: Non weight bearing     Therapy/Group: Individual Therapy  Mickel Fuchs 10/04/2020, 12:50 PM

## 2020-10-04 NOTE — Progress Notes (Signed)
Physical Therapy Weekly Progress Note  Patient Details  Name: Haley Roberts MRN: [id number] Date of Birth: 12/02/1945  Beginning of progress report period: September 24, 2020 End of progress report period: October 04, 2020    Patient has met 3 of 3 short term goals.  Pt consistently performing slide board transfers with assist of 1, standing in // bars with min-modA, w/c mobility up to 100 ft.  Patient continues to demonstrate the following deficits muscle weakness, decreased cardiorespiratoy endurance, impaired timing and sequencing, abnormal tone, decreased coordination, and decreased motor planning, and decreased sitting balance, decreased balance strategies, and difficulty maintaining precautions and therefore will continue to benefit from skilled PT intervention to increase functional independence with mobility.  Patient progressing toward long term goals..  Continue plan of care.  PT Short Term Goals Week 2:  PT Short Term Goal 1 (Week 2): Pt will perform bed<>chair transfer with asisst of 1 PT Short Term Goal 1 - Progress (Week 2): Met PT Short Term Goal 2 (Week 2): Pt will initate standing mobility PT Short Term Goal 2 - Progress (Week 2): Met PT Short Term Goal 3 (Week 2): Pt will initiate w/c propulsion training PT Short Term Goal 3 - Progress (Week 2): Met Week 3:  PT Short Term Goal 1 (Week 3): =LTGs d/t ELOS  Skilled Therapeutic Interventions/Progress Updates:  Ambulation/gait training;Community reintegration;DME/adaptive equipment instruction;Neuromuscular re-education;Psychosocial support;UE/LE Strength taining/ROM;Wheelchair propulsion/positioning;Balance/vestibular training;Discharge planning;Functional electrical stimulation;Pain management;Skin care/wound management;Therapeutic Activities;UE/LE Coordination activities;Cognitive remediation/compensation;Disease management/prevention;Functional mobility training;Patient/family education;Splinting/orthotics;Therapeutic  Exercise;Visual/perceptual remediation/compensation   Therapy Documentation Precautions:  Precautions Precautions: Fall, Other (comment) Precaution Comments: Fear of falls, anxiety, back precautions (pt unable to verbalize any of 3) Required Braces or Orthoses: Knee Immobilizer - Right Knee Immobilizer - Right: On at all times Restrictions Weight Bearing Restrictions: Yes RLE Weight Bearing: Non weight bearing   Therapy/Group: Individual Therapy   C  10/04/2020, 5:07 PM  

## 2020-10-05 NOTE — Progress Notes (Signed)
Occupational Therapy Session Note  Patient Details  Name: Haley Roberts MRN: [id number] Date of Birth: May 18, 1945  Today's Date: 10/05/2020 OT Individual Time: 1430-1525 OT Individual Time Calculation (min): 55 min    Short Term Goals: Week 2:  OT Short Term Goal 1 (Week 2): Pt will complete slideboard transfers consistently with 1 assist in prep for [facility] transfer OT Short Term Goal 2 (Week 2): Pt will complete 2 grooming tasks sitting EOB with no more than supervision for balance assistance OT Short Term Goal 3 (Week 2): Pt will complete UB dressing with Min A while sitting unsupported   Skilled Therapeutic Interventions/Progress Updates:    Pt greeted at time of session sitting up in wheelchair, no pain, stating she needing to use bed pan. RN present as second helper throughout session. Slide board wheelchair > bed Min A overall with placement of block under L foot and initial assist to get started on board. Extended time for slide board transfer. Sit > supine Mod A. Doffed pants rolling L/R dependently and placed bed pan. Once finished, returned to pt and had accidentally spilled bed pan, soaking sheets and clothing. Rolling L/R with Min A with increased assist rolling to her L side due to R knee immobilizer for bed linen change, hygiene, and changing brief/pants. Max/total A for donning pants attempting to have pt use reacher and difficulty motor planning and problem solving. Remainder of session spent collaborating with pt making plan for tomorrow family ed and creating document to provide instructions for family and caregivers on ADL and transfer skill performance. Pt in bed resting alarm on call bell in reach.   Therapy Documentation Precautions:  Precautions Precautions: Fall, Other (comment) Precaution Comments: Fear of falls, anxiety, back precautions (pt unable to verbalize any of 3) Required Braces or Orthoses: Knee Immobilizer - Right Knee Immobilizer - Right: On at all  times Restrictions Weight Bearing Restrictions: Yes RLE Weight Bearing: Non weight bearing     Therapy/Group: Individual Therapy  [name] 10/05/2020, 4:42 PM

## 2020-10-05 NOTE — Progress Notes (Signed)
Patient ID: Haley Roberts, female   DOB: 1945/07/13, 75 y.o.   MRN: [id number]  [name], Wheelchair and [employer] ordered through [place].  [name], [place]

## 2020-10-05 NOTE — Progress Notes (Signed)
Occupational Therapy Weekly Progress Note  Patient Details  Name: Haley Roberts MRN: [id number] Date of Birth: Apr 07, 1945  OT Individual Time: 08:35-09:30 OT Individual Time Calculation (min): 55 min     Short Term Goals: Week 1:  OT Short Term Goal 1 (Week 1): Pt will sit EOB wiht CGA for sitting balance for 5 min OT Short Term Goal 1 - Progress (Week 1): Met OT Short Term Goal 2 (Week 1): Pt will SB consistently wiht MOD A to w/c in prep for BSC transfers OT Short Term Goal 2 - Progress (Week 1): Progressing toward goal OT Short Term Goal 3 (Week 1): Pt will roll in bed wiht MOD A to decrease BOC for LB dressing OT Short Term Goal 3 - Progress (Week 1): Met OT Short Term Goal 4 (Week 1): Pt will recall 3/3 back precautions wiht use of external aides as needed OT Short Term Goal 4 - Progress (Week 1): Met Week 2:  OT Short Term Goal 1 (Week 2): Pt will complete slideboard transfers consistently with 1 assist in prep for [facility] transfer OT Short Term Goal 2 (Week 2): Pt will complete 2 grooming tasks sitting EOB with no more than supervision for balance assistance OT Short Term Goal 3 (Week 2): Pt will complete UB dressing with Min A while sitting unsupported  Skilled Therapeutic Interventions/Progress Updates:    Pt received semi-supine in bed, no pain reported and agreeable to OT session focusing on self-care. LPN present for medication administration. Pt had difficulty coordinating pincer grasp to take meds and dropped several pills on bed. Declined need to change brief or wash LB. Slight shaking in hands noted throughout tx - pt stated it was from her "nerves". Demonstrated delayed processing and anxiety during any mobility. Verbalized 2/3 back precautions - recalled last one with VC. Mod A supine > EOB to facilitate shift into sitting and MOD cuing for log roll technique. Pt had difficulty performing lateral leans for removing pants and needed MAX multimodal cuing for weight shift.  Performed SB transfer EOB > wc with L LE under step and knee block from OTS with MOD A plus helper for scooting and lateral weight shift. Pt able to complete oral hygiene and UB bathing/dressing at sink level with setup. Frequently knocked items off sink when reaching. OTS/OT total assist for hair washing. Pt able to comb hair with setup and request for hair products. Pt left sitting in wc, alarm belt on, call bell nearby and needs met.   Therapy Documentation Precautions:  Precautions Precautions: Fall, Other (comment) Precaution Comments: Fear of falls, anxiety, back precautions (pt unable to verbalize any of 3) Required Braces or Orthoses: Knee Immobilizer - Right Knee Immobilizer - Right: On at all times Restrictions Weight Bearing Restrictions: Yes RLE Weight Bearing: Non weight bearing    Therapy/Group: Individual Therapy  Jasamine Pottinger 10/05/2020, 10:35 AM

## 2020-10-05 NOTE — Progress Notes (Signed)
Patient ID: Haley Roberts, female   DOB: [birth year], 75 y.o.   MRN: [id number] Team Conference Report to Patient/Family  Team Conference discussion was reviewed with the patient and caregiver, including goals, any changes in plan of care and target discharge date.  Patient and caregiver express understanding and are in agreement.  The patient has a target discharge date of 10/07/20.  Patient sons will be present for family education on tomorrow. SW informed son Corene Cornea) of current recommendations. DME ordered from [employer]: [facility], [employer], [facility] and slide board. SW will provided pt sons with a [place] list on tomorrow. SW ill begin sending out patient referral today for [facility] follow up for PT OT Aide SN for wound care. No additional questions or concerns, sw will cont to follow up.  Dyanne Iha 10/05/2020, 12:10 PM

## 2020-10-05 NOTE — Progress Notes (Signed)
Physical Therapy Session Note  Patient Details  Name: Haley Roberts MRN: [id number] Date of Birth: [birth year]  Today's Date: 10/05/2020 PT Individual Time: 4081-4481 PT Individual Time Calculation (min): 44 min   Short Term Goals: Week 3:  PT Short Term Goal 1 (Week 3): =LTGs d/t ELOS  Skilled Therapeutic Interventions/Progress Updates:     Pt received seated in [facility] and agrees to therapy. Reports 7/10 pain in R lower extremity. PT provides repositioning and rest breaks as needed to manage pain. WC transport to gym for time management.  Pt requires maxA to scoot to edge of WC so that L foot is in contact with floor, with PT cueing for lateral leans and body mechanics. Pt performs slideboard transfer to mat table with modA/maxA and increased time to complete.Seated on edge of mat, pt leans laterally to the R and L to place bed sheet under hips for decreased friction. Pt then practices posterior scooting by unweighting contralateral hip and pushing with upper extremities. Pt makes very minimal progress when physical assistance not provided (<1 cm total after multiple scoots). PT then utilizes sheet and manual cues to assist with posterior scooting so that pt able to attain long sitting position on mat. Wedge positioned behind pt and pt assisted into semi-fowler's position. Pt utilizes L lower extremity to perform unilateral bridging 3x10 with 10 count hold on final rep of each set. Performed for strengthening and NMR of L leg and core muscles. PT provides multimodal cueing for optimal body mechanics and force production/direction. Pt performs posterior to anterior scooting with modA and cues for sequencing and body mechanics. Slideboard transfer from elevated mat to WC with modA. Pt left seated in WC with alarm intact and all needs within reach.  Therapy Documentation Precautions: Precautions Precautions: Fall, Other (comment) Precaution Comments: Fear of falls, anxiety, back precautions (pt unable  to verbalize any of 3) Required Braces or Orthoses: Knee Immobilizer - Right Knee Immobilizer - Right: On at all times Restrictions Weight Bearing Restrictions: Yes RLE Weight Bearing: Non weight bearing   Therapy/Group: Individual Therapy  Breck Coons, PT, DPT 10/05/2020, 10:16 AM

## 2020-10-05 NOTE — Patient Care Conference (Signed)
Inpatient RehabilitationTeam Conference and Plan of Care Update Date: 10/05/2020   Time: 11:35 AM    Patient Name: Haley Roberts      Medical Record Number: [id number]  Date of Birth: 07-Nov-1945 Sex: Female         Room/Bed: 4W10C/4W10C-01 Payor Info: Payor: MEDICARE / Plan: MEDICARE PART A AND B / Product Type: *No Product type* /    Admit Date/Time:  09/17/2020  2:52 PM  Primary Diagnosis:  Status post lumbar surgery  Hospital Problems: Principal Problem:   Status post lumbar surgery Active Problems:   Hyponatremia   Acute lower UTI   Right leg pain   Acute blood loss anemia   Labile blood pressure    Expected Discharge Date: Expected Discharge Date: 10/07/20  Team Members Present: Physician leading conference: Dr. Delice Lesch Social Worker Present: [name], BSW Nurse Present: Dorthula Nettles, RN PT Present: Ailene Rud, PT OT Present: Lillia Corporal, OT PPS Coordinator present : [name], SLP     Current Status/Progress Goal Weekly Team Focus  Bowel/Bladder   cont x2, lbm 9/18  remain continence  timed toileting q 2hr   Swallow/Nutrition/ Hydration             ADL's   maximove  Min LB bathe/dress, Mod toileting, Min transfers  slide board transfers, family training!!, ADL retraining, AE training   Mobility   mod SB transfer +1 assist, //bars standing with heavy mod A, W/c mobility up to ~100 ft  mod A STS, min A transfers, supervision w/c  SB transfers, w/c mobility   Communication             Safety/Cognition/ Behavioral Observations            Pain   10/10 pain reported, Tylenol, Oxy IR, lidoderm patch available  To improve pain to atleast 3/10  assess pain q 4hr and prn   Skin   intact  Post operative wound healing and prevention of infection  assess skin q shift and prn     Discharge Planning:  Pt discharging home with son as primary caregiver. Avaliable 24/7   Team Discussion: Prafo ordered, BP stable. Continent B/B, LBM  9/18. Tylenol, Oxy IR, and Lidoderm patch available for 10/10 reported pain. Trazodone for sleep, and incision to back clean with appropriate dressing. Nursing educating on skin/wound care, pain, and medication management. Current barriers are pain, mobility, and anxiety. Discharging home with sons. Anti-anxiety medication working well however, patient seems to have plateaued. Max +2 for slide board, at a bedpan level, and puts on pants at the bed level. Mod/max assist standing only. Slide board transfers with regular therapists, not with others. May need to go home with hoyer lift and hospital bed due to slide board transfers.  Patient on target to meet rehab goals: no, patient seems to have plateaued and not making continued progress.  *See Care Plan and progress notes for long and short-term goals.   Revisions to Treatment Plan:  Prafo ordered.  Teaching Needs: Family education, medication management, pain management, skin/wound care, anxiety management, transfer training, hoyer lift training, mobility training, balance training, endurance training, safety awareness.  Current Barriers to Discharge: Decreased caregiver support, Medical stability, Home enviroment access/layout, Wound care, Lack of/limited family support, Weight bearing restrictions, and Medication compliance  Possible Resolutions to Barriers: Continue current medications for pain and anxiety management. Continue transfer and mobility training. Continue education with family.     Medical Summary Current Status: Functional and mobility deficits  secondary to lumbar stenosis s/p L3-4 , L4-5 lami  09/15/20 with previous supracondylar right femur fx and right calcaneus fx after fall  Barriers to Discharge: Neurogenic Bowel & Bladder;Decreased family/caregiver support;Home enviroment access/layout;Nutrition means;Weight;Medical stability;Wound care   Possible Resolutions to Barriers/Weekly Focus: Therapies, pt and family edu, cont  psych meds, follow labs - Hb   Continued Need for Acute Rehabilitation Level of Care: The patient requires daily medical management by a physician with specialized training in physical medicine and rehabilitation for the following reasons: Direction of a multidisciplinary physical rehabilitation program to maximize functional independence : Yes Medical management of patient stability for increased activity during participation in an intensive rehabilitation regime.: Yes Analysis of laboratory values and/or radiology reports with any subsequent need for medication adjustment and/or medical intervention. : Yes   I attest that I was present, lead the team conference, and concur with the assessment and plan of the team.   Cristi Loron 10/05/2020, 4:50 PM

## 2020-10-05 NOTE — Progress Notes (Signed)
Patient ID: Haley Roberts, female   DOB: 12-15-1945, 75 y.o.   MRN: [id number]  Patient 3 sons will attend education on tomorrow  [name], [place]

## 2020-10-05 NOTE — Progress Notes (Addendum)
PROGRESS NOTE   Subjective/Complaints: Patient seen sitting up at the edge of her bed this morning.  She states she slept well overnight.  Good sitting balance noted.  She is work with therapies.  Discussed orthoses with therapies.  ROS: Denies CP, SOB, N/V/D  Objective:   No results found. Recent Labs    10/04/20 0609  WBC 6.9  HGB 8.7*  HCT 27.0*  PLT 586*    Recent Labs    10/04/20 0609  NA 133*  K 4.3  CL 100  CO2 22  GLUCOSE 97  BUN 25*  CREATININE 0.69  CALCIUM 9.1     Intake/Output Summary (Last 24 hours) at 10/05/2020 1005 Last data filed at 10/05/2020 0902 Gross per 24 hour  Intake 360 ml  Output --  Net 360 ml         Physical Exam: Vital Signs Blood pressure (!) 143/81, pulse 85, temperature 98.4 F (36.9 C), temperature source Oral, resp. rate 16, height 5\' 2"  (1.575 m), weight 66 kg, SpO2 100 %. Constitutional: No distress . Vital signs reviewed. HENT: Normocephalic.  Atraumatic. Eyes: EOMI. No discharge. Cardiovascular: No JVD.  RRR. Respiratory: Normal effort.  No stridor.  Bilateral clear to auscultation. GI: Non-distended.  BS +. Skin: Warm and dry.  Intact. Psych: Normal mood.  Normal behavior. Musc: No edema in extremities.  No tenderness in extremities. Rheumatoid arthritis changes to bilateral hands and feet, unchanged Neurological: Alert and oriented Motor: 4+/5 bilateral upper extremities Sensation absent Right foot L5, S1 distribution  Right lower extremity: Hip flexion 1+/5, knee extension 2-/5, ankle dorsiflexion 1/5  Left lower extremity: 4/5 proximal distal   Assessment/Plan: 1. Functional deficits which require 3+ hours per day of interdisciplinary therapy in a comprehensive inpatient rehab setting. Physiatrist is providing close team supervision and 24 hour management of active medical problems listed below. Physiatrist and rehab team continue to assess barriers to  discharge/monitor patient progress toward functional and medical goals  Care Tool:  Bathing    Body parts bathed by patient: Right arm, Left arm, Chest, Abdomen, Right upper leg, Left upper leg, Face, Right lower leg, Left lower leg   Body parts bathed by helper: Front perineal area, Buttocks     Bathing assist Assist Level: Moderate Assistance - Patient 50 - 74%     Upper Body Dressing/Undressing Upper body dressing   What is the patient wearing?: Pull over shirt    Upper body assist Assist Level: Minimal Assistance - Patient > 75%    Lower Body Dressing/Undressing Lower body dressing      What is the patient wearing?: Pants     Lower body assist Assist for lower body dressing: Maximal Assistance - Patient 25 - 49%     Toileting Toileting    Toileting assist Assist for toileting: Total Assistance - Patient < 25%     Transfers Chair/bed transfer  Transfers assist  Chair/bed transfer activity did not occur: Safety/medical concerns  Chair/bed transfer assist level: Minimal Assistance - Patient > 75%     Locomotion Ambulation   Ambulation assist   Ambulation activity did not occur: Safety/medical concerns  Walk 10 feet activity   Assist  Walk 10 feet activity did not occur: Safety/medical concerns        Walk 50 feet activity   Assist Walk 50 feet with 2 turns activity did not occur: Safety/medical concerns         Walk 150 feet activity   Assist Walk 150 feet activity did not occur: Safety/medical concerns         Walk 10 feet on uneven surface  activity   Assist Walk 10 feet on uneven surfaces activity did not occur: Safety/medical concerns         Wheelchair     Assist Is the patient using a wheelchair?: Yes Type of Wheelchair: Manual Wheelchair activity did not occur: Safety/medical concerns  Wheelchair assist level: Set up assist Max wheelchair distance: 125'    Wheelchair 50 feet with 2 turns  activity    Assist    Wheelchair 50 feet with 2 turns activity did not occur: Safety/medical concerns   Assist Level: Contact Guard/Touching assist   Wheelchair 150 feet activity     Assist  Wheelchair 150 feet activity did not occur: Safety/medical concerns       Medical Problem List and Plan: 1.  Functional and mobility deficits secondary to lumbar stenosis s/p L3-4 , L4-5 lami  09/15/20 with previous supracondylar right femur fx and right calcaneus fx after fall  Continue CIR   PRAFO ordered  Team conference today to discuss current and goals and coordination of care, home and environmental barriers, and discharge planning with nursing, case manager, and therapies. Please see conference note from today as well.  2.  Antithrombotics: -DVT/anticoagulation:  Mechanical: Sequential compression devices, below knee Bilateral lower extremities  9/8- on Lovenox daily             -antiplatelet therapy: N/A 3. Pain Management: Used hydrocodone 10 mg TID PTA. On prednisone taper with Oxycontin 10 mg bid and oxycodone prn.              --Flexeril TID, Gabapentin   Flexeril 10 mg TID and Lidoderm patches to neck 8pm to 8am  Controlled on 9/20 4. Mood: Team to provide ego support. [name] 5 mg TID for anxiety Valium 5 mg q56 hours prn for spasms, but could help anxiety?  Improved 11/20             -antipsychotic agents: N/A 5. Neuropsych: This patient is capable of making decisions on her own behalf. 6. Skin/Wound Care: Routine pressure relief measures. Monitor incision for healing.  7. Fluids/Electrolytes/Nutrition:  Monitor I/O.  KCL 10 mEq daily since on HCTZ  Added megace 9/13- prealbumin is 11.2 8. HTN: Monitor BP TID--continue HCTZ and Cozaar  Controlled for the most part on 9/20 9. RA: Continue Arava daily with chronic prednisone (higher doses with taper currently).  10. H/o depression: Continue Wellbutrin and Lexapro.  11. Leukocytosis:  Completed course of cefdinir   12. Pre-diabetes: Hyperglycemia due to increase in steroids. Will monitor FBS for now.  13. Hypocalcemia: Added Calcium supplement.  14. Constipation: Added senna S two tabs with supper. Added Sorbitol   Improving on 9/14 15. Urinary retention:  Cannot start Flomax- had seizures with Sulfa Started Bethanachol 10 mg TID for now as well as treatment for UTI 16. Low albumin: add Juven to promote wound healing 17. Hyponatremia  Sodium 133 on 9/19  Continue to monitor 18.  Acute blood loss anemia  Hemoglobin 8.7 on 9/19 19.  Glucocorticoid associated osteoporosis-  now on low dose prednisone  LOS: 18 days A FACE TO FACE EVALUATION WAS PERFORMED  Haley Roberts Haley Roberts 10/05/2020, 10:05 AM

## 2020-10-05 NOTE — Progress Notes (Signed)
Orthopedic Tech Progress Note Patient Details:  Haley Roberts 04/06/1945 [id number]  Called in order to [employer] for a WALKING PRAFO  Patient ID: Haley Roberts, female   DOB: 07-22-1945, 75 y.o.   MRN: [id number]  [name] 10/05/2020, 11:01 AM

## 2020-10-05 NOTE — Progress Notes (Signed)
Patient ID: Haley Roberts, female   DOB: [birth year], 75 y.o.   MRN: [id number]  Hospital Bed and Drop Arm Commode ordered through [employer]. No Pt recommendations  [name], [place]

## 2020-10-05 NOTE — Progress Notes (Signed)
Patient ID: Haley Roberts, female   DOB: May 29, 1945, 75 y.o.   MRN: [id number]  Medicare [facility] agency list provided  [name], [place]

## 2020-10-05 NOTE — Progress Notes (Signed)
Physical Therapy Session Note  Patient Details  Name: Haley Roberts MRN: [id number] Date of Birth: 10/27/1945  Today's Date: 10/05/2020 PT Individual Time: 1100-1129 PT Individual Time Calculation (min): 29 min   Short Term Goals: Week 1:  PT Short Term Goal 1 (Week 1): Pt will perform supine<>sit with +2 max assist PT Short Term Goal 1 - Progress (Week 1): Met PT Short Term Goal 2 (Week 1): Pt will perform bed<>chair transfers using LRAD with +2 max assist PT Short Term Goal 2 - Progress (Week 1): Met PT Short Term Goal 3 (Week 1): Pt will tolerate upright, OOB sitting for at least 1 hour between therapy sessions PT Short Term Goal 3 - Progress (Week 1): Met PT Short Term Goal 4 (Week 1): Pt will recall 2/3 spine precautions without cuing PT Short Term Goal 4 - Progress (Week 1): Met PT Short Term Goal 5 (Week 1): Pt will propel wheelchair 20ft with min assist PT Short Term Goal 5 - Progress (Week 1): Not met Week 2:  PT Short Term Goal 1 (Week 2): Pt will perform bed<>chair transfer with asisst of 1 PT Short Term Goal 1 - Progress (Week 2): Met PT Short Term Goal 2 (Week 2): Pt will initate standing mobility PT Short Term Goal 2 - Progress (Week 2): Met PT Short Term Goal 3 (Week 2): Pt will initiate w/c propulsion training PT Short Term Goal 3 - Progress (Week 2): Met Week 3:  PT Short Term Goal 1 (Week 3): =LTGs d/t ELOS  Skilled Therapeutic Interventions/Progress Updates:    Pt seated in w/c on arrival and agreeable to therapy. No complaint of pain. Pt performed the following exercises at w/c level to promote strength and endurance:  - bicep curl 4 lb bar 4 x 20, while maintaining upright seated posture in w/c -LAQ with cues to keep L foot in neutral throughout, x 5 - 4lb bar bicep curl to chest press, 2 x 20  - RLE dorsiflexion AAROM x 10   Pt remained in w/c at end of session and was left with all needs in reach and alarm active.   Therapy Documentation Precautions:   Precautions Precautions: Fall, Other (comment) Precaution Comments: Fear of falls, anxiety, back precautions (pt unable to verbalize any of 3) Required Braces or Orthoses: Knee Immobilizer - Right Knee Immobilizer - Right: On at all times Restrictions Weight Bearing Restrictions: Yes RLE Weight Bearing: Non weight bearing General:   Vital Signs:  Pain:   Mobility:   Locomotion :    Trunk/Postural Assessment :    Balance:   Exercises:   Other Treatments:      Therapy/Group: Individual Therapy   C  10/05/2020, 11:11 AM  

## 2020-10-05 NOTE — Progress Notes (Deleted)
PROGRESS NOTE   Subjective/Complaints: Patient seen sitting up at the edge of her bed this morning.  She states she slept well overnight.  Good sitting balance noted.  She is work with therapies.  Discussed orthoses with therapies.  ROS: Denies CP, SOB, N/V/D  Objective:   No results found. Recent Labs    10/04/20 0609  WBC 6.9  HGB 8.7*  HCT 27.0*  PLT 586*    Recent Labs    10/04/20 0609  NA 133*  K 4.3  CL 100  CO2 22  GLUCOSE 97  BUN 25*  CREATININE 0.69  CALCIUM 9.1     Intake/Output Summary (Last 24 hours) at 10/05/2020 1136 Last data filed at 10/05/2020 0902 Gross per 24 hour  Intake 360 ml  Output --  Net 360 ml         Physical Exam: Vital Signs Blood pressure (!) 143/81, pulse 85, temperature 98.4 F (36.9 C), temperature source Oral, resp. rate 16, height 5\' 2"  (1.575 m), weight 66 kg, SpO2 100 %. Constitutional: No distress . Vital signs reviewed. HENT: Normocephalic.  Atraumatic. Eyes: EOMI. No discharge. Cardiovascular: No JVD.  RRR. Respiratory: Normal effort.  No stridor.  Bilateral clear to auscultation. GI: Non-distended.  BS +. Skin: Warm and dry.  Intact. Psych: Normal mood.  Normal behavior. Musc: No edema in extremities.  No tenderness in extremities. Rheumatoid arthritis changes to bilateral hands and feet, unchanged Neurological: Alert and oriented Motor: 4+/5 bilateral upper extremities Sensation absent Right foot L5, S1 distribution  Right lower extremity: Hip flexion 1+/5, knee extension 2-/5, ankle dorsiflexion 1/5  Left lower extremity: 4/5 proximal distal   Assessment/Plan: 1. Functional deficits which require 3+ hours per day of interdisciplinary therapy in a comprehensive inpatient rehab setting. Physiatrist is providing close team supervision and 24 hour management of active medical problems listed below. Physiatrist and rehab team continue to assess barriers to  discharge/monitor patient progress toward functional and medical goals  Care Tool:  Bathing    Body parts bathed by patient: Right arm, Left arm, Chest, Abdomen, Right upper leg, Left upper leg, Face, Right lower leg, Left lower leg   Body parts bathed by helper: Front perineal area, Buttocks     Bathing assist Assist Level: Moderate Assistance - Patient 50 - 74%     Upper Body Dressing/Undressing Upper body dressing   What is the patient wearing?: Pull over shirt    Upper body assist Assist Level: Minimal Assistance - Patient > 75%    Lower Body Dressing/Undressing Lower body dressing      What is the patient wearing?: Pants     Lower body assist Assist for lower body dressing: Maximal Assistance - Patient 25 - 49%     Toileting Toileting    Toileting assist Assist for toileting: Total Assistance - Patient < 25%     Transfers Chair/bed transfer  Transfers assist  Chair/bed transfer activity did not occur: Safety/medical concerns  Chair/bed transfer assist level: Minimal Assistance - Patient > 75%     Locomotion Ambulation   Ambulation assist   Ambulation activity did not occur: Safety/medical concerns  Walk 10 feet activity   Assist  Walk 10 feet activity did not occur: Safety/medical concerns        Walk 50 feet activity   Assist Walk 50 feet with 2 turns activity did not occur: Safety/medical concerns         Walk 150 feet activity   Assist Walk 150 feet activity did not occur: Safety/medical concerns         Walk 10 feet on uneven surface  activity   Assist Walk 10 feet on uneven surfaces activity did not occur: Safety/medical concerns         Wheelchair     Assist Is the patient using a wheelchair?: Yes Type of Wheelchair: Manual Wheelchair activity did not occur: Safety/medical concerns  Wheelchair assist level: Set up assist Max wheelchair distance: 125'    Wheelchair 50 feet with 2 turns  activity    Assist    Wheelchair 50 feet with 2 turns activity did not occur: Safety/medical concerns   Assist Level: Contact Guard/Touching assist   Wheelchair 150 feet activity     Assist  Wheelchair 150 feet activity did not occur: Safety/medical concerns       Medical Problem List and Plan: 1.  Functional and mobility deficits secondary to lumbar stenosis s/p L3-4 , L4-5 lami  09/15/20 with previous supracondylar right femur fx and right calcaneus fx after fall  Continue CIR   PRAFO ordered  Team conference today to discuss current and goals and coordination of care, home and environmental barriers, and discharge planning with nursing, case manager, and therapies. Please see conference note from today as well.  2.  Antithrombotics: -DVT/anticoagulation:  Mechanical: Sequential compression devices, below knee Bilateral lower extremities  9/8- on Lovenox daily             -antiplatelet therapy: N/A 3. Pain Management: Used hydrocodone 10 mg TID PTA. On prednisone taper with Oxycontin 10 mg bid and oxycodone prn.              --Flexeril TID, Gabapentin   Flexeril 10 mg TID and Lidoderm patches to neck 8pm to 8am  Controlled on 9/20 4. Mood: Team to provide ego support. [name] 5 mg TID for anxiety Valium 5 mg q56 hours prn for spasms, but could help anxiety?  Improved 11/20             -antipsychotic agents: N/A 5. Neuropsych: This patient is capable of making decisions on her own behalf. 6. Skin/Wound Care: Routine pressure relief measures. Monitor incision for healing.  7. Fluids/Electrolytes/Nutrition:  Monitor I/O.  KCL 10 mEq daily since on HCTZ  Added megace 9/13- prealbumin is 11.2 8. HTN: Monitor BP TID--continue HCTZ and Cozaar  Controlled for the most part on 9/20 9. RA: Continue Arava daily with chronic prednisone (higher doses with taper currently).  10. H/o depression: Continue Wellbutrin and Lexapro.  11. Leukocytosis:  Completed course of cefdinir   12. Pre-diabetes: Hyperglycemia due to increase in steroids. Will monitor FBS for now.  13. Hypocalcemia: Added Calcium supplement.  14. Constipation: Added senna S two tabs with supper. Added Sorbitol   Improving on 9/14 15. Urinary retention:  Cannot start Flomax- had seizures with Sulfa Started Bethanachol 10 mg TID for now as well as treatment for UTI 16. Low albumin: add Juven to promote wound healing 17. Hyponatremia  Sodium 133 on 9/19  Continue to monitor 18.  Acute blood loss anemia  Hemoglobin 8.7 on 9/19 19.  Glucocorticoid associated osteoporosis-  now on low dose prednisone  LOS: 18 days A FACE TO FACE EVALUATION WAS PERFORMED  Kumari Sculley [name] 10/05/2020, 11:36 AM

## 2020-10-06 DIAGNOSIS — G8918 Other acute postprocedural pain: Secondary | ICD-10-CM

## 2020-10-06 MED ORDER — OXYCODONE HCL ER 10 MG PO T12A
10.0000 mg | EXTENDED_RELEASE_TABLET | Freq: Every day | ORAL | Status: DC
  Administered 2020-10-07: 10 mg via ORAL
  Filled 2020-10-06: qty 1

## 2020-10-06 NOTE — Discharge Summary (Signed)
Physician Discharge Summary  Patient ID: Haley Roberts MRN: [id number] DOB/AGE: 04-10-1945 75 y.o.  Admit date: 09/17/2020 Discharge date: 10/07/2020  Discharge Diagnoses:  Principal Problem:   Status post lumbar surgery Active Problems:   Depression   Essential hypertension   Rheumatoid arthritis ([place])   Hyponatremia   Acute lower UTI   Right leg pain   Acute blood loss anemia   Post-operative pain   Situational anxiety   Discharged Condition: stable  Significant Diagnostic Studies: DG Chest 2 View  Result Date: 09/18/2020 CLINICAL DATA:  Fever.  Post lumbar fusion 09/15/2020 EXAM: CHEST - 2 VIEW COMPARISON:  09/05/2010 FINDINGS: Upper normal heart size. Mild aortic tortuosity and atherosclerosis. Streaky retrocardiac opacities typical of atelectasis. No confluent airspace disease. No pleural effusion or pneumothorax. No pulmonary edema. Thoracic spondylosis with endplate spurring IMPRESSION: 1. Streaky retrocardiac opacities typical of atelectasis. 2. Mild aortic tortuosity and atherosclerosis. Electronically Signed   By: Keith Rake M.D.   On: 09/18/2020 20:43    Labs:  Basic Metabolic Panel: BMP Latest Ref Rng & Units 10/04/2020 09/27/2020 09/24/2020  Glucose 70 - 99 mg/dL 97 94 98  BUN 8 - 23 mg/dL 25(H) 16 26(H)  Creatinine 0.44 - 1.00 mg/dL 0.69 0.59 0.50  Sodium 135 - 145 mmol/L 133(L) 134(L) 133(L)  Potassium 3.5 - 5.1 mmol/L 4.3 3.7 3.5  Chloride 98 - 111 mmol/L 100 97(L) 92(L)  CO2 22 - 32 mmol/L 22 27 29   Calcium 8.9 - 10.3 mg/dL 9.1 9.3 10.0     CBC: CBC Latest Ref Rng & Units 10/04/2020 09/27/2020 09/24/2020  WBC 4.0 - 10.5 K/uL 6.9 9.2 11.9(H)  Hemoglobin 12.0 - 15.0 g/dL 8.7(L) 9.2(L) 9.4(L)  Hematocrit 36.0 - 46.0 % 27.0(L) 29.4(L) 28.9(L)  Platelets 150 - 400 K/uL 586(H) 653(H) 530(H)      CBG: No results for input(s): GLUCAP in the last 168 hours.  Brief HPI:   Haley Roberts is a 75 y.o. female with history of RA, chronic low back pain,  recent fall with nondisplaced periprosthetic fracture of right medial femoral condyle and avulsion fracture right anterior calcaneus, progressive low back pain who was admitted on 09/09/2020 with pain radiating to bilateral buttocks and numbness and tingling.  MRI lumbar spine done revealing advanced facet arthropathy with degenerative anterolisthesis L4/5 with severe multifactorial spinal stenosis and question of neural compression of central canal and facet arthropathy with degenerative anterolisthesis L3/4 and L4-5/S1.  Ortho recommended continuing knee immobilizer with NWB RLE and prednisone was increased to 60 mg with slow taper for pain control.  She did initially decline surgery however continued to have limitations with significant pain therefore Dr. Christella Noa was consulted for input.  Patient elected to undergo L3/4 and L4/5 decompressive Lam on 08/31.  She continued to be limited by nonweightbearing RLE as well as numbness and tingling, balance deficits as well as anxiety affecting overall functional status.  CIR was recommended for progressive therapies.   Hospital Course: Haley Roberts was admitted to rehab 09/17/2020 for inpatient therapies to consist of PT and OT at least three hours five days a week. Past admission physiatrist, therapy team and rehab RN have worked together to provide customized collaborative inpatient rehab. Marland Kitchen She was noted to be febile with T-max 101 on 09/03 and was pan cultured. CXR, UA, BC were essentially negative therefore no antibiotics initiated.  Juven was added to help with low calorie malnutrition and promote wound healing.  Bladder training was initiated with bladder scans/PVR checks  showing evidence of retention.  Her symptoms were copounded by constipation requiring augmentation of bowel regimen. Urecholine as added to help with voiding and retention issues. She developed wound drainage with mild dehiscence on 09/04 and was started on keflex for wound prophylaxis.     CBC done showed rise in WBC and she was found to have UTI with positive UA.  Keflex was changed to cefdinir x7 days for broader coverage. Prednisone was tapered down to home dose of 5 mg/day. She has had issues with anxiety limiting therapy therefore [name] for mood stabilization.  She was also started on OxyContin and Flexeril for pain management.  Issues with lethargy and disorientation were felt to be multifactorial and have resolved.She did have a drop in sodium to 129 therefore was placed on fluid restriction of 1800 cc/day.  Serial check of electrolytes shows improvement in sodium to 133 and she was advised to continue fluid restriction at discharge.    Calcium supplement was added due to hypocalcemia.  Bowel program has been augmented during her stay with good results. Voiding function has improved and she is voiding without difficulty difficulty. Drainage as well as wound dehiscence has resolved and incision is healing well without signs of infection.  Back pain has resolved with decreasing use on breakthrough medicine therefore OxyContin was decreased to once a day at discharge with plans to taper off.  Her progress has been limited by her nonweightbearing status with knee immobilizer as well as back precautions.  She currently requires min assist overall and will continue to receive follow-up home health PT, OT, Aide and RN by [facility] after discharge.     Rehab course: During patient's stay in rehab weekly team conferences were held to monitor patient's progress, set goals and discuss barriers to discharge. At admission, patient required + 2 total assist with mobility and max assist with basic ADL tasks. She  has had improvement in activity tolerance, balance, postural control as well as ability to compensate for deficits. She requires supervision with UB care, mod to max assist for LB care and total assist for toileting. She is able to perform SB transfers with CGA/Min Assist after SB  set up. Family has been very supportive and education has been done regarding all aspects of care and safety.    Discharge disposition: 01-Home or Self Care  Diet: Regular/1800 cc fluid restriction.   Special Instructions: Continue NWB on RLE. Wear KI at all times Need to increase fluid intake. Recommend repeat BMET/CBC in 1-2 weeks for follow up on H/H and sodium  Continue NWB RLE with KI at all times.   Discharge Instructions     Ambulatory referral to Physical Medicine Rehab   Complete by: As directed    Hospital follow up      Allergies as of 10/07/2020       Reactions   Infliximab Shortness Of Breath, Other (See Comments), Hypertension   Remicade   Sulfa Antibiotics Other (See Comments)   Convulsions   Sulfacetamide Sodium-sulfur Other (See Comments)   Convulsions   Sulfonamide Derivatives Other (See Comments)   BAD HEADACHES/CHILLS/FEVER and CONVULSIONS with Sulfa antibiotics and Sulfacetamide Sodium-Sulfur        Medication List     STOP taking these medications    CALTRATE PLUS PO   hydrochlorothiazide 12.5 MG tablet Commonly known as: HYDRODIURIL   HYDROcodone-acetaminophen 10-325 MG tablet Commonly known as: NORCO   methocarbamol 500 MG tablet Commonly known as: ROBAXIN  TAKE these medications    acetaminophen 325 MG tablet Commonly known as: TYLENOL Take 1-2 tablets (325-650 mg total) by mouth every 4 (four) hours as needed for mild pain.   bethanechol 10 MG tablet Commonly known as: URECHOLINE Take 1 tablet (10 mg total) by mouth 4 (four) times daily. Notes to patient: For bladder   buPROPion 300 MG 24 hr tablet Commonly known as: WELLBUTRIN XL Take 300 mg by mouth daily.   busPIRone 5 MG tablet Commonly known as: [name] Take 1 tablet (5 mg total) by mouth 3 (three) times daily. Notes to patient: For anxiety   calcium-vitamin D 500-200 MG-UNIT tablet Commonly known as: OSCAL WITH D Take 1 tablet by mouth 2 (two) times  daily. Notes to patient: For low calcium levels   cyclobenzaprine 5 MG tablet Commonly known as: FLEXERIL Take 1 tablet (5 mg total) by mouth 3 (three) times daily. Notes to patient: For muscle spasms   escitalopram 20 MG tablet Commonly known as: LEXAPRO Take 20 mg by mouth daily.   leflunomide 10 MG tablet Commonly known as: ARAVA Take 10 mg by mouth daily.   lidocaine 5 % Commonly known as: LIDODERM Place 2 patches onto the skin daily. Apply at 8 am and remove at 8 pm daily Notes to patient: Purchase over the counter if insurance dose not cover this   losartan 100 MG tablet Commonly known as: COZAAR Take 1 tablet (100 mg total) by mouth daily.   megestrol 400 MG/10ML suspension Commonly known as: MEGACE Take 10 mLs (400 mg total) by mouth 2 (two) times daily.   polyethylene glycol 17 g packet Commonly known as: MIRALAX / GLYCOLAX Take 17 g by mouth 2 (two) times daily.   potassium chloride 10 MEQ tablet Commonly known as: KLOR-CON Take 1 tablet (10 mEq total) by mouth daily.   predniSONE 5 MG tablet Commonly known as: DELTASONE Take 5 mg by mouth daily.   senna-docusate 8.6-50 MG tablet Commonly known as: Senokot-S Take 2 tablets by mouth daily after supper.   vitamin C 500 MG tablet Commonly known as: ASCORBIC ACID Take 500 mg by mouth daily.   vitamin E 180 MG (400 UNITS) capsule Take 400 Units by mouth daily.   Womens Daily Formula Tabs Take 1 tablet by mouth daily.   Xtampza ER 9 MG C12a--Rx# 7 pills Generic drug: oxyCODONE ER Take 9 mg by mouth daily. Notes to patient: Being tapered        Follow-up Information     Lovorn, Jinny Blossom, MD Follow up.   Specialty: Physical Medicine and Rehabilitation Why: office will call you with follow up appointment Contact information: [address] [place] [address] [phone]         Hennie Duos, MD Follow up.   Specialty: Rheumatology Contact information: [address] [place] [address] [phone]         Ashok Pall, MD. Call.   Specialty: Neurosurgery Why: for post op appointment Contact information: [address] [place] [address] [place] [address] [phone]         Binnie Rail, MD. Call.   Specialty: Internal Medicine Why: for post hospital follow up Contact information: [place] [address] [phone]         Paralee Cancel, MD. Call.   Specialty: Orthopedic Surgery Why: FOR FOLLOW UP ON RIGHT LEG Contact information: [address] [place] [address] [phone]  Signed: Bary Leriche 10/07/2020, 12:01 PM

## 2020-10-06 NOTE — Progress Notes (Signed)
Occupational Therapy Session Note  Patient Details  Name: Haley Roberts MRN: [id number] Date of Birth: [birth year]  Today's Date: 10/06/2020 OT Individual Time: 2248-2500 and 1300-1359 OT Individual Time Calculation (min): 43 min and 59 min   Short Term Goals: Week 2:  OT Short Term Goal 1 (Week 2): Pt will complete slideboard transfers consistently with 1 assist in prep for [facility] transfer OT Short Term Goal 2 (Week 2): Pt will complete 2 grooming tasks sitting EOB with no more than supervision for balance assistance OT Short Term Goal 3 (Week 2): Pt will complete UB dressing with Min A while sitting unsupported   Skilled Therapeutic Interventions/Progress Updates:    Session 1: Pt greeted at time of session resting bed level, agreeable to OT session and bed level ADL prior to getting OOB. No pain. Bed level LB bathing Mod A overall with use of LHS for BLEs past knee level, needing assist to don/doff KI and keep knee straight to wash under KI. LB dress Max A to thread and use reacher to pull up, rolling L/R to don over hips while holding on to bed rail. Slide board bed > wheelchair Min A and having pt perform teach back for technique for max carryover. Discussed potential use of hoyer as well for back up at home and pt agreeable. RT present as well during session for emotional coping and anxiety management. Pt self propel to sink and Mod I for oral hygiene and face washing. Alarm on call bell in reach, up in chair.    Session 2: Pt greeted at time of session supine in bed resting with 2 sons present for family ed and training. Initial part of session spent on educating family on precautions and verbally reviewing CLOF with ADLs. Note family and pt stating there will be a hired caregiver assisting with ADLs and declined specific training on bathing, dressing, and toileting. Provided family with hand out with detailed instructions for all self care and mobility. Therapist performing slide board  transfer with pt bed > wheelchair Min/CGA after board placement, both sons assisting with transfer as well x1 each. Good carryover for foot placement on block and cuing the pt. Wheelchair transport > gym and educated on [employer] use, therapist acting as the pt. Educated on placement of pad, how to transfer pt, and hips relationship to wheelchair. Both sons want to continue to keep hoyer as a backup in case slide boards are not functional at home/inconsistent. Discussed with SW as well for home aide for pt, going to provide hand out for agencies. Hand off to PT in gym for continued family ed.   Therapy Documentation Precautions:  Precautions Precautions: Fall, Other (comment) Precaution Comments: Fear of falls, anxiety, back precautions (pt unable to verbalize any of 3) Required Braces or Orthoses: Knee Immobilizer - Right Knee Immobilizer - Right: On at all times Restrictions Weight Bearing Restrictions: Yes RLE Weight Bearing: Non weight bearing    Therapy/Group: Individual Therapy  [name] 10/06/2020, 7:03 AM

## 2020-10-06 NOTE — Progress Notes (Signed)
Physical Therapy Session Note  Patient Details  Name: Haley Roberts MRN: [id number] Date of Birth: [birth year]  Today's Date: 10/06/2020 PT Individual Time: 1100-1130, 1400-1500 PT Individual Time Calculation (min): 30 min, 60 min   Short Term Goals: Week 1:  PT Short Term Goal 1 (Week 1): Pt will perform supine<>sit with +2 max assist PT Short Term Goal 1 - Progress (Week 1): Met PT Short Term Goal 2 (Week 1): Pt will perform bed<>chair transfers using LRAD with +2 max assist PT Short Term Goal 2 - Progress (Week 1): Met PT Short Term Goal 3 (Week 1): Pt will tolerate upright, OOB sitting for at least 1 hour between therapy sessions PT Short Term Goal 3 - Progress (Week 1): Met PT Short Term Goal 4 (Week 1): Pt will recall 2/3 spine precautions without cuing PT Short Term Goal 4 - Progress (Week 1): Met PT Short Term Goal 5 (Week 1): Pt will propel wheelchair 14f with min assist PT Short Term Goal 5 - Progress (Week 1): Not met Week 2:  PT Short Term Goal 1 (Week 2): Pt will perform bed<>chair transfer with asisst of 1 PT Short Term Goal 1 - Progress (Week 2): Met PT Short Term Goal 2 (Week 2): Pt will initate standing mobility PT Short Term Goal 2 - Progress (Week 2): Met PT Short Term Goal 3 (Week 2): Pt will initiate w/c propulsion training PT Short Term Goal 3 - Progress (Week 2): Met  Skilled Therapeutic Interventions/Progress Updates:    Session 1: Pt seated in w/c on arrival and agreeable to therapy. No complaint of pain. Pt performed slideboard transfer with min A to place board and position LLE throughout transfer. Pt required min VC for lateral leans x 4 to maintain back precautions. Pt directed in therapeutic exercise for LE strength and coordination. Pt performed 2 x 10 LAQ with focus on maintaining neutral foot position during movement. Ankle pumps x 10 on RLE with active assist into dorsiflexion. Pt reports improved motion in BIL ankles. Sit>supine with mod A to manage  LE. Max a [facility] was left with all needs in reach and alarm active.   Session 2: Pt received in therapy gym at end of OT session. Session focused on family education with pt's sons, Haley Evertand Haley Roberts Pt performed slideboard transfer w/c<>mat table x 3 with min A for board placement and to stabilize LLE. Pt demoed good recall and self correction during transfers. Pt's sons both demoed appropriate and safe  assistance during transfers. Pt propelled w/c with BUE x 150 ft to pt room with supervision for endurance and functional mobility. slideboard transfer w/c>bed in same manner as above to return to bed. Max A to scoot toward head of bed. Pt and family education provided during rest breaks, including back precautions, knee immobilizer,expectations for home. Pt remained in bed at end of session with her family present and was left with all needs in reach and alarm active.   Therapy Documentation Precautions:  Precautions Precautions: Fall, Other (comment) Precaution Comments: Fear of falls, anxiety, back precautions (pt unable to verbalize any of 3) Required Braces or Orthoses: Knee Immobilizer - Right Knee Immobilizer - Right: On at all times Restrictions Weight Bearing Restrictions: Yes RLE Weight Bearing: Non weight bearing     Therapy/Group: Individual Therapy  OMickel Fuchs9/21/2022, 12:42 PM

## 2020-10-06 NOTE — Progress Notes (Signed)
Physical Therapy Discharge Summary  Patient Details  Name: Haley Roberts MRN: [id number] Date of Birth: 1945/10/09     Patient has met 7 of 8 long term goals due to improved activity tolerance, improved balance, improved postural control, increased strength, decreased pain, ability to compensate for deficits, improved awareness, and improved coordination.  Patient to discharge at a wheelchair level [place].   Patient's care partner is independent to provide the necessary physical assistance at discharge. Pt performs rolling with min A, supine<>sit with mod A for LE management. Slideboard transfers with min A for board placement and to stabilize LLE. Pt's sons, Gerald Stabs and Ysidro Evert, were trained on a manual hoyer lift and assisting with slideboard transfers. Both demoed safe guarding and assistance techniques.   Reasons goals not met: Car transfer goal not met, pt required assist to maintain LLE position which is prohibitive to car transfer. Pt's family already owns handicap Lucianne Lei with w/c lift.   Recommendation:  Patient will benefit from ongoing skilled PT services in home health setting to continue to advance safe functional mobility, address ongoing impairments in strength, functional mobility, balance, coordination, motor planning, and minimize fall risk.  Equipment: RW, w/c  Reasons for discharge: treatment goals met and discharge from hospital  Patient/family agrees with progress made and goals achieved: Yes  PT Discharge Precautions/Restrictions Precautions Precautions: Fall Precaution Comments: Fear of falls, anxiety, back precautions, KI all times Required Braces or Orthoses: Knee Immobilizer - Right Knee Immobilizer - Right: On at all times Restrictions Weight Bearing Restrictions: Yes RLE Weight Bearing: Non weight bearing Vital Signs Therapy Vitals Temp: 98.2 F (36.8 C) Temp Source: Oral Pulse Rate: 90 Resp: 16 BP: 107/65 Patient Position (if appropriate):  Lying Oxygen Therapy SpO2: 96 % O2 Device: Room Air Pain Pain Assessment Pain Scale: 0-10 Pain Score: 2  Pain Interference Pain Interference Pain Effect on Sleep: 1. Rarely or not at all Pain Interference with Therapy Activities: 1. Rarely or not at all Pain Interference with Day-to-Day Activities: 2. Occasionally Vision/Perception  Vision - History Ability to See in Adequate Light: 1 Impaired Perception Perception: Within Functional Limits Praxis Praxis: Impaired Praxis Impairment Details: Initiation;Motor planning Praxis-Other Comments: greatly improved from baseline but not WFL  Cognition Overall Cognitive Status: Within Functional Limits for tasks assessed Arousal/Alertness: Awake/alert Orientation Level: Oriented X4 Year: 2022 Month: September Day of Week: Correct Attention: Focused;Sustained Focused Attention: Appears intact Sustained Attention: Appears intact Memory: Appears intact Awareness: Appears intact Problem Solving: Appears intact Safety/Judgment: Impaired Sensation Sensation Light Touch: Impaired Detail Peripheral sensation comments: impaired in BLEs R>L Light Touch Impaired Details: Impaired RLE Proprioception: Impaired Detail Proprioception Impaired Details: Impaired RLE;Impaired LLE Coordination Gross Motor Movements are Fluid and Coordinated: No Fine Motor Movements are Fluid and Coordinated: No Coordination and Movement Description: RA in B UEs, impaired movements due to R LE NWB, spine precautions, and BLE weakenss but improved from eval Motor  Motor Motor: Abnormal postural alignment and control;Paraplegia Motor - Skilled Clinical Observations: B LE weakness with R>L  Mobility Bed Mobility Bed Mobility: Sit to Supine;Supine to Sit;Rolling Right;Right Sidelying to Sit Rolling Right: Minimal Assistance - Patient > 75% Rolling Left: Minimal Assistance - Patient > 75% Right Sidelying to Sit: Moderate Assistance - Patient 50-74% Supine to  Sit: Moderate Assistance - Patient 50-74% Sit to Supine: Moderate Assistance - Patient 50-74% Transfers Transfers: Lateral/Scoot Transfers Lateral/Scoot Transfers: Minimal Assistance - Patient > 75% Transfer (Assistive device): Other (Comment) (slideboard) Locomotion  Gait Ambulation: No Gait Gait: No Stairs /  Additional Locomotion Stairs: No Pick up small object from the floor (from standing position) activity did not occur: Safety/medical concerns Product manager Mobility: Yes Wheelchair Assistance: Chartered loss adjuster: Both upper extremities Wheelchair Parts Management: Needs assistance Distance: 150 ft  Trunk/Postural Assessment  Cervical Assessment Cervical Assessment: Exceptions to [facility] Thoracic Assessment Thoracic Assessment: Exceptions to [facility] Lumbar Assessment Lumbar Assessment: Exceptions to [facility] Postural Control Postural Control: Within Functional Limits Righting Reactions: delayed but improved from baseline  Balance Balance Balance Assessed: Yes Static Sitting Balance Static Sitting - Balance Support: Feet supported;Bilateral upper extremity supported Static Sitting - Level of Assistance: 5: Stand by assistance Dynamic Sitting Balance Dynamic Sitting - Balance Support: Feet supported Dynamic Sitting - Level of Assistance: 5: Stand by assistance Dynamic Sitting - Balance Activities: Lateral lean/weight shifting;Forward lean/weight shifting Extremity Assessment  RUE Assessment RUE Assessment: Exceptions to [facility] General Strength Comments: generalized weakness/RA in shoulders and hands LUE Assessment LUE Assessment: Exceptions to [facility] General Strength Comments: generalized weakness and limited by RA RLE Assessment RLE Assessment: Exceptions to [facility] Passive Range of Motion (PROM) Comments: limited ankle DF ROM, limited hip abduction due to pain General Strength Comments: unable to assess knee flexion/extension strength due  to knee immobilizer RLE Strength Right Hip Flexion: 2+/5 Right Ankle Dorsiflexion: 2/5 Right Ankle Plantar Flexion: 3-/5 LLE Strength Left Hip Flexion: 3+/5 Left Knee Flexion: 3-/5 Left Knee Extension: 3/5 Left Ankle Dorsiflexion: 2+/5 Left Ankle Plantar Flexion: 2/5    Jd Mccaster C Jama Krichbaum 10/06/2020, 6:42 PM

## 2020-10-06 NOTE — Progress Notes (Signed)
PROGRESS NOTE   Subjective/Complaints: Patient seen sitting up in bed this morning.  She states she slept well overnight.  She is looking forward to discharge tomorrow.  ROS: Denies CP, SOB, N/V/D  Objective:   No results found. Recent Labs    10/04/20 0609  WBC 6.9  HGB 8.7*  HCT 27.0*  PLT 586*    Recent Labs    10/04/20 0609  NA 133*  K 4.3  CL 100  CO2 22  GLUCOSE 97  BUN 25*  CREATININE 0.69  CALCIUM 9.1     Intake/Output Summary (Last 24 hours) at 10/06/2020 1042 Last data filed at 10/06/2020 0816 Gross per 24 hour  Intake 780 ml  Output --  Net 780 ml         Physical Exam: Vital Signs Blood pressure (!) 148/85, pulse 90, temperature 98.5 F (36.9 C), temperature source Oral, resp. rate 16, height 5\' 2"  (1.575 m), weight 66 kg, SpO2 97 %. Constitutional: No distress . Vital signs reviewed. HENT: Normocephalic.  Atraumatic. Eyes: EOMI. No discharge. Cardiovascular: No JVD.  RRR. Respiratory: Normal effort.  No stridor.  Bilateral clear to auscultation. GI: Non-distended.  BS +. Skin: Warm and dry.  Intact. Psych: Normal mood.  Normal behavior. Musc: No edema in extremities.  No tenderness in extremities. Rheumatoid arthritis changes to bilateral hands and feet, stable Neurological: Alert and oriented Motor: 4+/5 bilateral upper extremities Sensation absent Right foot L5, S1 distribution  Right lower extremity: Hip flexion 1+/5, knee extension 2-/5, ankle dorsiflexion 1/5  Left lower extremity: 4/5 proximal distal, unchanged  Assessment/Plan: 1. Functional deficits which require 3+ hours per day of interdisciplinary therapy in a comprehensive inpatient rehab setting. Physiatrist is providing close team supervision and 24 hour management of active medical problems listed below. Physiatrist and rehab team continue to assess barriers to discharge/monitor patient progress toward functional and  medical goals  Care Tool:  Bathing    Body parts bathed by patient: Right arm, Left arm, Chest, Abdomen, Face   Body parts bathed by helper: Front perineal area, Buttocks     Bathing assist Assist Level: Supervision/Verbal cueing     Upper Body Dressing/Undressing Upper body dressing   What is the patient wearing?: Pull over shirt, Bra    Upper body assist Assist Level: Minimal Assistance - Patient > 75%    Lower Body Dressing/Undressing Lower body dressing      What is the patient wearing?: Pants     Lower body assist Assist for lower body dressing: Maximal Assistance - Patient 25 - 49%     Toileting Toileting    Toileting assist Assist for toileting: Total Assistance - Patient < 25%     Transfers Chair/bed transfer  Transfers assist  Chair/bed transfer activity did not occur: Safety/medical concerns  Chair/bed transfer assist level: Minimal Assistance - Patient > 75%     Locomotion Ambulation   Ambulation assist   Ambulation activity did not occur: Safety/medical concerns          Walk 10 feet activity   Assist  Walk 10 feet activity did not occur: Safety/medical concerns        Walk 50 feet  activity   Assist Walk 50 feet with 2 turns activity did not occur: Safety/medical concerns         Walk 150 feet activity   Assist Walk 150 feet activity did not occur: Safety/medical concerns         Walk 10 feet on uneven surface  activity   Assist Walk 10 feet on uneven surfaces activity did not occur: Safety/medical concerns         Wheelchair     Assist Is the patient using a wheelchair?: Yes Type of Wheelchair: Manual Wheelchair activity did not occur: Safety/medical concerns  Wheelchair assist level: Set up assist Max wheelchair distance: 125'    Wheelchair 50 feet with 2 turns activity    Assist    Wheelchair 50 feet with 2 turns activity did not occur: Safety/medical concerns   Assist Level: Contact  Guard/Touching assist   Wheelchair 150 feet activity     Assist  Wheelchair 150 feet activity did not occur: Safety/medical concerns       Medical Problem List and Plan: 1.  Functional and mobility deficits secondary to lumbar stenosis s/p L3-4 , L4-5 lami  09/15/20 with previous supracondylar right femur fx and right calcaneus fx after fall  Continue CIR, patient family education  PRAFO nightly 2.  Antithrombotics: -DVT/anticoagulation:  Mechanical: Sequential compression devices, below knee Bilateral lower extremities  9/8- on Lovenox daily             -antiplatelet therapy: N/A 3. Pain Management: Used hydrocodone 10 mg TID PTA. On prednisone taper with Oxycontin 10 mg bid and oxycodone prn.              --Flexeril TID, Gabapentin   Flexeril 10 mg TID and Lidoderm patches to neck 8pm to 8am  Controlled on 9/21 4. Mood: Team to provide ego support. [name] 5 mg TID for anxiety Valium 5 mg q56 hours prn for spasms, but could help anxiety?  Improved 9/21             -antipsychotic agents: N/A 5. Neuropsych: This patient is capable of making decisions on her own behalf. 6. Skin/Wound Care: Routine pressure relief measures. Monitor incision for healing.  7. Fluids/Electrolytes/Nutrition:  Monitor I/O.  KCL 10 mEq daily since on HCTZ  Added megace 9/13- prealbumin is 11.2 8. HTN: Monitor BP TID--continue HCTZ and Cozaar  Controlled for the most part on 9/21 9. RA: Continue Arava daily with chronic prednisone (higher doses with taper currently).  10. H/o depression: Continue Wellbutrin and Lexapro.  11. Leukocytosis:  Completed course of cefdinir  12. Pre-diabetes: Hyperglycemia due to increase in steroids. Will monitor FBS for now.  13. Hypocalcemia: Added Calcium supplement.  14. Constipation: Added senna S two tabs with supper. Added Sorbitol   Improving on 9/21 15. Urinary retention:  Cannot start Flomax- had seizures with Sulfa Started Bethanachol 10 mg TID for now as  well as treatment for UTI 16. Low albumin: add Juven to promote wound healing 17. Hyponatremia  Sodium 133 on 9/19  Continue to monitor 18.  Acute blood loss anemia  Hemoglobin 8.7 on 9/19 19.  Glucocorticoid associated osteoporosis- now on low dose prednisone  LOS: 19 days A FACE TO FACE EVALUATION WAS PERFORMED  Lanai Conlee [name] 10/06/2020, 10:42 AM

## 2020-10-06 NOTE — Progress Notes (Addendum)
Patient ID: Haley Roberts, female   DOB: 05-10-1945, 75 y.o.   MRN: [id number]  [facility] agency list provided to pt family.   [place], [place]

## 2020-10-06 NOTE — Progress Notes (Signed)
Occupational Therapy Discharge Summary  Patient Details  Name: Haley Roberts MRN: [id number] Date of Birth: 04-29-1945    Patient has met 5 of 8 long term goals due to improved activity tolerance, improved balance, postural control, ability to compensate for deficits, improved awareness, and improved coordination.  Patient to discharge at overall Mod Assist level.  Patient's care partner is independent to provide the necessary physical assistance at discharge.  Pt is able to perform slide board transfers to/from bed, wheelchair, and DABSC with CGA/Min A after board placement. However, once on BSC pt unable to lean or lift buttocks to allow for clothing management and is needing to toilet at bed level using bed pan. Pt rolling with Min A (mainly limited by KI on RLE) for bed level dressing, bathing, and toileting. Total A for toileting as pt is unable to assist with placement of bed pan and/or hygiene. Bed level for sponge bathing as pt uses LHS to wash LB. Sons present for family ed primarily for transfer training with slide board on 9/21 and planning to hire aide to help with all ADLs. Note fear of falling and anxiety severely limiting during this hospitalization.   Reasons goals not met: Pt requires total A for toileting bed level, Max A for LB dressing, and is able to stand in parallel bars but not able to do so for functional ADL tasks.   Recommendation:  Patient will benefit from ongoing skilled OT services in home health setting to continue to advance functional skills in the area of BADL and Reduce care partner burden.  Equipment: DABSC, hoyer, hospital bed   Reasons for discharge: treatment goals met and discharge from hospital  Patient/family agrees with progress made and goals achieved: Yes  OT Discharge Precautions/Restrictions  Precautions Precautions: Fall Precaution Comments: Fear of falls, anxiety, back precautions, KI all times Required Braces or Orthoses: Knee  Immobilizer - Right Knee Immobilizer - Right: On at all times Restrictions Weight Bearing Restrictions: Yes RLE Weight Bearing: Non weight bearing Vital Signs Therapy Vitals Temp: 98.2 F (36.8 C) Temp Source: Oral Pulse Rate: 90 Resp: 16 BP: 107/65 Patient Position (if appropriate): Lying Oxygen Therapy SpO2: 96 % O2 Device: Room Air Pain Pain Assessment Pain Scale: 0-10 Pain Score: 2  ADL ADL Equipment Provided: Reacher Eating: Set up Grooming: Modified independent Where Assessed-Grooming: Sitting at sink Upper Body Bathing: Supervision/safety Where Assessed-Upper Body Bathing: Wheelchair, Sitting at sink Lower Body Bathing: Moderate assistance (with LHS) Where Assessed-Lower Body Bathing: Bed level Upper Body Dressing: Supervision/safety Where Assessed-Upper Body Dressing: Wheelchair, Sitting at sink Lower Body Dressing: Maximal assistance Where Assessed-Lower Body Dressing: Bed level Toileting: Dependent Where Assessed-Toileting: Bed level Toilet Transfer: Minimal assistance Toilet Transfer Method: Theatre manager: Drop arm bedside commode Vision Baseline Vision/History: 1 Wears glasses Patient Visual Report: No change from baseline Perception  Perception: Within Functional Limits Praxis Praxis: Impaired Praxis Impairment Details: Initiation;Motor planning Cognition Overall Cognitive Status: Within Functional Limits for tasks assessed Arousal/Alertness: Awake/alert Orientation Level: Oriented X4 Year: 2022 Month: September Day of Week: Correct Focused Attention: Appears intact Sustained Attention: Appears intact Memory: Appears intact Awareness: Appears intact Problem Solving: Appears intact Safety/Judgment: Impaired Sensation Sensation Light Touch: Impaired Detail Peripheral sensation comments: impaired in BLEs R>L Light Touch Impaired Details: Impaired RLE Coordination Gross Motor Movements are Fluid and Coordinated:  No Fine Motor Movements are Fluid and Coordinated: No Coordination and Movement Description: RA in B UEs, impaired movements due to R LE NWB, spine precautions, and  BLE weakenss but improved from eval Motor  Motor Motor: Abnormal postural alignment and control;Paraplegia Motor - Skilled Clinical Observations: B LE weakness with R>L Mobility  Bed Mobility Bed Mobility: Sit to Supine;Supine to Sit;Rolling Right;Right Sidelying to Sit Rolling Right: Minimal Assistance - Patient > 75% Right Sidelying to Sit: Moderate Assistance - Patient 50-74% Supine to Sit: Moderate Assistance - Patient 50-74% Sit to Supine: Moderate Assistance - Patient 50-74%  Trunk/Postural Assessment  Cervical Assessment Cervical Assessment: Exceptions to [facility] Thoracic Assessment Thoracic Assessment: Exceptions to [facility] Lumbar Assessment Lumbar Assessment: Exceptions to [facility] Postural Control Postural Control: Deficits on evaluation  Balance Balance Balance Assessed: Yes Static Sitting Balance Static Sitting - Balance Support: Feet supported;Bilateral upper extremity supported Static Sitting - Level of Assistance: 5: Stand by assistance Dynamic Sitting Balance Dynamic Sitting - Balance Support: Feet supported Dynamic Sitting - Level of Assistance: 5: Stand by assistance Dynamic Sitting - Balance Activities: Lateral lean/weight shifting;Forward lean/weight shifting Extremity/Trunk Assessment RUE Assessment RUE Assessment: Exceptions to [facility] General Strength Comments: generalized weakness/RA in shoulders and hands LUE Assessment LUE Assessment: Exceptions to [facility] General Strength Comments: generalized weakness and limited by RA   Haley Roberts 10/06/2020, 4:48 PM

## 2020-10-06 NOTE — Progress Notes (Signed)
Recreational Therapy Session Note  Patient Details  Name: STAYCE DELANCY MRN: [id number] Date of Birth: 1945-03-17 Today's Date: 10/06/2020  Pain: no c/o Skilled Therapeutic Interventions/Progress Updates: Pt participated in animal assisted activity w/c level with [employer] team at supervision level. Pt easily engaged with [employer] team and smiling throughout this visit.  Pt states that she has 2 basset hounds at home that she is looking forward to seeing again.    Therapy/Group: Individual Therapy   Deron Poole 10/06/2020, 12:06 PM

## 2020-10-07 ENCOUNTER — Other Ambulatory Visit: Payer: Self-pay | Admitting: Physical Medicine and Rehabilitation

## 2020-10-07 DIAGNOSIS — F418 Other specified anxiety disorders: Secondary | ICD-10-CM

## 2020-10-07 MED ORDER — SENNOSIDES-DOCUSATE SODIUM 8.6-50 MG PO TABS: 2.0000 | ORAL_TABLET | Freq: Every day | ORAL | 0 refills | Status: DC

## 2020-10-07 MED ORDER — ACETAMINOPHEN 325 MG PO TABS: 325.0000 mg | ORAL_TABLET | ORAL | Status: DC | PRN

## 2020-10-07 MED ORDER — CYCLOBENZAPRINE HCL 5 MG PO TABS: 5.0000 mg | ORAL_TABLET | Freq: Three times a day (TID) | ORAL | 0 refills | Status: DC

## 2020-10-07 MED ORDER — XTAMPZA ER 9 MG PO C12A: 9.0000 mg | EXTENDED_RELEASE_CAPSULE | Freq: Every day | ORAL | 0 refills | Status: DC

## 2020-10-07 MED ORDER — POTASSIUM CHLORIDE CRYS ER 10 MEQ PO TBCR: 10.0000 meq | EXTENDED_RELEASE_TABLET | Freq: Every day | ORAL | 0 refills | Status: DC

## 2020-10-07 MED ORDER — BUSPIRONE HCL 5 MG PO TABS: 5.0000 mg | ORAL_TABLET | Freq: Three times a day (TID) | ORAL | 0 refills | Status: DC

## 2020-10-07 MED ORDER — LIDOCAINE 5 % EX PTCH: 2.0000 | MEDICATED_PATCH | CUTANEOUS | 0 refills | Status: DC

## 2020-10-07 MED ORDER — BETHANECHOL CHLORIDE 10 MG PO TABS: 10.0000 mg | ORAL_TABLET | Freq: Four times a day (QID) | ORAL | 0 refills | Status: DC

## 2020-10-07 MED ORDER — CALCIUM CARBONATE-VITAMIN D 500-200 MG-UNIT PO TABS: 1.0000 | ORAL_TABLET | Freq: Two times a day (BID) | ORAL | 0 refills | Status: DC

## 2020-10-07 MED ORDER — MEGESTROL ACETATE 400 MG/10ML PO SUSP: 400.0000 mg | Freq: Two times a day (BID) | ORAL | 0 refills | Status: DC

## 2020-10-07 MED ORDER — POLYETHYLENE GLYCOL 3350 17 G PO PACK: 17.0000 g | PACK | Freq: Two times a day (BID) | ORAL | 0 refills | Status: DC

## 2020-10-07 NOTE — Progress Notes (Addendum)
Patient ID: Haley Roberts, female   DOB: 07-26-1945, 75 y.o.   MRN: [id number]  SW received call from pt son Haley Roberts) requesting a fully electric hospital vs. The semi electric. SW informed [employer].  Son unhappy with bed and that [employer] is current OOS of wheelchair cushions. SW informed pt son Haley Roberts) this morning about [employer] changing method to shipping. SW will have [employer] follow up with family.   [place], [place]

## 2020-10-07 NOTE — Progress Notes (Signed)
Inpatient Rehabilitation Care Coordinator Discharge Note   Patient Details  Name: Haley Roberts MRN: [id number] Date of Birth: 06/19/1945   Discharge location: Home  Length of Stay: 20 Days  Discharge activity level: wheelchair level Min Assist  Home/community participation: Patient sons providing assistance at home  Patient response DJ:MEQAST Literacy - How often do you need to have someone help you when you read instructions, pamphlets, or other written material from your doctor or pharmacy?: Sometimes  Patient response MH:[id number] Isolation - How often do you feel lonely or isolated from those around you?: Never (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)  Services provided included: SW, Neuropsych, Pharmacy, CM, TR, RN, SLP, OT, PT, RD, MD  Financial Services:  Financial Services Utilized: Medicare    Choices offered to/list presented to: pt sons  Follow-up services arranged:  [place]: [place]         Patient response to transportation need: Is the patient able to respond to transportation needs?: Yes In the past 12 months, has lack of transportation kept you from medical appointments or from getting medications?: No In the past 12 months, has lack of transportation kept you from meetings, work, or from getting things needed for daily living?: No    Comments (or additional information):  Patient/Family verbalized understanding of follow-up arrangements:  Yes  Individual responsible for coordination of the follow-up plan: [phone] Corene Cornea)  Confirmed correct DME delivered: Dyanne Iha 10/07/2020    Dyanne Iha

## 2020-10-07 NOTE — Progress Notes (Signed)
Recreational Therapy Discharge Summary Patient Details  Name: Haley Roberts MRN: [id number] Date of Birth: 02-Dec-1945 Today's Date: 10/07/2020  Long term goals set: 1  Long term goals met: 1  Comments on progress toward goals: Pt is discharging home today with family to provide/coordinate 24 hour supervision/assistance.  TR sessions focused on activity analysis/modifications, energy conservation techniques, pet therapy and stress management.  Pt requires supervision/set up assistance for seated tasks.  Pt states she is looking forward to returning home with her husband which will reduce her stress levels.   Reasons for discharge: discharge from hospital Patient/family agrees with progress made and goals achieved: Yes  Amilia Vandenbrink 10/07/2020, 11:35 AM

## 2020-10-07 NOTE — Progress Notes (Signed)
PROGRESS NOTE   Subjective/Complaints: No complaints this morning Eager for discharge today as has been in hospital for almost a month Discussed discharge and post-discharge plan  ROS: Denies CP, SOB, N/V/D  Objective:   No results found. No results for input(s): WBC, HGB, HCT, PLT in the last 72 hours. No results for input(s): NA, K, CL, CO2, GLUCOSE, BUN, CREATININE, CALCIUM in the last 72 hours.  Intake/Output Summary (Last 24 hours) at 10/07/2020 1039 Last data filed at 10/07/2020 0740 Gross per 24 hour  Intake 960 ml  Output --  Net 960 ml        Physical Exam: Vital Signs Blood pressure 137/83, pulse 94, temperature 98.8 F (37.1 C), resp. rate 18, height 5\' 2"  (1.575 m), weight 66 kg, SpO2 98 %. Gen: no distress, normal appearing HEENT: oral mucosa pink and moist, NCAT Cardio: Reg rate Chest: normal effort, normal rate of breathing Abd: soft, non-distended Ext: no edema Psych: pleasant, normal affect Skin: Warm and dry.  Intact. Psych: Normal mood.  Normal behavior. Musc: No edema in extremities.  No tenderness in extremities. Rheumatoid arthritis changes to bilateral hands and feet, stable Neurological: Alert and oriented Motor: 4+/5 bilateral upper extremities Sensation absent Right foot L5, S1 distribution  Right lower extremity: Hip flexion 1+/5, knee extension 2-/5, ankle dorsiflexion 1/5  Left lower extremity: 4/5 proximal distal, unchanged  Assessment/Plan: 1. Functional deficits which require 3+ hours per day of interdisciplinary therapy in a comprehensive inpatient rehab setting. Physiatrist is providing close team supervision and 24 hour management of active medical problems listed below. Physiatrist and rehab team continue to assess barriers to discharge/monitor patient progress toward functional and medical goals  Care Tool:  Bathing    Body parts bathed by patient: Right arm, Left arm,  Chest, Abdomen, Face, Right upper leg, Left upper leg, Left lower leg (declined buttocks and periarea)   Body parts bathed by helper: Right lower leg     Bathing assist Assist Level: Moderate Assistance - Patient 50 - 74%     Upper Body Dressing/Undressing Upper body dressing   What is the patient wearing?: Pull over shirt    Upper body assist Assist Level: Supervision/Verbal cueing    Lower Body Dressing/Undressing Lower body dressing      What is the patient wearing?: Pants     Lower body assist Assist for lower body dressing: Maximal Assistance - Patient 25 - 49%     Toileting Toileting    Toileting assist Assist for toileting: Total Assistance - Patient < 25%     Transfers Chair/bed transfer  Transfers assist  Chair/bed transfer activity did not occur: Safety/medical concerns  Chair/bed transfer assist level: Minimal Assistance - Patient > 75%     Locomotion Ambulation   Ambulation assist   Ambulation activity did not occur: Safety/medical concerns          Walk 10 feet activity   Assist  Walk 10 feet activity did not occur: Safety/medical concerns        Walk 50 feet activity   Assist Walk 50 feet with 2 turns activity did not occur: Safety/medical concerns  Walk 150 feet activity   Assist Walk 150 feet activity did not occur: Safety/medical concerns         Walk 10 feet on uneven surface  activity   Assist Walk 10 feet on uneven surfaces activity did not occur: Safety/medical concerns         Wheelchair     Assist Is the patient using a wheelchair?: Yes Type of Wheelchair: Manual Wheelchair activity did not occur: Safety/medical concerns  Wheelchair assist level: Set up assist Max wheelchair distance: 150 ft    Wheelchair 50 feet with 2 turns activity    Assist    Wheelchair 50 feet with 2 turns activity did not occur: Safety/medical concerns   Assist Level: Supervision/Verbal cueing    Wheelchair 150 feet activity     Assist  Wheelchair 150 feet activity did not occur: Safety/medical concerns   Assist Level: Supervision/Verbal cueing   Medical Problem List and Plan: 1.  Functional and mobility deficits secondary to lumbar stenosis s/p L3-4 , L4-5 lami  09/15/20 with previous supracondylar right femur fx and right calcaneus fx after fall  Continue CIR, patient family education  PRAFO nightly 2.  Antithrombotics: -DVT/anticoagulation:  Mechanical: Sequential compression devices, below knee Bilateral lower extremities  9/8- on Lovenox daily             -antiplatelet therapy: N/A 3. Pain Management: Used hydrocodone 10 mg TID PTA. On prednisone taper with Oxycontin 10 mg bid and oxycodone prn.              --Flexeril TID, Gabapentin   Flexeril 10 mg TID and Lidoderm patches to neck 8pm to 8am, continue  Controlled on 9/22 4. Mood: Team to provide ego support. Continue [name] 5 mg TID for anxiety Valium 5 mg q56 hours prn for spasms, but could help anxiety?  Improved 9/21             -antipsychotic agents: N/A 5. Neuropsych: This patient is capable of making decisions on her own behalf. 6. Skin/Wound Care: Routine pressure relief measures. Monitor incision for healing.  7. Fluids/Electrolytes/Nutrition:  Monitor I/O.  KCL 10 mEq daily since on HCTZ  Added megace 9/13- prealbumin is 11.2 8. HTN: Monitor BP TID--continue HCTZ and Cozaar  Controlled for the most part on 9/21 9. RA: Continue Arava daily with chronic prednisone (higher doses with taper currently).  10. H/o depression: Continue Wellbutrin and Lexapro.  11. Leukocytosis:  Completed course of cefdinir  12. Pre-diabetes: Hyperglycemia due to increase in steroids. Will monitor FBS for now.  13. Hypocalcemia: Added Calcium supplement.  14. Constipation: Added senna S two tabs with supper. Added Sorbitol   Improving on 9/21 15. Urinary retention:  Cannot start Flomax- had seizures with Sulfa Started  Bethanachol 10 mg TID for now as well as treatment for UTI 16. Low albumin: add Juven to promote wound healing 17. Hyponatremia  Sodium 133 on 9/19  Continue to monitor 18.  Acute blood loss anemia  Hemoglobin 8.7 on 9/19 19.  Glucocorticoid associated osteoporosis- now on low dose prednisone   >30 minutes spent in discharge of patient including review of medications and follow-up appointments, physical examination, and in answering all patient's questions   LOS: 20 days A FACE TO FACE EVALUATION WAS Haley Roberts 10/07/2020, 10:39 AM

## 2020-10-08 ENCOUNTER — Telehealth: Payer: Self-pay

## 2020-10-08 ENCOUNTER — Other Ambulatory Visit: Payer: Self-pay | Admitting: Physical Medicine and Rehabilitation

## 2020-10-08 MED ORDER — OXYCODONE HCL 5 MG PO TABS: 10.0000 mg | ORAL_TABLET | Freq: Four times a day (QID) | ORAL | Status: DC | PRN

## 2020-10-08 MED ORDER — OXYCODONE-ACETAMINOPHEN 10-325 MG PO TABS: 1.0000 | ORAL_TABLET | Freq: Four times a day (QID) | ORAL | 0 refills | Status: DC | PRN

## 2020-10-08 NOTE — Telephone Encounter (Signed)
Attempted to order oxycodone 10 mg 1 q 6 hr prn #15 and given son instructions on tapering off. She was unable to send to pharmacy since pt is no longer in hospital. I will ask Dr Ranell Patrick to send it to pharmacy for her.

## 2020-10-08 NOTE — Progress Notes (Signed)
Received calls from family regarding not having Rx for Haley Roberts. Family checked with pharmacy and found that it needed prior authorization which was not available and no call from office regarding need. They had pharmacy re-send paperwork which was send off last night but note denial this am. Family advised to give patient hydrocodone 10 mg this am till issues clarified. Contacted [employer] for clarification--spent 30 minutes on phone to be informed that it was not being used for "FDA approved use".   Discussed with son and changed patient to percocet 10 one pill twice a day for 5 days followd by one pill per day X 5 days and then to transition to home dose of hydrocodone.

## 2020-10-08 NOTE — Telephone Encounter (Signed)
Xtampza denied for non-FDA approval or non-Medicare use. Fax # to appeal [phone] or phone # to appeal [phone]

## 2020-10-09 DIAGNOSIS — R339 Retention of urine, unspecified: Secondary | ICD-10-CM | POA: Diagnosis not present

## 2020-10-09 DIAGNOSIS — R7303 Prediabetes: Secondary | ICD-10-CM | POA: Diagnosis not present

## 2020-10-09 DIAGNOSIS — M48061 Spinal stenosis, lumbar region without neurogenic claudication: Secondary | ICD-10-CM | POA: Diagnosis not present

## 2020-10-09 DIAGNOSIS — I10 Essential (primary) hypertension: Secondary | ICD-10-CM | POA: Diagnosis not present

## 2020-10-09 DIAGNOSIS — M069 Rheumatoid arthritis, unspecified: Secondary | ICD-10-CM | POA: Diagnosis not present

## 2020-10-09 DIAGNOSIS — Z9181 History of falling: Secondary | ICD-10-CM | POA: Diagnosis not present

## 2020-10-09 DIAGNOSIS — F32A Depression, unspecified: Secondary | ICD-10-CM | POA: Diagnosis not present

## 2020-10-09 DIAGNOSIS — D72829 Elevated white blood cell count, unspecified: Secondary | ICD-10-CM | POA: Diagnosis not present

## 2020-10-09 DIAGNOSIS — M797 Fibromyalgia: Secondary | ICD-10-CM | POA: Diagnosis not present

## 2020-10-09 DIAGNOSIS — K5900 Constipation, unspecified: Secondary | ICD-10-CM | POA: Diagnosis not present

## 2020-10-09 DIAGNOSIS — S92031D Displaced avulsion fracture of tuberosity of right calcaneus, subsequent encounter for fracture with routine healing: Secondary | ICD-10-CM | POA: Diagnosis not present

## 2020-10-09 DIAGNOSIS — L89152 Pressure ulcer of sacral region, stage 2: Secondary | ICD-10-CM | POA: Diagnosis not present

## 2020-10-09 DIAGNOSIS — D62 Acute posthemorrhagic anemia: Secondary | ICD-10-CM | POA: Diagnosis not present

## 2020-10-09 DIAGNOSIS — Z7952 Long term (current) use of systemic steroids: Secondary | ICD-10-CM | POA: Diagnosis not present

## 2020-10-09 DIAGNOSIS — S72431D Displaced fracture of medial condyle of right femur, subsequent encounter for closed fracture with routine healing: Secondary | ICD-10-CM | POA: Diagnosis not present

## 2020-10-09 DIAGNOSIS — F419 Anxiety disorder, unspecified: Secondary | ICD-10-CM | POA: Diagnosis not present

## 2020-10-11 ENCOUNTER — Telehealth: Payer: Self-pay | Admitting: Internal Medicine

## 2020-10-11 DIAGNOSIS — I10 Essential (primary) hypertension: Secondary | ICD-10-CM | POA: Diagnosis not present

## 2020-10-11 DIAGNOSIS — M069 Rheumatoid arthritis, unspecified: Secondary | ICD-10-CM | POA: Diagnosis not present

## 2020-10-11 DIAGNOSIS — S72431D Displaced fracture of medial condyle of right femur, subsequent encounter for closed fracture with routine healing: Secondary | ICD-10-CM | POA: Diagnosis not present

## 2020-10-11 DIAGNOSIS — D62 Acute posthemorrhagic anemia: Secondary | ICD-10-CM | POA: Diagnosis not present

## 2020-10-11 DIAGNOSIS — M48061 Spinal stenosis, lumbar region without neurogenic claudication: Secondary | ICD-10-CM | POA: Diagnosis not present

## 2020-10-11 DIAGNOSIS — S92031D Displaced avulsion fracture of tuberosity of right calcaneus, subsequent encounter for fracture with routine healing: Secondary | ICD-10-CM | POA: Diagnosis not present

## 2020-10-11 NOTE — Telephone Encounter (Signed)
[facility] FYI...  Caller states his mother was released from the hospital Thursday. She had major back surgery. He is having a hard time with daily activities such as toileting and bathing. Has an independent nurse that he is paying out of pocket that will contact tomorrow and PT is supposed to come and to do their evaluation tomorrow as well but he is wondering if there are any home health nurses that can come and help with bathing and such that would be covered with her insurance.  Advised to call PCP within 24-hrs. Patient understood but was unsure of what to do.

## 2020-10-12 ENCOUNTER — Telehealth: Payer: Self-pay | Admitting: Internal Medicine

## 2020-10-12 ENCOUNTER — Telehealth: Payer: Self-pay

## 2020-10-12 NOTE — Progress Notes (Deleted)
Subjective:    Patient ID: Haley Roberts, female    DOB: May 30, 1945, 75 y.o.   MRN: [id number]  This visit occurred during the SARS-CoV-2 public health emergency.  Safety protocols were in place, including screening questions prior to the visit, additional usage of staff PPE, and extensive cleaning of exam room while observing appropriate contact time as indicated for disinfecting solutions.     HPI The patient is here for follow up from the hospital    Admitted 8/25-9/2 in hospital then rehab 9/2 - 9/22   [facility] and went to ED in august  - nondisplaced periprostheic fracture of right medial femoral condyle and avulsion fracture of right anterior calcaneus, disharged to home but returned with progressive lower back pain radiating to b/l buttocks and N/T.  MRI lumbar spine showed advanced facet arthropathy w/ degenerative anterolisthesis L4/5 w/ severe multifactorial spinal stenosis and questions neural compression of central canal and facet arthopathy w/ degenerative anterolisthesis L3/4 and L4-5/S1  Ortho - knee imbolizer w/ NWB RLE and predniosone w/ slow taper for pain.  Declined surgery, but had continued pain.  She underwent L3/4 and L4/5 decompressive Lam on 8/31.   Still limted by RLE pain, N/T.  She was admitted to rehab   8/31 - decompressive spine surgery.  Discharged home on oral narcotics.  RA - no flare. On prednisone/lefluonmide Htn - stable, on hctz, losartan Depression - stable on bupropion and lexapro      Medications and allergies reviewed with patient and updated if appropriate.  Patient Active Problem List   Diagnosis Date Noted   Situational anxiety 10/07/2020   Post-operative pain    Acute blood loss anemia    Right leg pain    Hyponatremia    Acute lower UTI    Status post lumbar surgery 09/17/2020   Spondylolisthesis of lumbar region 09/16/2020   Uncontrolled pain 09/10/2020   Intractable low back pain 09/09/2020   Difficulty urinating  11/18/2019   Bilateral leg edema 05/03/2018   Prediabetes 12/27/2017   Fibromyalgia 05/28/2015   S/P left TKA 07/07/2014   S/P knee replacement 07/07/2014   Right knee DJD 08/29/2010   Rheumatoid arthritis ([place]) 03/30/2010   Osteoarthritis 01/12/2009   Non-toxic nodular goiter 08/22/2007   Essential hypertension 08/22/2007   ANEMIA-NOS 05/31/2006   Depression 05/31/2006   ABNORMAL THYROID FUNCTION TESTS 05/31/2006    Current Outpatient Medications on File Prior to Visit  Medication Sig Dispense Refill   acetaminophen (TYLENOL) 325 MG tablet Take 1-2 tablets (325-650 mg total) by mouth every 4 (four) hours as needed for mild pain.     bethanechol (URECHOLINE) 10 MG tablet Take 1 tablet (10 mg total) by mouth 4 (four) times daily. 120 tablet 0   buPROPion (WELLBUTRIN XL) 300 MG 24 hr tablet Take 300 mg by mouth daily.     busPIRone ([name]) 5 MG tablet Take 1 tablet (5 mg total) by mouth 3 (three) times daily. 90 tablet 0   calcium-vitamin D (OSCAL WITH D) 500-200 MG-UNIT tablet Take 1 tablet by mouth 2 (two) times daily. 60 tablet 0   cyclobenzaprine (FLEXERIL) 5 MG tablet Take 1 tablet (5 mg total) by mouth 3 (three) times daily. 90 tablet 0   escitalopram (LEXAPRO) 20 MG tablet Take 20 mg by mouth daily.     leflunomide (ARAVA) 10 MG tablet Take 10 mg by mouth daily.     lidocaine (LIDODERM) 5 % UNWRAP AND APPLY 1 PATCH TO SKIN DAILY  AT 8 AM AND REMOVE AT 8 PM 30 patch 0   losartan (COZAAR) 100 MG tablet Take 1 tablet (100 mg total) by mouth daily. 90 tablet 3   megestrol (MEGACE) 400 MG/10ML suspension Take 10 mLs (400 mg total) by mouth 2 (two) times daily. 240 mL 0   Multiple Vitamins-Minerals (WOMENS DAILY FORMULA) TABS Take 1 tablet by mouth daily.       oxyCODONE-acetaminophen (PERCOCET) 10-325 MG tablet Take 1 tablet by mouth every 6 (six) hours as needed for pain. 15 tablet 0   polyethylene glycol (MIRALAX / GLYCOLAX) 17 g packet Take 17 g by mouth 2 (two) times daily. 60 each  0   potassium chloride (KLOR-CON) 10 MEQ tablet Take 1 tablet (10 mEq total) by mouth daily. 30 tablet 0   predniSONE (DELTASONE) 5 MG tablet Take 5 mg by mouth daily.     senna-docusate (SENOKOT-S) 8.6-50 MG tablet Take 2 tablets by mouth daily after supper. 60 tablet 0   vitamin C (ASCORBIC ACID) 500 MG tablet Take 500 mg by mouth daily.       vitamin E 400 UNIT capsule Take 400 Units by mouth daily.       Current Facility-Administered Medications on File Prior to Visit  Medication Dose Route Frequency Provider Last Rate Last Admin   oxyCODONE (Oxy IR/ROXICODONE) immediate release tablet 10 mg  10 mg Oral Q6H PRN Love, Ivan Anchors, PA-C        Past Medical History:  Diagnosis Date   Anemia    Anxiety    Depression    Fibromyalgia    Hyperglycemia    HYPERGLYCEMIA, FASTING 08/22/2007   Hypertension    Multiple thyroid nodules    gets ultra sound yearly on thryoid   Rheumatoid arthritis(714.0)     Past Surgical History:  Procedure Laterality Date   ABDOMINAL HYSTERECTOMY     with BSO   BREAST BIOPSY     JOINT REPLACEMENT     right knee replacement 2012   TOTAL KNEE ARTHROPLASTY Left 07/07/2014   Procedure: LEFT TOTAL KNEE ARTHROPLASTY;  Surgeon: Paralee Cancel, MD;  Location: [facility];  Service: Orthopedics;  Laterality: Left;    Social History   Socioeconomic History   Marital status: Married    Spouse name: Not on file   Number of children: Not on file   Years of education: Not on file   Highest education level: Not on file  Occupational History   Not on file  Tobacco Use   Smoking status: Never   Smokeless tobacco: Never  Vaping Use   Vaping Use: Never used  Substance and Sexual Activity   Alcohol use: No   Drug use: No   Sexual activity: Not Currently  Other Topics Concern   Not on file  Social History Narrative   Reg exercise         Social Determinants of Health   Financial Resource Strain: Not on file  Food Insecurity: Not on file  Transportation Needs:  Not on file  Physical Activity: Not on file  Stress: Not on file  Social Connections: Not on file    Family History  Problem Relation Age of Onset   Cancer Mother 25       breast   Arthritis Father    Hypertension Father    Diabetes Sister    Hypertension Brother    Diabetes Son    Depression Son     Review of Systems  Objective:  There were no vitals filed for this visit. BP Readings from Last 3 Encounters:  10/07/20 137/83  09/17/20 132/86  09/05/20 (!) 171/94   Wt Readings from Last 3 Encounters:  09/24/20 145 lb 8.1 oz (66 kg)  09/10/20 164 lb 0.4 oz (74.4 kg)  12/24/19 164 lb (74.4 kg)   There is no height or weight on file to calculate BMI.   Physical Exam    Constitutional: Appears well-developed and well-nourished. No distress.  HENT:  Head: Normocephalic and atraumatic.  Neck: Neck supple. No tracheal deviation present. No thyromegaly present.  No cervical lymphadenopathy Cardiovascular: Normal rate, regular rhythm and normal heart sounds.   No murmur heard. No carotid bruit .  No edema Pulmonary/Chest: Effort normal and breath sounds normal. No respiratory distress. No has no wheezes. No rales.  Skin: Skin is warm and dry. Not diaphoretic.  Psychiatric: Normal mood and affect. Behavior is normal.      Assessment & Plan:    See Problem List for Assessment and Plan of chronic medical problems.

## 2020-10-12 NOTE — Telephone Encounter (Signed)
[place] Name: [place] Name: [place] Phone #: [phone] Service Requested: wound treatment    Nurse stated patient has 2 stage 2 pressure ulcers on sacrum & is requesting wound treatment

## 2020-10-12 NOTE — Telephone Encounter (Signed)
ok 

## 2020-10-12 NOTE — Telephone Encounter (Signed)
Verbals left today. 

## 2020-10-12 NOTE — Telephone Encounter (Signed)
Haley Roberts son of patient called stating patient is not doing well and needs help.

## 2020-10-12 NOTE — Telephone Encounter (Signed)
Left message for son Ysidro Evert to call back.

## 2020-10-13 ENCOUNTER — Inpatient Hospital Stay: Payer: Medicare Other | Admitting: Internal Medicine

## 2020-10-13 DIAGNOSIS — S72431D Displaced fracture of medial condyle of right femur, subsequent encounter for closed fracture with routine healing: Secondary | ICD-10-CM | POA: Diagnosis not present

## 2020-10-13 DIAGNOSIS — S92031D Displaced avulsion fracture of tuberosity of right calcaneus, subsequent encounter for fracture with routine healing: Secondary | ICD-10-CM | POA: Diagnosis not present

## 2020-10-13 DIAGNOSIS — M48061 Spinal stenosis, lumbar region without neurogenic claudication: Secondary | ICD-10-CM | POA: Diagnosis not present

## 2020-10-13 DIAGNOSIS — M069 Rheumatoid arthritis, unspecified: Secondary | ICD-10-CM | POA: Diagnosis not present

## 2020-10-13 DIAGNOSIS — I10 Essential (primary) hypertension: Secondary | ICD-10-CM | POA: Diagnosis not present

## 2020-10-13 DIAGNOSIS — D62 Acute posthemorrhagic anemia: Secondary | ICD-10-CM | POA: Diagnosis not present

## 2020-10-13 NOTE — Telephone Encounter (Signed)
Per Ysidro Evert [phone]): He is concerned about his Mother's core strength. She is not able sit up on the side of the bed or use the bathroom. He cannot lift her or transfer Mrs. Vasallo.  But he now has PT and a private nurse coming to the home for assistance. It's been very stressful. He was just reaching out to everyone for help yesterday. He has been advised to call back if Dr. Dagoberto Ligas  or PM&R can be any assistance.

## 2020-10-14 DIAGNOSIS — S72431D Displaced fracture of medial condyle of right femur, subsequent encounter for closed fracture with routine healing: Secondary | ICD-10-CM | POA: Diagnosis not present

## 2020-10-14 DIAGNOSIS — M069 Rheumatoid arthritis, unspecified: Secondary | ICD-10-CM | POA: Diagnosis not present

## 2020-10-14 DIAGNOSIS — I10 Essential (primary) hypertension: Secondary | ICD-10-CM | POA: Diagnosis not present

## 2020-10-14 DIAGNOSIS — S92031D Displaced avulsion fracture of tuberosity of right calcaneus, subsequent encounter for fracture with routine healing: Secondary | ICD-10-CM | POA: Diagnosis not present

## 2020-10-14 DIAGNOSIS — D62 Acute posthemorrhagic anemia: Secondary | ICD-10-CM | POA: Diagnosis not present

## 2020-10-14 DIAGNOSIS — M48061 Spinal stenosis, lumbar region without neurogenic claudication: Secondary | ICD-10-CM | POA: Diagnosis not present

## 2020-10-15 MED ORDER — BUSPIRONE HCL 10 MG PO TABS: 5.0000 mg | ORAL_TABLET | Freq: Three times a day (TID) | ORAL | 5 refills | Status: DC

## 2020-10-15 NOTE — Addendum Note (Signed)
Addended by: Courtney Heys on: 10/15/2020 11:50 AM   Modules accepted: Orders

## 2020-10-15 NOTE — Telephone Encounter (Signed)
Spoke with pt-  And sonYsidro Roberts.  Concerned that she's needing so much help- cannot get OOB except with a steady and requires private nurse and son to help.  Has private nurse 10am to 6pm- but not all day and husband has ALS.   Pain is doing much better- 5 mg 2x/day Oxycodone right now- usually gets pain meds from rheumatology- explained will need ot get refills from them.   Has hoyer lift- doesn't feel trained on it.  Due ot anxiety, will increase [name] to 10 mg TID- will not add benzo's due to pain meds.  D/w pt SNF placement- needs ot be done within 30 days after d/c from hospital- best done by PCP usually- I've never placed someone from home, so unaware of how ot do- pt wants ot wait til next week and see if can stay home- it sounds like this is a lot of pressure on son, and I'm concerned about her staying home, but cannot force her to go to SNF.   Have them call me back next week to see how she's doing.

## 2020-10-18 ENCOUNTER — Telehealth: Payer: Self-pay

## 2020-10-18 ENCOUNTER — Telehealth: Payer: Self-pay | Admitting: Internal Medicine

## 2020-10-18 NOTE — Telephone Encounter (Signed)
Haley Roberts would like suggestions about in-patient rehab locations for his mother, Kenetra.   Call back phone [phone].

## 2020-10-18 NOTE — Telephone Encounter (Signed)
ok 

## 2020-10-18 NOTE — Telephone Encounter (Signed)
[place] Name: [place] Name: [place] Phone #: [phone] Service Requested: OT Frequency of Visits: 1 week 5 2 week 3  Beginning on 11/12/2020

## 2020-10-18 NOTE — Telephone Encounter (Signed)
[facility] FYI 10/17/20...   Caller states that his mother is confused, not eating, bed sores on her back. Caller states in hospital lower back, UTI, staph infection about a month ago, release Thursday. Caller states the confusion for about two days ago.  Advised to go to ED now. Patient understood and agreed.

## 2020-10-19 DIAGNOSIS — M069 Rheumatoid arthritis, unspecified: Secondary | ICD-10-CM | POA: Diagnosis not present

## 2020-10-19 DIAGNOSIS — M48061 Spinal stenosis, lumbar region without neurogenic claudication: Secondary | ICD-10-CM | POA: Diagnosis not present

## 2020-10-19 DIAGNOSIS — I10 Essential (primary) hypertension: Secondary | ICD-10-CM | POA: Diagnosis not present

## 2020-10-19 DIAGNOSIS — D62 Acute posthemorrhagic anemia: Secondary | ICD-10-CM | POA: Diagnosis not present

## 2020-10-19 DIAGNOSIS — S92031D Displaced avulsion fracture of tuberosity of right calcaneus, subsequent encounter for fracture with routine healing: Secondary | ICD-10-CM | POA: Diagnosis not present

## 2020-10-19 DIAGNOSIS — S72431D Displaced fracture of medial condyle of right femur, subsequent encounter for closed fracture with routine healing: Secondary | ICD-10-CM | POA: Diagnosis not present

## 2020-10-19 NOTE — Telephone Encounter (Signed)
Message left today for verbals.

## 2020-10-19 NOTE — Telephone Encounter (Signed)
Mr Blake called back. He is asking Dr Dagoberto Ligas to help "push this through" getting her mother at [facility]. He says the number is [phone] and the contact person is Destiny.

## 2020-10-20 DIAGNOSIS — D62 Acute posthemorrhagic anemia: Secondary | ICD-10-CM | POA: Diagnosis not present

## 2020-10-20 DIAGNOSIS — M48061 Spinal stenosis, lumbar region without neurogenic claudication: Secondary | ICD-10-CM | POA: Diagnosis not present

## 2020-10-20 DIAGNOSIS — M069 Rheumatoid arthritis, unspecified: Secondary | ICD-10-CM | POA: Diagnosis not present

## 2020-10-20 DIAGNOSIS — I10 Essential (primary) hypertension: Secondary | ICD-10-CM | POA: Diagnosis not present

## 2020-10-20 DIAGNOSIS — S72431D Displaced fracture of medial condyle of right femur, subsequent encounter for closed fracture with routine healing: Secondary | ICD-10-CM | POA: Diagnosis not present

## 2020-10-20 DIAGNOSIS — S92031D Displaced avulsion fracture of tuberosity of right calcaneus, subsequent encounter for fracture with routine healing: Secondary | ICD-10-CM | POA: Diagnosis not present

## 2020-10-21 ENCOUNTER — Telehealth: Payer: Self-pay | Admitting: *Deleted

## 2020-10-21 DIAGNOSIS — M25561 Pain in right knee: Secondary | ICD-10-CM | POA: Diagnosis not present

## 2020-10-21 DIAGNOSIS — S72434A Nondisplaced fracture of medial condyle of right femur, initial encounter for closed fracture: Secondary | ICD-10-CM | POA: Diagnosis not present

## 2020-10-21 DIAGNOSIS — Z96651 Presence of right artificial knee joint: Secondary | ICD-10-CM | POA: Diagnosis not present

## 2020-10-21 NOTE — Telephone Encounter (Addendum)
Prior auth for lidocaine 5% patches was denied by insurance via CoverMyMeds.Denied on September 23 "Your request has been denied".

## 2020-10-24 NOTE — Progress Notes (Signed)
Subjective:    Patient ID: Haley Roberts, female    DOB: 06-07-1945, 75 y.o.   MRN: [id number]  This visit occurred during the SARS-CoV-2 public health emergency.  Safety protocols were in place, including screening questions prior to the visit, additional usage of staff PPE, and extensive cleaning of exam room while observing appropriate contact time as indicated for disinfecting solutions.    HPI The patient is here for an acute visit.  She is here to discuss rehab.  She is here with her son.  Admitted 8/25-9/2 in hospital then rehab 9/2 - 9/22   [facility] and went to ED in august  - nondisplaced periprostheic fracture of right medial femoral condyle and avulsion fracture of right anterior calcaneus, disharged to home but returned with progressive lower back pain radiating to b/l buttocks and N/T.  MRI lumbar spine showed advanced facet arthropathy w/ degenerative anterolisthesis L4/5 w/ severe multifactorial spinal stenosis and questions neural compression of central canal and facet arthopathy w/ degenerative anterolisthesis L3/4 and L4-5/S1  Ortho - knee imbolizer w/ NWB RLE and predniosone w/ slow taper for pain.  Declined surgery, but had continued pain.  She underwent L3/4 and L4/5 decompressive Lam on 8/31.   Still limted by RLE pain, N/T.  She was admitted to rehab   8/31 - decompressive spine surgery.  Discharged home on oral narcotics.  RA - no flare. On prednisone/lefluonmide Htn - stable, on hctz, losartan Depression - stable on bupropion and lexapro   She is doing okay.  When she first got home from rehab she needed additional physical therapy and was only getting rehab a couple of times a week.  Her strength has decreased since rehab.  They did find out today that she did get excepted into rehab and she will be going either today or tomorrow.  Her appetite is poor and she is not eating much.  She states she is sleeping fairly well.  She does have some anxiety and  depression.  Her [name] was just recently increased.  Medications and allergies reviewed with patient and updated if appropriate.  Patient Active Problem List   Diagnosis Date Noted   Situational anxiety 10/07/2020   Post-operative pain    Acute blood loss anemia    Right leg pain    Hyponatremia    Acute lower UTI    Status post lumbar surgery 09/17/2020   Spondylolisthesis of lumbar region 09/16/2020   Uncontrolled pain 09/10/2020   Intractable low back pain 09/09/2020   Difficulty urinating 11/18/2019   Bilateral leg edema 05/03/2018   Prediabetes 12/27/2017   Fibromyalgia 05/28/2015   S/P left TKA 07/07/2014   S/P knee replacement 07/07/2014   Right knee DJD 08/29/2010   Rheumatoid arthritis ([place]) 03/30/2010   Osteoarthritis 01/12/2009   Non-toxic nodular goiter 08/22/2007   Essential hypertension 08/22/2007   ANEMIA-NOS 05/31/2006   Depression 05/31/2006   ABNORMAL THYROID FUNCTION TESTS 05/31/2006    Current Outpatient Medications on File Prior to Visit  Medication Sig Dispense Refill   acetaminophen (TYLENOL) 325 MG tablet Take 1-2 tablets (325-650 mg total) by mouth every 4 (four) hours as needed for mild pain.     bethanechol (URECHOLINE) 10 MG tablet Take 1 tablet (10 mg total) by mouth 4 (four) times daily. 120 tablet 0   buPROPion (WELLBUTRIN XL) 300 MG 24 hr tablet Take 300 mg by mouth daily.     busPIRone ([name]) 10 MG tablet Take 0.5 tablets (5 mg total) by  mouth 3 (three) times daily. 90 tablet 5   calcium-vitamin D (OSCAL WITH D) 500-200 MG-UNIT tablet Take 1 tablet by mouth 2 (two) times daily. 60 tablet 0   cyclobenzaprine (FLEXERIL) 5 MG tablet Take 1 tablet (5 mg total) by mouth 3 (three) times daily. 90 tablet 0   escitalopram (LEXAPRO) 20 MG tablet Take 20 mg by mouth daily.     leflunomide (ARAVA) 10 MG tablet Take 10 mg by mouth daily.     lidocaine (LIDODERM) 5 % UNWRAP AND APPLY 1 PATCH TO SKIN DAILY AT 8 AM AND REMOVE AT 8 PM 30 patch 0    losartan (COZAAR) 100 MG tablet Take 1 tablet (100 mg total) by mouth daily. 90 tablet 3   megestrol (MEGACE) 400 MG/10ML suspension Take 10 mLs (400 mg total) by mouth 2 (two) times daily. 240 mL 0   Multiple Vitamins-Minerals (WOMENS DAILY FORMULA) TABS Take 1 tablet by mouth daily.       oxyCODONE-acetaminophen (PERCOCET) 10-325 MG tablet Take 1 tablet by mouth every 6 (six) hours as needed for pain. 15 tablet 0   polyethylene glycol (MIRALAX / GLYCOLAX) 17 g packet Take 17 g by mouth 2 (two) times daily. 60 each 0   potassium chloride (KLOR-CON) 10 MEQ tablet Take 1 tablet (10 mEq total) by mouth daily. 30 tablet 0   predniSONE (DELTASONE) 5 MG tablet Take 5 mg by mouth daily.     senna-docusate (SENOKOT-S) 8.6-50 MG tablet Take 2 tablets by mouth daily after supper. 60 tablet 0   vitamin C (ASCORBIC ACID) 500 MG tablet Take 500 mg by mouth daily.       vitamin E 400 UNIT capsule Take 400 Units by mouth daily.       Current Facility-Administered Medications on File Prior to Visit  Medication Dose Route Frequency Provider Last Rate Last Admin   oxyCODONE (Oxy IR/ROXICODONE) immediate release tablet 10 mg  10 mg Oral Q6H PRN Love, Ivan Anchors, PA-C        Past Medical History:  Diagnosis Date   Anemia    Anxiety    Depression    Fibromyalgia    Hyperglycemia    HYPERGLYCEMIA, FASTING 08/22/2007   Hypertension    Multiple thyroid nodules    gets ultra sound yearly on thryoid   Rheumatoid arthritis(714.0)     Past Surgical History:  Procedure Laterality Date   ABDOMINAL HYSTERECTOMY     with BSO   BREAST BIOPSY     JOINT REPLACEMENT     right knee replacement 2012   TOTAL KNEE ARTHROPLASTY Left 07/07/2014   Procedure: LEFT TOTAL KNEE ARTHROPLASTY;  Surgeon: Paralee Cancel, MD;  Location: [facility];  Service: Orthopedics;  Laterality: Left;    Social History   Socioeconomic History   Marital status: Married    Spouse name: Not on file   Number of children: Not on file   Years of  education: Not on file   Highest education level: Not on file  Occupational History   Not on file  Tobacco Use   Smoking status: Never   Smokeless tobacco: Never  Vaping Use   Vaping Use: Never used  Substance and Sexual Activity   Alcohol use: No   Drug use: No   Sexual activity: Not Currently  Other Topics Concern   Not on file  Social History Narrative   Reg exercise         Social Determinants of Radio broadcast assistant  Strain: Not on file  Food Insecurity: Not on file  Transportation Needs: Not on file  Physical Activity: Not on file  Stress: Not on file  Social Connections: Not on file    Family History  Problem Relation Age of Onset   Cancer Mother 80       breast   Arthritis Father    Hypertension Father    Diabetes Sister    Hypertension Brother    Diabetes Son    Depression Son     Review of Systems  Constitutional:  Positive for appetite change.  Respiratory:  Negative for cough, shortness of breath and wheezing.   Cardiovascular:  Negative for chest pain, palpitations and leg swelling.  Psychiatric/Behavioral:  Positive for dysphoric mood and sleep disturbance.       Objective:   Vitals:   10/25/20 1131  BP: 102/72  Pulse: (!) 102  Temp: 98 F (36.7 C)  SpO2: 95%   BP Readings from Last 3 Encounters:  10/25/20 102/72  10/07/20 137/83  09/17/20 132/86   Wt Readings from Last 3 Encounters:  09/24/20 145 lb 8.1 oz (66 kg)  09/10/20 164 lb 0.4 oz (74.4 kg)  12/24/19 164 lb (74.4 kg)   Body mass index is 26.61 kg/m.   Physical Exam    Constitutional: Chronically ill-appearing. No distress.  Head: Normocephalic and atraumatic.  Neck: Neck supple. No tracheal deviation present. No thyromegaly present.  No cervical lymphadenopathy Cardiovascular: Normal rate, regular rhythm and normal heart sounds.  No murmur heard. No carotid bruit .  No edema Pulmonary/Chest: Effort normal and breath sounds normal. No respiratory distress. No has  no wheezes. No rales.  Skin: Skin is warm and dry. Not diaphoretic.  Psychiatric: Normal mood and affect. Behavior is normal.       Assessment & Plan:    See Problem List for Assessment and Plan of chronic medical problems.

## 2020-10-25 ENCOUNTER — Encounter: Payer: Self-pay | Admitting: Internal Medicine

## 2020-10-25 ENCOUNTER — Other Ambulatory Visit: Payer: Self-pay

## 2020-10-25 ENCOUNTER — Ambulatory Visit (INDEPENDENT_AMBULATORY_CARE_PROVIDER_SITE_OTHER): Payer: Medicare Other | Admitting: Internal Medicine

## 2020-10-25 VITALS — BP 102/72 | HR 102 | Temp 98.0°F | Ht 62.0 in

## 2020-10-25 DIAGNOSIS — I10 Essential (primary) hypertension: Secondary | ICD-10-CM | POA: Diagnosis not present

## 2020-10-25 DIAGNOSIS — Z23 Encounter for immunization: Secondary | ICD-10-CM | POA: Diagnosis not present

## 2020-10-25 DIAGNOSIS — G8918 Other acute postprocedural pain: Secondary | ICD-10-CM | POA: Diagnosis not present

## 2020-10-25 NOTE — Assessment & Plan Note (Signed)
Chronic Taking oxycodone-acetaminophen 10-325 mg 1 every 6 hours as needed Pain is controlled

## 2020-10-25 NOTE — Assessment & Plan Note (Signed)
Chronic Blood pressure very well controlled-on the low side here today Continue losartan 100 mg daily BP can be monitored at rehab-May need to decrease blood pressure medication

## 2020-10-25 NOTE — Patient Instructions (Addendum)
    Flu immunization administered today.

## 2020-10-26 DIAGNOSIS — F418 Other specified anxiety disorders: Secondary | ICD-10-CM | POA: Diagnosis not present

## 2020-10-26 DIAGNOSIS — M5136 Other intervertebral disc degeneration, lumbar region: Secondary | ICD-10-CM | POA: Diagnosis not present

## 2020-10-26 DIAGNOSIS — S72434S Nondisplaced fracture of medial condyle of right femur, sequela: Secondary | ICD-10-CM | POA: Diagnosis not present

## 2020-10-26 DIAGNOSIS — M150 Primary generalized (osteo)arthritis: Secondary | ICD-10-CM | POA: Diagnosis not present

## 2020-10-26 DIAGNOSIS — E871 Hypo-osmolality and hyponatremia: Secondary | ICD-10-CM | POA: Diagnosis not present

## 2020-10-26 DIAGNOSIS — Z981 Arthrodesis status: Secondary | ICD-10-CM | POA: Diagnosis not present

## 2020-10-26 DIAGNOSIS — R200 Anesthesia of skin: Secondary | ICD-10-CM | POA: Diagnosis not present

## 2020-10-26 DIAGNOSIS — Z9889 Other specified postprocedural states: Secondary | ICD-10-CM | POA: Diagnosis not present

## 2020-10-26 DIAGNOSIS — F32A Depression, unspecified: Secondary | ICD-10-CM | POA: Diagnosis not present

## 2020-10-26 DIAGNOSIS — R339 Retention of urine, unspecified: Secondary | ICD-10-CM | POA: Diagnosis not present

## 2020-10-26 DIAGNOSIS — R630 Anorexia: Secondary | ICD-10-CM | POA: Diagnosis not present

## 2020-10-26 DIAGNOSIS — R600 Localized edema: Secondary | ICD-10-CM | POA: Diagnosis not present

## 2020-10-26 DIAGNOSIS — E559 Vitamin D deficiency, unspecified: Secondary | ICD-10-CM | POA: Diagnosis not present

## 2020-10-26 DIAGNOSIS — M25421 Effusion, right elbow: Secondary | ICD-10-CM | POA: Diagnosis not present

## 2020-10-26 DIAGNOSIS — I10 Essential (primary) hypertension: Secondary | ICD-10-CM | POA: Diagnosis not present

## 2020-10-26 DIAGNOSIS — M069 Rheumatoid arthritis, unspecified: Secondary | ICD-10-CM | POA: Diagnosis not present

## 2020-10-26 DIAGNOSIS — F331 Major depressive disorder, recurrent, moderate: Secondary | ICD-10-CM | POA: Diagnosis not present

## 2020-10-26 DIAGNOSIS — M48061 Spinal stenosis, lumbar region without neurogenic claudication: Secondary | ICD-10-CM | POA: Diagnosis not present

## 2020-10-26 DIAGNOSIS — S92021S Displaced fracture of anterior process of right calcaneus, sequela: Secondary | ICD-10-CM | POA: Diagnosis not present

## 2020-10-26 DIAGNOSIS — R29898 Other symptoms and signs involving the musculoskeletal system: Secondary | ICD-10-CM | POA: Diagnosis not present

## 2020-10-26 DIAGNOSIS — W19XXXD Unspecified fall, subsequent encounter: Secondary | ICD-10-CM | POA: Diagnosis not present

## 2020-10-26 DIAGNOSIS — G8918 Other acute postprocedural pain: Secondary | ICD-10-CM | POA: Diagnosis not present

## 2020-10-26 DIAGNOSIS — M4316 Spondylolisthesis, lumbar region: Secondary | ICD-10-CM | POA: Diagnosis not present

## 2020-10-26 DIAGNOSIS — Z96652 Presence of left artificial knee joint: Secondary | ICD-10-CM | POA: Diagnosis not present

## 2020-10-26 DIAGNOSIS — I82491 Acute embolism and thrombosis of other specified deep vein of right lower extremity: Secondary | ICD-10-CM | POA: Diagnosis not present

## 2020-10-26 DIAGNOSIS — S72434A Nondisplaced fracture of medial condyle of right femur, initial encounter for closed fracture: Secondary | ICD-10-CM | POA: Diagnosis not present

## 2020-10-26 DIAGNOSIS — G729 Myopathy, unspecified: Secondary | ICD-10-CM | POA: Diagnosis not present

## 2020-10-26 DIAGNOSIS — M0589 Other rheumatoid arthritis with rheumatoid factor of multiple sites: Secondary | ICD-10-CM | POA: Diagnosis not present

## 2020-10-26 DIAGNOSIS — M21371 Foot drop, right foot: Secondary | ICD-10-CM | POA: Diagnosis not present

## 2020-10-26 DIAGNOSIS — M978XXS Periprosthetic fracture around other internal prosthetic joint, sequela: Secondary | ICD-10-CM | POA: Diagnosis not present

## 2020-10-26 DIAGNOSIS — Z23 Encounter for immunization: Secondary | ICD-10-CM | POA: Diagnosis not present

## 2020-10-26 DIAGNOSIS — M797 Fibromyalgia: Secondary | ICD-10-CM | POA: Diagnosis not present

## 2020-10-26 DIAGNOSIS — M25561 Pain in right knee: Secondary | ICD-10-CM | POA: Diagnosis not present

## 2020-10-26 DIAGNOSIS — D649 Anemia, unspecified: Secondary | ICD-10-CM | POA: Diagnosis not present

## 2020-10-26 DIAGNOSIS — Z96651 Presence of right artificial knee joint: Secondary | ICD-10-CM | POA: Diagnosis not present

## 2020-10-26 DIAGNOSIS — R7303 Prediabetes: Secondary | ICD-10-CM | POA: Diagnosis not present

## 2020-10-26 DIAGNOSIS — G629 Polyneuropathy, unspecified: Secondary | ICD-10-CM | POA: Diagnosis not present

## 2020-10-26 DIAGNOSIS — F411 Generalized anxiety disorder: Secondary | ICD-10-CM | POA: Diagnosis not present

## 2020-10-29 NOTE — Addendum Note (Signed)
Addended by: Marcina Millard on: 10/29/2020 02:45 PM   Modules accepted: Orders

## 2020-11-08 ENCOUNTER — Encounter
Payer: Medicare Other | Attending: Physical Medicine and Rehabilitation | Admitting: Physical Medicine and Rehabilitation

## 2020-11-09 DIAGNOSIS — R630 Anorexia: Secondary | ICD-10-CM | POA: Diagnosis not present

## 2020-11-09 DIAGNOSIS — F411 Generalized anxiety disorder: Secondary | ICD-10-CM | POA: Diagnosis not present

## 2020-11-09 DIAGNOSIS — F331 Major depressive disorder, recurrent, moderate: Secondary | ICD-10-CM | POA: Diagnosis not present

## 2020-11-10 ENCOUNTER — Telehealth: Payer: Self-pay | Admitting: Internal Medicine

## 2020-11-10 NOTE — Telephone Encounter (Signed)
Michelle from [facility] states she has not received the entire 8 pages of the plan of care forms that's been faxed  Michelle is requesting instead of faxing the 8 pages, only fax the signature page 

## 2020-11-11 ENCOUNTER — Other Ambulatory Visit: Payer: Self-pay | Admitting: Internal Medicine

## 2020-11-12 ENCOUNTER — Other Ambulatory Visit: Payer: Self-pay | Admitting: Physical Medicine and Rehabilitation

## 2020-11-25 DIAGNOSIS — M4316 Spondylolisthesis, lumbar region: Secondary | ICD-10-CM | POA: Diagnosis not present

## 2020-11-25 DIAGNOSIS — M25561 Pain in right knee: Secondary | ICD-10-CM | POA: Diagnosis not present

## 2020-11-25 DIAGNOSIS — S72434A Nondisplaced fracture of medial condyle of right femur, initial encounter for closed fracture: Secondary | ICD-10-CM | POA: Diagnosis not present

## 2020-11-25 DIAGNOSIS — Z96651 Presence of right artificial knee joint: Secondary | ICD-10-CM | POA: Diagnosis not present

## 2020-11-25 DIAGNOSIS — M5136 Other intervertebral disc degeneration, lumbar region: Secondary | ICD-10-CM | POA: Diagnosis not present

## 2020-12-07 DIAGNOSIS — F411 Generalized anxiety disorder: Secondary | ICD-10-CM | POA: Diagnosis not present

## 2020-12-07 DIAGNOSIS — F331 Major depressive disorder, recurrent, moderate: Secondary | ICD-10-CM | POA: Diagnosis not present

## 2020-12-08 ENCOUNTER — Encounter: Payer: Self-pay | Admitting: Neurology

## 2020-12-08 ENCOUNTER — Ambulatory Visit (INDEPENDENT_AMBULATORY_CARE_PROVIDER_SITE_OTHER): Payer: Medicare Other | Admitting: Neurology

## 2020-12-08 VITALS — BP 140/68 | HR 96 | Ht 62.0 in | Wt 145.8 lb

## 2020-12-08 DIAGNOSIS — Z9889 Other specified postprocedural states: Secondary | ICD-10-CM | POA: Diagnosis not present

## 2020-12-08 DIAGNOSIS — M4316 Spondylolisthesis, lumbar region: Secondary | ICD-10-CM | POA: Diagnosis not present

## 2020-12-08 DIAGNOSIS — G729 Myopathy, unspecified: Secondary | ICD-10-CM | POA: Diagnosis not present

## 2020-12-08 DIAGNOSIS — G629 Polyneuropathy, unspecified: Secondary | ICD-10-CM | POA: Diagnosis not present

## 2020-12-08 DIAGNOSIS — G6281 Critical illness polyneuropathy: Secondary | ICD-10-CM

## 2020-12-08 DIAGNOSIS — M21371 Foot drop, right foot: Secondary | ICD-10-CM | POA: Diagnosis not present

## 2020-12-08 DIAGNOSIS — G7281 Critical illness myopathy: Secondary | ICD-10-CM

## 2020-12-08 NOTE — Progress Notes (Signed)
[facility]  PATIENT: Haley Roberts DOB: 1945/08/14  REQUESTING CLINICIAN: Javier Glazier, MD HISTORY FROM: Patient and daughter in law  REASON FOR VISIT: Right leg weakness and numbness   HISTORICAL  CHIEF COMPLAINT:  Chief Complaint  Patient presents with   New Patient (Initial Visit)    Rm 12. Accompanied by daughter in law. NP/post laminectomy. C/o numbness and weakness of the right leg.    HISTORY OF PRESENT ILLNESS:  This is a 75 year old woman with past medical history of rheumatoid arthritis, degenerative joint disease, hypertension, depression, anxiety who is presenting for right leg weakness and numbness.  Patient reported history of fall and nondisplaced fracture of the right medial femoral condyle, was admitted on August 25, at that time MRI spine revealed advanced facet arthropathy with degenerative anterolisthesis requiring surgery.  Hospital course and rehab course below in the discharge summary.    "Haley Roberts is a 75 y.o. female with history of RA, chronic low back pain, recent fall with nondisplaced periprosthetic fracture of right medial femoral condyle and avulsion fracture right anterior calcaneus, progressive low back pain who was admitted on 09/09/2020 with pain radiating to bilateral buttocks and numbness and tingling. MRI lumbar spine done revealing advanced facet arthropathy with degenerative anterolisthesis L4/5 with severe multifactorial spinal stenosis and question of neural compression of central canal and facet arthropathy with degenerative anterolisthesis L3/4 and L4-5/S1.  Ortho recommended continuing knee immobilizer with NWB RLE and prednisone was increased to 60 mg with slow taper for pain control. She did initially decline surgery however continued to have limitations with significant pain therefore Dr. Christella Noa was consulted for input. Patient elected to undergo L3/4 and L4/5 decompressive Lam on 08/31. She continued  to be limited by nonweightbearing RLE as well as numbness and tingling, balance deficits as well as anxiety affecting overall functional status. CIR was recommended for progressive therapies.  Hospital Course: Haley Roberts was admitted to rehab 09/17/2020 for inpatient therapies to consist of PT and OT at least three hours five days a week. Past admission physiatrist, therapy team and rehab RN have worked together to provide customized collaborative inpatient rehab. Marland Kitchen She was noted to be febile with T-max 101 on 09/03 and was pan cultured. CXR, UA, BC were essentially negative therefore no antibiotics initiated. Juven was added to help with low calorie malnutrition and promote wound healing. Bladder training was initiated with bladder scans/PVR checks showing evidence of retention. Her symptoms were copounded by constipation requiring augmentation of bowel regimen. Urecholine as added to help with voiding and retention issues. She developed wound drainage with mild dehiscence on 09/04 and was started on keflex for wound prophylaxis.   CBC done showed rise in WBC and she was found to have UTI with positive UA. Keflex was changed to cefdinir x7 days for broader coverage. Prednisone was tapered down to home dose of 5 mg/day. She has had issues with anxiety limiting therapy therefore [name] for mood stabilization. She was also started on OxyContin and Flexeril for pain management. Issues with lethargy and disorientation were felt to be multifactorial and have resolved.She did have a drop in sodium to 129 therefore was placed on fluid restriction of 1800 cc/day. Serial check of electrolytes shows improvement in sodium to 133 and she was advised to continue fluid restriction at discharge.   Calcium supplement was added due to hypocalcemia. Bowel program has been augmented during her stay with good results. Voiding function has improved and she is voiding  without difficulty difficulty. Drainage as well as wound  dehiscence has resolved and incision is healing well without signs of infection. Back pain has resolved with decreasing use on breakthrough medicine therefore OxyContin was decreased to once a day at discharge with plans to taper off. Her progress has been limited by her nonweightbearing status with knee immobilizer as well as back precautions. She currently requires min assist overall and will continue to receive follow-up home health PT, OT, Aide and RN by [facility] after discharge.   Rehab course: During patient's stay in rehab weekly team conferences were held to monitor patient's progress, set goals and discuss barriers to discharge. At admission, patient required + 2 total assist with mobility and max assist with basic ADL tasks. She has had improvement in activity tolerance, balance, postural control as well as ability to compensate for deficits. She requires supervision with UB care, mod to max assist for LB care and total assist for toileting. She is able to perform SB transfers with CGA/Min Assist after SB set up. Family has been very supportive and education has been done regarding all aspects of care and safety."  Since discharge from the acute rehab facility patient reported being in a subacute facility, she reports some improvement in her strength of the left right leg, right after surgery she could not sit but now she is able to sit.  Currently she is getting physical therapy twice a week and it seems they are planning to discharge her in the next month.  She wants to continue with physical therapy.  Before the surgery she was able to ambulate with a walker but now she is able to sit, able to participate in physical therapy but unable to stand and unassisted.  Her main complaint is right leg weakness and also numbness in the right leg.    OTHER MEDICAL CONDITIONS: Fibromyalgia, RA, DJD, Prediabetes, Anxiety/Depression, HTN   REVIEW OF SYSTEMS: Full 14 system review of systems  performed and negative with exception of: as noted in the HPI  ALLERGIES: Allergies  Allergen Reactions   Infliximab Shortness Of Breath, Other (See Comments) and Hypertension    Remicade   Sulfa Antibiotics Other (See Comments)    Convulsions    Sulfacetamide Sodium-Sulfur Other (See Comments)    Convulsions   Sulfonamide Derivatives Other (See Comments)    BAD HEADACHES/CHILLS/FEVER and CONVULSIONS with Sulfa antibiotics and Sulfacetamide Sodium-Sulfur    HOME MEDICATIONS: Outpatient Medications Prior to Visit  Medication Sig Dispense Refill   acetaminophen (TYLENOL) 325 MG tablet Take 1-2 tablets (325-650 mg total) by mouth every 4 (four) hours as needed for mild pain.     bethanechol (URECHOLINE) 10 MG tablet Take 1 tablet (10 mg total) by mouth 4 (four) times daily. 120 tablet 0   buPROPion (WELLBUTRIN XL) 300 MG 24 hr tablet Take 300 mg by mouth daily.     calcium-vitamin D (OSCAL WITH D) 500-200 MG-UNIT tablet Take 1 tablet by mouth 2 (two) times daily. 60 tablet 0   cyclobenzaprine (FLEXERIL) 5 MG tablet Take 1 tablet (5 mg total) by mouth 3 (three) times daily. 90 tablet 0   escitalopram (LEXAPRO) 20 MG tablet Take 20 mg by mouth daily.     leflunomide (ARAVA) 10 MG tablet 1 tablet     lidocaine (LIDODERM) 5 % UNWRAP AND APPLY 1 PATCH TO SKIN DAILY AT 8 AM AND REMOVE AT 8 PM 30 patch 0   losartan (COZAAR) 100 MG tablet TAKE 1 TABLET(100  MG) BY MOUTH DAILY 90 tablet 3   megestrol (MEGACE) 400 MG/10ML suspension Take 10 mLs (400 mg total) by mouth 2 (two) times daily. 240 mL 0   methocarbamol (ROBAXIN) 500 MG tablet 1 tablet     Multiple Vitamins-Minerals (WOMENS DAILY FORMULA) TABS Take 1 tablet by mouth daily.       oxyCODONE-acetaminophen (PERCOCET) 10-325 MG tablet Take 1 tablet by mouth every 6 (six) hours as needed for pain. 15 tablet 0   polyethylene glycol (MIRALAX / GLYCOLAX) 17 g packet Take 17 g by mouth 2 (two) times daily. 60 each 0   potassium chloride (KLOR-CON)  10 MEQ tablet Take 1 tablet (10 mEq total) by mouth daily. 30 tablet 0   predniSONE (DELTASONE) 5 MG tablet Take 5 mg by mouth daily.     senna-docusate (SENOKOT-S) 8.6-50 MG tablet Take 2 tablets by mouth daily after supper. 60 tablet 0   vitamin C (ASCORBIC ACID) 500 MG tablet Take 500 mg by mouth daily.       vitamin E 180 MG (400 UNITS) capsule take 1 capsule by oral route every day     vitamin E 400 UNIT capsule Take 400 Units by mouth daily.       busPIRone ([name]) 10 MG tablet Take 0.5 tablets (5 mg total) by mouth 3 (three) times daily. 90 tablet 5   No facility-administered medications prior to visit.    PAST MEDICAL HISTORY: Past Medical History:  Diagnosis Date   Anemia    Anxiety    Depression    Fibromyalgia    Hyperglycemia    HYPERGLYCEMIA, FASTING 08/22/2007   Hypertension    Multiple thyroid nodules    gets ultra sound yearly on thryoid   Rheumatoid arthritis(714.0)     PAST SURGICAL HISTORY: Past Surgical History:  Procedure Laterality Date   ABDOMINAL HYSTERECTOMY     with BSO   BREAST BIOPSY     JOINT REPLACEMENT     right knee replacement 2012   TOTAL KNEE ARTHROPLASTY Left 07/07/2014   Procedure: LEFT TOTAL KNEE ARTHROPLASTY;  Surgeon: Paralee Cancel, MD;  Location: [facility];  Service: Orthopedics;  Laterality: Left;    FAMILY HISTORY: Family History  Problem Relation Age of Onset   Cancer Mother 52       breast   Arthritis Father    Hypertension Father    Diabetes Sister    Hypertension Brother    Diabetes Son    Depression Son     SOCIAL HISTORY: Social History   Socioeconomic History   Marital status: Married    Spouse name: Not on file   Number of children: Not on file   Years of education: Not on file   Highest education level: Not on file  Occupational History   Not on file  Tobacco Use   Smoking status: Never   Smokeless tobacco: Never  Vaping Use   Vaping Use: Never used  Substance and Sexual Activity   Alcohol use: No    Drug use: No   Sexual activity: Not Currently  Other Topics Concern   Not on file  Social History Narrative   Reg exercise         Social Determinants of Health   Financial Resource Strain: Not on file  Food Insecurity: Not on file  Transportation Needs: Not on file  Physical Activity: Not on file  Stress: Not on file  Social Connections: Not on file  Intimate Partner Violence: Not on file  PHYSICAL EXAM   GENERAL EXAM/CONSTITUTIONAL: Vitals:  Vitals:   12/08/20 1300  BP: 140/68  Pulse: 96  Weight: 145 lb 12.8 oz (66.1 kg)  Height: 5\' 2"  (1.575 m)   Body mass index is 26.67 kg/m. Wt Readings from Last 3 Encounters:  12/08/20 145 lb 12.8 oz (66.1 kg)  09/24/20 145 lb 8.1 oz (66 kg)  09/10/20 164 lb 0.4 oz (74.4 kg)   Patient is in no distress; well developed, nourished and groomed; neck is supple, sitting in a wheelchair  CARDIOVASCULAR: Examination of carotid arteries is normal; no carotid bruits Regular rate and rhythm, no murmurs Examination of peripheral vascular system by observation and palpation is normal  EYES: Pupils round and reactive to light, Visual fields full to confrontation, Extraocular movements intacts,   MUSCULOSKELETAL: Gait, strength, tone, movements noted in Neurologic exam below  NEUROLOGIC: MENTAL STATUS:  No flowsheet data found. awake, alert, oriented to person, place and time recent and remote memory intact normal attention and concentration language fluent, comprehension intact, naming intact fund of knowledge appropriate  CRANIAL NERVE:  2nd, 3rd, 4th, 6th - pupils equal and reactive to light, visual fields full to confrontation, extraocular muscles intact, no nystagmus 5th - facial sensation symmetric 7th - facial strength symmetric 8th - hearing intact 9th - palate elevates symmetrically, uvula midline 11th - shoulder shrug symmetric 12th - tongue protrusion midline  MOTOR:  normal bulk and tone, full strength in  the BUE,and LLE Right hip flexion 3/5, knee extension 4/5, knee flexion 3/5, ankle plantar and dorsiflexion 0/5  SENSORY:  normal and symmetric to light touch, and vibration  COORDINATION:  finger-nose-finger, fine finger movements normal  REFLEXES:  deep tendon reflexes present and symmetric  GAIT/STATION:  Deferred    DIAGNOSTIC DATA (LABS, IMAGING, TESTING) - I reviewed patient records, labs, notes, testing and imaging myself where available.  Lab Results  Component Value Date   WBC 6.9 10/04/2020   HGB 8.7 (L) 10/04/2020   HCT 27.0 (L) 10/04/2020   MCV 89.4 10/04/2020   PLT 586 (H) 10/04/2020      Component Value Date/Time   NA 133 (L) 10/04/2020 0609   NA 140 03/02/2016 0000   K 4.3 10/04/2020 0609   CL 100 10/04/2020 0609   CO2 22 10/04/2020 0609   GLUCOSE 97 10/04/2020 0609   BUN 25 (H) 10/04/2020 0609   BUN 21 03/02/2016 0000   CREATININE 0.69 10/04/2020 0609   CALCIUM 9.1 10/04/2020 0609   PROT 6.4 (L) 09/18/2020 0504   ALBUMIN 2.9 (L) 09/18/2020 0504   AST 21 09/18/2020 0504   ALT 18 09/18/2020 0504   ALKPHOS 67 09/18/2020 0504   BILITOT 0.2 (L) 09/18/2020 0504   GFRNONAA >60 10/04/2020 0609   GFRAA >60 07/08/2014 0432   Lab Results  Component Value Date   CHOL 168 03/02/2016   HDL 66 03/02/2016   LDLCALC 80 03/02/2016   TRIG 108 03/02/2016   CHOLHDL 4 03/30/2010   Lab Results  Component Value Date   HGBA1C 6.0 01/12/2009   Lab Results  Component Value Date   VITAMINB12 273 03/30/2010   Lab Results  Component Value Date   TSH 1.72 05/28/2015      ASSESSMENT AND PLAN  75 y.o. year old female with rheumatoid arthritis, degenerative joint disease, hypertension, depression, anxiety who is presenting for right leg weakness and numbness since her lumbar surgery.  She is currently getting physical therapy but there is still weakness appreciated on exam, her  right hip flexion is 3/5, knee extension 4/5 knee flexion 3/5 and her foot ankle and  dorsiflexion are both 0/5.  There is evidence of right foot drop.  I will start her work-up by ordering a lumbar MRI with and without contrast, if nonrevealing then we will proceed with a EMG nerve conduction study.  During this time also advised the patient to continue with physical therapy, new order for physical therapy given to patient.  Overall there is deconditioning, fear and anxiety of standing that is contributing to her slow recovery but again I encouraged her to participate with physical therapy and we will do a work-up.  She denies any pain currently not complaining of any pain I will see her in 3 months for follow-up.   1. Spondylolisthesis of lumbar region   2. Status post lumbar surgery   3. Right foot drop   4. Myopathy   5. Neuropathy     PLAN: Continue current medications Aggressive PT and OT  MRI Lumbar spine with and without contrast  Referral to Physical therapy  Follow up in 3 months   Orders Placed This Encounter  Procedures   MR Lumbar Spine W Wo Contrast   Ambulatory referral to Physical Therapy    No orders of the defined types were placed in this encounter.   Return in about 3 months (around 03/10/2021).    Alric Ran, MD 12/09/2020, 10:21 AM  [facility] [address], [place], [place] [address] [phone]

## 2020-12-08 NOTE — Patient Instructions (Signed)
Continue current medications Aggressive PT and OT  MRI Lumbar spine with and without contrast  Referral to Physical therapy  Follow up in 3 months

## 2020-12-13 ENCOUNTER — Telehealth: Payer: Self-pay | Admitting: Neurology

## 2020-12-13 NOTE — Telephone Encounter (Signed)
Left message for a return call

## 2020-12-13 NOTE — Telephone Encounter (Signed)
Brooke from [employer] called stating she had some questions about pt's appt last week. Call back number is [phone].

## 2020-12-13 NOTE — Telephone Encounter (Signed)
Attempted to reach [employer] again before leaving today. Left another message requesting a call back.

## 2020-12-14 NOTE — Telephone Encounter (Signed)
Haley Roberts returned phone call. Would like  a call back at [phone]

## 2020-12-14 NOTE — Telephone Encounter (Addendum)
The patient is in a skilled nursing facility [facility]) recovering from back surgery. I spoke to Dawson who is the physical therapist there. The patient has been in PT and OT for 50+ days with no real gain. She is discharging from these services until further work-up is completed. She has her post-op appt coming up soon with Dr. Christella Noa on 12/27/20. She also has a lumber MRI pending. If unremarkable, Dr. April Manson plans to set up NCV/EMG.  Per vo by Dr. April Manson, okay to continue with plan above.   I spoke to her son, Corene Cornea, who is agreeable with the above information.

## 2020-12-15 DIAGNOSIS — M0589 Other rheumatoid arthritis with rheumatoid factor of multiple sites: Secondary | ICD-10-CM | POA: Diagnosis not present

## 2020-12-15 DIAGNOSIS — M797 Fibromyalgia: Secondary | ICD-10-CM | POA: Diagnosis not present

## 2020-12-15 DIAGNOSIS — M25421 Effusion, right elbow: Secondary | ICD-10-CM | POA: Diagnosis not present

## 2020-12-15 DIAGNOSIS — M5136 Other intervertebral disc degeneration, lumbar region: Secondary | ICD-10-CM | POA: Diagnosis not present

## 2020-12-15 DIAGNOSIS — M150 Primary generalized (osteo)arthritis: Secondary | ICD-10-CM | POA: Diagnosis not present

## 2020-12-16 DIAGNOSIS — M069 Rheumatoid arthritis, unspecified: Secondary | ICD-10-CM | POA: Diagnosis not present

## 2020-12-16 DIAGNOSIS — M978XXS Periprosthetic fracture around other internal prosthetic joint, sequela: Secondary | ICD-10-CM | POA: Diagnosis not present

## 2020-12-16 DIAGNOSIS — G8918 Other acute postprocedural pain: Secondary | ICD-10-CM | POA: Diagnosis not present

## 2020-12-16 DIAGNOSIS — Z9889 Other specified postprocedural states: Secondary | ICD-10-CM | POA: Diagnosis not present

## 2020-12-16 DIAGNOSIS — Z96652 Presence of left artificial knee joint: Secondary | ICD-10-CM | POA: Diagnosis not present

## 2020-12-16 DIAGNOSIS — R339 Retention of urine, unspecified: Secondary | ICD-10-CM | POA: Diagnosis not present

## 2020-12-16 DIAGNOSIS — M797 Fibromyalgia: Secondary | ICD-10-CM | POA: Diagnosis not present

## 2020-12-16 DIAGNOSIS — W19XXXD Unspecified fall, subsequent encounter: Secondary | ICD-10-CM | POA: Diagnosis not present

## 2020-12-16 DIAGNOSIS — F418 Other specified anxiety disorders: Secondary | ICD-10-CM | POA: Diagnosis not present

## 2020-12-16 DIAGNOSIS — E871 Hypo-osmolality and hyponatremia: Secondary | ICD-10-CM | POA: Diagnosis not present

## 2020-12-16 DIAGNOSIS — S92021S Displaced fracture of anterior process of right calcaneus, sequela: Secondary | ICD-10-CM | POA: Diagnosis not present

## 2020-12-16 DIAGNOSIS — S72434S Nondisplaced fracture of medial condyle of right femur, sequela: Secondary | ICD-10-CM | POA: Diagnosis not present

## 2020-12-16 DIAGNOSIS — E559 Vitamin D deficiency, unspecified: Secondary | ICD-10-CM | POA: Diagnosis not present

## 2020-12-16 DIAGNOSIS — I10 Essential (primary) hypertension: Secondary | ICD-10-CM | POA: Diagnosis not present

## 2020-12-16 DIAGNOSIS — D649 Anemia, unspecified: Secondary | ICD-10-CM | POA: Diagnosis not present

## 2020-12-16 DIAGNOSIS — R7303 Prediabetes: Secondary | ICD-10-CM | POA: Diagnosis not present

## 2020-12-16 DIAGNOSIS — R600 Localized edema: Secondary | ICD-10-CM | POA: Diagnosis not present

## 2020-12-16 DIAGNOSIS — M4316 Spondylolisthesis, lumbar region: Secondary | ICD-10-CM | POA: Diagnosis not present

## 2020-12-17 NOTE — Telephone Encounter (Signed)
Re-faxed today and fax conformation received.

## 2020-12-17 NOTE — Telephone Encounter (Signed)
Spoke with Sharyn Lull.  She is refaxing order

## 2020-12-17 NOTE — Telephone Encounter (Signed)
Sharyn Lull calling back about plan of care for patient  Sharyn Lull says she never received fax previously requested 10.26.22  Says all she needs is the signature page signed & dated to be faxed to [phone]  Please call & let her know once fax has been resent [phone]

## 2020-12-21 ENCOUNTER — Telehealth: Payer: Self-pay | Admitting: Neurology

## 2020-12-21 NOTE — Telephone Encounter (Signed)
Patient son Ysidro Evert called and left a voicemail on my phone stating if there is anywhere else they could go to have the MRI. The patient is scheduled right now at [facility] for 01/03/21.  I faxed the order to [facility] to see if they have anything sooner.

## 2020-12-27 DIAGNOSIS — M4316 Spondylolisthesis, lumbar region: Secondary | ICD-10-CM | POA: Diagnosis not present

## 2020-12-30 DIAGNOSIS — M5136 Other intervertebral disc degeneration, lumbar region: Secondary | ICD-10-CM | POA: Diagnosis not present

## 2020-12-30 DIAGNOSIS — Z981 Arthrodesis status: Secondary | ICD-10-CM | POA: Diagnosis not present

## 2020-12-30 DIAGNOSIS — M48061 Spinal stenosis, lumbar region without neurogenic claudication: Secondary | ICD-10-CM | POA: Diagnosis not present

## 2020-12-30 DIAGNOSIS — Z9889 Other specified postprocedural states: Secondary | ICD-10-CM | POA: Diagnosis not present

## 2020-12-30 DIAGNOSIS — M4316 Spondylolisthesis, lumbar region: Secondary | ICD-10-CM | POA: Diagnosis not present

## 2020-12-31 ENCOUNTER — Telehealth: Payer: Self-pay | Admitting: Neurology

## 2020-12-31 ENCOUNTER — Encounter: Payer: Self-pay | Admitting: Neurology

## 2020-12-31 NOTE — Telephone Encounter (Signed)
Error

## 2020-12-31 NOTE — Telephone Encounter (Signed)
Spoke with son Ysidro Evert, discussed recent MRI results showing: 1.   Interbody fusion L3-4 with posterior fusion and laminectomy decompression L3-4 and L4-5. There is a large fluid collection seen in the laminectomy defect and posteriorly in the subcutaneous tissues nearly to the skin surface measuring up to 7.5 x 7.5 cm axial dimension by 13 cm craniocaudad. No significant mass effect on the thecal sac. There is a thin rim of peripheral enhancement without significant surrounding inflammatory stranding. Finding probably reflects seroma although sterility is indeterminate   2.  L3-4 fusion in the decompression with well-maintained central canal.   3.  L4-5 there is 10 mm grade 1-2 anterolisthesis with posterior fusion and laminectomy. Evaluation at the disc level is obscured by hardware probably mild stenosis but the central canal appears well maintained just above and below the disc space. Disc uncovering causes severe bilateral neural foraminal narrowing   He said that his mother condition is the same, has not improved. I also informed him that I will send this report to Dr. Christella Noa for further review and further recommendation.  Follow-up as scheduled.   Alric Ran, MD

## 2021-01-03 ENCOUNTER — Other Ambulatory Visit: Payer: Medicare Other

## 2021-01-31 DIAGNOSIS — R200 Anesthesia of skin: Secondary | ICD-10-CM | POA: Diagnosis not present

## 2021-01-31 DIAGNOSIS — R29898 Other symptoms and signs involving the musculoskeletal system: Secondary | ICD-10-CM | POA: Diagnosis not present

## 2021-02-03 ENCOUNTER — Telehealth: Payer: Self-pay | Admitting: Neurology

## 2021-02-03 NOTE — Telephone Encounter (Signed)
FYI Pt's son has called to request pt be seen before 03-10-21.  Phone rep asked for the reason as to why pt needs to be seen earlier.  Son stated pt's neurosurgeon wants pt seen as soon as possible.  Pt's son accepted 03-11-21.  No call back requested

## 2021-02-04 ENCOUNTER — Other Ambulatory Visit: Payer: Self-pay | Admitting: Neurosurgery

## 2021-02-04 DIAGNOSIS — M4316 Spondylolisthesis, lumbar region: Secondary | ICD-10-CM

## 2021-02-05 DIAGNOSIS — M797 Fibromyalgia: Secondary | ICD-10-CM | POA: Diagnosis not present

## 2021-02-05 DIAGNOSIS — M48061 Spinal stenosis, lumbar region without neurogenic claudication: Secondary | ICD-10-CM | POA: Diagnosis not present

## 2021-02-05 DIAGNOSIS — G629 Polyneuropathy, unspecified: Secondary | ICD-10-CM | POA: Diagnosis not present

## 2021-02-05 DIAGNOSIS — M1990 Unspecified osteoarthritis, unspecified site: Secondary | ICD-10-CM | POA: Diagnosis not present

## 2021-02-05 DIAGNOSIS — F419 Anxiety disorder, unspecified: Secondary | ICD-10-CM | POA: Diagnosis not present

## 2021-02-05 DIAGNOSIS — M629 Disorder of muscle, unspecified: Secondary | ICD-10-CM | POA: Diagnosis not present

## 2021-02-05 DIAGNOSIS — M069 Rheumatoid arthritis, unspecified: Secondary | ICD-10-CM | POA: Diagnosis not present

## 2021-02-05 DIAGNOSIS — L89152 Pressure ulcer of sacral region, stage 2: Secondary | ICD-10-CM | POA: Diagnosis not present

## 2021-02-05 DIAGNOSIS — F32A Depression, unspecified: Secondary | ICD-10-CM | POA: Diagnosis not present

## 2021-02-05 DIAGNOSIS — I10 Essential (primary) hypertension: Secondary | ICD-10-CM | POA: Diagnosis not present

## 2021-02-05 DIAGNOSIS — Z79891 Long term (current) use of opiate analgesic: Secondary | ICD-10-CM | POA: Diagnosis not present

## 2021-02-05 DIAGNOSIS — Z7901 Long term (current) use of anticoagulants: Secondary | ICD-10-CM | POA: Diagnosis not present

## 2021-02-05 DIAGNOSIS — Z9181 History of falling: Secondary | ICD-10-CM | POA: Diagnosis not present

## 2021-02-05 DIAGNOSIS — D649 Anemia, unspecified: Secondary | ICD-10-CM | POA: Diagnosis not present

## 2021-02-05 DIAGNOSIS — R600 Localized edema: Secondary | ICD-10-CM | POA: Diagnosis not present

## 2021-02-05 DIAGNOSIS — K5900 Constipation, unspecified: Secondary | ICD-10-CM | POA: Diagnosis not present

## 2021-02-05 DIAGNOSIS — R7303 Prediabetes: Secondary | ICD-10-CM | POA: Diagnosis not present

## 2021-02-05 DIAGNOSIS — Z7952 Long term (current) use of systemic steroids: Secondary | ICD-10-CM | POA: Diagnosis not present

## 2021-02-05 DIAGNOSIS — M4316 Spondylolisthesis, lumbar region: Secondary | ICD-10-CM | POA: Diagnosis not present

## 2021-02-05 DIAGNOSIS — M21371 Foot drop, right foot: Secondary | ICD-10-CM | POA: Diagnosis not present

## 2021-02-07 DIAGNOSIS — M48061 Spinal stenosis, lumbar region without neurogenic claudication: Secondary | ICD-10-CM | POA: Diagnosis not present

## 2021-02-07 DIAGNOSIS — I10 Essential (primary) hypertension: Secondary | ICD-10-CM | POA: Diagnosis not present

## 2021-02-07 DIAGNOSIS — M21371 Foot drop, right foot: Secondary | ICD-10-CM | POA: Diagnosis not present

## 2021-02-07 DIAGNOSIS — G629 Polyneuropathy, unspecified: Secondary | ICD-10-CM | POA: Diagnosis not present

## 2021-02-07 DIAGNOSIS — L89152 Pressure ulcer of sacral region, stage 2: Secondary | ICD-10-CM | POA: Diagnosis not present

## 2021-02-07 DIAGNOSIS — M4316 Spondylolisthesis, lumbar region: Secondary | ICD-10-CM | POA: Diagnosis not present

## 2021-02-08 ENCOUNTER — Ambulatory Visit: Payer: Medicare Other | Admitting: Neurology

## 2021-02-08 ENCOUNTER — Telehealth: Payer: Self-pay | Admitting: Neurology

## 2021-02-08 NOTE — Telephone Encounter (Signed)
Pt's son, Marija Calamari cancelled appt due to emergency with pt's husband

## 2021-02-09 ENCOUNTER — Other Ambulatory Visit (HOSPITAL_COMMUNITY): Payer: Self-pay | Admitting: Neurosurgery

## 2021-02-09 DIAGNOSIS — M4316 Spondylolisthesis, lumbar region: Secondary | ICD-10-CM

## 2021-02-10 DIAGNOSIS — M4316 Spondylolisthesis, lumbar region: Secondary | ICD-10-CM | POA: Diagnosis not present

## 2021-02-10 DIAGNOSIS — G629 Polyneuropathy, unspecified: Secondary | ICD-10-CM | POA: Diagnosis not present

## 2021-02-10 DIAGNOSIS — M21371 Foot drop, right foot: Secondary | ICD-10-CM | POA: Diagnosis not present

## 2021-02-10 DIAGNOSIS — I10 Essential (primary) hypertension: Secondary | ICD-10-CM | POA: Diagnosis not present

## 2021-02-10 DIAGNOSIS — M48061 Spinal stenosis, lumbar region without neurogenic claudication: Secondary | ICD-10-CM | POA: Diagnosis not present

## 2021-02-10 DIAGNOSIS — L89152 Pressure ulcer of sacral region, stage 2: Secondary | ICD-10-CM | POA: Diagnosis not present

## 2021-02-11 DIAGNOSIS — L89152 Pressure ulcer of sacral region, stage 2: Secondary | ICD-10-CM | POA: Diagnosis not present

## 2021-02-11 DIAGNOSIS — G629 Polyneuropathy, unspecified: Secondary | ICD-10-CM | POA: Diagnosis not present

## 2021-02-11 DIAGNOSIS — I10 Essential (primary) hypertension: Secondary | ICD-10-CM | POA: Diagnosis not present

## 2021-02-11 DIAGNOSIS — M21371 Foot drop, right foot: Secondary | ICD-10-CM | POA: Diagnosis not present

## 2021-02-11 DIAGNOSIS — M4316 Spondylolisthesis, lumbar region: Secondary | ICD-10-CM | POA: Diagnosis not present

## 2021-02-11 DIAGNOSIS — M48061 Spinal stenosis, lumbar region without neurogenic claudication: Secondary | ICD-10-CM | POA: Diagnosis not present

## 2021-02-14 DIAGNOSIS — M4316 Spondylolisthesis, lumbar region: Secondary | ICD-10-CM | POA: Diagnosis not present

## 2021-02-14 DIAGNOSIS — M48061 Spinal stenosis, lumbar region without neurogenic claudication: Secondary | ICD-10-CM | POA: Diagnosis not present

## 2021-02-14 DIAGNOSIS — I10 Essential (primary) hypertension: Secondary | ICD-10-CM | POA: Diagnosis not present

## 2021-02-14 DIAGNOSIS — M21371 Foot drop, right foot: Secondary | ICD-10-CM | POA: Diagnosis not present

## 2021-02-14 DIAGNOSIS — L89152 Pressure ulcer of sacral region, stage 2: Secondary | ICD-10-CM | POA: Diagnosis not present

## 2021-02-14 DIAGNOSIS — G629 Polyneuropathy, unspecified: Secondary | ICD-10-CM | POA: Diagnosis not present

## 2021-02-16 ENCOUNTER — Ambulatory Visit (INDEPENDENT_AMBULATORY_CARE_PROVIDER_SITE_OTHER): Payer: Medicare Other | Admitting: Neurology

## 2021-02-16 ENCOUNTER — Encounter: Payer: Self-pay | Admitting: Neurology

## 2021-02-16 VITALS — BP 158/88 | HR 88 | Ht 62.0 in

## 2021-02-16 DIAGNOSIS — R799 Abnormal finding of blood chemistry, unspecified: Secondary | ICD-10-CM

## 2021-02-16 DIAGNOSIS — R6889 Other general symptoms and signs: Secondary | ICD-10-CM

## 2021-02-16 DIAGNOSIS — M629 Disorder of muscle, unspecified: Secondary | ICD-10-CM | POA: Diagnosis not present

## 2021-02-16 DIAGNOSIS — I10 Essential (primary) hypertension: Secondary | ICD-10-CM | POA: Diagnosis not present

## 2021-02-16 DIAGNOSIS — M21371 Foot drop, right foot: Secondary | ICD-10-CM | POA: Diagnosis not present

## 2021-02-16 DIAGNOSIS — M48061 Spinal stenosis, lumbar region without neurogenic claudication: Secondary | ICD-10-CM | POA: Diagnosis not present

## 2021-02-16 DIAGNOSIS — Z9889 Other specified postprocedural states: Secondary | ICD-10-CM

## 2021-02-16 DIAGNOSIS — G629 Polyneuropathy, unspecified: Secondary | ICD-10-CM | POA: Diagnosis not present

## 2021-02-16 DIAGNOSIS — L89152 Pressure ulcer of sacral region, stage 2: Secondary | ICD-10-CM | POA: Diagnosis not present

## 2021-02-16 DIAGNOSIS — M4316 Spondylolisthesis, lumbar region: Secondary | ICD-10-CM | POA: Diagnosis not present

## 2021-02-16 MED ORDER — PREGABALIN 75 MG PO CAPS: 75.0000 mg | ORAL_CAPSULE | Freq: Every evening | ORAL | 3 refills | Status: DC

## 2021-02-16 NOTE — Progress Notes (Signed)
[facility]  PATIENT: Haley Roberts DOB: [birth year]  REQUESTING CLINICIAN: Binnie Rail, MD HISTORY FROM: Patient and daughter in law  REASON FOR VISIT: Right leg weakness and numbness   HISTORICAL  CHIEF COMPLAINT:  Chief Complaint  Patient presents with   Follow-up    Rm 12. Accompanied by son. Pt has recently been doing PT/OT, states it is going well. Pt presents for 3 month f/u.    INTERVAL HISTORY 02/16/2021:  Patient presents today for follow-up with her son, at last visit plan was to obtain a MRI of her lumbar spine, and to encourage physical therapy.   MRI lumbar spine was completed and it showed 1.   Interbody fusion L3-4 with posterior fusion and laminectomy decompression L3-4 and L4-5. There is a large fluid collection seen in the laminectomy defect and posteriorly in the subcutaneous tissues nearly to the skin surface measuring up to 7.5 x 7.5 cm axial dimension by 13 cm craniocaudad. No significant mass effect on the thecal sac. There is a thin rim of peripheral enhancement without significant surrounding inflammatory stranding. Finding probably reflects seroma although sterility is indeterminate  2.  L3-4 fusion in the decompression with well-maintained central canal. 3.  L4-5 there is 10 mm grade 1-2 anterolisthesis with posterior fusion and laminectomy. Evaluation at the disc level is obscured by hardware probably mild stenosis but the central canal appears well maintained just above and below the disc space. Disc uncovering causes severe bilateral neural foraminal narrowing   She did follow-up with Dr. Christella Noa, neurosurgeon who ordered a CT lumbar spine and also ordered a EMG nerve conduction study.  EMG study was limited but it showed significant peripheral neuropathy involving the bilateral lower extremities right greater than left.  She still reports weakness in the lower extremities and right foot drop, remains wheelchair bound.  She is going  to restart physical therapy soon.  She also reports occasional pain in the lower extremity.    HISTORY OF PRESENT ILLNESS:  This is a 76 year old woman with past medical history of rheumatoid arthritis, degenerative joint disease, hypertension, depression, anxiety who is presenting for right leg weakness and numbness.  Patient reported history of fall and nondisplaced fracture of the right medial femoral condyle, was admitted on August 25, at that time MRI spine revealed advanced facet arthropathy with degenerative anterolisthesis requiring surgery.  Hospital course and rehab course below in the discharge summary.    "Haley Roberts is a 76 y.o. female with history of RA, chronic low back pain, recent fall with nondisplaced periprosthetic fracture of right medial femoral condyle and avulsion fracture right anterior calcaneus, progressive low back pain who was admitted on 09/09/2020 with pain radiating to bilateral buttocks and numbness and tingling. MRI lumbar spine done revealing advanced facet arthropathy with degenerative anterolisthesis L4/5 with severe multifactorial spinal stenosis and question of neural compression of central canal and facet arthropathy with degenerative anterolisthesis L3/4 and L4-5/S1.  Ortho recommended continuing knee immobilizer with NWB RLE and prednisone was increased to 60 mg with slow taper for pain control. She did initially decline surgery however continued to have limitations with significant pain therefore Dr. Christella Noa was consulted for input. Patient elected to undergo L3/4 and L4/5 decompressive Lam on 08/31. She continued to be limited by nonweightbearing RLE as well as numbness and tingling, balance deficits as well as anxiety affecting overall functional status. CIR was recommended for progressive therapies.  Hospital Course: Haley Roberts was admitted to rehab 09/17/2020  for inpatient therapies to consist of PT and OT at least three hours five days a week.  Past admission physiatrist, therapy team and rehab RN have worked together to provide customized collaborative inpatient rehab. Marland Kitchen She was noted to be febile with T-max 101 on 09/03 and was pan cultured. CXR, UA, BC were essentially negative therefore no antibiotics initiated. Juven was added to help with low calorie malnutrition and promote wound healing. Bladder training was initiated with bladder scans/PVR checks showing evidence of retention. Her symptoms were copounded by constipation requiring augmentation of bowel regimen. Urecholine as added to help with voiding and retention issues. She developed wound drainage with mild dehiscence on 09/04 and was started on keflex for wound prophylaxis.   CBC done showed rise in WBC and she was found to have UTI with positive UA. Keflex was changed to cefdinir x7 days for broader coverage. Prednisone was tapered down to home dose of 5 mg/day. She has had issues with anxiety limiting therapy therefore [name] for mood stabilization. She was also started on OxyContin and Flexeril for pain management. Issues with lethargy and disorientation were felt to be multifactorial and have resolved.She did have a drop in sodium to 129 therefore was placed on fluid restriction of 1800 cc/day. Serial check of electrolytes shows improvement in sodium to 133 and she was advised to continue fluid restriction at discharge.   Calcium supplement was added due to hypocalcemia. Bowel program has been augmented during her stay with good results. Voiding function has improved and she is voiding without difficulty difficulty. Drainage as well as wound dehiscence has resolved and incision is healing well without signs of infection. Back pain has resolved with decreasing use on breakthrough medicine therefore OxyContin was decreased to once a day at discharge with plans to taper off. Her progress has been limited by her nonweightbearing status with knee immobilizer as well as back precautions. She  currently requires min assist overall and will continue to receive follow-up home health PT, OT, Aide and RN by [facility] after discharge.   Rehab course: During patient's stay in rehab weekly team conferences were held to monitor patient's progress, set goals and discuss barriers to discharge. At admission, patient required + 2 total assist with mobility and max assist with basic ADL tasks. She has had improvement in activity tolerance, balance, postural control as well as ability to compensate for deficits. She requires supervision with UB care, mod to max assist for LB care and total assist for toileting. She is able to perform SB transfers with CGA/Min Assist after SB set up. Family has been very supportive and education has been done regarding all aspects of care and safety."  Since discharge from the acute rehab facility patient reported being in a subacute facility, she reports some improvement in her strength of the left right leg, right after surgery she could not sit but now she is able to sit.  Currently she is getting physical therapy twice a week and it seems they are planning to discharge her in the next month.  She wants to continue with physical therapy.  Before the surgery she was able to ambulate with a walker but now she is able to sit, able to participate in physical therapy but unable to stand and unassisted.  Her main complaint is right leg weakness and also numbness in the right leg.    OTHER MEDICAL CONDITIONS: Fibromyalgia, RA, DJD, Prediabetes, Anxiety/Depression, HTN   REVIEW OF SYSTEMS: Full 14 system review  of systems performed and negative with exception of: as noted in the HPI  ALLERGIES: Allergies  Allergen Reactions   Infliximab Shortness Of Breath, Other (See Comments) and Hypertension    Remicade   Sulfa Antibiotics Other (See Comments)    Convulsions    Sulfacetamide Sodium-Sulfur Other (See Comments)    Convulsions   Sulfonamide Derivatives Other  (See Comments)    BAD HEADACHES/CHILLS/FEVER and CONVULSIONS with Sulfa antibiotics and Sulfacetamide Sodium-Sulfur    HOME MEDICATIONS: Outpatient Medications Prior to Visit  Medication Sig Dispense Refill   buPROPion (WELLBUTRIN XL) 300 MG 24 hr tablet Take 300 mg by mouth daily.     escitalopram (LEXAPRO) 20 MG tablet Take 20 mg by mouth daily.     losartan (COZAAR) 100 MG tablet TAKE 1 TABLET(100 MG) BY MOUTH DAILY 90 tablet 3   methocarbamol (ROBAXIN) 500 MG tablet 1 tablet     Multiple Vitamins-Minerals (WOMENS DAILY FORMULA) TABS Take 1 tablet by mouth daily.       oxyCODONE-acetaminophen (PERCOCET) 10-325 MG tablet Take 1 tablet by mouth every 6 (six) hours as needed for pain. 15 tablet 0   predniSONE (DELTASONE) 5 MG tablet Take 5 mg by mouth daily.     vitamin C (ASCORBIC ACID) 500 MG tablet Take 500 mg by mouth daily.       vitamin E 180 MG (400 UNITS) capsule take 1 capsule by oral route every day     vitamin E 400 UNIT capsule Take 400 Units by mouth daily.       leflunomide (ARAVA) 10 MG tablet 1 tablet     acetaminophen (TYLENOL) 325 MG tablet Take 1-2 tablets (325-650 mg total) by mouth every 4 (four) hours as needed for mild pain. (Patient not taking: Reported on 02/16/2021)     bethanechol (URECHOLINE) 10 MG tablet Take 1 tablet (10 mg total) by mouth 4 (four) times daily. (Patient not taking: Reported on 02/16/2021) 120 tablet 0   lidocaine (LIDODERM) 5 % UNWRAP AND APPLY 1 PATCH TO SKIN DAILY AT 8 AM AND REMOVE AT 8 PM (Patient not taking: Reported on 02/16/2021) 30 patch 0   megestrol (MEGACE) 400 MG/10ML suspension Take 10 mLs (400 mg total) by mouth 2 (two) times daily. (Patient not taking: Reported on 02/16/2021) 240 mL 0   polyethylene glycol (MIRALAX / GLYCOLAX) 17 g packet Take 17 g by mouth 2 (two) times daily. (Patient not taking: Reported on 02/16/2021) 60 each 0   potassium chloride (KLOR-CON) 10 MEQ tablet Take 1 tablet (10 mEq total) by mouth daily. (Patient not taking:  Reported on 02/16/2021) 30 tablet 0   senna-docusate (SENOKOT-S) 8.6-50 MG tablet Take 2 tablets by mouth daily after supper. (Patient not taking: Reported on 02/16/2021) 60 tablet 0   calcium-vitamin D (OSCAL WITH D) 500-200 MG-UNIT tablet Take 1 tablet by mouth 2 (two) times daily. 60 tablet 0   cyclobenzaprine (FLEXERIL) 5 MG tablet Take 1 tablet (5 mg total) by mouth 3 (three) times daily. 90 tablet 0   No facility-administered medications prior to visit.    PAST MEDICAL HISTORY: Past Medical History:  Diagnosis Date   Anemia    Anxiety    Depression    Fibromyalgia    Hyperglycemia    HYPERGLYCEMIA, FASTING 08/22/2007   Hypertension    Multiple thyroid nodules    gets ultra sound yearly on thryoid   Rheumatoid arthritis(714.0)     PAST SURGICAL HISTORY: Past Surgical History:  Procedure Laterality Date  ABDOMINAL HYSTERECTOMY     with BSO   BREAST BIOPSY     JOINT REPLACEMENT     right knee replacement 2012   TOTAL KNEE ARTHROPLASTY Left 07/07/2014   Procedure: LEFT TOTAL KNEE ARTHROPLASTY;  Surgeon: Paralee Cancel, MD;  Location: [facility];  Service: Orthopedics;  Laterality: Left;    FAMILY HISTORY: Family History  Problem Relation Age of Onset   Cancer Mother 76       breast   Arthritis Father    Hypertension Father    Diabetes Sister    Hypertension Brother    Diabetes Son    Depression Son     SOCIAL HISTORY: Social History   Socioeconomic History   Marital status: Married    Spouse name: Not on file   Number of children: Not on file   Years of education: Not on file   Highest education level: Not on file  Occupational History   Not on file  Tobacco Use   Smoking status: Never   Smokeless tobacco: Never  Vaping Use   Vaping Use: Never used  Substance and Sexual Activity   Alcohol use: No   Drug use: No   Sexual activity: Not Currently  Other Topics Concern   Not on file  Social History Narrative   Reg exercise         Social Determinants of  Health   Financial Resource Strain: Not on file  Food Insecurity: Not on file  Transportation Needs: Not on file  Physical Activity: Not on file  Stress: Not on file  Social Connections: Not on file  Intimate Partner Violence: Not on file    PHYSICAL EXAM   GENERAL EXAM/CONSTITUTIONAL: Vitals:  Vitals:   02/16/21 1248  BP: (!) 158/88  Pulse: 88  Height: _0  (1.575 m)    Body mass index is 26.67 kg/m. Wt Readings from Last 3 Encounters:  12/08/20 145 lb 12.8 oz (66.1 kg)  09/24/20 145 lb 8.1 oz (66 kg)  09/10/20 164 lb 0.4 oz (74.4 kg)   Patient is in no distress; well developed, nourished and groomed; neck is supple, sitting in a wheelchair  CARDIOVASCULAR: Examination of carotid arteries is normal; no carotid bruits Regular rate and rhythm, no murmurs Examination of peripheral vascular system by observation and palpation is normal  EYES: Pupils round and reactive to light, Visual fields full to confrontation, Extraocular movements intacts,   MUSCULOSKELETAL: Gait, strength, tone, movements noted in Neurologic exam below  NEUROLOGIC: MENTAL STATUS:  No flowsheet data found. awake, alert, oriented to person, place and time recent and remote memory intact normal attention and concentration language fluent, comprehension intact, naming intact fund of knowledge appropriate  CRANIAL NERVE:  2nd, 3rd, 4th, 6th - pupils equal and reactive to light, visual fields full to confrontation, extraocular muscles intact, no nystagmus 5th - facial sensation symmetric 7th - facial strength symmetric 8th - hearing intact 9th - palate elevates symmetrically, uvula midline 11th - shoulder shrug symmetric 12th - tongue protrusion midline  MOTOR:  normal bulk and tone, full strength in the BUE,and LLE Right hip flexion 3/5, knee extension 4/5, knee flexion 3/5, ankle plantar and dorsiflexion 0/5  SENSORY:  normal and symmetric to light touch, and  vibration  COORDINATION:  finger-nose-finger, fine finger movements normal  REFLEXES:  deep tendon reflexes present and symmetric  GAIT/STATION:  Deferred    DIAGNOSTIC DATA (LABS, IMAGING, TESTING) - I reviewed patient records, labs, notes, testing and imaging myself where available.  Lab Results  Component Value Date   WBC 6.9 10/04/2020   HGB 8.7 (L) 10/04/2020   HCT 27.0 (L) 10/04/2020   MCV 89.4 10/04/2020   PLT 586 (H) 10/04/2020      Component Value Date/Time   NA 133 (L) 10/04/2020 0609   NA 140 03/02/2016 0000   K 4.3 10/04/2020 0609   CL 100 10/04/2020 0609   CO2 22 10/04/2020 0609   GLUCOSE 97 10/04/2020 0609   BUN 25 (H) 10/04/2020 0609   BUN 21 03/02/2016 0000   CREATININE 0.69 10/04/2020 0609   CALCIUM 9.1 10/04/2020 0609   PROT 6.4 (L) 09/18/2020 0504   ALBUMIN 2.9 (L) 09/18/2020 0504   AST 21 09/18/2020 0504   ALT 18 09/18/2020 0504   ALKPHOS 67 09/18/2020 0504   BILITOT 0.2 (L) 09/18/2020 0504   GFRNONAA >60 10/04/2020 0609   GFRAA >60 07/08/2014 0432   Lab Results  Component Value Date   CHOL 168 03/02/2016   HDL 66 03/02/2016   LDLCALC 80 03/02/2016   TRIG 108 03/02/2016   CHOLHDL 4 03/30/2010   Lab Results  Component Value Date   HGBA1C 6.0 01/12/2009   Lab Results  Component Value Date   VITAMINB12 273 03/30/2010   Lab Results  Component Value Date   TSH 1.72 05/28/2015    MRI Lumbar spine 12/30/2020 1.   Interbody fusion L3-4 with posterior fusion and laminectomy decompression L3-4 and L4-5. There is a large fluid collection seen in the laminectomy defect and posteriorly in the subcutaneous tissues nearly to the skin surface measuring up to 7.5 x 7.5 cm axial dimension by 13 cm craniocaudad. No significant mass effect on the thecal sac. There is a thin rim of peripheral enhancement without significant surrounding inflammatory stranding. Finding probably reflects seroma although sterility is indeterminate   2.  L3-4 fusion in  the decompression with well-maintained central canal.   3.  L4-5 there is 10 mm grade 1-2 anterolisthesis with posterior fusion and laminectomy. Evaluation at the disc level is obscured by hardware probably mild stenosis but the central canal appears well maintained just above and below the disc space. Disc uncovering causes severe bilateral neural foraminal narrowing    EMG:  In summation, there does appear to be a significant peripheral neurologic process involving lower extremities bilaterally fairly diffusely, right greater than left.  However, due to the limited nature of the testing due to the limitation noted above, I could not draw any specific diagnostic conclusion.  Could not rule out entities such as polyradiculopathy or ischemic injury, plexopathy, peripheral polyneuropathy such as an acute demyelinating process or even superimposed mononeuropathies.  If the pedal edema and pressure sores in her feet can be resolved symptoms certainly could consider repeat study.  Also second opinion with neurology would seem reasonable.    ASSESSMENT AND PLAN  77 y.o. year old female with rheumatoid arthritis, degenerative joint disease, hypertension, depression, anxiety who is presenting for follow up for right leg weakness and numbness since her lumbar surgery.  She completed her lumbar spine MRI, results above, pending lumbar CT and follow up with neurosurgery Dr. Christella Noa. She did also completed a NCS/EMG which showed a peripheral neurologic process but a specific diagnosis could not be made due to limited nature of the testing. I will order the peripheral neuropathy labs for further workup. I encourage patient to continue with aggressive physical therapy. She is complaining of mild pain in the lower extremities, I will try her on  Pregabalin 75 mg nightly. Follow up with Dr. Christella Noa as scheduled, and return in 6 months.     1. Neuropathy   2. Other general symptoms and signs   3. Decreased activity    4. Abnormal finding of blood chemistry, unspecified   5. Disorder of muscle, unspecified   6. Status post lumbar surgery   7. Right foot drop     Patient Instructions  Follow up with Dr. Christella Noa as scheduled  Encourage aggressive physical therapy  Can consider repeat EMG/NCS in the future  Will obtain neuropathy labs today, will contact patient to go over results  Return in 6 months or sooner if worse   Orders Placed This Encounter  Procedures   CBC with diff   CMP   Vitamin B12   MMA   Homocysteine   A1c   TSH   SPEP with IFE   ANA w/Reflex   SSA, SSB   ESR   CRP    Meds ordered this encounter  Medications   pregabalin (LYRICA) 75 MG capsule    Sig: Take 1 capsule (75 mg total) by mouth at bedtime.    Dispense:  30 capsule    Refill:  3     Return in about 6 months (around 08/16/2021).    Alric Ran, MD 02/16/2021, 2:13 PM  [facility] [address] [place], [place] [address] [phone]

## 2021-02-16 NOTE — Patient Instructions (Signed)
Follow up with Dr. Christella Noa as scheduled  Encourage aggressive physical therapy  Can consider repeat EMG/NCS in the future  Will obtain neuropathy labs today, will contact patient to go over results  Return in 6 months or sooner if worse

## 2021-02-17 ENCOUNTER — Ambulatory Visit (HOSPITAL_COMMUNITY): Payer: Medicare Other

## 2021-02-17 ENCOUNTER — Encounter (HOSPITAL_COMMUNITY): Payer: Self-pay

## 2021-02-17 DIAGNOSIS — M48061 Spinal stenosis, lumbar region without neurogenic claudication: Secondary | ICD-10-CM | POA: Diagnosis not present

## 2021-02-17 DIAGNOSIS — G629 Polyneuropathy, unspecified: Secondary | ICD-10-CM | POA: Diagnosis not present

## 2021-02-17 DIAGNOSIS — M4316 Spondylolisthesis, lumbar region: Secondary | ICD-10-CM | POA: Diagnosis not present

## 2021-02-17 DIAGNOSIS — I10 Essential (primary) hypertension: Secondary | ICD-10-CM | POA: Diagnosis not present

## 2021-02-17 DIAGNOSIS — L89152 Pressure ulcer of sacral region, stage 2: Secondary | ICD-10-CM | POA: Diagnosis not present

## 2021-02-17 DIAGNOSIS — M21371 Foot drop, right foot: Secondary | ICD-10-CM | POA: Diagnosis not present

## 2021-02-18 DIAGNOSIS — L89152 Pressure ulcer of sacral region, stage 2: Secondary | ICD-10-CM | POA: Diagnosis not present

## 2021-02-18 DIAGNOSIS — G629 Polyneuropathy, unspecified: Secondary | ICD-10-CM | POA: Diagnosis not present

## 2021-02-18 DIAGNOSIS — I10 Essential (primary) hypertension: Secondary | ICD-10-CM | POA: Diagnosis not present

## 2021-02-18 DIAGNOSIS — M4316 Spondylolisthesis, lumbar region: Secondary | ICD-10-CM | POA: Diagnosis not present

## 2021-02-18 DIAGNOSIS — M21371 Foot drop, right foot: Secondary | ICD-10-CM | POA: Diagnosis not present

## 2021-02-18 DIAGNOSIS — M48061 Spinal stenosis, lumbar region without neurogenic claudication: Secondary | ICD-10-CM | POA: Diagnosis not present

## 2021-02-21 DIAGNOSIS — L89152 Pressure ulcer of sacral region, stage 2: Secondary | ICD-10-CM | POA: Diagnosis not present

## 2021-02-21 DIAGNOSIS — M21371 Foot drop, right foot: Secondary | ICD-10-CM | POA: Diagnosis not present

## 2021-02-21 DIAGNOSIS — M48061 Spinal stenosis, lumbar region without neurogenic claudication: Secondary | ICD-10-CM | POA: Diagnosis not present

## 2021-02-21 DIAGNOSIS — I10 Essential (primary) hypertension: Secondary | ICD-10-CM | POA: Diagnosis not present

## 2021-02-21 DIAGNOSIS — G629 Polyneuropathy, unspecified: Secondary | ICD-10-CM | POA: Diagnosis not present

## 2021-02-21 DIAGNOSIS — M4316 Spondylolisthesis, lumbar region: Secondary | ICD-10-CM | POA: Diagnosis not present

## 2021-02-21 LAB — CBC WITH DIFFERENTIAL/PLATELET
Basophils Absolute: 0.1 10*3/uL (ref 0.0–0.2)
Basos: 1 %
EOS (ABSOLUTE): 0.1 10*3/uL (ref 0.0–0.4)
Eos: 1 %
Hematocrit: 26.9 % — ABNORMAL LOW (ref 34.0–46.6)
Hemoglobin: 8.3 g/dL — ABNORMAL LOW (ref 11.1–15.9)
Immature Grans (Abs): 0 10*3/uL (ref 0.0–0.1)
Immature Granulocytes: 1 %
Lymphocytes Absolute: 0.8 10*3/uL (ref 0.7–3.1)
Lymphs: 11 %
MCH: 27.6 pg (ref 26.6–33.0)
MCHC: 30.9 g/dL — ABNORMAL LOW (ref 31.5–35.7)
MCV: 89 fL (ref 79–97)
Monocytes Absolute: 0.5 10*3/uL (ref 0.1–0.9)
Monocytes: 7 %
Neutrophils Absolute: 5.7 10*3/uL (ref 1.4–7.0)
Neutrophils: 79 %
Platelets: 587 10*3/uL — ABNORMAL HIGH (ref 150–450)
RBC: 3.01 x10E6/uL — ABNORMAL LOW (ref 3.77–5.28)
RDW: 15.8 % — ABNORMAL HIGH (ref 11.7–15.4)
WBC: 7.2 10*3/uL (ref 3.4–10.8)

## 2021-02-21 LAB — METHYLMALONIC ACID, SERUM: Methylmalonic Acid: 190 nmol/L (ref 0–378)

## 2021-02-21 LAB — COMPREHENSIVE METABOLIC PANEL
ALT: 15 IU/L (ref 0–32)
AST: 27 IU/L (ref 0–40)
Albumin/Globulin Ratio: 1.2 (ref 1.2–2.2)
Albumin: 3.7 g/dL (ref 3.7–4.7)
Alkaline Phosphatase: 125 IU/L — ABNORMAL HIGH (ref 44–121)
BUN/Creatinine Ratio: 19 (ref 12–28)
BUN: 13 mg/dL (ref 8–27)
Bilirubin Total: <0.2 mg/dL (ref 0.0–1.2)
CO2: 21 mmol/L (ref 20–29)
Calcium: 9.3 mg/dL (ref 8.7–10.3)
Chloride: 102 mmol/L (ref 96–106)
Creatinine, Ser: 0.67 mg/dL (ref 0.57–1.00)
Globulin, Total: 3 g/dL (ref 1.5–4.5)
Glucose: 90 mg/dL (ref 70–99)
Potassium: 4.9 mmol/L (ref 3.5–5.2)
Sodium: 140 mmol/L (ref 134–144)
Total Protein: 6.7 g/dL (ref 6.0–8.5)
eGFR: 92 mL/min/{1.73_m2} (ref >59)

## 2021-02-21 LAB — HEMOGLOBIN A1C
Est. average glucose Bld gHb Est-mCnc: 114 mg/dL
Hgb A1c MFr Bld: 5.6 % (ref 4.8–5.6)

## 2021-02-21 LAB — MULTIPLE MYELOMA PANEL, SERUM
Albumin SerPl Elph-Mcnc: 3.1 g/dL (ref 2.9–4.4)
Albumin/Glob SerPl: 0.9 (ref 0.7–1.7)
Alpha 1: 0.4 g/dL (ref 0.0–0.4)
Alpha2 Glob SerPl Elph-Mcnc: 0.9 g/dL (ref 0.4–1.0)
B-Globulin SerPl Elph-Mcnc: 1 g/dL (ref 0.7–1.3)
Gamma Glob SerPl Elph-Mcnc: 1.3 g/dL (ref 0.4–1.8)
Globulin, Total: 3.6 g/dL (ref 2.2–3.9)
IgA/Immunoglobulin A, Serum: 190 mg/dL (ref 64–422)
IgG (Immunoglobin G), Serum: 1384 mg/dL (ref 586–1602)
IgM (Immunoglobulin M), Srm: 74 mg/dL (ref 26–217)

## 2021-02-21 LAB — VITAMIN B12: Vitamin B-12: 621 pg/mL (ref 232–1245)

## 2021-02-21 LAB — SEDIMENTATION RATE: Sed Rate: 57 mm/hr — ABNORMAL HIGH (ref 0–40)

## 2021-02-21 LAB — SJOGREN'S SYNDROME ANTIBODS(SSA + SSB)
ENA SSA (RO) Ab: >8 AI — ABNORMAL HIGH (ref 0.0–0.9)
ENA SSB (LA) Ab: 1.3 AI — ABNORMAL HIGH (ref 0.0–0.9)

## 2021-02-21 LAB — HOMOCYSTEINE: Homocysteine: 13.4 umol/L (ref 0.0–19.2)

## 2021-02-21 LAB — C-REACTIVE PROTEIN: CRP: 38 mg/L — ABNORMAL HIGH (ref 0–10)

## 2021-02-21 LAB — TSH: TSH: 2.9 u[IU]/mL (ref 0.450–4.500)

## 2021-02-22 ENCOUNTER — Ambulatory Visit (HOSPITAL_COMMUNITY)
Admission: RE | Admit: 2021-02-22 | Discharge: 2021-02-22 | Disposition: A | Discharge status: Home / Self Care | Payer: Medicare Other | Source: Ambulatory Visit | Attending: Neurosurgery | Admitting: Neurosurgery

## 2021-02-22 ENCOUNTER — Other Ambulatory Visit: Payer: Self-pay

## 2021-02-22 DIAGNOSIS — M4316 Spondylolisthesis, lumbar region: Secondary | ICD-10-CM | POA: Diagnosis not present

## 2021-02-22 LAB — ENA+DNA/DS+ANTICH+CENTRO+FA...
Anti JO-1: <0.2 AI (ref 0.0–0.9)
Antiribosomal P Antibodies: <0.2 AI (ref 0.0–0.9)
Centromere Ab Screen: <0.2 AI (ref 0.0–0.9)
Chromatin Ab SerPl-aCnc: <0.2 AI (ref 0.0–0.9)
ENA RNP Ab: 0.3 AI (ref 0.0–0.9)
ENA SM Ab Ser-aCnc: 0.2 AI (ref 0.0–0.9)
ENA SSA (RO) Ab: >8 AI — ABNORMAL HIGH (ref 0.0–0.9)
ENA SSB (LA) Ab: 1.4 AI — ABNORMAL HIGH (ref 0.0–0.9)
Scleroderma (Scl-70) (ENA) Antibody, IgG: <0.2 AI (ref 0.0–0.9)
Smith/RNP Antibodies: <0.2 AI (ref 0.0–0.9)
Speckled Pattern: 1:160 {titer} — ABNORMAL HIGH
dsDNA Ab: 2 IU/mL (ref 0–9)

## 2021-02-22 LAB — ANA W/REFLEX: ANA Titer 1: POSITIVE — AB

## 2021-02-23 ENCOUNTER — Telehealth: Payer: Self-pay

## 2021-02-23 DIAGNOSIS — G629 Polyneuropathy, unspecified: Secondary | ICD-10-CM | POA: Diagnosis not present

## 2021-02-23 DIAGNOSIS — M4316 Spondylolisthesis, lumbar region: Secondary | ICD-10-CM | POA: Diagnosis not present

## 2021-02-23 DIAGNOSIS — M48061 Spinal stenosis, lumbar region without neurogenic claudication: Secondary | ICD-10-CM | POA: Diagnosis not present

## 2021-02-23 DIAGNOSIS — L89152 Pressure ulcer of sacral region, stage 2: Secondary | ICD-10-CM | POA: Diagnosis not present

## 2021-02-23 DIAGNOSIS — M21371 Foot drop, right foot: Secondary | ICD-10-CM | POA: Diagnosis not present

## 2021-02-23 DIAGNOSIS — I10 Essential (primary) hypertension: Secondary | ICD-10-CM | POA: Diagnosis not present

## 2021-02-23 NOTE — Telephone Encounter (Signed)
Haley Roberts states she need Dr. Quay Burow to say that the pt has stage 2 PU.   Haley Roberts states that it was found 02/05/21 when they completed her Eval.  Haley Roberts call Back is [phone] Fax [phone]

## 2021-02-24 ENCOUNTER — Telehealth: Payer: Self-pay | Admitting: Internal Medicine

## 2021-02-24 DIAGNOSIS — L89152 Pressure ulcer of sacral region, stage 2: Secondary | ICD-10-CM | POA: Diagnosis not present

## 2021-02-24 DIAGNOSIS — M21371 Foot drop, right foot: Secondary | ICD-10-CM | POA: Diagnosis not present

## 2021-02-24 DIAGNOSIS — I10 Essential (primary) hypertension: Secondary | ICD-10-CM | POA: Diagnosis not present

## 2021-02-24 DIAGNOSIS — G629 Polyneuropathy, unspecified: Secondary | ICD-10-CM | POA: Diagnosis not present

## 2021-02-24 DIAGNOSIS — M4316 Spondylolisthesis, lumbar region: Secondary | ICD-10-CM | POA: Diagnosis not present

## 2021-02-24 DIAGNOSIS — M48061 Spinal stenosis, lumbar region without neurogenic claudication: Secondary | ICD-10-CM | POA: Diagnosis not present

## 2021-02-24 NOTE — Telephone Encounter (Signed)
[place] - right lower leg is having pain and a hard knot on back of upper calf.  Leg is red but is not any hotter than other leg.  FYI just because of her recent history of blood clots.

## 2021-02-24 NOTE — Telephone Encounter (Signed)
Message left for Anderson Malta that message was unclear on what she was needing.  If she calls back please see if there is a form or if she is just needing the verbal from Dr. Quay Burow to confirm the stage 2 PU.

## 2021-02-24 NOTE — Telephone Encounter (Signed)
Needs to be evaluated - r/o infection and blood clot

## 2021-02-25 ENCOUNTER — Ambulatory Visit: Payer: Medicare Other | Admitting: Family

## 2021-02-25 ENCOUNTER — Telehealth: Payer: Self-pay | Admitting: Internal Medicine

## 2021-02-25 NOTE — Telephone Encounter (Signed)
Spoke with pots son Ysidro Evert and was able to inform him of Dr. Quay Burow instructions for pt to be seen ASAP due to sxs of lower rt leg.  Pts son states he will take pt to [facility] to be evaluated for the sxs.

## 2021-02-25 NOTE — Telephone Encounter (Signed)
Patients son, Ysidro Evert called. Stated pt lift is broken and he cannot get her in for the 2pm appt at the [facility] location. Ysidro Evert was advised via voice mail to call dr burns office over at [facility] and speak to them directly to see what her recommendations are for the next couple of days.

## 2021-02-28 ENCOUNTER — Encounter: Payer: Self-pay | Admitting: Internal Medicine

## 2021-02-28 ENCOUNTER — Telehealth: Payer: Self-pay | Admitting: Internal Medicine

## 2021-02-28 DIAGNOSIS — M21371 Foot drop, right foot: Secondary | ICD-10-CM | POA: Diagnosis not present

## 2021-02-28 DIAGNOSIS — I10 Essential (primary) hypertension: Secondary | ICD-10-CM | POA: Diagnosis not present

## 2021-02-28 DIAGNOSIS — M48061 Spinal stenosis, lumbar region without neurogenic claudication: Secondary | ICD-10-CM | POA: Diagnosis not present

## 2021-02-28 DIAGNOSIS — G629 Polyneuropathy, unspecified: Secondary | ICD-10-CM | POA: Diagnosis not present

## 2021-02-28 DIAGNOSIS — M4316 Spondylolisthesis, lumbar region: Secondary | ICD-10-CM | POA: Diagnosis not present

## 2021-02-28 DIAGNOSIS — L89152 Pressure ulcer of sacral region, stage 2: Secondary | ICD-10-CM | POA: Diagnosis not present

## 2021-02-28 NOTE — Telephone Encounter (Signed)
Pt's son Ysidro Evert calling for refills on 10 medications, caller did not know the name of the medications, advised caller I would need the names of the medication in order to submit a refill request  Advised caller to call pharmacy and have pharmacy submit the request if he could not find the medication names

## 2021-02-28 NOTE — Progress Notes (Unsigned)
Subjective:    Patient ID: Haley Roberts, female    DOB: Nov 06, 1945, 76 y.o.   MRN: [id number]  This visit occurred during the SARS-CoV-2 public health emergency.  Safety protocols were in place, including screening questions prior to the visit, additional usage of staff PPE, and extensive cleaning of exam room while observing appropriate contact time as indicated for disinfecting solutions.    HPI The patient is here for an acute visit and chronic visit - follow up of chronic medical problems.  Leg pain -   Htn -   Medications and allergies reviewed with patient and updated if appropriate.  Patient Active Problem List   Diagnosis Date Noted   Situational anxiety 10/07/2020   Post-operative pain    Acute blood loss anemia    Right leg pain    Hyponatremia    Status post lumbar surgery 09/17/2020   Spondylolisthesis of lumbar region 09/16/2020   Intractable low back pain 09/09/2020   Difficulty urinating 11/18/2019   Hesitancy of micturition 11/18/2019   Bilateral leg edema 05/03/2018   Prediabetes 12/27/2017   Fibromyalgia 05/28/2015   S/P left TKA 07/07/2014   S/P knee replacement 07/07/2014   Right knee DJD 08/29/2010   Rheumatoid arthritis ([place]) 03/30/2010   Osteoarthritis 01/12/2009   Non-toxic nodular goiter 08/22/2007   Essential hypertension 08/22/2007   ANEMIA-NOS 05/31/2006   Depression 05/31/2006   ABNORMAL THYROID FUNCTION TESTS 05/31/2006    Current Outpatient Medications on File Prior to Visit  Medication Sig Dispense Refill   acetaminophen (TYLENOL) 325 MG tablet Take 1-2 tablets (325-650 mg total) by mouth every 4 (four) hours as needed for mild pain. (Patient not taking: Reported on 02/16/2021)     bethanechol (URECHOLINE) 10 MG tablet Take 1 tablet (10 mg total) by mouth 4 (four) times daily. (Patient not taking: Reported on 02/16/2021) 120 tablet 0   buPROPion (WELLBUTRIN XL) 300 MG 24 hr tablet Take 300 mg by mouth daily.     escitalopram  (LEXAPRO) 20 MG tablet Take 20 mg by mouth daily.     lidocaine (LIDODERM) 5 % UNWRAP AND APPLY 1 PATCH TO SKIN DAILY AT 8 AM AND REMOVE AT 8 PM (Patient not taking: Reported on 02/16/2021) 30 patch 0   losartan (COZAAR) 100 MG tablet TAKE 1 TABLET(100 MG) BY MOUTH DAILY 90 tablet 3   megestrol (MEGACE) 400 MG/10ML suspension Take 10 mLs (400 mg total) by mouth 2 (two) times daily. (Patient not taking: Reported on 02/16/2021) 240 mL 0   methocarbamol (ROBAXIN) 500 MG tablet 1 tablet     Multiple Vitamins-Minerals (WOMENS DAILY FORMULA) TABS Take 1 tablet by mouth daily.       oxyCODONE-acetaminophen (PERCOCET) 10-325 MG tablet Take 1 tablet by mouth every 6 (six) hours as needed for pain. 15 tablet 0   polyethylene glycol (MIRALAX / GLYCOLAX) 17 g packet Take 17 g by mouth 2 (two) times daily. (Patient not taking: Reported on 02/16/2021) 60 each 0   potassium chloride (KLOR-CON) 10 MEQ tablet Take 1 tablet (10 mEq total) by mouth daily. (Patient not taking: Reported on 02/16/2021) 30 tablet 0   predniSONE (DELTASONE) 5 MG tablet Take 5 mg by mouth daily.     pregabalin (LYRICA) 75 MG capsule Take 1 capsule (75 mg total) by mouth at bedtime. 30 capsule 3   senna-docusate (SENOKOT-S) 8.6-50 MG tablet Take 2 tablets by mouth daily after supper. (Patient not taking: Reported on 02/16/2021) 60 tablet 0   vitamin  C (ASCORBIC ACID) 500 MG tablet Take 500 mg by mouth daily.       vitamin E 180 MG (400 UNITS) capsule take 1 capsule by oral route every day     vitamin E 400 UNIT capsule Take 400 Units by mouth daily.       No current facility-administered medications on file prior to visit.    Past Medical History:  Diagnosis Date   Anemia    Anxiety    Depression    Fibromyalgia    Hyperglycemia    HYPERGLYCEMIA, FASTING 08/22/2007   Hypertension    Multiple thyroid nodules    gets ultra sound yearly on thryoid   Rheumatoid arthritis(714.0)     Past Surgical History:  Procedure Laterality Date    ABDOMINAL HYSTERECTOMY     with BSO   BREAST BIOPSY     JOINT REPLACEMENT     right knee replacement 2012   TOTAL KNEE ARTHROPLASTY Left 07/07/2014   Procedure: LEFT TOTAL KNEE ARTHROPLASTY;  Surgeon: Paralee Cancel, MD;  Location: [facility];  Service: Orthopedics;  Laterality: Left;    Social History   Socioeconomic History   Marital status: Married    Spouse name: Not on file   Number of children: Not on file   Years of education: Not on file   Highest education level: Not on file  Occupational History   Not on file  Tobacco Use   Smoking status: Never   Smokeless tobacco: Never  Vaping Use   Vaping Use: Never used  Substance and Sexual Activity   Alcohol use: No   Drug use: No   Sexual activity: Not Currently  Other Topics Concern   Not on file  Social History Narrative   Reg exercise         Social Determinants of Health   Financial Resource Strain: Not on file  Food Insecurity: Not on file  Transportation Needs: Not on file  Physical Activity: Not on file  Stress: Not on file  Social Connections: Not on file    Family History  Problem Relation Age of Onset   Cancer Mother 49       breast   Arthritis Father    Hypertension Father    Diabetes Sister    Hypertension Brother    Diabetes Son    Depression Son     Review of Systems     Objective:  There were no vitals filed for this visit. BP Readings from Last 3 Encounters:  02/16/21 (!) 158/88  12/08/20 140/68  10/25/20 102/72   Wt Readings from Last 3 Encounters:  12/08/20 145 lb 12.8 oz (66.1 kg)  09/24/20 145 lb 8.1 oz (66 kg)  09/10/20 164 lb 0.4 oz (74.4 kg)   There is no height or weight on file to calculate BMI.   Physical Exam    Constitutional: Appears well-developed and well-nourished. No distress.  Head: Normocephalic and atraumatic.  Neck: Neck supple. No tracheal deviation present. No thyromegaly present.  No cervical lymphadenopathy Cardiovascular: Normal rate, regular rhythm and  normal heart sounds.  No murmur heard. No carotid bruit .  No edema Pulmonary/Chest: Effort normal and breath sounds normal. No respiratory distress. No has no wheezes. No rales.  Skin: Skin is warm and dry. Not diaphoretic.  Psychiatric: Normal mood and affect. Behavior is normal.       Assessment & Plan:    See Problem List for Assessment and Plan of chronic medical problems.

## 2021-03-01 DIAGNOSIS — M4316 Spondylolisthesis, lumbar region: Secondary | ICD-10-CM | POA: Diagnosis not present

## 2021-03-02 ENCOUNTER — Ambulatory Visit: Payer: Medicare Other | Admitting: Internal Medicine

## 2021-03-02 ENCOUNTER — Other Ambulatory Visit: Payer: Self-pay | Admitting: Internal Medicine

## 2021-03-02 ENCOUNTER — Encounter: Payer: Self-pay | Admitting: Internal Medicine

## 2021-03-02 DIAGNOSIS — G629 Polyneuropathy, unspecified: Secondary | ICD-10-CM | POA: Diagnosis not present

## 2021-03-02 DIAGNOSIS — L89152 Pressure ulcer of sacral region, stage 2: Secondary | ICD-10-CM | POA: Diagnosis not present

## 2021-03-02 DIAGNOSIS — M21371 Foot drop, right foot: Secondary | ICD-10-CM | POA: Diagnosis not present

## 2021-03-02 DIAGNOSIS — I10 Essential (primary) hypertension: Secondary | ICD-10-CM

## 2021-03-02 DIAGNOSIS — M4316 Spondylolisthesis, lumbar region: Secondary | ICD-10-CM | POA: Diagnosis not present

## 2021-03-02 DIAGNOSIS — M48061 Spinal stenosis, lumbar region without neurogenic claudication: Secondary | ICD-10-CM | POA: Diagnosis not present

## 2021-03-02 MED ORDER — APIXABAN 2.5 MG PO TABS: 2.5000 mg | ORAL_TABLET | Freq: Two times a day (BID) | ORAL | 0 refills | Status: DC

## 2021-03-02 MED ORDER — BETHANECHOL CHLORIDE 10 MG PO TABS: 10.0000 mg | ORAL_TABLET | Freq: Four times a day (QID) | ORAL | 0 refills | Status: DC

## 2021-03-02 MED ORDER — POTASSIUM CHLORIDE ER 10 MEQ PO TBCR: 10.0000 meq | EXTENDED_RELEASE_TABLET | Freq: Every day | ORAL | 0 refills | Status: DC

## 2021-03-02 MED ORDER — PREDNISONE 5 MG PO TABS: 5.0000 mg | ORAL_TABLET | Freq: Every day | ORAL | 0 refills | Status: DC

## 2021-03-02 MED ORDER — LOSARTAN POTASSIUM 25 MG PO TABS: 25.0000 mg | ORAL_TABLET | Freq: Every day | ORAL | 0 refills | Status: DC

## 2021-03-02 MED ORDER — CYCLOBENZAPRINE HCL 10 MG PO TABS: 10.0000 mg | ORAL_TABLET | Freq: Three times a day (TID) | ORAL | 0 refills | Status: DC | PRN

## 2021-03-02 MED ORDER — MIRTAZAPINE 15 MG PO TABS: 15.0000 mg | ORAL_TABLET | Freq: Every day | ORAL | 0 refills | Status: DC

## 2021-03-02 MED ORDER — ESCITALOPRAM OXALATE 20 MG PO TABS: 20.0000 mg | ORAL_TABLET | Freq: Every day | ORAL | 1 refills | Status: DC

## 2021-03-02 MED ORDER — LEFLUNOMIDE 10 MG PO TABS: 10.0000 mg | ORAL_TABLET | Freq: Every day | ORAL | 0 refills | Status: AC

## 2021-03-02 NOTE — Telephone Encounter (Signed)
Patient son Ysidro Evert calling in  Wants a cb from nurse (see previous message below)  [phone]

## 2021-03-02 NOTE — Progress Notes (Deleted)
Subjective:    Patient ID: Haley Roberts, female    DOB: Mar 01, 1945, 76 y.o.   MRN: [id number]  This visit occurred during the SARS-CoV-2 public health emergency.  Safety protocols were in place, including screening questions prior to the visit, additional usage of staff PPE, and extensive cleaning of exam room while observing appropriate contact time as indicated for disinfecting solutions.    HPI The patient is here for an acute visit.   Leg pain -    Medications and allergies reviewed with patient and updated if appropriate.  Patient Active Problem List   Diagnosis Date Noted   Situational anxiety 10/07/2020   Post-operative pain    Acute blood loss anemia    Right leg pain    Hyponatremia    Status post lumbar surgery 09/17/2020   Spondylolisthesis of lumbar region 09/16/2020   Intractable low back pain 09/09/2020   Difficulty urinating 11/18/2019   Hesitancy of micturition 11/18/2019   Bilateral leg edema 05/03/2018   Prediabetes 12/27/2017   Fibromyalgia 05/28/2015   S/P left TKA 07/07/2014   S/P knee replacement 07/07/2014   Right knee DJD 08/29/2010   Rheumatoid arthritis ([place]) 03/30/2010   Osteoarthritis 01/12/2009   Non-toxic nodular goiter 08/22/2007   Essential hypertension 08/22/2007   ANEMIA-NOS 05/31/2006   Depression 05/31/2006   ABNORMAL THYROID FUNCTION TESTS 05/31/2006    Current Outpatient Medications on File Prior to Visit  Medication Sig Dispense Refill   apixaban (ELIQUIS) 2.5 MG TABS tablet Take 1 tablet (2.5 mg total) by mouth 2 (two) times daily. 60 tablet 0   bethanechol (URECHOLINE) 10 MG tablet Take 1 tablet (10 mg total) by mouth 4 (four) times daily. 120 tablet 0   cyclobenzaprine (FLEXERIL) 10 MG tablet Take 1 tablet (10 mg total) by mouth 3 (three) times daily as needed for muscle spasms. 90 tablet 0   escitalopram (LEXAPRO) 20 MG tablet Take 1 tablet (20 mg total) by mouth daily. 30 tablet 1   leflunomide (ARAVA) 10 MG tablet  Take 1 tablet (10 mg total) by mouth daily. 30 tablet 0   losartan (COZAAR) 25 MG tablet Take 1 tablet (25 mg total) by mouth daily. 30 tablet 0   mirtazapine (REMERON) 15 MG tablet Take 1 tablet (15 mg total) by mouth at bedtime. 30 tablet 0   Multiple Vitamins-Minerals (WOMENS DAILY FORMULA) TABS Take 1 tablet by mouth daily.       oxyCODONE-acetaminophen (PERCOCET) 10-325 MG tablet Take 1 tablet by mouth every 6 (six) hours as needed for pain. 15 tablet 0   potassium chloride (KLOR-CON 10) 10 MEQ tablet Take 1 tablet (10 mEq total) by mouth daily. 30 tablet 0   predniSONE (DELTASONE) 5 MG tablet Take 1 tablet (5 mg total) by mouth daily. 30 tablet 0   pregabalin (LYRICA) 75 MG capsule Take 1 capsule (75 mg total) by mouth at bedtime. 30 capsule 3   vitamin C (ASCORBIC ACID) 500 MG tablet Take 500 mg by mouth daily.       vitamin E 180 MG (400 UNITS) capsule take 1 capsule by oral route every day     vitamin E 400 UNIT capsule Take 400 Units by mouth daily.       No current facility-administered medications on file prior to visit.    Past Medical History:  Diagnosis Date   Anemia    Anxiety    Depression    Fibromyalgia    Hyperglycemia    HYPERGLYCEMIA, FASTING  08/22/2007   Hypertension    Multiple thyroid nodules    gets ultra sound yearly on thryoid   Rheumatoid arthritis(714.0)     Past Surgical History:  Procedure Laterality Date   ABDOMINAL HYSTERECTOMY     with BSO   BREAST BIOPSY     JOINT REPLACEMENT     right knee replacement 2012   TOTAL KNEE ARTHROPLASTY Left 07/07/2014   Procedure: LEFT TOTAL KNEE ARTHROPLASTY;  Surgeon: Paralee Cancel, MD;  Location: [facility];  Service: Orthopedics;  Laterality: Left;    Social History   Socioeconomic History   Marital status: Married    Spouse name: Not on file   Number of children: Not on file   Years of education: Not on file   Highest education level: Not on file  Occupational History   Not on file  Tobacco Use    Smoking status: Never   Smokeless tobacco: Never  Vaping Use   Vaping Use: Never used  Substance and Sexual Activity   Alcohol use: No   Drug use: No   Sexual activity: Not Currently  Other Topics Concern   Not on file  Social History Narrative   Reg exercise         Social Determinants of Health   Financial Resource Strain: Not on file  Food Insecurity: Not on file  Transportation Needs: Not on file  Physical Activity: Not on file  Stress: Not on file  Social Connections: Not on file    Family History  Problem Relation Age of Onset   Cancer Mother 49       breast   Arthritis Father    Hypertension Father    Diabetes Sister    Hypertension Brother    Diabetes Son    Depression Son     Review of Systems     Objective:  There were no vitals filed for this visit. BP Readings from Last 3 Encounters:  02/16/21 (!) 158/88  12/08/20 140/68  10/25/20 102/72   Wt Readings from Last 3 Encounters:  12/08/20 145 lb 12.8 oz (66.1 kg)  09/24/20 145 lb 8.1 oz (66 kg)  09/10/20 164 lb 0.4 oz (74.4 kg)   There is no height or weight on file to calculate BMI.   Physical Exam         Assessment & Plan:    See Problem List for Assessment and Plan of chronic medical problems.

## 2021-03-02 NOTE — Telephone Encounter (Signed)
Please call son back.  I did send in the medications.  We do not have an accurate medication list.  She needs a follow-up appointment soon as possible.  Most of these medications I did not prescribe-I did send in a 1 month supply and she needs to follow-up with her specialists for additional refills.

## 2021-03-03 ENCOUNTER — Ambulatory Visit: Payer: Medicare Other | Admitting: Internal Medicine

## 2021-03-03 ENCOUNTER — Telehealth: Payer: Self-pay | Admitting: Internal Medicine

## 2021-03-03 DIAGNOSIS — M21371 Foot drop, right foot: Secondary | ICD-10-CM | POA: Diagnosis not present

## 2021-03-03 DIAGNOSIS — M4316 Spondylolisthesis, lumbar region: Secondary | ICD-10-CM | POA: Diagnosis not present

## 2021-03-03 DIAGNOSIS — I10 Essential (primary) hypertension: Secondary | ICD-10-CM | POA: Diagnosis not present

## 2021-03-03 DIAGNOSIS — G629 Polyneuropathy, unspecified: Secondary | ICD-10-CM | POA: Diagnosis not present

## 2021-03-03 DIAGNOSIS — M48061 Spinal stenosis, lumbar region without neurogenic claudication: Secondary | ICD-10-CM | POA: Diagnosis not present

## 2021-03-03 DIAGNOSIS — L89152 Pressure ulcer of sacral region, stage 2: Secondary | ICD-10-CM | POA: Diagnosis not present

## 2021-03-03 NOTE — Telephone Encounter (Signed)
Connected to [facility] 2.12.2023.  Caller states his mother need refills on medications

## 2021-03-03 NOTE — Telephone Encounter (Signed)
Message left for son today. He will need to bring mom in for follow up appointment at some point to reconcile her meds. Message left also about 30 day supply being sent in and follow up being made with specialists to continue refills.

## 2021-03-04 DIAGNOSIS — L89152 Pressure ulcer of sacral region, stage 2: Secondary | ICD-10-CM | POA: Diagnosis not present

## 2021-03-04 DIAGNOSIS — M21371 Foot drop, right foot: Secondary | ICD-10-CM | POA: Diagnosis not present

## 2021-03-04 DIAGNOSIS — I10 Essential (primary) hypertension: Secondary | ICD-10-CM | POA: Diagnosis not present

## 2021-03-04 DIAGNOSIS — M4316 Spondylolisthesis, lumbar region: Secondary | ICD-10-CM | POA: Diagnosis not present

## 2021-03-04 DIAGNOSIS — G629 Polyneuropathy, unspecified: Secondary | ICD-10-CM | POA: Diagnosis not present

## 2021-03-04 DIAGNOSIS — M48061 Spinal stenosis, lumbar region without neurogenic claudication: Secondary | ICD-10-CM | POA: Diagnosis not present

## 2021-03-07 DIAGNOSIS — R7303 Prediabetes: Secondary | ICD-10-CM | POA: Diagnosis not present

## 2021-03-07 DIAGNOSIS — F419 Anxiety disorder, unspecified: Secondary | ICD-10-CM | POA: Diagnosis not present

## 2021-03-07 DIAGNOSIS — G629 Polyneuropathy, unspecified: Secondary | ICD-10-CM | POA: Diagnosis not present

## 2021-03-07 DIAGNOSIS — M48061 Spinal stenosis, lumbar region without neurogenic claudication: Secondary | ICD-10-CM | POA: Diagnosis not present

## 2021-03-07 DIAGNOSIS — Z79891 Long term (current) use of opiate analgesic: Secondary | ICD-10-CM | POA: Diagnosis not present

## 2021-03-07 DIAGNOSIS — I10 Essential (primary) hypertension: Secondary | ICD-10-CM | POA: Diagnosis not present

## 2021-03-07 DIAGNOSIS — L89152 Pressure ulcer of sacral region, stage 2: Secondary | ICD-10-CM | POA: Diagnosis not present

## 2021-03-07 DIAGNOSIS — F32A Depression, unspecified: Secondary | ICD-10-CM | POA: Diagnosis not present

## 2021-03-07 DIAGNOSIS — Z7952 Long term (current) use of systemic steroids: Secondary | ICD-10-CM | POA: Diagnosis not present

## 2021-03-07 DIAGNOSIS — M21371 Foot drop, right foot: Secondary | ICD-10-CM | POA: Diagnosis not present

## 2021-03-07 DIAGNOSIS — M629 Disorder of muscle, unspecified: Secondary | ICD-10-CM | POA: Diagnosis not present

## 2021-03-07 DIAGNOSIS — K5900 Constipation, unspecified: Secondary | ICD-10-CM | POA: Diagnosis not present

## 2021-03-07 DIAGNOSIS — Z9181 History of falling: Secondary | ICD-10-CM | POA: Diagnosis not present

## 2021-03-07 DIAGNOSIS — M4316 Spondylolisthesis, lumbar region: Secondary | ICD-10-CM | POA: Diagnosis not present

## 2021-03-07 DIAGNOSIS — M1990 Unspecified osteoarthritis, unspecified site: Secondary | ICD-10-CM | POA: Diagnosis not present

## 2021-03-07 DIAGNOSIS — Z7901 Long term (current) use of anticoagulants: Secondary | ICD-10-CM | POA: Diagnosis not present

## 2021-03-07 DIAGNOSIS — R600 Localized edema: Secondary | ICD-10-CM | POA: Diagnosis not present

## 2021-03-07 DIAGNOSIS — M069 Rheumatoid arthritis, unspecified: Secondary | ICD-10-CM | POA: Diagnosis not present

## 2021-03-07 DIAGNOSIS — M797 Fibromyalgia: Secondary | ICD-10-CM | POA: Diagnosis not present

## 2021-03-07 DIAGNOSIS — D649 Anemia, unspecified: Secondary | ICD-10-CM | POA: Diagnosis not present

## 2021-03-09 DIAGNOSIS — M4316 Spondylolisthesis, lumbar region: Secondary | ICD-10-CM | POA: Diagnosis not present

## 2021-03-09 DIAGNOSIS — M21371 Foot drop, right foot: Secondary | ICD-10-CM | POA: Diagnosis not present

## 2021-03-09 DIAGNOSIS — L89152 Pressure ulcer of sacral region, stage 2: Secondary | ICD-10-CM | POA: Diagnosis not present

## 2021-03-09 DIAGNOSIS — I10 Essential (primary) hypertension: Secondary | ICD-10-CM | POA: Diagnosis not present

## 2021-03-09 DIAGNOSIS — M48061 Spinal stenosis, lumbar region without neurogenic claudication: Secondary | ICD-10-CM | POA: Diagnosis not present

## 2021-03-09 DIAGNOSIS — G629 Polyneuropathy, unspecified: Secondary | ICD-10-CM | POA: Diagnosis not present

## 2021-03-10 ENCOUNTER — Ambulatory Visit: Payer: Medicare Other | Admitting: Neurology

## 2021-03-11 DIAGNOSIS — M48061 Spinal stenosis, lumbar region without neurogenic claudication: Secondary | ICD-10-CM | POA: Diagnosis not present

## 2021-03-11 DIAGNOSIS — G629 Polyneuropathy, unspecified: Secondary | ICD-10-CM | POA: Diagnosis not present

## 2021-03-11 DIAGNOSIS — M4316 Spondylolisthesis, lumbar region: Secondary | ICD-10-CM | POA: Diagnosis not present

## 2021-03-11 DIAGNOSIS — I10 Essential (primary) hypertension: Secondary | ICD-10-CM | POA: Diagnosis not present

## 2021-03-11 DIAGNOSIS — M21371 Foot drop, right foot: Secondary | ICD-10-CM | POA: Diagnosis not present

## 2021-03-11 DIAGNOSIS — L89152 Pressure ulcer of sacral region, stage 2: Secondary | ICD-10-CM | POA: Diagnosis not present

## 2021-03-14 ENCOUNTER — Encounter: Payer: Self-pay | Admitting: Internal Medicine

## 2021-03-14 DIAGNOSIS — M21371 Foot drop, right foot: Secondary | ICD-10-CM | POA: Diagnosis not present

## 2021-03-14 DIAGNOSIS — G629 Polyneuropathy, unspecified: Secondary | ICD-10-CM | POA: Diagnosis not present

## 2021-03-14 DIAGNOSIS — M4316 Spondylolisthesis, lumbar region: Secondary | ICD-10-CM | POA: Diagnosis not present

## 2021-03-14 DIAGNOSIS — M48061 Spinal stenosis, lumbar region without neurogenic claudication: Secondary | ICD-10-CM | POA: Diagnosis not present

## 2021-03-14 DIAGNOSIS — I10 Essential (primary) hypertension: Secondary | ICD-10-CM | POA: Diagnosis not present

## 2021-03-14 DIAGNOSIS — L89152 Pressure ulcer of sacral region, stage 2: Secondary | ICD-10-CM | POA: Diagnosis not present

## 2021-03-14 NOTE — Progress Notes (Unsigned)
Subjective:    Patient ID: Haley Roberts, female    DOB: 03-Oct-1945, 76 y.o.   MRN: [id number]  This visit occurred during the SARS-CoV-2 public health emergency.  Safety protocols were in place, including screening questions prior to the visit, additional usage of staff PPE, and extensive cleaning of exam room while observing appropriate contact time as indicated for disinfecting solutions.    HPI The patient is here for a follow up visit of her chronic medical problems.   She had a fall at home 08/2020 - periprosthetic fracute of R medial femoral condyle and R foot avulsion fx.  08/2020: intractable back pain - severe spinal stenosis at L3/4 and L4/5 and had decompressive surgery 8/31.    Chronic medical problems include RA, htn, depression, prediabetes   Medications and allergies reviewed with patient and updated if appropriate.  Patient Active Problem List   Diagnosis Date Noted   Situational anxiety 10/07/2020   Post-operative pain    Acute blood loss anemia    Right leg pain    Hyponatremia    Status post lumbar surgery 09/17/2020   Spondylolisthesis of lumbar region 09/16/2020   Intractable low back pain 09/09/2020   Difficulty urinating 11/18/2019   Hesitancy of micturition 11/18/2019   Bilateral leg edema 05/03/2018   Prediabetes 12/27/2017   Fibromyalgia 05/28/2015   S/P left TKA 07/07/2014   S/P knee replacement 07/07/2014   Right knee DJD 08/29/2010   Rheumatoid arthritis ([place]) 03/30/2010   Osteoarthritis 01/12/2009   Non-toxic nodular goiter 08/22/2007   Essential hypertension 08/22/2007   ANEMIA-NOS 05/31/2006   Depression 05/31/2006   ABNORMAL THYROID FUNCTION TESTS 05/31/2006    Current Outpatient Medications on File Prior to Visit  Medication Sig Dispense Refill   apixaban (ELIQUIS) 2.5 MG TABS tablet Take 1 tablet (2.5 mg total) by mouth 2 (two) times daily. 60 tablet 0   bethanechol (URECHOLINE) 10 MG tablet Take 1 tablet (10 mg total) by  mouth 4 (four) times daily. 120 tablet 0   cyclobenzaprine (FLEXERIL) 10 MG tablet Take 1 tablet (10 mg total) by mouth 3 (three) times daily as needed for muscle spasms. 90 tablet 0   escitalopram (LEXAPRO) 20 MG tablet Take 1 tablet (20 mg total) by mouth daily. 30 tablet 1   leflunomide (ARAVA) 10 MG tablet Take 1 tablet (10 mg total) by mouth daily. 30 tablet 0   losartan (COZAAR) 25 MG tablet Take 1 tablet (25 mg total) by mouth daily. 30 tablet 0   mirtazapine (REMERON) 15 MG tablet Take 1 tablet (15 mg total) by mouth at bedtime. 30 tablet 0   Multiple Vitamins-Minerals (WOMENS DAILY FORMULA) TABS Take 1 tablet by mouth daily.       oxyCODONE-acetaminophen (PERCOCET) 10-325 MG tablet Take 1 tablet by mouth every 6 (six) hours as needed for pain. 15 tablet 0   potassium chloride (KLOR-CON 10) 10 MEQ tablet Take 1 tablet (10 mEq total) by mouth daily. 30 tablet 0   predniSONE (DELTASONE) 5 MG tablet Take 1 tablet (5 mg total) by mouth daily. 30 tablet 0   pregabalin (LYRICA) 75 MG capsule Take 1 capsule (75 mg total) by mouth at bedtime. 30 capsule 3   vitamin C (ASCORBIC ACID) 500 MG tablet Take 500 mg by mouth daily.       vitamin E 180 MG (400 UNITS) capsule take 1 capsule by oral route every day     vitamin E 400 UNIT capsule Take 400 Units  by mouth daily.       No current facility-administered medications on file prior to visit.    Past Medical History:  Diagnosis Date   Anemia    Anxiety    Depression    Fibromyalgia    Hyperglycemia    HYPERGLYCEMIA, FASTING 08/22/2007   Hypertension    Multiple thyroid nodules    gets ultra sound yearly on thryoid   Rheumatoid arthritis(714.0)     Past Surgical History:  Procedure Laterality Date   ABDOMINAL HYSTERECTOMY     with BSO   BREAST BIOPSY     JOINT REPLACEMENT     right knee replacement 2012   TOTAL KNEE ARTHROPLASTY Left 07/07/2014   Procedure: LEFT TOTAL KNEE ARTHROPLASTY;  Surgeon: Paralee Cancel, MD;  Location: [facility];   Service: Orthopedics;  Laterality: Left;    Social History   Socioeconomic History   Marital status: Married    Spouse name: Not on file   Number of children: Not on file   Years of education: Not on file   Highest education level: Not on file  Occupational History   Not on file  Tobacco Use   Smoking status: Never   Smokeless tobacco: Never  Vaping Use   Vaping Use: Never used  Substance and Sexual Activity   Alcohol use: No   Drug use: No   Sexual activity: Not Currently  Other Topics Concern   Not on file  Social History Narrative   Reg exercise         Social Determinants of Health   Financial Resource Strain: Not on file  Food Insecurity: Not on file  Transportation Needs: Not on file  Physical Activity: Not on file  Stress: Not on file  Social Connections: Not on file    Family History  Problem Relation Age of Onset   Cancer Mother 31       breast   Arthritis Father    Hypertension Father    Diabetes Sister    Hypertension Brother    Diabetes Son    Depression Son     Review of Systems     Objective:  There were no vitals filed for this visit. BP Readings from Last 3 Encounters:  02/16/21 (!) 158/88  12/08/20 140/68  10/25/20 102/72   Wt Readings from Last 3 Encounters:  12/08/20 145 lb 12.8 oz (66.1 kg)  09/24/20 145 lb 8.1 oz (66 kg)  09/10/20 164 lb 0.4 oz (74.4 kg)   There is no height or weight on file to calculate BMI.   Physical Exam         Assessment & Plan:    See Problem List for Assessment and Plan of chronic medical problems.

## 2021-03-15 ENCOUNTER — Ambulatory Visit: Payer: Medicare Other | Admitting: Internal Medicine

## 2021-03-15 DIAGNOSIS — R7303 Prediabetes: Secondary | ICD-10-CM

## 2021-03-15 DIAGNOSIS — F3289 Other specified depressive episodes: Secondary | ICD-10-CM

## 2021-03-15 DIAGNOSIS — I10 Essential (primary) hypertension: Secondary | ICD-10-CM

## 2021-03-15 DIAGNOSIS — M069 Rheumatoid arthritis, unspecified: Secondary | ICD-10-CM

## 2021-03-16 DIAGNOSIS — M4316 Spondylolisthesis, lumbar region: Secondary | ICD-10-CM | POA: Diagnosis not present

## 2021-03-16 DIAGNOSIS — M48061 Spinal stenosis, lumbar region without neurogenic claudication: Secondary | ICD-10-CM | POA: Diagnosis not present

## 2021-03-16 DIAGNOSIS — G629 Polyneuropathy, unspecified: Secondary | ICD-10-CM | POA: Diagnosis not present

## 2021-03-16 DIAGNOSIS — L89152 Pressure ulcer of sacral region, stage 2: Secondary | ICD-10-CM | POA: Diagnosis not present

## 2021-03-16 DIAGNOSIS — M21371 Foot drop, right foot: Secondary | ICD-10-CM | POA: Diagnosis not present

## 2021-03-16 DIAGNOSIS — I10 Essential (primary) hypertension: Secondary | ICD-10-CM | POA: Diagnosis not present

## 2021-03-18 DIAGNOSIS — I10 Essential (primary) hypertension: Secondary | ICD-10-CM | POA: Diagnosis not present

## 2021-03-18 DIAGNOSIS — M21371 Foot drop, right foot: Secondary | ICD-10-CM | POA: Diagnosis not present

## 2021-03-18 DIAGNOSIS — G629 Polyneuropathy, unspecified: Secondary | ICD-10-CM | POA: Diagnosis not present

## 2021-03-18 DIAGNOSIS — L89152 Pressure ulcer of sacral region, stage 2: Secondary | ICD-10-CM | POA: Diagnosis not present

## 2021-03-18 DIAGNOSIS — M48061 Spinal stenosis, lumbar region without neurogenic claudication: Secondary | ICD-10-CM | POA: Diagnosis not present

## 2021-03-18 DIAGNOSIS — M4316 Spondylolisthesis, lumbar region: Secondary | ICD-10-CM | POA: Diagnosis not present

## 2021-03-21 DIAGNOSIS — L89152 Pressure ulcer of sacral region, stage 2: Secondary | ICD-10-CM | POA: Diagnosis not present

## 2021-03-21 DIAGNOSIS — M48061 Spinal stenosis, lumbar region without neurogenic claudication: Secondary | ICD-10-CM | POA: Diagnosis not present

## 2021-03-21 DIAGNOSIS — M21371 Foot drop, right foot: Secondary | ICD-10-CM | POA: Diagnosis not present

## 2021-03-21 DIAGNOSIS — M4316 Spondylolisthesis, lumbar region: Secondary | ICD-10-CM | POA: Diagnosis not present

## 2021-03-21 DIAGNOSIS — I10 Essential (primary) hypertension: Secondary | ICD-10-CM | POA: Diagnosis not present

## 2021-03-21 DIAGNOSIS — G629 Polyneuropathy, unspecified: Secondary | ICD-10-CM | POA: Diagnosis not present

## 2021-03-22 ENCOUNTER — Encounter: Payer: Self-pay | Admitting: Internal Medicine

## 2021-03-22 NOTE — Progress Notes (Deleted)
? ? ?Subjective:  ? ? Patient ID: Haley Roberts, female    DOB: 07-11-1945, 76 y.o.   MRN: [id number] ? ?This visit occurred during the SARS-CoV-2 public health emergency.  Safety protocols were in place, including screening questions prior to the visit, additional usage of staff PPE, and extensive cleaning of exam room while observing appropriate contact time as indicated for disinfecting solutions. ? ? ? ?HPI ?The patient is here for a follow up visit of her chronic medical problems.  ? ?She had a fall at home 08/2020 - periprosthetic fracture of R medial femoral condyle and R foot avulsion fx. ? ?08/2020: intractable back pain - severe spinal stenosis at L3/4 and L4/5 and had decompressive surgery 8/31.   ? ?Chronic medical problems include RA, htn, depression, prediabetes ? ? ?Medications and allergies reviewed with patient and updated if appropriate. ? ?Patient Active Problem List  ? Diagnosis Date Noted  ? Situational anxiety 10/07/2020  ? Post-operative pain   ? Acute blood loss anemia   ? Right leg pain   ? Hyponatremia   ? Status post lumbar surgery 09/17/2020  ? Spondylolisthesis of lumbar region 09/16/2020  ? Intractable low back pain 09/09/2020  ? Difficulty urinating 11/18/2019  ? Hesitancy of micturition 11/18/2019  ? Bilateral leg edema 05/03/2018  ? Prediabetes 12/27/2017  ? Fibromyalgia 05/28/2015  ? S/P left TKA 07/07/2014  ? S/P knee replacement 07/07/2014  ? Right knee DJD 08/29/2010  ? Rheumatoid arthritis ([place]) 03/30/2010  ? Osteoarthritis 01/12/2009  ? Non-toxic nodular goiter 08/22/2007  ? Essential hypertension 08/22/2007  ? ANEMIA-NOS 05/31/2006  ? Depression 05/31/2006  ? ABNORMAL THYROID FUNCTION TESTS 05/31/2006  ? ? ?Current Outpatient Medications on File Prior to Visit  ?Medication Sig Dispense Refill  ? apixaban (ELIQUIS) 2.5 MG TABS tablet Take 1 tablet (2.5 mg total) by mouth 2 (two) times daily. 60 tablet 0  ? bethanechol (URECHOLINE) 10 MG tablet Take 1 tablet (10 mg total) by  mouth 4 (four) times daily. 120 tablet 0  ? cyclobenzaprine (FLEXERIL) 10 MG tablet Take 1 tablet (10 mg total) by mouth 3 (three) times daily as needed for muscle spasms. 90 tablet 0  ? escitalopram (LEXAPRO) 20 MG tablet Take 1 tablet (20 mg total) by mouth daily. 30 tablet 1  ? leflunomide (ARAVA) 10 MG tablet Take 1 tablet (10 mg total) by mouth daily. 30 tablet 0  ? losartan (COZAAR) 25 MG tablet Take 1 tablet (25 mg total) by mouth daily. 30 tablet 0  ? mirtazapine (REMERON) 15 MG tablet Take 1 tablet (15 mg total) by mouth at bedtime. 30 tablet 0  ? Multiple Vitamins-Minerals (WOMENS DAILY FORMULA) TABS Take 1 tablet by mouth daily.      ? oxyCODONE-acetaminophen (PERCOCET) 10-325 MG tablet Take 1 tablet by mouth every 6 (six) hours as needed for pain. 15 tablet 0  ? potassium chloride (KLOR-CON 10) 10 MEQ tablet Take 1 tablet (10 mEq total) by mouth daily. 30 tablet 0  ? predniSONE (DELTASONE) 5 MG tablet Take 1 tablet (5 mg total) by mouth daily. 30 tablet 0  ? pregabalin (LYRICA) 75 MG capsule Take 1 capsule (75 mg total) by mouth at bedtime. 30 capsule 3  ? vitamin C (ASCORBIC ACID) 500 MG tablet Take 500 mg by mouth daily.      ? vitamin E 180 MG (400 UNITS) capsule take 1 capsule by oral route every day    ? vitamin E 400 UNIT capsule Take 400 Units  by mouth daily.      ? ?No current facility-administered medications on file prior to visit.  ? ? ?Past Medical History:  ?Diagnosis Date  ? Anemia   ? Anxiety   ? Depression   ? Fibromyalgia   ? Hyperglycemia   ? HYPERGLYCEMIA, FASTING 08/22/2007  ? Hypertension   ? Multiple thyroid nodules   ? gets ultra sound yearly on thryoid  ? Rheumatoid arthritis(714.0)   ? ? ?Past Surgical History:  ?Procedure Laterality Date  ? ABDOMINAL HYSTERECTOMY    ? with BSO  ? BREAST BIOPSY    ? JOINT REPLACEMENT    ? right knee replacement 2012  ? TOTAL KNEE ARTHROPLASTY Left 07/07/2014  ? Procedure: LEFT TOTAL KNEE ARTHROPLASTY;  Surgeon: Paralee Cancel, MD;  Location: [facility];   Service: Orthopedics;  Laterality: Left;  ? ? ?Social History  ? ?Socioeconomic History  ? Marital status: Married  ?  Spouse name: Not on file  ? Number of children: Not on file  ? Years of education: Not on file  ? Highest education level: Not on file  ?Occupational History  ? Not on file  ?Tobacco Use  ? Smoking status: Never  ? Smokeless tobacco: Never  ?Vaping Use  ? Vaping Use: Never used  ?Substance and Sexual Activity  ? Alcohol use: No  ? Drug use: No  ? Sexual activity: Not Currently  ?Other Topics Concern  ? Not on file  ?Social History Narrative  ? Reg exercise  ?   ?   ? ?Social Determinants of Health  ? ?Financial Resource Strain: Not on file  ?Food Insecurity: Not on file  ?Transportation Needs: Not on file  ?Physical Activity: Not on file  ?Stress: Not on file  ?Social Connections: Not on file  ? ? ?Family History  ?Problem Relation Age of Onset  ? Cancer Mother 79  ?     breast  ? Arthritis Father   ? Hypertension Father   ? Diabetes Sister   ? Hypertension Brother   ? Diabetes Son   ? Depression Son   ? ? ?Review of Systems ? ?   ?Objective:  ?There were no vitals filed for this visit. ?BP Readings from Last 3 Encounters:  ?02/16/21 (!) 158/88  ?12/08/20 140/68  ?10/25/20 102/72  ? ?Wt Readings from Last 3 Encounters:  ?12/08/20 145 lb 12.8 oz (66.1 kg)  ?09/24/20 145 lb 8.1 oz (66 kg)  ?09/10/20 164 lb 0.4 oz (74.4 kg)  ? ?There is no height or weight on file to calculate BMI. ? ? Physical Exam ?   ? ? ? ? ? ?Assessment & Plan:  ? ? ?See Problem List for Assessment and Plan of chronic medical problems.  ? ? ? ? ? ?

## 2021-03-23 ENCOUNTER — Ambulatory Visit: Payer: Medicare Other | Admitting: Internal Medicine

## 2021-03-25 DIAGNOSIS — M5416 Radiculopathy, lumbar region: Secondary | ICD-10-CM | POA: Diagnosis not present

## 2021-03-28 ENCOUNTER — Encounter: Payer: Self-pay | Admitting: Internal Medicine

## 2021-03-28 NOTE — Progress Notes (Deleted)
? ? ?Subjective:  ? ? Patient ID: Haley Roberts, female    DOB: 1945/02/17, 76 y.o.   MRN: [id number] ? ?This visit occurred during the SARS-CoV-2 public health emergency.  Safety protocols were in place, including screening questions prior to the visit, additional usage of staff PPE, and extensive cleaning of exam room while observing appropriate contact time as indicated for disinfecting solutions. ? ? ? ?HPI ?The patient is here for a follow up visit of her chronic medical problems.  ? ?She had a fall at home 08/2020 - periprosthetic fracture of R medial femoral condyle and R foot avulsion fx. ? ?08/2020: intractable back pain - severe spinal stenosis at L3/4 and L4/5 and had decompressive surgery 8/31.  Woke up unable to move right leg.  Also as left leg weakness.  She LLE numbness into the groin.  Has lack of sensation of bowel/bladder, but has some control.  Has done extensive PT.  Can walk with walker, but it is difficult. She has regained some strength in her RLE.   ? ?Chronic medical problems include RA, htn, depression, prediabetes ? ? ? ? ?Medications and allergies reviewed with patient and updated if appropriate. ? ?Patient Active Problem List  ? Diagnosis Date Noted  ? Situational anxiety 10/07/2020  ? Post-operative pain   ? Acute blood loss anemia   ? Right leg pain   ? Hyponatremia   ? Status post lumbar surgery 09/17/2020  ? Spondylolisthesis of lumbar region 09/16/2020  ? Intractable low back pain 09/09/2020  ? Difficulty urinating 11/18/2019  ? Hesitancy of micturition 11/18/2019  ? Bilateral leg edema 05/03/2018  ? Prediabetes 12/27/2017  ? Fibromyalgia 05/28/2015  ? S/P left TKA 07/07/2014  ? S/P knee replacement 07/07/2014  ? Right knee DJD 08/29/2010  ? Rheumatoid arthritis ([place]) 03/30/2010  ? Osteoarthritis 01/12/2009  ? Non-toxic nodular goiter 08/22/2007  ? Essential hypertension 08/22/2007  ? ANEMIA-NOS 05/31/2006  ? Depression 05/31/2006  ? ABNORMAL THYROID FUNCTION TESTS 05/31/2006   ? ? ?Current Outpatient Medications on File Prior to Visit  ?Medication Sig Dispense Refill  ? apixaban (ELIQUIS) 2.5 MG TABS tablet Take 1 tablet (2.5 mg total) by mouth 2 (two) times daily. 60 tablet 0  ? bethanechol (URECHOLINE) 10 MG tablet Take 1 tablet (10 mg total) by mouth 4 (four) times daily. 120 tablet 0  ? cyclobenzaprine (FLEXERIL) 10 MG tablet Take 1 tablet (10 mg total) by mouth 3 (three) times daily as needed for muscle spasms. 90 tablet 0  ? escitalopram (LEXAPRO) 20 MG tablet Take 1 tablet (20 mg total) by mouth daily. 30 tablet 1  ? leflunomide (ARAVA) 10 MG tablet Take 1 tablet (10 mg total) by mouth daily. 30 tablet 0  ? losartan (COZAAR) 25 MG tablet Take 1 tablet (25 mg total) by mouth daily. 30 tablet 0  ? mirtazapine (REMERON) 15 MG tablet Take 1 tablet (15 mg total) by mouth at bedtime. 30 tablet 0  ? Multiple Vitamins-Minerals (WOMENS DAILY FORMULA) TABS Take 1 tablet by mouth daily.      ? oxyCODONE-acetaminophen (PERCOCET) 10-325 MG tablet Take 1 tablet by mouth every 6 (six) hours as needed for pain. 15 tablet 0  ? potassium chloride (KLOR-CON 10) 10 MEQ tablet Take 1 tablet (10 mEq total) by mouth daily. 30 tablet 0  ? predniSONE (DELTASONE) 5 MG tablet Take 1 tablet (5 mg total) by mouth daily. 30 tablet 0  ? pregabalin (LYRICA) 75 MG capsule Take 1 capsule (75 mg  total) by mouth at bedtime. 30 capsule 3  ? vitamin C (ASCORBIC ACID) 500 MG tablet Take 500 mg by mouth daily.      ? vitamin E 180 MG (400 UNITS) capsule take 1 capsule by oral route every day    ? vitamin E 400 UNIT capsule Take 400 Units by mouth daily.      ? ?No current facility-administered medications on file prior to visit.  ? ? ?Past Medical History:  ?Diagnosis Date  ? Anemia   ? Anxiety   ? Depression   ? Fibromyalgia   ? Hyperglycemia   ? HYPERGLYCEMIA, FASTING 08/22/2007  ? Hypertension   ? Multiple thyroid nodules   ? gets ultra sound yearly on thryoid  ? Rheumatoid arthritis(714.0)   ? ? ?Past Surgical  History:  ?Procedure Laterality Date  ? ABDOMINAL HYSTERECTOMY    ? with BSO  ? BREAST BIOPSY    ? JOINT REPLACEMENT    ? right knee replacement 2012  ? TOTAL KNEE ARTHROPLASTY Left 07/07/2014  ? Procedure: LEFT TOTAL KNEE ARTHROPLASTY;  Surgeon: Paralee Cancel, MD;  Location: [facility];  Service: Orthopedics;  Laterality: Left;  ? ? ?Social History  ? ?Socioeconomic History  ? Marital status: Married  ?  Spouse name: Not on file  ? Number of children: Not on file  ? Years of education: Not on file  ? Highest education level: Not on file  ?Occupational History  ? Not on file  ?Tobacco Use  ? Smoking status: Never  ? Smokeless tobacco: Never  ?Vaping Use  ? Vaping Use: Never used  ?Substance and Sexual Activity  ? Alcohol use: No  ? Drug use: No  ? Sexual activity: Not Currently  ?Other Topics Concern  ? Not on file  ?Social History Narrative  ? Reg exercise  ?   ?   ? ?Social Determinants of Health  ? ?Financial Resource Strain: Not on file  ?Food Insecurity: Not on file  ?Transportation Needs: Not on file  ?Physical Activity: Not on file  ?Stress: Not on file  ?Social Connections: Not on file  ? ? ?Family History  ?Problem Relation Age of Onset  ? Cancer Mother 12  ?     breast  ? Arthritis Father   ? Hypertension Father   ? Diabetes Sister   ? Hypertension Brother   ? Diabetes Son   ? Depression Son   ? ? ?Review of Systems ? ?   ?Objective:  ?There were no vitals filed for this visit. ?BP Readings from Last 3 Encounters:  ?02/16/21 (!) 158/88  ?12/08/20 140/68  ?10/25/20 102/72  ? ?Wt Readings from Last 3 Encounters:  ?12/08/20 145 lb 12.8 oz (66.1 kg)  ?09/24/20 145 lb 8.1 oz (66 kg)  ?09/10/20 164 lb 0.4 oz (74.4 kg)  ? ?There is no height or weight on file to calculate BMI. ? ? Physical Exam ?   ? ? ? ? ? ?Assessment & Plan:  ? ? ?See Problem List for Assessment and Plan of chronic medical problems.  ? ? ? ? ? ?

## 2021-03-29 ENCOUNTER — Ambulatory Visit: Payer: Medicare Other | Admitting: Internal Medicine

## 2021-03-29 DIAGNOSIS — F3289 Other specified depressive episodes: Secondary | ICD-10-CM

## 2021-03-29 DIAGNOSIS — I10 Essential (primary) hypertension: Secondary | ICD-10-CM

## 2021-03-29 DIAGNOSIS — R7303 Prediabetes: Secondary | ICD-10-CM

## 2021-03-30 DIAGNOSIS — G629 Polyneuropathy, unspecified: Secondary | ICD-10-CM | POA: Diagnosis not present

## 2021-03-30 DIAGNOSIS — L89152 Pressure ulcer of sacral region, stage 2: Secondary | ICD-10-CM | POA: Diagnosis not present

## 2021-03-30 DIAGNOSIS — M48061 Spinal stenosis, lumbar region without neurogenic claudication: Secondary | ICD-10-CM | POA: Diagnosis not present

## 2021-03-30 DIAGNOSIS — I10 Essential (primary) hypertension: Secondary | ICD-10-CM | POA: Diagnosis not present

## 2021-03-30 DIAGNOSIS — M4316 Spondylolisthesis, lumbar region: Secondary | ICD-10-CM | POA: Diagnosis not present

## 2021-03-30 DIAGNOSIS — M21371 Foot drop, right foot: Secondary | ICD-10-CM | POA: Diagnosis not present

## 2021-04-01 DIAGNOSIS — M4316 Spondylolisthesis, lumbar region: Secondary | ICD-10-CM | POA: Diagnosis not present

## 2021-04-01 DIAGNOSIS — I10 Essential (primary) hypertension: Secondary | ICD-10-CM | POA: Diagnosis not present

## 2021-04-01 DIAGNOSIS — L89152 Pressure ulcer of sacral region, stage 2: Secondary | ICD-10-CM | POA: Diagnosis not present

## 2021-04-01 DIAGNOSIS — G629 Polyneuropathy, unspecified: Secondary | ICD-10-CM | POA: Diagnosis not present

## 2021-04-01 DIAGNOSIS — M21371 Foot drop, right foot: Secondary | ICD-10-CM | POA: Diagnosis not present

## 2021-04-01 DIAGNOSIS — M48061 Spinal stenosis, lumbar region without neurogenic claudication: Secondary | ICD-10-CM | POA: Diagnosis not present

## 2021-04-04 DIAGNOSIS — G629 Polyneuropathy, unspecified: Secondary | ICD-10-CM | POA: Diagnosis not present

## 2021-04-04 DIAGNOSIS — L89152 Pressure ulcer of sacral region, stage 2: Secondary | ICD-10-CM | POA: Diagnosis not present

## 2021-04-04 DIAGNOSIS — M4316 Spondylolisthesis, lumbar region: Secondary | ICD-10-CM | POA: Diagnosis not present

## 2021-04-04 DIAGNOSIS — M48061 Spinal stenosis, lumbar region without neurogenic claudication: Secondary | ICD-10-CM | POA: Diagnosis not present

## 2021-04-04 DIAGNOSIS — M21371 Foot drop, right foot: Secondary | ICD-10-CM | POA: Diagnosis not present

## 2021-04-04 DIAGNOSIS — I10 Essential (primary) hypertension: Secondary | ICD-10-CM | POA: Diagnosis not present

## 2021-04-04 NOTE — Progress Notes (Deleted)
? ? ?Subjective:  ? ? Patient ID: Haley Roberts, female    DOB: September 29, 1945, 76 y.o.   MRN: [id number] ? ?This visit occurred during the SARS-CoV-2 public health emergency.  Safety protocols were in place, including screening questions prior to the visit, additional usage of staff PPE, and extensive cleaning of exam room while observing appropriate contact time as indicated for disinfecting solutions. ? ? ? ?HPI ?The patient is here for a follow up visit of her chronic medical problems.  ? ?She had a fall at home 08/2020 - periprosthetic fracture of R medial femoral condyle and R foot avulsion fx. ? ?08/2020: intractable back pain - severe spinal stenosis at L3/4 and L4/5 and had decompressive surgery 8/31.  Woke up unable to move right leg.  Also as left leg weakness.  She LLE numbness into the groin.  Has lack of sensation of bowel/bladder, but has some control.  Has done extensive PT.  Can walk with walker, but it is difficult. She has regained some strength in her RLE.   ? ?Chronic medical problems include RA, htn, depression, prediabetes ? ? ? ? ?Medications and allergies reviewed with patient and updated if appropriate. ? ?Patient Active Problem List  ? Diagnosis Date Noted  ? Situational anxiety 10/07/2020  ? Acute blood loss anemia   ? Right leg pain   ? Hyponatremia   ? Status post lumbar surgery 09/17/2020  ? Spondylolisthesis of lumbar region 09/16/2020  ? Intractable low back pain 09/09/2020  ? Difficulty urinating 11/18/2019  ? Hesitancy of micturition 11/18/2019  ? Bilateral leg edema 05/03/2018  ? Prediabetes 12/27/2017  ? Fibromyalgia 05/28/2015  ? S/P left TKA 07/07/2014  ? S/P knee replacement 07/07/2014  ? Right knee DJD 08/29/2010  ? Rheumatoid arthritis ([place]) 03/30/2010  ? Osteoarthritis 01/12/2009  ? Non-toxic nodular goiter 08/22/2007  ? Essential hypertension 08/22/2007  ? ANEMIA-NOS 05/31/2006  ? Depression 05/31/2006  ? ABNORMAL THYROID FUNCTION TESTS 05/31/2006  ? ? ?Current Outpatient  Medications on File Prior to Visit  ?Medication Sig Dispense Refill  ? apixaban (ELIQUIS) 2.5 MG TABS tablet Take 1 tablet (2.5 mg total) by mouth 2 (two) times daily. 60 tablet 0  ? bethanechol (URECHOLINE) 10 MG tablet Take 1 tablet (10 mg total) by mouth 4 (four) times daily. 120 tablet 0  ? cyclobenzaprine (FLEXERIL) 10 MG tablet Take 1 tablet (10 mg total) by mouth 3 (three) times daily as needed for muscle spasms. 90 tablet 0  ? escitalopram (LEXAPRO) 20 MG tablet Take 1 tablet (20 mg total) by mouth daily. 30 tablet 1  ? leflunomide (ARAVA) 10 MG tablet Take 1 tablet (10 mg total) by mouth daily. 30 tablet 0  ? losartan (COZAAR) 25 MG tablet Take 1 tablet (25 mg total) by mouth daily. 30 tablet 0  ? mirtazapine (REMERON) 15 MG tablet Take 1 tablet (15 mg total) by mouth at bedtime. 30 tablet 0  ? Multiple Vitamins-Minerals (WOMENS DAILY FORMULA) TABS Take 1 tablet by mouth daily.      ? potassium chloride (KLOR-CON 10) 10 MEQ tablet Take 1 tablet (10 mEq total) by mouth daily. 30 tablet 0  ? predniSONE (DELTASONE) 5 MG tablet Take 1 tablet (5 mg total) by mouth daily. 30 tablet 0  ? pregabalin (LYRICA) 75 MG capsule Take 1 capsule (75 mg total) by mouth at bedtime. 30 capsule 3  ? vitamin C (ASCORBIC ACID) 500 MG tablet Take 500 mg by mouth daily.      ?  vitamin E 180 MG (400 UNITS) capsule take 1 capsule by oral route every day    ? vitamin E 400 UNIT capsule Take 400 Units by mouth daily.      ? ?No current facility-administered medications on file prior to visit.  ? ? ?Past Medical History:  ?Diagnosis Date  ? Anemia   ? Anxiety   ? Depression   ? Fibromyalgia   ? Hyperglycemia   ? HYPERGLYCEMIA, FASTING 08/22/2007  ? Hypertension   ? Multiple thyroid nodules   ? gets ultra sound yearly on thryoid  ? Rheumatoid arthritis(714.0)   ? ? ?Past Surgical History:  ?Procedure Laterality Date  ? ABDOMINAL HYSTERECTOMY    ? with BSO  ? BREAST BIOPSY    ? JOINT REPLACEMENT    ? right knee replacement 2012  ? TOTAL  KNEE ARTHROPLASTY Left 07/07/2014  ? Procedure: LEFT TOTAL KNEE ARTHROPLASTY;  Surgeon: Paralee Cancel, MD;  Location: [facility];  Service: Orthopedics;  Laterality: Left;  ? ? ?Social History  ? ?Socioeconomic History  ? Marital status: Married  ?  Spouse name: Not on file  ? Number of children: Not on file  ? Years of education: Not on file  ? Highest education level: Not on file  ?Occupational History  ? Not on file  ?Tobacco Use  ? Smoking status: Never  ? Smokeless tobacco: Never  ?Vaping Use  ? Vaping Use: Never used  ?Substance and Sexual Activity  ? Alcohol use: No  ? Drug use: No  ? Sexual activity: Not Currently  ?Other Topics Concern  ? Not on file  ?Social History Narrative  ? Reg exercise  ?   ?   ? ?Social Determinants of Health  ? ?Financial Resource Strain: Not on file  ?Food Insecurity: Not on file  ?Transportation Needs: Not on file  ?Physical Activity: Not on file  ?Stress: Not on file  ?Social Connections: Not on file  ? ? ?Family History  ?Problem Relation Age of Onset  ? Cancer Mother 33  ?     breast  ? Arthritis Father   ? Hypertension Father   ? Diabetes Sister   ? Hypertension Brother   ? Diabetes Son   ? Depression Son   ? ? ?Review of Systems ? ?   ?Objective:  ?There were no vitals filed for this visit. ?BP Readings from Last 3 Encounters:  ?02/16/21 (!) 158/88  ?12/08/20 140/68  ?10/25/20 102/72  ? ?Wt Readings from Last 3 Encounters:  ?12/08/20 145 lb 12.8 oz (66.1 kg)  ?09/24/20 145 lb 8.1 oz (66 kg)  ?09/10/20 164 lb 0.4 oz (74.4 kg)  ? ?There is no height or weight on file to calculate BMI. ? ? Physical Exam ?   ? ? ? ? ? ?Assessment & Plan:  ? ? ?See Problem List for Assessment and Plan of chronic medical problems.  ? ? ? ? ? ?

## 2021-04-05 ENCOUNTER — Ambulatory Visit: Payer: Medicare Other | Admitting: Internal Medicine

## 2021-04-05 ENCOUNTER — Other Ambulatory Visit: Payer: Self-pay | Admitting: Internal Medicine

## 2021-04-05 ENCOUNTER — Telehealth: Payer: Self-pay | Admitting: Internal Medicine

## 2021-04-05 DIAGNOSIS — G629 Polyneuropathy, unspecified: Secondary | ICD-10-CM | POA: Diagnosis not present

## 2021-04-05 DIAGNOSIS — M21371 Foot drop, right foot: Secondary | ICD-10-CM | POA: Diagnosis not present

## 2021-04-05 DIAGNOSIS — I10 Essential (primary) hypertension: Secondary | ICD-10-CM | POA: Diagnosis not present

## 2021-04-05 DIAGNOSIS — L89152 Pressure ulcer of sacral region, stage 2: Secondary | ICD-10-CM

## 2021-04-05 DIAGNOSIS — M4316 Spondylolisthesis, lumbar region: Secondary | ICD-10-CM | POA: Diagnosis not present

## 2021-04-05 DIAGNOSIS — M48061 Spinal stenosis, lumbar region without neurogenic claudication: Secondary | ICD-10-CM | POA: Diagnosis not present

## 2021-04-05 NOTE — Telephone Encounter (Signed)
Haley Roberts calls today with [facility] regarding orders for PT. Haley Roberts would like an re-certification be put in for  ?nursing 1 week 9 ? ?PT's stage 2 ulster closed but Haley Roberts would like an order be put in for PT to apply exuderm ?2 times a week PRN ? ?CB: [phone] ? ?

## 2021-04-06 DIAGNOSIS — M629 Disorder of muscle, unspecified: Secondary | ICD-10-CM | POA: Diagnosis not present

## 2021-04-06 DIAGNOSIS — M1990 Unspecified osteoarthritis, unspecified site: Secondary | ICD-10-CM | POA: Diagnosis not present

## 2021-04-06 DIAGNOSIS — Z7952 Long term (current) use of systemic steroids: Secondary | ICD-10-CM | POA: Diagnosis not present

## 2021-04-06 DIAGNOSIS — K5900 Constipation, unspecified: Secondary | ICD-10-CM | POA: Diagnosis not present

## 2021-04-06 DIAGNOSIS — M797 Fibromyalgia: Secondary | ICD-10-CM | POA: Diagnosis not present

## 2021-04-06 DIAGNOSIS — Z9181 History of falling: Secondary | ICD-10-CM | POA: Diagnosis not present

## 2021-04-06 DIAGNOSIS — D649 Anemia, unspecified: Secondary | ICD-10-CM | POA: Diagnosis not present

## 2021-04-06 DIAGNOSIS — M4316 Spondylolisthesis, lumbar region: Secondary | ICD-10-CM | POA: Diagnosis not present

## 2021-04-06 DIAGNOSIS — Z79891 Long term (current) use of opiate analgesic: Secondary | ICD-10-CM | POA: Diagnosis not present

## 2021-04-06 DIAGNOSIS — M069 Rheumatoid arthritis, unspecified: Secondary | ICD-10-CM | POA: Diagnosis not present

## 2021-04-06 DIAGNOSIS — M48061 Spinal stenosis, lumbar region without neurogenic claudication: Secondary | ICD-10-CM | POA: Diagnosis not present

## 2021-04-06 DIAGNOSIS — F419 Anxiety disorder, unspecified: Secondary | ICD-10-CM | POA: Diagnosis not present

## 2021-04-06 DIAGNOSIS — R7303 Prediabetes: Secondary | ICD-10-CM | POA: Diagnosis not present

## 2021-04-06 DIAGNOSIS — R600 Localized edema: Secondary | ICD-10-CM | POA: Diagnosis not present

## 2021-04-06 DIAGNOSIS — F32A Depression, unspecified: Secondary | ICD-10-CM | POA: Diagnosis not present

## 2021-04-06 DIAGNOSIS — M21371 Foot drop, right foot: Secondary | ICD-10-CM | POA: Diagnosis not present

## 2021-04-06 DIAGNOSIS — Z7901 Long term (current) use of anticoagulants: Secondary | ICD-10-CM | POA: Diagnosis not present

## 2021-04-06 DIAGNOSIS — G629 Polyneuropathy, unspecified: Secondary | ICD-10-CM | POA: Diagnosis not present

## 2021-04-06 DIAGNOSIS — I10 Essential (primary) hypertension: Secondary | ICD-10-CM | POA: Diagnosis not present

## 2021-04-06 NOTE — Telephone Encounter (Signed)
Message left for Haley Roberts today that orders were placed but face to face has not been done in the past 90 days. ?

## 2021-04-06 NOTE — Telephone Encounter (Signed)
I have ordered home nursing, but they may not be able to do the order because I have not seen the patient since October.  She has an appointment next week if she shows. ?

## 2021-04-07 DIAGNOSIS — G629 Polyneuropathy, unspecified: Secondary | ICD-10-CM | POA: Diagnosis not present

## 2021-04-07 DIAGNOSIS — M4316 Spondylolisthesis, lumbar region: Secondary | ICD-10-CM | POA: Diagnosis not present

## 2021-04-07 DIAGNOSIS — M48061 Spinal stenosis, lumbar region without neurogenic claudication: Secondary | ICD-10-CM | POA: Diagnosis not present

## 2021-04-07 DIAGNOSIS — I10 Essential (primary) hypertension: Secondary | ICD-10-CM | POA: Diagnosis not present

## 2021-04-07 DIAGNOSIS — M069 Rheumatoid arthritis, unspecified: Secondary | ICD-10-CM | POA: Diagnosis not present

## 2021-04-07 DIAGNOSIS — M21371 Foot drop, right foot: Secondary | ICD-10-CM | POA: Diagnosis not present

## 2021-04-08 DIAGNOSIS — M48061 Spinal stenosis, lumbar region without neurogenic claudication: Secondary | ICD-10-CM | POA: Diagnosis not present

## 2021-04-08 DIAGNOSIS — M069 Rheumatoid arthritis, unspecified: Secondary | ICD-10-CM | POA: Diagnosis not present

## 2021-04-08 DIAGNOSIS — G629 Polyneuropathy, unspecified: Secondary | ICD-10-CM | POA: Diagnosis not present

## 2021-04-08 DIAGNOSIS — M4316 Spondylolisthesis, lumbar region: Secondary | ICD-10-CM | POA: Diagnosis not present

## 2021-04-08 DIAGNOSIS — M21371 Foot drop, right foot: Secondary | ICD-10-CM | POA: Diagnosis not present

## 2021-04-08 DIAGNOSIS — I10 Essential (primary) hypertension: Secondary | ICD-10-CM | POA: Diagnosis not present

## 2021-04-12 ENCOUNTER — Encounter: Payer: Self-pay | Admitting: Internal Medicine

## 2021-04-12 NOTE — Progress Notes (Deleted)
? ? ?Subjective:  ? ? Patient ID: Haley Roberts, female    DOB: [birth year], 76 y.o.   MRN: [id number] ? ?This visit occurred during the SARS-CoV-2 public health emergency.  Safety protocols were in place, including screening questions prior to the visit, additional usage of staff PPE, and extensive cleaning of exam room while observing appropriate contact time as indicated for disinfecting solutions. ? ? ? ?HPI ?The patient is here for a follow up visit of her chronic medical problems.  ? ?She had a fall at home 08/2020 - periprosthetic fracture of R medial femoral condyle and R foot avulsion fx. ? ?08/2020: intractable back pain - severe spinal stenosis at L3/4 and L4/5 and had decompressive surgery 8/31.  Woke up unable to move right leg.  Also as left leg weakness.  She LLE numbness into the groin.  Has lack of sensation of bowel/bladder, but has some control.  Has done extensive PT.  Can walk with walker, but it is difficult. She has regained some strength in her RLE.   ? ?Chronic medical problems include RA, htn, depression, prediabetes ? ? ? ? ?Medications and allergies reviewed with patient and updated if appropriate. ? ?Patient Active Problem List  ? Diagnosis Date Noted  ? Situational anxiety 10/07/2020  ? Acute blood loss anemia   ? Right leg pain   ? Hyponatremia   ? Status post lumbar surgery 09/17/2020  ? Spondylolisthesis of lumbar region 09/16/2020  ? Intractable low back pain 09/09/2020  ? Difficulty urinating 11/18/2019  ? Hesitancy of micturition 11/18/2019  ? Bilateral leg edema 05/03/2018  ? Prediabetes 12/27/2017  ? Fibromyalgia 05/28/2015  ? S/P left TKA 07/07/2014  ? S/P knee replacement 07/07/2014  ? Right knee DJD 08/29/2010  ? Rheumatoid arthritis ([place]) 03/30/2010  ? Osteoarthritis 01/12/2009  ? Non-toxic nodular goiter 08/22/2007  ? Essential hypertension 08/22/2007  ? ANEMIA-NOS 05/31/2006  ? Depression 05/31/2006  ? ABNORMAL THYROID FUNCTION TESTS 05/31/2006  ? ? ?Current Outpatient  Medications on File Prior to Visit  ?Medication Sig Dispense Refill  ? apixaban (ELIQUIS) 2.5 MG TABS tablet Take 1 tablet (2.5 mg total) by mouth 2 (two) times daily. 60 tablet 0  ? bethanechol (URECHOLINE) 10 MG tablet Take 1 tablet (10 mg total) by mouth 4 (four) times daily. 120 tablet 0  ? cyclobenzaprine (FLEXERIL) 10 MG tablet Take 1 tablet (10 mg total) by mouth 3 (three) times daily as needed for muscle spasms. 90 tablet 0  ? escitalopram (LEXAPRO) 20 MG tablet Take 1 tablet (20 mg total) by mouth daily. 30 tablet 1  ? leflunomide (ARAVA) 10 MG tablet Take 1 tablet (10 mg total) by mouth daily. 30 tablet 0  ? losartan (COZAAR) 25 MG tablet TAKE 1 TABLET(25 MG) BY MOUTH DAILY 90 tablet 0  ? mirtazapine (REMERON) 15 MG tablet Take 1 tablet (15 mg total) by mouth at bedtime. 30 tablet 0  ? Multiple Vitamins-Minerals (WOMENS DAILY FORMULA) TABS Take 1 tablet by mouth daily.      ? potassium chloride (KLOR-CON 10) 10 MEQ tablet Take 1 tablet (10 mEq total) by mouth daily. 30 tablet 0  ? predniSONE (DELTASONE) 5 MG tablet Take 1 tablet (5 mg total) by mouth daily. 30 tablet 0  ? pregabalin (LYRICA) 75 MG capsule Take 1 capsule (75 mg total) by mouth at bedtime. 30 capsule 3  ? vitamin C (ASCORBIC ACID) 500 MG tablet Take 500 mg by mouth daily.      ? vitamin  E 180 MG (400 UNITS) capsule take 1 capsule by oral route every day    ? vitamin E 400 UNIT capsule Take 400 Units by mouth daily.      ? ?No current facility-administered medications on file prior to visit.  ? ? ?Past Medical History:  ?Diagnosis Date  ? Anemia   ? Anxiety   ? Depression   ? Fibromyalgia   ? Hyperglycemia   ? HYPERGLYCEMIA, FASTING 08/22/2007  ? Hypertension   ? Multiple thyroid nodules   ? gets ultra sound yearly on thryoid  ? Rheumatoid arthritis(714.0)   ? ? ?Past Surgical History:  ?Procedure Laterality Date  ? ABDOMINAL HYSTERECTOMY    ? with BSO  ? BREAST BIOPSY    ? JOINT REPLACEMENT    ? right knee replacement 2012  ? TOTAL KNEE  ARTHROPLASTY Left 07/07/2014  ? Procedure: LEFT TOTAL KNEE ARTHROPLASTY;  Surgeon: Paralee Cancel, MD;  Location: [facility];  Service: Orthopedics;  Laterality: Left;  ? ? ?Social History  ? ?Socioeconomic History  ? Marital status: Married  ?  Spouse name: Not on file  ? Number of children: Not on file  ? Years of education: Not on file  ? Highest education level: Not on file  ?Occupational History  ? Not on file  ?Tobacco Use  ? Smoking status: Never  ? Smokeless tobacco: Never  ?Vaping Use  ? Vaping Use: Never used  ?Substance and Sexual Activity  ? Alcohol use: No  ? Drug use: No  ? Sexual activity: Not Currently  ?Other Topics Concern  ? Not on file  ?Social History Narrative  ? Reg exercise  ?   ?   ? ?Social Determinants of Health  ? ?Financial Resource Strain: Not on file  ?Food Insecurity: Not on file  ?Transportation Needs: Not on file  ?Physical Activity: Not on file  ?Stress: Not on file  ?Social Connections: Not on file  ? ? ?Family History  ?Problem Relation Age of Onset  ? Cancer Mother 85  ?     breast  ? Arthritis Father   ? Hypertension Father   ? Diabetes Sister   ? Hypertension Brother   ? Diabetes Son   ? Depression Son   ? ? ?Review of Systems ? ?   ?Objective:  ?There were no vitals filed for this visit. ?BP Readings from Last 3 Encounters:  ?02/16/21 (!) 158/88  ?12/08/20 140/68  ?10/25/20 102/72  ? ?Wt Readings from Last 3 Encounters:  ?12/08/20 145 lb 12.8 oz (66.1 kg)  ?09/24/20 145 lb 8.1 oz (66 kg)  ?09/10/20 164 lb 0.4 oz (74.4 kg)  ? ?There is no height or weight on file to calculate BMI. ? ? Physical Exam ?   ? ? ? ? ? ?Assessment & Plan:  ? ? ?See Problem List for Assessment and Plan of chronic medical problems.  ? ? ? ? ? ?

## 2021-04-13 ENCOUNTER — Ambulatory Visit: Payer: Medicare Other | Admitting: Internal Medicine

## 2021-04-13 DIAGNOSIS — G629 Polyneuropathy, unspecified: Secondary | ICD-10-CM | POA: Diagnosis not present

## 2021-04-13 DIAGNOSIS — M4316 Spondylolisthesis, lumbar region: Secondary | ICD-10-CM | POA: Diagnosis not present

## 2021-04-13 DIAGNOSIS — M069 Rheumatoid arthritis, unspecified: Secondary | ICD-10-CM | POA: Diagnosis not present

## 2021-04-13 DIAGNOSIS — I10 Essential (primary) hypertension: Secondary | ICD-10-CM | POA: Diagnosis not present

## 2021-04-13 DIAGNOSIS — M21371 Foot drop, right foot: Secondary | ICD-10-CM | POA: Diagnosis not present

## 2021-04-13 DIAGNOSIS — M48061 Spinal stenosis, lumbar region without neurogenic claudication: Secondary | ICD-10-CM | POA: Diagnosis not present

## 2021-04-14 DIAGNOSIS — M069 Rheumatoid arthritis, unspecified: Secondary | ICD-10-CM | POA: Diagnosis not present

## 2021-04-14 DIAGNOSIS — G629 Polyneuropathy, unspecified: Secondary | ICD-10-CM | POA: Diagnosis not present

## 2021-04-14 DIAGNOSIS — M21371 Foot drop, right foot: Secondary | ICD-10-CM | POA: Diagnosis not present

## 2021-04-14 DIAGNOSIS — M48061 Spinal stenosis, lumbar region without neurogenic claudication: Secondary | ICD-10-CM | POA: Diagnosis not present

## 2021-04-14 DIAGNOSIS — M4316 Spondylolisthesis, lumbar region: Secondary | ICD-10-CM | POA: Diagnosis not present

## 2021-04-14 DIAGNOSIS — I10 Essential (primary) hypertension: Secondary | ICD-10-CM | POA: Diagnosis not present

## 2021-04-15 DIAGNOSIS — M48061 Spinal stenosis, lumbar region without neurogenic claudication: Secondary | ICD-10-CM | POA: Diagnosis not present

## 2021-04-15 DIAGNOSIS — M4316 Spondylolisthesis, lumbar region: Secondary | ICD-10-CM | POA: Diagnosis not present

## 2021-04-15 DIAGNOSIS — M069 Rheumatoid arthritis, unspecified: Secondary | ICD-10-CM | POA: Diagnosis not present

## 2021-04-15 DIAGNOSIS — M21371 Foot drop, right foot: Secondary | ICD-10-CM | POA: Diagnosis not present

## 2021-04-15 DIAGNOSIS — I10 Essential (primary) hypertension: Secondary | ICD-10-CM | POA: Diagnosis not present

## 2021-04-15 DIAGNOSIS — G629 Polyneuropathy, unspecified: Secondary | ICD-10-CM | POA: Diagnosis not present

## 2021-04-18 DIAGNOSIS — I10 Essential (primary) hypertension: Secondary | ICD-10-CM | POA: Diagnosis not present

## 2021-04-18 DIAGNOSIS — M4316 Spondylolisthesis, lumbar region: Secondary | ICD-10-CM | POA: Diagnosis not present

## 2021-04-18 DIAGNOSIS — M48061 Spinal stenosis, lumbar region without neurogenic claudication: Secondary | ICD-10-CM | POA: Diagnosis not present

## 2021-04-18 DIAGNOSIS — G629 Polyneuropathy, unspecified: Secondary | ICD-10-CM | POA: Diagnosis not present

## 2021-04-18 DIAGNOSIS — M21371 Foot drop, right foot: Secondary | ICD-10-CM | POA: Diagnosis not present

## 2021-04-18 DIAGNOSIS — M069 Rheumatoid arthritis, unspecified: Secondary | ICD-10-CM | POA: Diagnosis not present

## 2021-04-19 DIAGNOSIS — G629 Polyneuropathy, unspecified: Secondary | ICD-10-CM | POA: Diagnosis not present

## 2021-04-19 DIAGNOSIS — M48061 Spinal stenosis, lumbar region without neurogenic claudication: Secondary | ICD-10-CM | POA: Diagnosis not present

## 2021-04-19 DIAGNOSIS — M4316 Spondylolisthesis, lumbar region: Secondary | ICD-10-CM | POA: Diagnosis not present

## 2021-04-19 DIAGNOSIS — I10 Essential (primary) hypertension: Secondary | ICD-10-CM | POA: Diagnosis not present

## 2021-04-19 DIAGNOSIS — M069 Rheumatoid arthritis, unspecified: Secondary | ICD-10-CM | POA: Diagnosis not present

## 2021-04-19 DIAGNOSIS — M21371 Foot drop, right foot: Secondary | ICD-10-CM | POA: Diagnosis not present

## 2021-04-20 ENCOUNTER — Ambulatory Visit: Payer: Medicare Other | Admitting: Internal Medicine

## 2021-04-22 DIAGNOSIS — G629 Polyneuropathy, unspecified: Secondary | ICD-10-CM | POA: Diagnosis not present

## 2021-04-22 DIAGNOSIS — M48061 Spinal stenosis, lumbar region without neurogenic claudication: Secondary | ICD-10-CM | POA: Diagnosis not present

## 2021-04-22 DIAGNOSIS — M21371 Foot drop, right foot: Secondary | ICD-10-CM | POA: Diagnosis not present

## 2021-04-22 DIAGNOSIS — M4316 Spondylolisthesis, lumbar region: Secondary | ICD-10-CM | POA: Diagnosis not present

## 2021-04-22 DIAGNOSIS — I10 Essential (primary) hypertension: Secondary | ICD-10-CM | POA: Diagnosis not present

## 2021-04-22 DIAGNOSIS — M069 Rheumatoid arthritis, unspecified: Secondary | ICD-10-CM | POA: Diagnosis not present

## 2021-04-26 ENCOUNTER — Ambulatory Visit (INDEPENDENT_AMBULATORY_CARE_PROVIDER_SITE_OTHER): Payer: Medicare Other | Admitting: Internal Medicine

## 2021-04-26 ENCOUNTER — Encounter: Payer: Self-pay | Admitting: Internal Medicine

## 2021-04-26 VITALS — BP 142/78 | HR 82 | Temp 98.3°F

## 2021-04-26 DIAGNOSIS — F3289 Other specified depressive episodes: Secondary | ICD-10-CM

## 2021-04-26 DIAGNOSIS — R7303 Prediabetes: Secondary | ICD-10-CM

## 2021-04-26 DIAGNOSIS — D649 Anemia, unspecified: Secondary | ICD-10-CM

## 2021-04-26 DIAGNOSIS — M069 Rheumatoid arthritis, unspecified: Secondary | ICD-10-CM

## 2021-04-26 DIAGNOSIS — I10 Essential (primary) hypertension: Secondary | ICD-10-CM

## 2021-04-26 DIAGNOSIS — Z86718 Personal history of other venous thrombosis and embolism: Secondary | ICD-10-CM | POA: Diagnosis not present

## 2021-04-26 LAB — CBC WITH DIFFERENTIAL/PLATELET
Basophils Absolute: 0.1 10*3/uL (ref 0.0–0.1)
Basophils Relative: 1.2 % (ref 0.0–3.0)
Eosinophils Absolute: 0.2 10*3/uL (ref 0.0–0.7)
Eosinophils Relative: 3.4 % (ref 0.0–5.0)
HCT: 32 % — ABNORMAL LOW (ref 36.0–46.0)
Hemoglobin: 10.4 g/dL — ABNORMAL LOW (ref 12.0–15.0)
Lymphocytes Relative: 19.6 % (ref 12.0–46.0)
Lymphs Abs: 1.2 10*3/uL (ref 0.7–4.0)
MCHC: 32.4 g/dL (ref 30.0–36.0)
MCV: 86.4 fl (ref 78.0–100.0)
Monocytes Absolute: 0.6 10*3/uL (ref 0.1–1.0)
Monocytes Relative: 10.5 % (ref 3.0–12.0)
Neutro Abs: 3.9 10*3/uL (ref 1.4–7.7)
Neutrophils Relative %: 65.3 % (ref 43.0–77.0)
Platelets: 369 10*3/uL (ref 150.0–400.0)
RBC: 3.7 Mil/uL — ABNORMAL LOW (ref 3.87–5.11)
RDW: 17.2 % — ABNORMAL HIGH (ref 11.5–15.5)
WBC: 5.9 10*3/uL (ref 4.0–10.5)

## 2021-04-26 LAB — IBC PANEL
Iron: 57 ug/dL (ref 42–145)
Saturation Ratios: 20 % (ref 20.0–50.0)
TIBC: 285.6 ug/dL (ref 250.0–450.0)
Transferrin: 204 mg/dL — ABNORMAL LOW (ref 212.0–360.0)

## 2021-04-26 LAB — FERRITIN: Ferritin: 97.5 ng/mL (ref 10.0–291.0)

## 2021-04-26 MED ORDER — APIXABAN 2.5 MG PO TABS
2.5000 mg | ORAL_TABLET | Freq: Two times a day (BID) | ORAL | 0 refills | Status: DC
Start: 2021-04-26 — End: 2021-06-06

## 2021-04-26 MED ORDER — LOSARTAN POTASSIUM 25 MG PO TABS: ORAL_TABLET | ORAL | 3 refills | Status: DC

## 2021-04-26 MED ORDER — ESCITALOPRAM OXALATE 20 MG PO TABS: 20.0000 mg | ORAL_TABLET | Freq: Every day | ORAL | 3 refills | Status: DC

## 2021-04-26 NOTE — Assessment & Plan Note (Signed)
Chronic ?Management per Dr. Amil Amen ?

## 2021-04-26 NOTE — Assessment & Plan Note (Signed)
Chronic Controlled, stable Continue lexapro 20 mg daily  

## 2021-04-26 NOTE — Progress Notes (Signed)
? ? ?Subjective:  ? ? Patient ID: Haley Roberts, female    DOB: [birth year], 76 y.o.   MRN: [id number] ? ?This visit occurred during the SARS-CoV-2 public health emergency.  Safety protocols were in place, including screening questions prior to the visit, additional usage of staff PPE, and extensive cleaning of exam room while observing appropriate contact time as indicated for disinfecting solutions. ? ? ? ?HPI ?The patient is here for a follow up visit of her chronic medical problems.  ? ?She had a fall at home 08/2020 - periprosthetic fracture of R medial femoral condyle and R foot avulsion fx. ? ?08/2020: intractable back pain - severe spinal stenosis at L3/4 and L4/5 and had decompressive surgery 8/31.  Woke up unable to move right leg.  Also as left leg weakness.  She LLE numbness into the groin.  Has lack of sensation of bowel/bladder, but has some control.  Has done extensive PT.  Can walk with walker, but it is difficult. She has regained some strength in her RLE.   ? ?Chronic medical problems include RA, htn, depression, prediabetes ? ?Going to [facility] -  to have EMG. Had MRI.  Doing PT/OT at home.  She can stand up for 5 minutes.  Can walk 25 steps with walker.  Both feet are numb.  Foot drop in right foot.  No back pain.   ? ? ?DVT in November at rehab -started on Eliquis twice daily, which she is taking.  Denies any abnormal bleeding. ? ? ? ? ?Medications and allergies reviewed with patient and updated if appropriate. ? ?Patient Active Problem List  ? Diagnosis Date Noted  ? History of deep venous thrombosis (DVT) of distal vein of right lower extremity, November 2022 04/26/2021  ? Situational anxiety 10/07/2020  ? Right leg pain   ? Status post lumbar surgery 09/17/2020  ? Spondylolisthesis of lumbar region 09/16/2020  ? Intractable low back pain 09/09/2020  ? Difficulty urinating 11/18/2019  ? Bilateral leg edema 05/03/2018  ? Prediabetes 12/27/2017  ? Fibromyalgia 05/28/2015  ? S/P left TKA  07/07/2014  ? S/P knee replacement 07/07/2014  ? Right knee DJD 08/29/2010  ? Rheumatoid arthritis ([place]) 03/30/2010  ? Osteoarthritis 01/12/2009  ? Non-toxic nodular goiter 08/22/2007  ? Essential hypertension 08/22/2007  ? Anemia 05/31/2006  ? Depression 05/31/2006  ? ABNORMAL THYROID FUNCTION TESTS 05/31/2006  ? ? ?Current Outpatient Medications on File Prior to Visit  ?Medication Sig Dispense Refill  ? bethanechol (URECHOLINE) 10 MG tablet Take 1 tablet (10 mg total) by mouth 4 (four) times daily. 120 tablet 0  ? cyclobenzaprine (FLEXERIL) 10 MG tablet Take 1 tablet (10 mg total) by mouth 3 (three) times daily as needed for muscle spasms. 90 tablet 0  ? leflunomide (ARAVA) 10 MG tablet Take 1 tablet (10 mg total) by mouth daily. 30 tablet 0  ? Multiple Vitamins-Minerals (WOMENS DAILY FORMULA) TABS Take 1 tablet by mouth daily.      ? potassium chloride (KLOR-CON 10) 10 MEQ tablet Take 1 tablet (10 mEq total) by mouth daily. 30 tablet 0  ? predniSONE (DELTASONE) 5 MG tablet Take 1 tablet (5 mg total) by mouth daily. 30 tablet 0  ? vitamin C (ASCORBIC ACID) 500 MG tablet Take 500 mg by mouth daily.      ? vitamin E 180 MG (400 UNITS) capsule take 1 capsule by oral route every day    ? vitamin E 400 UNIT capsule Take 400 Units by mouth  daily.      ? cyclobenzaprine (FLEXERIL) 5 MG tablet Take 5 mg by mouth 3 (three) times daily.    ? HYDROcodone-acetaminophen (NORCO) 10-325 MG tablet Take 1 tablet by mouth every 6 (six) hours as needed.    ? pregabalin (LYRICA) 75 MG capsule Take 1 capsule (75 mg total) by mouth at bedtime. 30 capsule 3  ? ?No current facility-administered medications on file prior to visit.  ? ? ?Past Medical History:  ?Diagnosis Date  ? Anemia   ? Anxiety   ? Depression   ? Fibromyalgia   ? Hyperglycemia   ? HYPERGLYCEMIA, FASTING 08/22/2007  ? Hypertension   ? Multiple thyroid nodules   ? gets ultra sound yearly on thryoid  ? Rheumatoid arthritis(714.0)   ? ? ?Past Surgical History:  ?Procedure  Laterality Date  ? ABDOMINAL HYSTERECTOMY    ? with BSO  ? BREAST BIOPSY    ? JOINT REPLACEMENT    ? right knee replacement 2012  ? TOTAL KNEE ARTHROPLASTY Left 07/07/2014  ? Procedure: LEFT TOTAL KNEE ARTHROPLASTY;  Surgeon: Paralee Cancel, MD;  Location: [facility];  Service: Orthopedics;  Laterality: Left;  ? ? ?Social History  ? ?Socioeconomic History  ? Marital status: Married  ?  Spouse name: Not on file  ? Number of children: Not on file  ? Years of education: Not on file  ? Highest education level: Not on file  ?Occupational History  ? Not on file  ?Tobacco Use  ? Smoking status: Never  ? Smokeless tobacco: Never  ?Vaping Use  ? Vaping Use: Never used  ?Substance and Sexual Activity  ? Alcohol use: No  ? Drug use: No  ? Sexual activity: Not Currently  ?Other Topics Concern  ? Not on file  ?Social History Narrative  ? Reg exercise  ?   ?   ? ?Social Determinants of Health  ? ?Financial Resource Strain: Not on file  ?Food Insecurity: Not on file  ?Transportation Needs: Not on file  ?Physical Activity: Not on file  ?Stress: Not on file  ?Social Connections: Not on file  ? ? ?Family History  ?Problem Relation Age of Onset  ? Cancer Mother 37  ?     breast  ? Arthritis Father   ? Hypertension Father   ? Diabetes Sister   ? Hypertension Brother   ? Diabetes Son   ? Depression Son   ? ? ?Review of Systems  ?Constitutional:  Negative for chills and fever.  ?Respiratory:  Negative for cough, shortness of breath and wheezing.   ?Cardiovascular:  Positive for leg swelling (right - mild). Negative for chest pain and palpitations.  ?Gastrointestinal:  Positive for abdominal pain and constipation (miralax).  ?     No gerd  ?Neurological:  Negative for light-headedness and headaches.  ? ?   ?Objective:  ? ?Vitals:  ? 04/26/21 1301  ?BP: (!) 142/78  ?Pulse: 82  ?Temp: 98.3 ?F (36.8 ?C)  ?SpO2: 97%  ? ?BP Readings from Last 3 Encounters:  ?04/26/21 (!) 142/78  ?02/16/21 (!) 158/88  ?12/08/20 140/68  ? ?Wt Readings from Last 3  Encounters:  ?12/08/20 145 lb 12.8 oz (66.1 kg)  ?09/24/20 145 lb 8.1 oz (66 kg)  ?09/10/20 164 lb 0.4 oz (74.4 kg)  ? ?There is no height or weight on file to calculate BMI. ? ? Physical Exam ?Constitutional:   ?   General: She is not in acute distress. ?   Appearance: Normal appearance.  ?  HENT:  ?   Head: Normocephalic and atraumatic.  ?Eyes:  ?   Conjunctiva/sclera: Conjunctivae normal.  ?Cardiovascular:  ?   Rate and Rhythm: Normal rate and regular rhythm.  ?   Heart sounds: Normal heart sounds. No murmur heard. ?Pulmonary:  ?   Effort: Pulmonary effort is normal. No respiratory distress.  ?   Breath sounds: Normal breath sounds. No wheezing.  ?Musculoskeletal:  ?   Cervical back: Neck supple.  ?   Right lower leg: Edema (Trace edema, nonpitting) present.  ?   Left lower leg: No edema.  ?Lymphadenopathy:  ?   Cervical: No cervical adenopathy.  ?Skin: ?   Findings: No rash.  ?Neurological:  ?   Mental Status: She is alert. Mental status is at baseline.  ?Psychiatric:     ?   Mood and Affect: Mood normal.     ?   Behavior: Behavior normal.  ? ?   ? ? ? ? ? ?Assessment & Plan:  ? ? ?See Problem List for Assessment and Plan of chronic medical problems.  ? ? ? ? ? ?

## 2021-04-26 NOTE — Assessment & Plan Note (Signed)
Subacute ?DVT in right lower extremity diagnosed at rehab ?Occurred after hospitalization and surgery ?Current Eliquis 2.5 mg twice daily, which she is still taking ?We will plan to complete about 6 months-not sure exactly when she started the medication ?Plan to stop sometime in May ?CBC ?

## 2021-04-26 NOTE — Patient Instructions (Addendum)
? ? ?  Blood work ordered ? ? ? ?Medications changes include :   stop eliquis in may or early june ? ? ? ? ? ?Return in about 1 year (around 04/27/2022) for follow up. ? ?

## 2021-04-26 NOTE — Assessment & Plan Note (Signed)
Chronic ?Lab Results  ?Component Value Date  ? HGBA1C 5.6 02/16/2021  ? ? ?Recent a1c in normal range ?

## 2021-04-26 NOTE — Assessment & Plan Note (Signed)
Chronic Blood pressure well-controlled Continue losartan 25 mg daily 

## 2021-04-26 NOTE — Assessment & Plan Note (Signed)
Acute on chronic ?Has chronic anemia likely related to RA ?Most recent blood work shows drop in hemoglobin, which could be related to RA, but she is also on Eliquis so this is concerning ?Check CBC, iron panel ?Continue Eliquis 2.5 mg twice daily-should be able to complete Eliquis sometime next month depending on prescriptions ?

## 2021-04-27 ENCOUNTER — Other Ambulatory Visit: Payer: Self-pay | Admitting: Internal Medicine

## 2021-04-27 DIAGNOSIS — M069 Rheumatoid arthritis, unspecified: Secondary | ICD-10-CM | POA: Diagnosis not present

## 2021-04-27 DIAGNOSIS — M21371 Foot drop, right foot: Secondary | ICD-10-CM | POA: Diagnosis not present

## 2021-04-27 DIAGNOSIS — G629 Polyneuropathy, unspecified: Secondary | ICD-10-CM | POA: Diagnosis not present

## 2021-04-27 DIAGNOSIS — M48061 Spinal stenosis, lumbar region without neurogenic claudication: Secondary | ICD-10-CM | POA: Diagnosis not present

## 2021-04-27 DIAGNOSIS — I10 Essential (primary) hypertension: Secondary | ICD-10-CM | POA: Diagnosis not present

## 2021-04-27 DIAGNOSIS — M4316 Spondylolisthesis, lumbar region: Secondary | ICD-10-CM | POA: Diagnosis not present

## 2021-04-29 DIAGNOSIS — G629 Polyneuropathy, unspecified: Secondary | ICD-10-CM | POA: Diagnosis not present

## 2021-04-29 DIAGNOSIS — M48061 Spinal stenosis, lumbar region without neurogenic claudication: Secondary | ICD-10-CM | POA: Diagnosis not present

## 2021-04-29 DIAGNOSIS — M21371 Foot drop, right foot: Secondary | ICD-10-CM | POA: Diagnosis not present

## 2021-04-29 DIAGNOSIS — M069 Rheumatoid arthritis, unspecified: Secondary | ICD-10-CM | POA: Diagnosis not present

## 2021-04-29 DIAGNOSIS — I10 Essential (primary) hypertension: Secondary | ICD-10-CM | POA: Diagnosis not present

## 2021-04-29 DIAGNOSIS — M4316 Spondylolisthesis, lumbar region: Secondary | ICD-10-CM | POA: Diagnosis not present

## 2021-05-02 DIAGNOSIS — M069 Rheumatoid arthritis, unspecified: Secondary | ICD-10-CM | POA: Diagnosis not present

## 2021-05-02 DIAGNOSIS — G629 Polyneuropathy, unspecified: Secondary | ICD-10-CM | POA: Diagnosis not present

## 2021-05-02 DIAGNOSIS — M21371 Foot drop, right foot: Secondary | ICD-10-CM | POA: Diagnosis not present

## 2021-05-02 DIAGNOSIS — M48061 Spinal stenosis, lumbar region without neurogenic claudication: Secondary | ICD-10-CM | POA: Diagnosis not present

## 2021-05-02 DIAGNOSIS — M4316 Spondylolisthesis, lumbar region: Secondary | ICD-10-CM | POA: Diagnosis not present

## 2021-05-02 DIAGNOSIS — I10 Essential (primary) hypertension: Secondary | ICD-10-CM | POA: Diagnosis not present

## 2021-05-04 DIAGNOSIS — G629 Polyneuropathy, unspecified: Secondary | ICD-10-CM | POA: Diagnosis not present

## 2021-05-04 DIAGNOSIS — M21371 Foot drop, right foot: Secondary | ICD-10-CM | POA: Diagnosis not present

## 2021-05-04 DIAGNOSIS — M4316 Spondylolisthesis, lumbar region: Secondary | ICD-10-CM | POA: Diagnosis not present

## 2021-05-04 DIAGNOSIS — I10 Essential (primary) hypertension: Secondary | ICD-10-CM | POA: Diagnosis not present

## 2021-05-04 DIAGNOSIS — M069 Rheumatoid arthritis, unspecified: Secondary | ICD-10-CM | POA: Diagnosis not present

## 2021-05-04 DIAGNOSIS — M48061 Spinal stenosis, lumbar region without neurogenic claudication: Secondary | ICD-10-CM | POA: Diagnosis not present

## 2021-05-05 DIAGNOSIS — G629 Polyneuropathy, unspecified: Secondary | ICD-10-CM | POA: Diagnosis not present

## 2021-05-05 DIAGNOSIS — Z981 Arthrodesis status: Secondary | ICD-10-CM | POA: Diagnosis not present

## 2021-05-05 DIAGNOSIS — G5621 Lesion of ulnar nerve, right upper limb: Secondary | ICD-10-CM | POA: Diagnosis not present

## 2021-05-05 DIAGNOSIS — M5417 Radiculopathy, lumbosacral region: Secondary | ICD-10-CM | POA: Diagnosis not present

## 2021-05-06 DIAGNOSIS — M629 Disorder of muscle, unspecified: Secondary | ICD-10-CM | POA: Diagnosis not present

## 2021-05-06 DIAGNOSIS — R7303 Prediabetes: Secondary | ICD-10-CM | POA: Diagnosis not present

## 2021-05-06 DIAGNOSIS — Z7901 Long term (current) use of anticoagulants: Secondary | ICD-10-CM | POA: Diagnosis not present

## 2021-05-06 DIAGNOSIS — M48061 Spinal stenosis, lumbar region without neurogenic claudication: Secondary | ICD-10-CM | POA: Diagnosis not present

## 2021-05-06 DIAGNOSIS — M069 Rheumatoid arthritis, unspecified: Secondary | ICD-10-CM | POA: Diagnosis not present

## 2021-05-06 DIAGNOSIS — Z79891 Long term (current) use of opiate analgesic: Secondary | ICD-10-CM | POA: Diagnosis not present

## 2021-05-06 DIAGNOSIS — F32A Depression, unspecified: Secondary | ICD-10-CM | POA: Diagnosis not present

## 2021-05-06 DIAGNOSIS — F419 Anxiety disorder, unspecified: Secondary | ICD-10-CM | POA: Diagnosis not present

## 2021-05-06 DIAGNOSIS — K5900 Constipation, unspecified: Secondary | ICD-10-CM | POA: Diagnosis not present

## 2021-05-06 DIAGNOSIS — M5416 Radiculopathy, lumbar region: Secondary | ICD-10-CM | POA: Diagnosis not present

## 2021-05-06 DIAGNOSIS — M797 Fibromyalgia: Secondary | ICD-10-CM | POA: Diagnosis not present

## 2021-05-06 DIAGNOSIS — G5621 Lesion of ulnar nerve, right upper limb: Secondary | ICD-10-CM | POA: Diagnosis not present

## 2021-05-06 DIAGNOSIS — D649 Anemia, unspecified: Secondary | ICD-10-CM | POA: Diagnosis not present

## 2021-05-06 DIAGNOSIS — M1990 Unspecified osteoarthritis, unspecified site: Secondary | ICD-10-CM | POA: Diagnosis not present

## 2021-05-06 DIAGNOSIS — R600 Localized edema: Secondary | ICD-10-CM | POA: Diagnosis not present

## 2021-05-06 DIAGNOSIS — I10 Essential (primary) hypertension: Secondary | ICD-10-CM | POA: Diagnosis not present

## 2021-05-06 DIAGNOSIS — G629 Polyneuropathy, unspecified: Secondary | ICD-10-CM | POA: Diagnosis not present

## 2021-05-06 DIAGNOSIS — Z9181 History of falling: Secondary | ICD-10-CM | POA: Diagnosis not present

## 2021-05-06 DIAGNOSIS — M4316 Spondylolisthesis, lumbar region: Secondary | ICD-10-CM | POA: Diagnosis not present

## 2021-05-06 DIAGNOSIS — M21371 Foot drop, right foot: Secondary | ICD-10-CM | POA: Diagnosis not present

## 2021-05-06 DIAGNOSIS — Z7952 Long term (current) use of systemic steroids: Secondary | ICD-10-CM | POA: Diagnosis not present

## 2021-05-09 DIAGNOSIS — G629 Polyneuropathy, unspecified: Secondary | ICD-10-CM | POA: Diagnosis not present

## 2021-05-09 DIAGNOSIS — M069 Rheumatoid arthritis, unspecified: Secondary | ICD-10-CM | POA: Diagnosis not present

## 2021-05-09 DIAGNOSIS — I10 Essential (primary) hypertension: Secondary | ICD-10-CM | POA: Diagnosis not present

## 2021-05-09 DIAGNOSIS — M21371 Foot drop, right foot: Secondary | ICD-10-CM | POA: Diagnosis not present

## 2021-05-09 DIAGNOSIS — M4316 Spondylolisthesis, lumbar region: Secondary | ICD-10-CM | POA: Diagnosis not present

## 2021-05-09 DIAGNOSIS — M48061 Spinal stenosis, lumbar region without neurogenic claudication: Secondary | ICD-10-CM | POA: Diagnosis not present

## 2021-05-10 DIAGNOSIS — M25421 Effusion, right elbow: Secondary | ICD-10-CM | POA: Diagnosis not present

## 2021-05-10 DIAGNOSIS — M1991 Primary osteoarthritis, unspecified site: Secondary | ICD-10-CM | POA: Diagnosis not present

## 2021-05-10 DIAGNOSIS — M5136 Other intervertebral disc degeneration, lumbar region: Secondary | ICD-10-CM | POA: Diagnosis not present

## 2021-05-10 DIAGNOSIS — M0589 Other rheumatoid arthritis with rheumatoid factor of multiple sites: Secondary | ICD-10-CM | POA: Diagnosis not present

## 2021-05-10 DIAGNOSIS — M797 Fibromyalgia: Secondary | ICD-10-CM | POA: Diagnosis not present

## 2021-05-11 DIAGNOSIS — G629 Polyneuropathy, unspecified: Secondary | ICD-10-CM | POA: Diagnosis not present

## 2021-05-11 DIAGNOSIS — M069 Rheumatoid arthritis, unspecified: Secondary | ICD-10-CM | POA: Diagnosis not present

## 2021-05-11 DIAGNOSIS — M48061 Spinal stenosis, lumbar region without neurogenic claudication: Secondary | ICD-10-CM | POA: Diagnosis not present

## 2021-05-11 DIAGNOSIS — I10 Essential (primary) hypertension: Secondary | ICD-10-CM | POA: Diagnosis not present

## 2021-05-11 DIAGNOSIS — M21371 Foot drop, right foot: Secondary | ICD-10-CM | POA: Diagnosis not present

## 2021-05-11 DIAGNOSIS — M4316 Spondylolisthesis, lumbar region: Secondary | ICD-10-CM | POA: Diagnosis not present

## 2021-05-12 DIAGNOSIS — I10 Essential (primary) hypertension: Secondary | ICD-10-CM | POA: Diagnosis not present

## 2021-05-12 DIAGNOSIS — G629 Polyneuropathy, unspecified: Secondary | ICD-10-CM | POA: Diagnosis not present

## 2021-05-12 DIAGNOSIS — M069 Rheumatoid arthritis, unspecified: Secondary | ICD-10-CM | POA: Diagnosis not present

## 2021-05-12 DIAGNOSIS — M4316 Spondylolisthesis, lumbar region: Secondary | ICD-10-CM | POA: Diagnosis not present

## 2021-05-12 DIAGNOSIS — M21371 Foot drop, right foot: Secondary | ICD-10-CM | POA: Diagnosis not present

## 2021-05-12 DIAGNOSIS — M48061 Spinal stenosis, lumbar region without neurogenic claudication: Secondary | ICD-10-CM | POA: Diagnosis not present

## 2021-05-13 DIAGNOSIS — M48061 Spinal stenosis, lumbar region without neurogenic claudication: Secondary | ICD-10-CM | POA: Diagnosis not present

## 2021-05-13 DIAGNOSIS — M4316 Spondylolisthesis, lumbar region: Secondary | ICD-10-CM | POA: Diagnosis not present

## 2021-05-13 DIAGNOSIS — M21371 Foot drop, right foot: Secondary | ICD-10-CM | POA: Diagnosis not present

## 2021-05-13 DIAGNOSIS — I10 Essential (primary) hypertension: Secondary | ICD-10-CM | POA: Diagnosis not present

## 2021-05-13 DIAGNOSIS — M069 Rheumatoid arthritis, unspecified: Secondary | ICD-10-CM | POA: Diagnosis not present

## 2021-05-13 DIAGNOSIS — G629 Polyneuropathy, unspecified: Secondary | ICD-10-CM | POA: Diagnosis not present

## 2021-05-16 DIAGNOSIS — M48061 Spinal stenosis, lumbar region without neurogenic claudication: Secondary | ICD-10-CM | POA: Diagnosis not present

## 2021-05-16 DIAGNOSIS — M21371 Foot drop, right foot: Secondary | ICD-10-CM | POA: Diagnosis not present

## 2021-05-16 DIAGNOSIS — M4316 Spondylolisthesis, lumbar region: Secondary | ICD-10-CM | POA: Diagnosis not present

## 2021-05-16 DIAGNOSIS — I10 Essential (primary) hypertension: Secondary | ICD-10-CM | POA: Diagnosis not present

## 2021-05-16 DIAGNOSIS — M069 Rheumatoid arthritis, unspecified: Secondary | ICD-10-CM | POA: Diagnosis not present

## 2021-05-16 DIAGNOSIS — G629 Polyneuropathy, unspecified: Secondary | ICD-10-CM | POA: Diagnosis not present

## 2021-05-18 DIAGNOSIS — M4316 Spondylolisthesis, lumbar region: Secondary | ICD-10-CM | POA: Diagnosis not present

## 2021-05-18 DIAGNOSIS — I10 Essential (primary) hypertension: Secondary | ICD-10-CM | POA: Diagnosis not present

## 2021-05-18 DIAGNOSIS — M48061 Spinal stenosis, lumbar region without neurogenic claudication: Secondary | ICD-10-CM | POA: Diagnosis not present

## 2021-05-18 DIAGNOSIS — M069 Rheumatoid arthritis, unspecified: Secondary | ICD-10-CM | POA: Diagnosis not present

## 2021-05-18 DIAGNOSIS — G629 Polyneuropathy, unspecified: Secondary | ICD-10-CM | POA: Diagnosis not present

## 2021-05-18 DIAGNOSIS — M21371 Foot drop, right foot: Secondary | ICD-10-CM | POA: Diagnosis not present

## 2021-05-19 DIAGNOSIS — M069 Rheumatoid arthritis, unspecified: Secondary | ICD-10-CM | POA: Diagnosis not present

## 2021-05-19 DIAGNOSIS — M4316 Spondylolisthesis, lumbar region: Secondary | ICD-10-CM | POA: Diagnosis not present

## 2021-05-19 DIAGNOSIS — M48061 Spinal stenosis, lumbar region without neurogenic claudication: Secondary | ICD-10-CM | POA: Diagnosis not present

## 2021-05-19 DIAGNOSIS — I10 Essential (primary) hypertension: Secondary | ICD-10-CM | POA: Diagnosis not present

## 2021-05-19 DIAGNOSIS — G629 Polyneuropathy, unspecified: Secondary | ICD-10-CM | POA: Diagnosis not present

## 2021-05-19 DIAGNOSIS — M21371 Foot drop, right foot: Secondary | ICD-10-CM | POA: Diagnosis not present

## 2021-05-20 DIAGNOSIS — I10 Essential (primary) hypertension: Secondary | ICD-10-CM | POA: Diagnosis not present

## 2021-05-20 DIAGNOSIS — M21371 Foot drop, right foot: Secondary | ICD-10-CM | POA: Diagnosis not present

## 2021-05-20 DIAGNOSIS — M48061 Spinal stenosis, lumbar region without neurogenic claudication: Secondary | ICD-10-CM | POA: Diagnosis not present

## 2021-05-20 DIAGNOSIS — M4316 Spondylolisthesis, lumbar region: Secondary | ICD-10-CM | POA: Diagnosis not present

## 2021-05-20 DIAGNOSIS — G629 Polyneuropathy, unspecified: Secondary | ICD-10-CM | POA: Diagnosis not present

## 2021-05-20 DIAGNOSIS — M069 Rheumatoid arthritis, unspecified: Secondary | ICD-10-CM | POA: Diagnosis not present

## 2021-05-23 ENCOUNTER — Encounter: Payer: Self-pay | Admitting: Internal Medicine

## 2021-05-23 DIAGNOSIS — Z9889 Other specified postprocedural states: Secondary | ICD-10-CM

## 2021-05-23 DIAGNOSIS — R5381 Other malaise: Secondary | ICD-10-CM

## 2021-05-23 DIAGNOSIS — M069 Rheumatoid arthritis, unspecified: Secondary | ICD-10-CM

## 2021-05-23 DIAGNOSIS — M4316 Spondylolisthesis, lumbar region: Secondary | ICD-10-CM

## 2021-05-25 DIAGNOSIS — M069 Rheumatoid arthritis, unspecified: Secondary | ICD-10-CM | POA: Diagnosis not present

## 2021-05-25 DIAGNOSIS — M4316 Spondylolisthesis, lumbar region: Secondary | ICD-10-CM | POA: Diagnosis not present

## 2021-05-25 DIAGNOSIS — M48061 Spinal stenosis, lumbar region without neurogenic claudication: Secondary | ICD-10-CM | POA: Diagnosis not present

## 2021-05-25 DIAGNOSIS — M21371 Foot drop, right foot: Secondary | ICD-10-CM | POA: Diagnosis not present

## 2021-05-25 DIAGNOSIS — G629 Polyneuropathy, unspecified: Secondary | ICD-10-CM | POA: Diagnosis not present

## 2021-05-25 DIAGNOSIS — I10 Essential (primary) hypertension: Secondary | ICD-10-CM | POA: Diagnosis not present

## 2021-05-26 DIAGNOSIS — M48061 Spinal stenosis, lumbar region without neurogenic claudication: Secondary | ICD-10-CM | POA: Diagnosis not present

## 2021-05-26 DIAGNOSIS — M4316 Spondylolisthesis, lumbar region: Secondary | ICD-10-CM | POA: Diagnosis not present

## 2021-05-26 DIAGNOSIS — M21371 Foot drop, right foot: Secondary | ICD-10-CM | POA: Diagnosis not present

## 2021-05-26 DIAGNOSIS — I10 Essential (primary) hypertension: Secondary | ICD-10-CM | POA: Diagnosis not present

## 2021-05-26 DIAGNOSIS — G629 Polyneuropathy, unspecified: Secondary | ICD-10-CM | POA: Diagnosis not present

## 2021-05-26 DIAGNOSIS — M069 Rheumatoid arthritis, unspecified: Secondary | ICD-10-CM | POA: Diagnosis not present

## 2021-05-27 DIAGNOSIS — M069 Rheumatoid arthritis, unspecified: Secondary | ICD-10-CM | POA: Diagnosis not present

## 2021-05-27 DIAGNOSIS — M4316 Spondylolisthesis, lumbar region: Secondary | ICD-10-CM | POA: Diagnosis not present

## 2021-05-27 DIAGNOSIS — G629 Polyneuropathy, unspecified: Secondary | ICD-10-CM | POA: Diagnosis not present

## 2021-05-27 DIAGNOSIS — M21371 Foot drop, right foot: Secondary | ICD-10-CM | POA: Diagnosis not present

## 2021-05-27 DIAGNOSIS — M48061 Spinal stenosis, lumbar region without neurogenic claudication: Secondary | ICD-10-CM | POA: Diagnosis not present

## 2021-05-27 DIAGNOSIS — I10 Essential (primary) hypertension: Secondary | ICD-10-CM | POA: Diagnosis not present

## 2021-05-31 ENCOUNTER — Other Ambulatory Visit: Payer: Self-pay | Admitting: Internal Medicine

## 2021-06-02 DIAGNOSIS — G629 Polyneuropathy, unspecified: Secondary | ICD-10-CM | POA: Diagnosis not present

## 2021-06-02 DIAGNOSIS — M5416 Radiculopathy, lumbar region: Secondary | ICD-10-CM | POA: Diagnosis not present

## 2021-06-02 DIAGNOSIS — M4316 Spondylolisthesis, lumbar region: Secondary | ICD-10-CM | POA: Diagnosis not present

## 2021-06-02 DIAGNOSIS — M48061 Spinal stenosis, lumbar region without neurogenic claudication: Secondary | ICD-10-CM | POA: Diagnosis not present

## 2021-06-02 DIAGNOSIS — M21371 Foot drop, right foot: Secondary | ICD-10-CM | POA: Diagnosis not present

## 2021-06-02 DIAGNOSIS — I10 Essential (primary) hypertension: Secondary | ICD-10-CM | POA: Diagnosis not present

## 2021-06-02 DIAGNOSIS — M069 Rheumatoid arthritis, unspecified: Secondary | ICD-10-CM | POA: Diagnosis not present

## 2021-06-03 DIAGNOSIS — M48061 Spinal stenosis, lumbar region without neurogenic claudication: Secondary | ICD-10-CM | POA: Diagnosis not present

## 2021-06-03 DIAGNOSIS — M069 Rheumatoid arthritis, unspecified: Secondary | ICD-10-CM | POA: Diagnosis not present

## 2021-06-03 DIAGNOSIS — G629 Polyneuropathy, unspecified: Secondary | ICD-10-CM | POA: Diagnosis not present

## 2021-06-03 DIAGNOSIS — I10 Essential (primary) hypertension: Secondary | ICD-10-CM | POA: Diagnosis not present

## 2021-06-03 DIAGNOSIS — M4316 Spondylolisthesis, lumbar region: Secondary | ICD-10-CM | POA: Diagnosis not present

## 2021-06-03 DIAGNOSIS — M21371 Foot drop, right foot: Secondary | ICD-10-CM | POA: Diagnosis not present

## 2021-06-05 ENCOUNTER — Other Ambulatory Visit: Payer: Self-pay | Admitting: Internal Medicine

## 2021-06-15 DIAGNOSIS — M4807 Spinal stenosis, lumbosacral region: Secondary | ICD-10-CM | POA: Diagnosis not present

## 2021-06-15 DIAGNOSIS — M48061 Spinal stenosis, lumbar region without neurogenic claudication: Secondary | ICD-10-CM | POA: Diagnosis not present

## 2021-06-15 DIAGNOSIS — M5136 Other intervertebral disc degeneration, lumbar region: Secondary | ICD-10-CM | POA: Diagnosis not present

## 2021-06-15 DIAGNOSIS — G9763 Postprocedural seroma of a nervous system organ or structure following a nervous system procedure: Secondary | ICD-10-CM | POA: Diagnosis not present

## 2021-06-15 DIAGNOSIS — M5416 Radiculopathy, lumbar region: Secondary | ICD-10-CM | POA: Diagnosis not present

## 2021-06-15 DIAGNOSIS — Z981 Arthrodesis status: Secondary | ICD-10-CM | POA: Diagnosis not present

## 2021-06-15 DIAGNOSIS — M4316 Spondylolisthesis, lumbar region: Secondary | ICD-10-CM | POA: Diagnosis not present

## 2021-06-15 DIAGNOSIS — M5116 Intervertebral disc disorders with radiculopathy, lumbar region: Secondary | ICD-10-CM | POA: Diagnosis not present

## 2021-06-15 DIAGNOSIS — M4726 Other spondylosis with radiculopathy, lumbar region: Secondary | ICD-10-CM | POA: Diagnosis not present

## 2021-06-22 DIAGNOSIS — R2689 Other abnormalities of gait and mobility: Secondary | ICD-10-CM | POA: Diagnosis not present

## 2021-06-22 DIAGNOSIS — M069 Rheumatoid arthritis, unspecified: Secondary | ICD-10-CM | POA: Diagnosis not present

## 2021-06-22 DIAGNOSIS — R5381 Other malaise: Secondary | ICD-10-CM | POA: Diagnosis not present

## 2021-06-22 DIAGNOSIS — M6281 Muscle weakness (generalized): Secondary | ICD-10-CM | POA: Diagnosis not present

## 2021-06-22 DIAGNOSIS — Z9889 Other specified postprocedural states: Secondary | ICD-10-CM | POA: Diagnosis not present

## 2021-06-22 DIAGNOSIS — M4316 Spondylolisthesis, lumbar region: Secondary | ICD-10-CM | POA: Diagnosis not present

## 2021-07-01 ENCOUNTER — Other Ambulatory Visit: Payer: Self-pay | Admitting: Internal Medicine

## 2021-07-01 ENCOUNTER — Other Ambulatory Visit: Payer: Self-pay | Admitting: Neurology

## 2021-07-06 ENCOUNTER — Encounter (HOSPITAL_BASED_OUTPATIENT_CLINIC_OR_DEPARTMENT_OTHER): Payer: Self-pay

## 2021-07-06 ENCOUNTER — Other Ambulatory Visit: Payer: Self-pay

## 2021-07-06 ENCOUNTER — Telehealth: Payer: Self-pay | Admitting: Internal Medicine

## 2021-07-06 ENCOUNTER — Emergency Department (HOSPITAL_BASED_OUTPATIENT_CLINIC_OR_DEPARTMENT_OTHER): Payer: Medicare Other

## 2021-07-06 ENCOUNTER — Emergency Department (HOSPITAL_BASED_OUTPATIENT_CLINIC_OR_DEPARTMENT_OTHER)
Admission: EM | Admit: 2021-07-06 | Discharge: 2021-07-06 | Disposition: A | Discharge status: Home / Self Care | Payer: Medicare Other | Attending: Emergency Medicine | Admitting: Emergency Medicine

## 2021-07-06 DIAGNOSIS — R109 Unspecified abdominal pain: Secondary | ICD-10-CM | POA: Diagnosis not present

## 2021-07-06 DIAGNOSIS — I10 Essential (primary) hypertension: Secondary | ICD-10-CM | POA: Diagnosis not present

## 2021-07-06 DIAGNOSIS — I700 Atherosclerosis of aorta: Secondary | ICD-10-CM | POA: Diagnosis not present

## 2021-07-06 DIAGNOSIS — K5903 Drug induced constipation: Secondary | ICD-10-CM | POA: Insufficient documentation

## 2021-07-06 DIAGNOSIS — Z7901 Long term (current) use of anticoagulants: Secondary | ICD-10-CM | POA: Diagnosis not present

## 2021-07-06 DIAGNOSIS — N3000 Acute cystitis without hematuria: Secondary | ICD-10-CM | POA: Insufficient documentation

## 2021-07-06 DIAGNOSIS — K5900 Constipation, unspecified: Secondary | ICD-10-CM | POA: Diagnosis present

## 2021-07-06 LAB — LIPASE, BLOOD: Lipase: <10 U/L — ABNORMAL LOW (ref 11–51)

## 2021-07-06 LAB — COMPREHENSIVE METABOLIC PANEL
ALT: 15 U/L (ref 0–44)
AST: 23 U/L (ref 15–41)
Albumin: 4 g/dL (ref 3.5–5.0)
Alkaline Phosphatase: 119 U/L (ref 38–126)
Anion gap: 12 (ref 5–15)
BUN: 10 mg/dL (ref 8–23)
CO2: 25 mmol/L (ref 22–32)
Calcium: 10.4 mg/dL — ABNORMAL HIGH (ref 8.9–10.3)
Chloride: 102 mmol/L (ref 98–111)
Creatinine, Ser: 0.82 mg/dL (ref 0.44–1.00)
GFR, Estimated: >60 mL/min (ref >60)
Glucose, Bld: 98 mg/dL (ref 70–99)
Potassium: 3.9 mmol/L (ref 3.5–5.1)
Sodium: 139 mmol/L (ref 135–145)
Total Bilirubin: 0.5 mg/dL (ref 0.3–1.2)
Total Protein: 8.7 g/dL — ABNORMAL HIGH (ref 6.5–8.1)

## 2021-07-06 LAB — URINALYSIS, ROUTINE W REFLEX MICROSCOPIC
Bilirubin Urine: NEGATIVE
Glucose, UA: NEGATIVE mg/dL
Hgb urine dipstick: NEGATIVE
Ketones, ur: 15 mg/dL — AB
Nitrite: NEGATIVE
Specific Gravity, Urine: 1.012 (ref 1.005–1.030)
WBC, UA: >50 WBC/hpf — ABNORMAL HIGH (ref 0–5)
pH: 8 (ref 5.0–8.0)

## 2021-07-06 LAB — CBC
HCT: 34.7 % — ABNORMAL LOW (ref 36.0–46.0)
Hemoglobin: 10.9 g/dL — ABNORMAL LOW (ref 12.0–15.0)
MCH: 27.6 pg (ref 26.0–34.0)
MCHC: 31.4 g/dL (ref 30.0–36.0)
MCV: 87.8 fL (ref 80.0–100.0)
Platelets: 439 10*3/uL — ABNORMAL HIGH (ref 150–400)
RBC: 3.95 MIL/uL (ref 3.87–5.11)
RDW: 15.1 % (ref 11.5–15.5)
WBC: 5.6 10*3/uL (ref 4.0–10.5)
nRBC: 0 % (ref 0.0–0.2)

## 2021-07-06 MED ORDER — CEPHALEXIN 500 MG PO CAPS: 500.0000 mg | ORAL_CAPSULE | Freq: Four times a day (QID) | ORAL | 0 refills | Status: DC

## 2021-07-06 MED ORDER — ONDANSETRON 4 MG PO TBDP
4.0000 mg | ORAL_TABLET | Freq: Once | ORAL | Status: AC
Start: 2021-07-06 — End: 2021-07-06
  Administered 2021-07-06: 4 mg via ORAL
  Filled 2021-07-06: qty 1

## 2021-07-06 MED ORDER — SODIUM CHLORIDE 0.9 % IV BOLUS
1000.0000 mL | Freq: Once | INTRAVENOUS | Status: AC
  Administered 2021-07-06: 1000 mL via INTRAVENOUS

## 2021-07-06 MED ORDER — ONDANSETRON HCL 4 MG PO TABS: 4.0000 mg | ORAL_TABLET | Freq: Four times a day (QID) | ORAL | 0 refills | Status: DC

## 2021-07-06 MED ORDER — SODIUM CHLORIDE 0.9 % IV SOLN
1.0000 g | Freq: Once | INTRAVENOUS | Status: AC
  Administered 2021-07-06: 1 g via INTRAVENOUS
  Filled 2021-07-06: qty 10

## 2021-07-06 MED ORDER — IOHEXOL 300 MG/ML  SOLN
100.0000 mL | Freq: Once | INTRAMUSCULAR | Status: AC | PRN
  Administered 2021-07-06: 80 mL via INTRAVENOUS

## 2021-07-06 NOTE — Discharge Instructions (Addendum)
Fortunately, your CT scan does not show any signs of obstruction or fecal impaction.  Since you are already taking 1 dose of MiraLAX a day, you can increase this to 2 doses once per day.  Fortunately, this medication is very safe and if you are not finding relief with even 2 doses you can increase it up to 3 or even 4 until you have a bowel movement and then scale it back down.  For faster relief, you can pick up a over-the-counter enema called Fleet enema and do this at home.  May return if you do not have improvement of symptoms or develop fevers.  Follow-up with your PCP as needed.

## 2021-07-06 NOTE — Telephone Encounter (Signed)
Pt called and said she is suffering constipation, she has been taking Miralax but has felt no relief. Pt stated Miralax is making her feel sick to her stomach and she's not able to eat.   Pt would like a callback with suggestions on what she can do or take for the constipation.  She can be reached at [phone] or [phone].

## 2021-07-06 NOTE — Telephone Encounter (Signed)
Currently in ED at [facility]

## 2021-07-06 NOTE — ED Provider Notes (Cosign Needed)
[facility] EMERGENCY DEPT Provider Note   CSN: [id number] Arrival date & time: 07/06/21  1124     History  Chief Complaint  Patient presents with   Constipation    Haley Roberts is a 76 y.o. female with history of hypertension, rheumatoid arthritis, prediabetes, previous DVT and back surgery in September with subsequent inability to walk who presents to the ED for evaluation of constipation.  She has been having very small bowel movements over the last few days.  Patient states that she is on chronic pain medications since her back surgery and often deals with constipation.  She states that she does have sensation of the lower extremities except for her bilateral feet and they are unsure about why she cannot walk.  She is supposed to take MiraLAX twice daily which usually helps her symptoms, however patient is at home with her husband who also has ALS and often has trouble remembering to take her second dose.  They do have a caretaker come to the home to help with some things but is usually not there when she needs her second dose of medication.  Patient denies abdominal pain but she endorses some nausea and just feeling "sick to her stomach".  She denies fevers, chills, chest pain, shortness of breath, increased urinary frequency.  She does state that she occasionally has burning when she pees but believes this is due to using her pull-ups and not from a urinary tract infection.  She has no prior history of impaction.   Constipation      Home Medications Prior to Admission medications   Medication Sig Start Date End Date Taking? Authorizing Provider  cephALEXin (KEFLEX) 500 MG capsule Take 1 capsule (500 mg total) by mouth 4 (four) times daily. 07/06/21  Yes Kathe Becton R, PA-C  ondansetron (ZOFRAN) 4 MG tablet Take 1 tablet (4 mg total) by mouth every 6 (six) hours. 07/06/21  Yes Kathe Becton R, PA-C  bethanechol (URECHOLINE) 10 MG tablet Take 1 tablet (10 mg  total) by mouth 4 (four) times daily. 03/02/21   Burns, Claudina Lick, MD  cyclobenzaprine (FLEXERIL) 10 MG tablet TAKE 1 TABLET(10 MG) BY MOUTH THREE TIMES DAILY AS NEEDED FOR MUSCLE SPASMS 06/01/21   Binnie Rail, MD  cyclobenzaprine (FLEXERIL) 5 MG tablet Take 5 mg by mouth 3 (three) times daily. 03/02/21   [provider]  ELIQUIS 2.5 MG TABS tablet TAKE 1 TABLET(2.5 MG) BY MOUTH TWICE DAILY 06/06/21   Binnie Rail, MD  escitalopram (LEXAPRO) 20 MG tablet Take 1 tablet (20 mg total) by mouth daily. 04/26/21   Binnie Rail, MD  HYDROcodone-acetaminophen (NORCO) 10-325 MG tablet Take 1 tablet by mouth every 6 (six) hours as needed. 03/23/21   [provider]  leflunomide (ARAVA) 10 MG tablet Take 1 tablet (10 mg total) by mouth daily. 03/02/21   Binnie Rail, MD  losartan (COZAAR) 25 MG tablet TAKE 1 TABLET(25 MG) BY MOUTH DAILY 04/26/21   Binnie Rail, MD  Multiple Vitamins-Minerals (WOMENS DAILY FORMULA) TABS Take 1 tablet by mouth daily.      [provider]  potassium chloride (KLOR-CON) 10 MEQ tablet TAKE 1 TABLET(10 MEQ) BY MOUTH DAILY 07/05/21   Burns, Claudina Lick, MD  predniSONE (DELTASONE) 5 MG tablet TAKE 1 TABLET(5 MG) BY MOUTH DAILY 06/01/21   Binnie Rail, MD  pregabalin (LYRICA) 75 MG capsule Take 1 capsule (75 mg total) by mouth at bedtime. 02/16/21 03/18/21  Alric Ran,  MD  vitamin C (ASCORBIC ACID) 500 MG tablet Take 500 mg by mouth daily.      [provider]  vitamin E 180 MG (400 UNITS) capsule take 1 capsule by oral route every day    [provider]  vitamin E 400 UNIT capsule Take 400 Units by mouth daily.      [provider]      Allergies    Infliximab, Sulfa antibiotics, Sulfacetamide sodium-sulfur, and Sulfonamide derivatives    Review of Systems   Review of Systems  Gastrointestinal:  Positive for constipation.    Physical Exam Updated Vital Signs BP (!) 173/99 (BP Location: Right Arm)   Pulse 91   Temp 99 F  (37.2 C) (Oral)   Resp 16   Ht '5\' 2"'$  (1.575 m)   Wt 66.1 kg   SpO2 96%   BMI 26.65 kg/m  Physical Exam Vitals and nursing note reviewed.  Constitutional:      General: She is not in acute distress.    Appearance: She is not ill-appearing.  HENT:     Head: Atraumatic.  Eyes:     Conjunctiva/sclera: Conjunctivae normal.  Cardiovascular:     Rate and Rhythm: Normal rate and regular rhythm.     Pulses: Normal pulses.     Heart sounds: No murmur heard. Pulmonary:     Effort: Pulmonary effort is normal. No respiratory distress.     Breath sounds: Normal breath sounds.  Abdominal:     General: Abdomen is flat. There is no distension.     Palpations: Abdomen is soft.     Tenderness: There is no abdominal tenderness. There is no right CVA tenderness, left CVA tenderness or guarding.  Genitourinary:    Comments: Sphincter tone intact.  No evidence of impaction with DRE although small amount of hard stool palpated. Musculoskeletal:        General: Normal range of motion.     Cervical back: Normal range of motion.  Skin:    General: Skin is warm and dry.     Capillary Refill: Capillary refill takes less than 2 seconds.  Neurological:     General: No focal deficit present.     Mental Status: She is alert.  Psychiatric:        Mood and Affect: Mood normal.     ED Results / Procedures / Treatments   Labs (all labs ordered are listed, but only abnormal results are displayed) Labs Reviewed  LIPASE, BLOOD - Abnormal; Notable for the following components:      Result Value   Lipase <10 (*)    All other components within normal limits  COMPREHENSIVE METABOLIC PANEL - Abnormal; Notable for the following components:   Calcium 10.4 (*)    Total Protein 8.7 (*)    All other components within normal limits  CBC - Abnormal; Notable for the following components:   Hemoglobin 10.9 (*)    HCT 34.7 (*)    Platelets 439 (*)    All other components within normal limits  URINALYSIS,  ROUTINE W REFLEX MICROSCOPIC - Abnormal; Notable for the following components:   APPearance HAZY (*)    Ketones, ur 15 (*)    Protein, ur TRACE (*)    Leukocytes,Ua LARGE (*)    WBC, UA >50 (*)    Bacteria, UA MANY (*)    All other components within normal limits    EKG None  Radiology CT ABDOMEN PELVIS W CONTRAST  Result Date: 07/06/2021  CLINICAL DATA:  Abdominal pain, nausea EXAM: CT ABDOMEN AND PELVIS WITH CONTRAST TECHNIQUE: Multidetector CT imaging of the abdomen and pelvis was performed using the standard protocol following bolus administration of intravenous contrast. RADIATION DOSE REDUCTION: This exam was performed according to the departmental dose-optimization program which includes automated exposure control, adjustment of the mA and/or kV according to patient size and/or use of iterative reconstruction technique. CONTRAST:  10m OMNIPAQUE IOHEXOL 300 MG/ML  SOLN COMPARISON:  None Available. FINDINGS: Lower chest: No acute abnormality.  Small hiatal hernia. Hepatobiliary: No solid liver abnormality is seen. No gallstones, gallbladder wall thickening, or biliary dilatation. Pancreas: Unremarkable. No pancreatic ductal dilatation or surrounding inflammatory changes. Spleen: Normal in size without significant abnormality. Adrenals/Urinary Tract: Adrenal glands are unremarkable. Kidneys are normal, without renal calculi, solid lesion, or hydronephrosis. Bladder is unremarkable. Stomach/Bowel: Stomach is within normal limits. Appendix appears normal. No evidence of bowel wall thickening, distention, or inflammatory changes. Sigmoid diverticula. Vascular/Lymphatic: Scattered aortic atherosclerosis. No enlarged abdominal or pelvic lymph nodes. Reproductive: Status post hysterectomy. Other: No abdominal wall hernia or abnormality. No ascites. Musculoskeletal: No acute osseous findings. Status post multilevel lumbar laminectomy and fusion, with overlying dorsal fluid collection, measuring 8.4 x  4.8 x 2.0 cm (series 2, image 35, series 5, image 71). IMPRESSION: 1. No acute CT findings of the abdomen or pelvis to explain abdominal pain. 2. Sigmoid diverticulosis without evidence of acute diverticulitis. 3. Status post multilevel lumbar laminectomy and fusion, with overlying dorsal fluid collection, measuring 8.4 x 4.8 x 2.0 cm. This most likely reflects a postoperative seroma, however the presence or absence of infection within this fluid is not established by CT. Aortic Atherosclerosis (ICD10-I70.0). Electronically Signed   By: ADelanna AhmadiM.D.   On: 07/06/2021 15:03    Procedures Procedures    Medications Ordered in ED Medications  iohexol (OMNIPAQUE) 300 MG/ML solution 100 mL (80 mLs Intravenous Contrast Given 07/06/21 1424)  sodium chloride 0.9 % bolus 1,000 mL (0 mLs Intravenous Stopped 07/06/21 1617)  cefTRIAXone (ROCEPHIN) 1 g in sodium chloride 0.9 % 100 mL IVPB (0 g Intravenous Stopped 07/06/21 1535)  ondansetron (ZOFRAN-ODT) disintegrating tablet 4 mg (4 mg Oral Given 07/06/21 1453)    ED Course/ Medical Decision Making/ A&P                           Medical Decision Making Amount and/or Complexity of Data Reviewed Labs: ordered. Radiology: ordered.  Risk Prescription drug management.   Social determinants of health:  Social History   Socioeconomic History   Marital status: Married    Spouse name: Not on file   Number of children: Not on file   Years of education: Not on file   Highest education level: Not on file  Occupational History   Not on file  Tobacco Use   Smoking status: Never   Smokeless tobacco: Never  Vaping Use   Vaping Use: Never used  Substance and Sexual Activity   Alcohol use: No   Drug use: No   Sexual activity: Not Currently  Other Topics Concern   Not on file  Social History Narrative   Reg exercise         Social Determinants of Health   Financial Resource Strain: Not on file  Food Insecurity: Not on file  Transportation  Needs: Not on file  Physical Activity: Not on file  Stress: Not on file  Social Connections: Not on file  Intimate Partner  Violence: Not on file     Initial impression:  This patient presents to the ED for concern of constipation, this involves an extensive number of treatment options, and is a complaint that carries with it a high risk of complications and morbidity.   Differentials include intestinal obstruction, ileus, medication side effect, urinary tract infection.   Comorbidities affecting care:  As in HPI  Additional history obtained: Phone call with PCP earlier today  Lab Tests  I Ordered, reviewed, and interpreted labs and EKG.  The pertinent results include:  CMP unremarkable CBC without leukocytosis Lipase normal UA with evidence of infection  Imaging Studies ordered:  I ordered imaging studies including  CT abdomen without acute findings I independently visualized and interpreted imaging and I agree with the radiologist interpretation.    Medicines ordered and prescription drug management:  I ordered medication including: 1 L normal saline bolus Rocephin 1 g Zofran 4 mg Reevaluation of the patient after these medicines showed that the patient improved I have reviewed the patients home medicines and have made adjustments as needed   ED Course/Re-evaluation: 77 year old female presents to the ED for evaluation of constipation.  She is overall well-appearing and in no acute distress.  Her vitals were without significant abnormality although blood pressure is slightly elevated.  On exam, patient has no abdominal distention or tenderness.  On rectal exam, she has intact sphincter tone and no evidence of impaction although small amounts of hard stools are palpated.  Labs are all normal except for urinalysis with significant urinary tract infection.  Given her age and history, I also obtain CT abdomen which showed no acute findings or evidence of fecal impaction or  SBO.  She was given 1 L normal saline along with 1 g Rocephin for her urinary tract infection.  I discussed with patient use of MiraLAX.  Reportedly she is post to be using MiraLAX twice daily but is not consistent about getting her second dose.  Advised to just take double the dose 1 time a day.  She can also titrate that up until she has a bowel movement for more rapid relief, she can do a Fleet enema.  We will also discharge home with Zofran and antibiotics for her UTI.  Patient expresses understanding is amenable to plan.  We discussed return precautions and she is understanding.  Disposition:  After consideration of the diagnostic results, physical exam, history and the patients response to treatment feel that the patent would benefit from discharge.   Drug-induced constipation Acute cystitis without hematuria: Plan and management as described above. Discharged home in good condition.  Final Clinical Impression(s) / ED Diagnoses Final diagnoses:  Drug-induced constipation  Acute cystitis without hematuria    Rx / DC Orders ED Discharge Orders          Ordered    cephALEXin (KEFLEX) 500 MG capsule  4 times daily        07/06/21 1531    ondansetron (ZOFRAN) 4 MG tablet  Every 6 hours        07/06/21 1531              Tonye Pearson, [place] 07/06/21 2225

## 2021-07-06 NOTE — ED Notes (Signed)
Helped pt get dressed and into wheelchair, taken home by son

## 2021-07-06 NOTE — ED Triage Notes (Signed)
Patient here POV from Home.  Endorses being Constipated since this Weekend. States she has had a decreased Appetite due to Same and relates her Constipation due to her Narcotic Pain Medication Use.  Moderate Nausea. No V/D. No New Pain. No Urinary Symptoms.   NAD Noted during Triage. A&Ox4. GCS 15. BIB Personal Wheelchair.

## 2021-07-07 ENCOUNTER — Encounter: Payer: Self-pay | Admitting: Internal Medicine

## 2021-07-07 NOTE — Telephone Encounter (Signed)
Verify Drug Registry For Pregabalin 75 Mg Capsule Last Filled: 06/01/2021 Quantity: 30 capsules for 30 days Last appointment: 02/16/2021 Next appointment: 08/18/2021

## 2021-07-11 ENCOUNTER — Other Ambulatory Visit: Payer: Self-pay | Admitting: Internal Medicine

## 2021-07-11 DIAGNOSIS — R5381 Other malaise: Secondary | ICD-10-CM

## 2021-07-11 DIAGNOSIS — M069 Rheumatoid arthritis, unspecified: Secondary | ICD-10-CM

## 2021-07-11 DIAGNOSIS — M4316 Spondylolisthesis, lumbar region: Secondary | ICD-10-CM

## 2021-07-13 MED ORDER — FLUCONAZOLE 150 MG PO TABS: 150.0000 mg | ORAL_TABLET | Freq: Once | ORAL | 0 refills | Status: AC

## 2021-07-14 DIAGNOSIS — Z9889 Other specified postprocedural states: Secondary | ICD-10-CM | POA: Diagnosis not present

## 2021-07-14 DIAGNOSIS — M6281 Muscle weakness (generalized): Secondary | ICD-10-CM | POA: Diagnosis not present

## 2021-07-14 DIAGNOSIS — R5381 Other malaise: Secondary | ICD-10-CM | POA: Diagnosis not present

## 2021-07-14 DIAGNOSIS — M069 Rheumatoid arthritis, unspecified: Secondary | ICD-10-CM | POA: Diagnosis not present

## 2021-07-14 DIAGNOSIS — R2689 Other abnormalities of gait and mobility: Secondary | ICD-10-CM | POA: Diagnosis not present

## 2021-07-14 DIAGNOSIS — M4316 Spondylolisthesis, lumbar region: Secondary | ICD-10-CM | POA: Diagnosis not present

## 2021-07-20 DIAGNOSIS — M4316 Spondylolisthesis, lumbar region: Secondary | ICD-10-CM | POA: Diagnosis not present

## 2021-07-20 DIAGNOSIS — R5381 Other malaise: Secondary | ICD-10-CM | POA: Diagnosis not present

## 2021-07-20 DIAGNOSIS — Z9889 Other specified postprocedural states: Secondary | ICD-10-CM | POA: Diagnosis not present

## 2021-07-20 DIAGNOSIS — M069 Rheumatoid arthritis, unspecified: Secondary | ICD-10-CM | POA: Diagnosis not present

## 2021-07-20 DIAGNOSIS — M6281 Muscle weakness (generalized): Secondary | ICD-10-CM | POA: Diagnosis not present

## 2021-07-20 DIAGNOSIS — R2689 Other abnormalities of gait and mobility: Secondary | ICD-10-CM | POA: Diagnosis not present

## 2021-07-22 DIAGNOSIS — R5381 Other malaise: Secondary | ICD-10-CM | POA: Diagnosis not present

## 2021-07-22 DIAGNOSIS — M069 Rheumatoid arthritis, unspecified: Secondary | ICD-10-CM | POA: Diagnosis not present

## 2021-07-22 DIAGNOSIS — M6281 Muscle weakness (generalized): Secondary | ICD-10-CM | POA: Diagnosis not present

## 2021-07-22 DIAGNOSIS — Z9889 Other specified postprocedural states: Secondary | ICD-10-CM | POA: Diagnosis not present

## 2021-07-22 DIAGNOSIS — M4316 Spondylolisthesis, lumbar region: Secondary | ICD-10-CM | POA: Diagnosis not present

## 2021-07-22 DIAGNOSIS — R2689 Other abnormalities of gait and mobility: Secondary | ICD-10-CM | POA: Diagnosis not present

## 2021-07-24 ENCOUNTER — Other Ambulatory Visit: Payer: Self-pay | Admitting: Internal Medicine

## 2021-07-25 ENCOUNTER — Other Ambulatory Visit: Payer: Self-pay | Admitting: Internal Medicine

## 2021-07-26 DIAGNOSIS — M6281 Muscle weakness (generalized): Secondary | ICD-10-CM | POA: Diagnosis not present

## 2021-07-26 DIAGNOSIS — Z9889 Other specified postprocedural states: Secondary | ICD-10-CM | POA: Diagnosis not present

## 2021-07-26 DIAGNOSIS — R2689 Other abnormalities of gait and mobility: Secondary | ICD-10-CM | POA: Diagnosis not present

## 2021-07-26 DIAGNOSIS — M069 Rheumatoid arthritis, unspecified: Secondary | ICD-10-CM | POA: Diagnosis not present

## 2021-07-26 DIAGNOSIS — M4316 Spondylolisthesis, lumbar region: Secondary | ICD-10-CM | POA: Diagnosis not present

## 2021-07-26 DIAGNOSIS — R5381 Other malaise: Secondary | ICD-10-CM | POA: Diagnosis not present

## 2021-07-28 DIAGNOSIS — Z9889 Other specified postprocedural states: Secondary | ICD-10-CM | POA: Diagnosis not present

## 2021-07-28 DIAGNOSIS — R2689 Other abnormalities of gait and mobility: Secondary | ICD-10-CM | POA: Diagnosis not present

## 2021-07-28 DIAGNOSIS — M6281 Muscle weakness (generalized): Secondary | ICD-10-CM | POA: Diagnosis not present

## 2021-07-28 DIAGNOSIS — R5381 Other malaise: Secondary | ICD-10-CM | POA: Diagnosis not present

## 2021-07-28 DIAGNOSIS — M069 Rheumatoid arthritis, unspecified: Secondary | ICD-10-CM | POA: Diagnosis not present

## 2021-07-28 DIAGNOSIS — M4316 Spondylolisthesis, lumbar region: Secondary | ICD-10-CM | POA: Diagnosis not present

## 2021-08-02 DIAGNOSIS — M069 Rheumatoid arthritis, unspecified: Secondary | ICD-10-CM | POA: Diagnosis not present

## 2021-08-02 DIAGNOSIS — Z9889 Other specified postprocedural states: Secondary | ICD-10-CM | POA: Diagnosis not present

## 2021-08-02 DIAGNOSIS — M4316 Spondylolisthesis, lumbar region: Secondary | ICD-10-CM | POA: Diagnosis not present

## 2021-08-02 DIAGNOSIS — R2689 Other abnormalities of gait and mobility: Secondary | ICD-10-CM | POA: Diagnosis not present

## 2021-08-02 DIAGNOSIS — M6281 Muscle weakness (generalized): Secondary | ICD-10-CM | POA: Diagnosis not present

## 2021-08-02 DIAGNOSIS — R5381 Other malaise: Secondary | ICD-10-CM | POA: Diagnosis not present

## 2021-08-04 DIAGNOSIS — M4316 Spondylolisthesis, lumbar region: Secondary | ICD-10-CM | POA: Diagnosis not present

## 2021-08-04 DIAGNOSIS — M069 Rheumatoid arthritis, unspecified: Secondary | ICD-10-CM | POA: Diagnosis not present

## 2021-08-04 DIAGNOSIS — R2689 Other abnormalities of gait and mobility: Secondary | ICD-10-CM | POA: Diagnosis not present

## 2021-08-04 DIAGNOSIS — Z9889 Other specified postprocedural states: Secondary | ICD-10-CM | POA: Diagnosis not present

## 2021-08-04 DIAGNOSIS — R5381 Other malaise: Secondary | ICD-10-CM | POA: Diagnosis not present

## 2021-08-04 DIAGNOSIS — M6281 Muscle weakness (generalized): Secondary | ICD-10-CM | POA: Diagnosis not present

## 2021-08-10 ENCOUNTER — Encounter: Payer: Self-pay | Admitting: Internal Medicine

## 2021-08-11 ENCOUNTER — Other Ambulatory Visit: Payer: Self-pay

## 2021-08-11 ENCOUNTER — Telehealth: Payer: Self-pay | Admitting: Internal Medicine

## 2021-08-11 DIAGNOSIS — R300 Dysuria: Secondary | ICD-10-CM

## 2021-08-11 MED ORDER — AMOXICILLIN-POT CLAVULANATE 875-125 MG PO TABS: 1.0000 | ORAL_TABLET | Freq: Two times a day (BID) | ORAL | 0 refills | Status: AC

## 2021-08-11 MED ORDER — HYDROCODONE-ACETAMINOPHEN 10-325 MG PO TABS: 1.0000 | ORAL_TABLET | Freq: Four times a day (QID) | ORAL | 0 refills | Status: DC | PRN

## 2021-08-11 NOTE — Telephone Encounter (Signed)
Augmentin sent to [facility]

## 2021-08-11 NOTE — Telephone Encounter (Signed)
Pt son Ysidro Evert called and advised [place] to reach out to him with any questions about this request as he handles all of his mothers appointments. He can be reached at [phone].    FYI

## 2021-08-11 NOTE — Telephone Encounter (Signed)
Pt is requesting an antibiotic for a lingering UTI. Pt stated she was seen in the ED for the UTI and was prescribed a rx. Pt stated she is feelings the uti symptoms again. She stated she is in a wheelchair and is unable to drive so she can't come into the office.  She stated if she is unable to get an antibiotic sent in then she would like a call with some advice on what she can do.  Please advise.

## 2021-08-12 ENCOUNTER — Telehealth: Payer: Medicare Other | Admitting: Internal Medicine

## 2021-08-16 DIAGNOSIS — R5381 Other malaise: Secondary | ICD-10-CM | POA: Diagnosis not present

## 2021-08-16 DIAGNOSIS — M6281 Muscle weakness (generalized): Secondary | ICD-10-CM | POA: Diagnosis not present

## 2021-08-16 DIAGNOSIS — Z9889 Other specified postprocedural states: Secondary | ICD-10-CM | POA: Diagnosis not present

## 2021-08-16 DIAGNOSIS — M069 Rheumatoid arthritis, unspecified: Secondary | ICD-10-CM | POA: Diagnosis not present

## 2021-08-16 DIAGNOSIS — R2689 Other abnormalities of gait and mobility: Secondary | ICD-10-CM | POA: Diagnosis not present

## 2021-08-16 DIAGNOSIS — M4316 Spondylolisthesis, lumbar region: Secondary | ICD-10-CM | POA: Diagnosis not present

## 2021-08-18 ENCOUNTER — Ambulatory Visit: Payer: Medicare Other | Admitting: Neurology

## 2021-08-18 ENCOUNTER — Encounter: Payer: Self-pay | Admitting: Neurology

## 2021-08-18 DIAGNOSIS — M069 Rheumatoid arthritis, unspecified: Secondary | ICD-10-CM | POA: Diagnosis not present

## 2021-08-18 DIAGNOSIS — R2689 Other abnormalities of gait and mobility: Secondary | ICD-10-CM | POA: Diagnosis not present

## 2021-08-18 DIAGNOSIS — Z9889 Other specified postprocedural states: Secondary | ICD-10-CM | POA: Diagnosis not present

## 2021-08-18 DIAGNOSIS — R5381 Other malaise: Secondary | ICD-10-CM | POA: Diagnosis not present

## 2021-08-18 DIAGNOSIS — M6281 Muscle weakness (generalized): Secondary | ICD-10-CM | POA: Diagnosis not present

## 2021-08-18 DIAGNOSIS — M4316 Spondylolisthesis, lumbar region: Secondary | ICD-10-CM | POA: Diagnosis not present

## 2021-08-23 DIAGNOSIS — Z9889 Other specified postprocedural states: Secondary | ICD-10-CM | POA: Diagnosis not present

## 2021-08-23 DIAGNOSIS — M069 Rheumatoid arthritis, unspecified: Secondary | ICD-10-CM | POA: Diagnosis not present

## 2021-08-23 DIAGNOSIS — R5381 Other malaise: Secondary | ICD-10-CM | POA: Diagnosis not present

## 2021-08-23 DIAGNOSIS — R2689 Other abnormalities of gait and mobility: Secondary | ICD-10-CM | POA: Diagnosis not present

## 2021-08-23 DIAGNOSIS — M4316 Spondylolisthesis, lumbar region: Secondary | ICD-10-CM | POA: Diagnosis not present

## 2021-08-23 DIAGNOSIS — M6281 Muscle weakness (generalized): Secondary | ICD-10-CM | POA: Diagnosis not present

## 2021-08-24 ENCOUNTER — Encounter: Payer: Self-pay | Admitting: Internal Medicine

## 2021-08-24 DIAGNOSIS — M7989 Other specified soft tissue disorders: Secondary | ICD-10-CM | POA: Insufficient documentation

## 2021-08-24 NOTE — Progress Notes (Unsigned)
    Subjective:    Patient ID: Haley Roberts, female    DOB: 31-Aug-1945, 76 y.o.   MRN: [id number]      HPI Haley Roberts is here for No chief complaint on file.   ? UTI -    Left leg swelling -     Medications and allergies reviewed with patient and updated if appropriate.  Current Outpatient Medications on File Prior to Visit  Medication Sig Dispense Refill   bethanechol (URECHOLINE) 10 MG tablet TAKE 1 TABLET(10 MG) BY MOUTH FOUR TIMES DAILY 360 tablet 1   cyclobenzaprine (FLEXERIL) 10 MG tablet TAKE 1 TABLET(10 MG) BY MOUTH THREE TIMES DAILY AS NEEDED FOR MUSCLE SPASMS 90 tablet 0   cyclobenzaprine (FLEXERIL) 5 MG tablet Take 5 mg by mouth 3 (three) times daily.     ELIQUIS 2.5 MG TABS tablet TAKE 1 TABLET(2.5 MG) BY MOUTH TWICE DAILY 60 tablet 0   escitalopram (LEXAPRO) 20 MG tablet Take 1 tablet (20 mg total) by mouth daily. 90 tablet 3   HYDROcodone-acetaminophen (NORCO) 10-325 MG tablet Take 1 tablet by mouth every 6 (six) hours as needed. 120 tablet 0   leflunomide (ARAVA) 10 MG tablet Take 1 tablet (10 mg total) by mouth daily. 30 tablet 0   losartan (COZAAR) 25 MG tablet TAKE 1 TABLET(25 MG) BY MOUTH DAILY 90 tablet 3   Multiple Vitamins-Minerals (WOMENS DAILY FORMULA) TABS Take 1 tablet by mouth daily.       ondansetron (ZOFRAN) 4 MG tablet Take 1 tablet (4 mg total) by mouth every 6 (six) hours. 12 tablet 0   potassium chloride (KLOR-CON) 10 MEQ tablet TAKE 1 TABLET(10 MEQ) BY MOUTH DAILY 90 tablet 3   predniSONE (DELTASONE) 5 MG tablet TAKE 1 TABLET(5 MG) BY MOUTH DAILY 30 tablet 0   pregabalin (LYRICA) 75 MG capsule TAKE 1 CAPSULE(75 MG) BY MOUTH AT BEDTIME 30 capsule 3   vitamin C (ASCORBIC ACID) 500 MG tablet Take 500 mg by mouth daily.       vitamin E 180 MG (400 UNITS) capsule take 1 capsule by oral route every day     vitamin E 400 UNIT capsule Take 400 Units by mouth daily.       No current facility-administered medications on file prior to visit.     Review of Systems     Objective:  There were no vitals filed for this visit. BP Readings from Last 3 Encounters:  07/06/21 (!) 173/99  04/26/21 (!) 142/78  02/16/21 (!) 158/88   Wt Readings from Last 3 Encounters:  07/06/21 145 lb 11.6 oz (66.1 kg)  12/08/20 145 lb 12.8 oz (66.1 kg)  09/24/20 145 lb 8.1 oz (66 kg)   There is no height or weight on file to calculate BMI.    Physical Exam         Assessment & Plan:    See Problem List for Assessment and Plan of chronic medical problems.

## 2021-08-25 ENCOUNTER — Encounter: Payer: Self-pay | Admitting: Internal Medicine

## 2021-08-25 ENCOUNTER — Ambulatory Visit (INDEPENDENT_AMBULATORY_CARE_PROVIDER_SITE_OTHER): Payer: Medicare Other | Admitting: Internal Medicine

## 2021-08-25 ENCOUNTER — Telehealth: Payer: Self-pay | Admitting: Oncology

## 2021-08-25 ENCOUNTER — Ambulatory Visit (HOSPITAL_COMMUNITY)
Admission: RE | Admit: 2021-08-25 | Discharge: 2021-08-25 | Disposition: A | Discharge status: Home / Self Care | Payer: Medicare Other | Source: Ambulatory Visit | Attending: Cardiology | Admitting: Cardiology

## 2021-08-25 ENCOUNTER — Encounter (HOSPITAL_COMMUNITY): Payer: Self-pay | Admitting: Radiology

## 2021-08-25 VITALS — BP 150/98 | HR 93 | Temp 98.4°F | Ht 62.0 in

## 2021-08-25 DIAGNOSIS — I10 Essential (primary) hypertension: Secondary | ICD-10-CM

## 2021-08-25 DIAGNOSIS — M7989 Other specified soft tissue disorders: Secondary | ICD-10-CM | POA: Diagnosis not present

## 2021-08-25 DIAGNOSIS — I82402 Acute embolism and thrombosis of unspecified deep veins of left lower extremity: Secondary | ICD-10-CM | POA: Insufficient documentation

## 2021-08-25 DIAGNOSIS — R29898 Other symptoms and signs involving the musculoskeletal system: Secondary | ICD-10-CM | POA: Diagnosis not present

## 2021-08-25 DIAGNOSIS — M21371 Foot drop, right foot: Secondary | ICD-10-CM | POA: Diagnosis not present

## 2021-08-25 MED ORDER — APIXABAN (ELIQUIS) VTE STARTER PACK (10MG AND 5MG): ORAL_TABLET | ORAL | 0 refills | Status: DC

## 2021-08-25 NOTE — Patient Instructions (Signed)
     [place]  - 10:45 at [address]     Medications changes include :   none

## 2021-08-25 NOTE — Assessment & Plan Note (Signed)
B/l ankle weakness R > L Has R drop foot and L ankle weakness Needs b/l AFO orthosis for both ankles to aid in ambulation

## 2021-08-25 NOTE — Progress Notes (Unsigned)
Left lower venous has been completed and is positive for DVT.  Preliminary results can be found under CV Procedure tab through chart review.   Gwendola Hornaday, RVT

## 2021-08-25 NOTE — Assessment & Plan Note (Signed)
Chronic BP elevated here today - has whitecoat htn in doctor's offices BP controlled at home - PT checking it regularly No change in medication

## 2021-08-25 NOTE — Assessment & Plan Note (Signed)
Acute Started 2 weeks ago - no pain No injury No obvious cause H/o RLE DVT Concern for DVT Start [place] today -- confirmed DVT eliquis starter pak started

## 2021-08-25 NOTE — Assessment & Plan Note (Signed)
Came in today with left leg swelling x 2 weeks - no injury H/o RLE DVT 2022 [place] today confirmed DVT in LLE Start eliquis starter pak - then 5 mg bid x 3-6 months then likely needs lifelong a/c given two different DVT events at 2.5 mg bid Will refer to hem/onc for their opinion on long term a/c

## 2021-08-25 NOTE — Assessment & Plan Note (Signed)
Chronic Started after back surgery Limiting her ability to ambulate - working with PT AFO for right foot/ankle is medically necessary - due to left ankle weakness also needs AFO for left ankle Continue PT

## 2021-08-25 NOTE — Telephone Encounter (Signed)
Scheduled appt per 8/10 referral. Pt is aware of appt date and time. Pt is aware to arrive 15 mins prior to appt time and to bring and updated insurance card. Pt is aware of appt location.   

## 2021-08-29 ENCOUNTER — Encounter: Payer: Self-pay | Admitting: Internal Medicine

## 2021-08-30 DIAGNOSIS — Z9889 Other specified postprocedural states: Secondary | ICD-10-CM | POA: Diagnosis not present

## 2021-08-30 DIAGNOSIS — M6281 Muscle weakness (generalized): Secondary | ICD-10-CM | POA: Diagnosis not present

## 2021-08-30 DIAGNOSIS — R2689 Other abnormalities of gait and mobility: Secondary | ICD-10-CM | POA: Diagnosis not present

## 2021-08-30 DIAGNOSIS — M069 Rheumatoid arthritis, unspecified: Secondary | ICD-10-CM | POA: Diagnosis not present

## 2021-08-30 DIAGNOSIS — M4316 Spondylolisthesis, lumbar region: Secondary | ICD-10-CM | POA: Diagnosis not present

## 2021-08-30 DIAGNOSIS — R5381 Other malaise: Secondary | ICD-10-CM | POA: Diagnosis not present

## 2021-08-30 NOTE — Telephone Encounter (Signed)
MD is out of the office this week. Pls advise on email.Marland KitchenJohny Chess

## 2021-09-06 DIAGNOSIS — Z9889 Other specified postprocedural states: Secondary | ICD-10-CM | POA: Diagnosis not present

## 2021-09-06 DIAGNOSIS — R5381 Other malaise: Secondary | ICD-10-CM | POA: Diagnosis not present

## 2021-09-06 DIAGNOSIS — M4316 Spondylolisthesis, lumbar region: Secondary | ICD-10-CM | POA: Diagnosis not present

## 2021-09-06 DIAGNOSIS — R2689 Other abnormalities of gait and mobility: Secondary | ICD-10-CM | POA: Diagnosis not present

## 2021-09-06 DIAGNOSIS — M6281 Muscle weakness (generalized): Secondary | ICD-10-CM | POA: Diagnosis not present

## 2021-09-06 DIAGNOSIS — M069 Rheumatoid arthritis, unspecified: Secondary | ICD-10-CM | POA: Diagnosis not present

## 2021-09-07 NOTE — Progress Notes (Unsigned)
[facility] Initial Visit:  Patient Care Team: Binnie Rail, MD as PCP - General (Internal Medicine)  CHIEF COMPLAINTS/PURPOSE OF CONSULTATION:  Oncology History   No history exists.    HISTORY OF PRESENTING ILLNESS: Haley Roberts 76 y.o. female is here because of  LLE DVT.   Patient has RA diagnosed in about 2017.  Treated with infliximab in the past (has allergy).  RA currently being treated with Leflunomide.   Underwent right knee replacement 2012 and left in Jun 4481 without complication of VTE.     August 20 2020:  MRI Lumbar spine.  L4-5: Advanced facet arthropathy with degenerative anterolisthesis Severe multifactorial spinal stenosis which could cause neural compression in the central canal as well as in both neural foramina.  L5-S1: Facet arthropathy with degenerative anterolisthesis of 5 mm.  Narrowing of the subarticular lateral recesses and of the intervertebral foramen on the left. Left L5 nerve could be compressed.  L3-4: Facet arthropathy with 3 mm of anterolisthesis. Mild stenosis of the lateral recesses and neural foramina could possibly be symptomatic.   September 15 2020:  Lumbar spine surgery.  Lumbar Three-Four, Lumbar Four-Five Posterior lumbar interbody fusion Interbody titanium cages packed with autograft Segmental pedicle screw fixation L3-5, nuvasive relign Laminectomy L3, L4, in excess of the needed exposure for a Plif L3/4,4/5 .  Prior to lumbar spine surgery patient had fall and fractured right medial femoral condyle  Following spine surgery was discharged to inpatient rehab for 4 months.  She was unable to walk.  Her rehab course was complicated by development of DVT in RLE which was diagnosed in November 2022 after surveillance by Doppler.  Patient was treated with 6 months of Eliqui without bleeding problems.    October 21 2020:  Office visit for nondisplaced right medial femoral condyle periprosthetic fracture with stable and apparent  healing of this fracture.  This was noted after a fall   July 06 2021:  CT-scan of abdomen and pelvis   No acute findings explain abdominal pain.  Sigmoid diverticulosis without acute diverticulitis.    Status post multilevel lumbar laminectomy and fusion, with overlying dorsal fluid collection, measuring 8.4 x 4.8 x 2.0 cm. Most likely postoperative seroma  August 25 2021:  Presented with 2 weeks of increase swelling and tightness in the left lower leg.  Doppler U/S Bilateral LE's.  Right negative for DVT.  Left showed acute DVT involving left popliteal vein, left posterior tibial veins, left peroneal veins, and tibioperoneal confluence and saphenopopliteal junction.  Age indeterminate superficial vein thrombosis involving the left small saphenous vein.  September 09 2021:  [facility] Hematology Consult Reviewed relevant medical history.  RA has been active, even prior to surgery despite current therapy and it is manifested by swelling in hands, elbows.  Feet have been numb since surgery.  She has generalized arthralgias.  Has not been on steroids.    No family history of VTE.  Before November 2022 no prior history of VTE.    Review of Systems  Constitutional:  Negative for chills, fatigue, fever and unexpected weight change.  HENT:   Negative for mouth sores, nosebleeds, sore throat and trouble swallowing.   Eyes:  Negative for eye problems.       Has cataracts  Respiratory:  Negative for chest tightness, cough, hemoptysis and shortness of breath.   Cardiovascular:  Negative for chest pain, leg swelling and palpitations.  Gastrointestinal:  Negative for abdominal pain, blood in stool, constipation, diarrhea  and nausea.  Genitourinary:  Negative for difficulty urinating, dysuria, frequency and hematuria.   Musculoskeletal:  Positive for arthralgias, back pain, gait problem and myalgias.  Neurological:  Positive for extremity weakness and gait problem. Negative for headaches and seizures.        Has numbness in feet.  Wheelchair bound due to neuropathy  Hematological:  Negative for adenopathy. Does not bruise/bleed easily.    MEDICAL HISTORY: Past Medical History:  Diagnosis Date   Anemia    Anxiety    Depression    Fibromyalgia    Hyperglycemia    HYPERGLYCEMIA, FASTING 08/22/2007   Hypertension    Multiple thyroid nodules    gets ultra sound yearly on thryoid   Rheumatoid arthritis(714.0)     SURGICAL HISTORY: Past Surgical History:  Procedure Laterality Date   ABDOMINAL HYSTERECTOMY     with BSO   BREAST BIOPSY     JOINT REPLACEMENT     right knee replacement 2012   TOTAL KNEE ARTHROPLASTY Left 07/07/2014   Procedure: LEFT TOTAL KNEE ARTHROPLASTY;  Surgeon: Paralee Cancel, MD;  Location: [facility];  Service: Orthopedics;  Laterality: Left;    SOCIAL HISTORY: Social History   Socioeconomic History   Marital status: Married    Spouse name: Not on file   Number of children: Not on file   Years of education: Not on file   Highest education level: Not on file  Occupational History   Not on file  Tobacco Use   Smoking status: Never   Smokeless tobacco: Never  Vaping Use   Vaping Use: Never used  Substance and Sexual Activity   Alcohol use: No   Drug use: No   Sexual activity: Not Currently  Other Topics Concern   Not on file  Social History Narrative   Reg exercise         Social Determinants of Health   Financial Resource Strain: Not on file  Food Insecurity: Not on file  Transportation Needs: Not on file  Physical Activity: Not on file  Stress: Not on file  Social Connections: Not on file  Intimate Partner Violence: Not on file    FAMILY HISTORY Family History  Problem Relation Age of Onset   Cancer Mother 75       breast   Arthritis Father    Hypertension Father    Diabetes Sister    Hypertension Brother    Diabetes Son    Depression Son     ALLERGIES:  is allergic to infliximab, sulfa antibiotics, sulfacetamide sodium-sulfur, and  sulfonamide derivatives.  MEDICATIONS:  Current Outpatient Medications  Medication Sig Dispense Refill   APIXABAN (ELIQUIS) VTE STARTER PACK ('10MG'$  AND '5MG'$ ) Take as directed on package: start with two-'5mg'$  tablets twice daily for 7 days. On day 8, switch to one-'5mg'$  tablet twice daily. 1 each 0   cyclobenzaprine (FLEXERIL) 10 MG tablet TAKE 1 TABLET(10 MG) BY MOUTH THREE TIMES DAILY AS NEEDED FOR MUSCLE SPASMS 90 tablet 0   cyclobenzaprine (FLEXERIL) 5 MG tablet Take 5 mg by mouth 3 (three) times daily.     escitalopram (LEXAPRO) 20 MG tablet Take 1 tablet (20 mg total) by mouth daily. 90 tablet 3   HYDROcodone-acetaminophen (NORCO) 10-325 MG tablet Take 1 tablet by mouth every 6 (six) hours as needed. 120 tablet 0   leflunomide (ARAVA) 10 MG tablet Take 1 tablet (10 mg total) by mouth daily. 30 tablet 0   losartan (COZAAR) 25 MG tablet TAKE 1 TABLET(25 MG) BY  MOUTH DAILY 90 tablet 3   Multiple Vitamins-Minerals (WOMENS DAILY FORMULA) TABS Take 1 tablet by mouth daily.       ondansetron (ZOFRAN) 4 MG tablet Take 1 tablet (4 mg total) by mouth every 6 (six) hours. 12 tablet 0   potassium chloride (KLOR-CON) 10 MEQ tablet TAKE 1 TABLET(10 MEQ) BY MOUTH DAILY 90 tablet 3   predniSONE (DELTASONE) 5 MG tablet TAKE 1 TABLET(5 MG) BY MOUTH DAILY 30 tablet 0   pregabalin (LYRICA) 75 MG capsule TAKE 1 CAPSULE(75 MG) BY MOUTH AT BEDTIME 30 capsule 3   vitamin C (ASCORBIC ACID) 500 MG tablet Take 500 mg by mouth daily.       vitamin E 180 MG (400 UNITS) capsule take 1 capsule by oral route every day     vitamin E 400 UNIT capsule Take 400 Units by mouth daily.       No current facility-administered medications for this visit.    PHYSICAL EXAMINATION:  ECOG PERFORMANCE STATUS: {CHL ONC ECOG PS:[phone]}   There were no vitals filed for this visit.  There were no vitals filed for this visit.   Physical Exam Vitals and nursing note reviewed.  Constitutional:      General: She is not in acute  distress.    Appearance: She is not ill-appearing, toxic-appearing or diaphoretic.     Comments: Here with aid.  Seated in wheelchair  HENT:     Head: Normocephalic.     Nose: No congestion or rhinorrhea.     Mouth/Throat:     Mouth: Mucous membranes are dry.     Pharynx: Oropharynx is clear.  Eyes:     General: No scleral icterus.    Extraocular Movements: Extraocular movements intact.     Conjunctiva/sclera: Conjunctivae normal.     Pupils: Pupils are equal, round, and reactive to light.  Cardiovascular:     Rate and Rhythm: Normal rate and regular rhythm.     Heart sounds: Normal heart sounds. No murmur heard.    No friction rub. No gallop.  Pulmonary:     Effort: Pulmonary effort is normal. No respiratory distress.     Breath sounds: Normal breath sounds. No stridor. No wheezing, rhonchi or rales.  Abdominal:     General: Bowel sounds are normal. There is no distension.     Palpations: Abdomen is soft. There is no mass.     Tenderness: There is no guarding or rebound.  Musculoskeletal:        General: Deformity present.     Cervical back: Normal range of motion.     Right lower leg: Edema present.     Left lower leg: Edema present.     Comments: Seated in wheelchair Swan neck deformities of hands.  Thickening of wrists  Lymphadenopathy:     Cervical: No cervical adenopathy.  Skin:    General: Skin is warm and dry.     Coloration: Skin is not jaundiced or pale.     Findings: No bruising.  Neurological:     Mental Status: She is alert and oriented to person, place, and time.     Cranial Nerves: No cranial nerve deficit.     Motor: Weakness present.     Comments: Seated in wheelchair  Psychiatric:        Mood and Affect: Mood normal.        Behavior: Behavior normal.        Thought Content: Thought content normal.  Judgment: Judgment normal.      LABORATORY DATA: I have personally reviewed the data as listed:  No visits with results within 1 Month(s) from  this visit.  Latest known visit with results is:  Admission on 07/06/2021, Discharged on 07/06/2021  Component Date Value Ref Range Status   Lipase 07/06/2021 <10 (L)  11 - 51 U/L Final   Performed at [facility], [place], [place] [address]   Sodium 07/06/2021 139  135 - 145 mmol/L Final   Potassium 07/06/2021 3.9  3.5 - 5.1 mmol/L Final   Chloride 07/06/2021 102  98 - 111 mmol/L Final   CO2 07/06/2021 25  22 - 32 mmol/L Final   Glucose, Bld 07/06/2021 98  70 - 99 mg/dL Final   Glucose reference range applies only to samples taken after fasting for at least 8 hours.   BUN 07/06/2021 10  8 - 23 mg/dL Final   Creatinine, Ser 07/06/2021 0.82  0.44 - 1.00 mg/dL Final   Calcium 07/06/2021 10.4 (H)  8.9 - 10.3 mg/dL Final   Total Protein 07/06/2021 8.7 (H)  6.5 - 8.1 g/dL Final   Albumin 07/06/2021 4.0  3.5 - 5.0 g/dL Final   AST 07/06/2021 23  15 - 41 U/L Final   ALT 07/06/2021 15  0 - 44 U/L Final   Alkaline Phosphatase 07/06/2021 119  38 - 126 U/L Final   Total Bilirubin 07/06/2021 0.5  0.3 - 1.2 mg/dL Final   GFR, Estimated 07/06/2021 >60  >60 mL/min Final   Comment: (NOTE) Calculated using the CKD-EPI Creatinine Equation (2021)    Anion gap 07/06/2021 12  5 - 15 Final   Performed at [facility], [address], [place], [place] [address]   WBC 07/06/2021 5.6  4.0 - 10.5 K/uL Final   RBC 07/06/2021 3.95  3.87 - 5.11 MIL/uL Final   Hemoglobin 07/06/2021 10.9 (L)  12.0 - 15.0 g/dL Final   HCT 07/06/2021 34.7 (L)  36.0 - 46.0 % Final   MCV 07/06/2021 87.8  80.0 - 100.0 fL Final   MCH 07/06/2021 27.6  26.0 - 34.0 pg Final   MCHC 07/06/2021 31.4  30.0 - 36.0 g/dL Final   RDW 07/06/2021 15.1  11.5 - 15.5 % Final   Platelets 07/06/2021 439 (H)  150 - 400 K/uL Final   nRBC 07/06/2021 0.0  0.0 - 0.2 % Final   Performed at [facility], [address] [place], [place] [address]   Color, Urine 07/06/2021 YELLOW   YELLOW Final   APPearance 07/06/2021 HAZY (A)  CLEAR Final   Specific Gravity, Urine 07/06/2021 1.012  1.005 - 1.030 Final   pH 07/06/2021 8.0  5.0 - 8.0 Final   Glucose, UA 07/06/2021 NEGATIVE  NEGATIVE mg/dL Final   Hgb urine dipstick 07/06/2021 NEGATIVE  NEGATIVE Final   Bilirubin Urine 07/06/2021 NEGATIVE  NEGATIVE Final   Ketones, ur 07/06/2021 15 (A)  NEGATIVE mg/dL Final   Protein, ur 07/06/2021 TRACE (A)  NEGATIVE mg/dL Final   Nitrite 07/06/2021 NEGATIVE  NEGATIVE Final   Leukocytes,Ua 07/06/2021 LARGE (A)  NEGATIVE Final   RBC / HPF 07/06/2021 6-10  0 - 5 RBC/hpf Final   WBC, UA 07/06/2021 >50 (H)  0 - 5 WBC/hpf Final   Bacteria, UA 07/06/2021 MANY (A)  NONE SEEN Final   Squamous Epithelial / LPF 07/06/2021 0-5  0 - 5 Final   Performed at [facility], [address] [place],   [address]    RADIOGRAPHIC STUDIES: I have personally reviewed the radiological images as listed and agree with the findings in the report  No results found.  ASSESSMENT/PLAN   VTE:   Would consider these as being provoked by inflammation and immobility due to RA Received a 6 month course of anticoagulation following the first event (RLE DVT) diagnosed in November 2022.  This was reasonable however she developed an LLE DVT following discontinuation of Eliquis.  Fortunately she did not develop an extensive LE DVT nor did she develop a PE.  Eliquis was appropriately restated following the diagnosis of the LLE DVT.  Would continue full dose Eliquis for 6 months and then assess if can decrease Eliquis dose do a prophylactic level.  Patient has ongoing risk factors for redevelopment of VTE in the form of active RA and immobility.   Hypercoagulable state evaluation:  Will check for antiphospholipid antibodies since the presence of these could alter management.    Cancer Staging  No matching staging information was found for the patient.   No problem-specific Assessment & Plan  notes found for this encounter.   No orders of the defined types were placed in this encounter.   All questions were answered. The patient knows to call the clinic with any problems, questions or concerns.  This note was electronically signed.    Barbee Cough, MD  09/07/2021 10:22 AM

## 2021-09-09 ENCOUNTER — Inpatient Hospital Stay: Payer: Medicare Other

## 2021-09-09 ENCOUNTER — Other Ambulatory Visit: Payer: Self-pay

## 2021-09-09 ENCOUNTER — Inpatient Hospital Stay: Payer: Medicare Other | Attending: Oncology | Admitting: Oncology

## 2021-09-09 VITALS — BP 153/97 | HR 94 | Temp 97.8°F | Resp 18 | Ht 62.0 in

## 2021-09-09 DIAGNOSIS — I82433 Acute embolism and thrombosis of popliteal vein, bilateral: Secondary | ICD-10-CM | POA: Diagnosis not present

## 2021-09-09 DIAGNOSIS — M069 Rheumatoid arthritis, unspecified: Secondary | ICD-10-CM | POA: Insufficient documentation

## 2021-09-09 DIAGNOSIS — Z7901 Long term (current) use of anticoagulants: Secondary | ICD-10-CM | POA: Diagnosis not present

## 2021-09-09 DIAGNOSIS — Z993 Dependence on wheelchair: Secondary | ICD-10-CM | POA: Diagnosis not present

## 2021-09-09 DIAGNOSIS — Z803 Family history of malignant neoplasm of breast: Secondary | ICD-10-CM | POA: Diagnosis not present

## 2021-09-09 DIAGNOSIS — I82409 Acute embolism and thrombosis of unspecified deep veins of unspecified lower extremity: Secondary | ICD-10-CM | POA: Insufficient documentation

## 2021-09-09 DIAGNOSIS — Z79899 Other long term (current) drug therapy: Secondary | ICD-10-CM | POA: Insufficient documentation

## 2021-09-09 DIAGNOSIS — Z96651 Presence of right artificial knee joint: Secondary | ICD-10-CM | POA: Diagnosis not present

## 2021-09-09 DIAGNOSIS — D6859 Other primary thrombophilia: Secondary | ICD-10-CM | POA: Insufficient documentation

## 2021-09-09 DIAGNOSIS — I82563 Chronic embolism and thrombosis of calf muscular vein, bilateral: Secondary | ICD-10-CM

## 2021-09-09 DIAGNOSIS — I82493 Acute embolism and thrombosis of other specified deep vein of lower extremity, bilateral: Secondary | ICD-10-CM | POA: Insufficient documentation

## 2021-09-09 DIAGNOSIS — I82443 Acute embolism and thrombosis of tibial vein, bilateral: Secondary | ICD-10-CM | POA: Insufficient documentation

## 2021-09-09 DIAGNOSIS — G629 Polyneuropathy, unspecified: Secondary | ICD-10-CM | POA: Insufficient documentation

## 2021-09-09 DIAGNOSIS — D6869 Other thrombophilia: Secondary | ICD-10-CM | POA: Insufficient documentation

## 2021-09-09 DIAGNOSIS — I82453 Acute embolism and thrombosis of peroneal vein, bilateral: Secondary | ICD-10-CM | POA: Insufficient documentation

## 2021-09-09 DIAGNOSIS — Z86718 Personal history of other venous thrombosis and embolism: Secondary | ICD-10-CM | POA: Insufficient documentation

## 2021-09-09 LAB — CBC WITH DIFFERENTIAL (CANCER CENTER ONLY)
Abs Immature Granulocytes: 0.04 10*3/uL (ref 0.00–0.07)
Basophils Absolute: 0.1 10*3/uL (ref 0.0–0.1)
Basophils Relative: 1 %
Eosinophils Absolute: 0.2 10*3/uL (ref 0.0–0.5)
Eosinophils Relative: 4 %
HCT: 31.5 % — ABNORMAL LOW (ref 36.0–46.0)
Hemoglobin: 10 g/dL — ABNORMAL LOW (ref 12.0–15.0)
Immature Granulocytes: 1 %
Lymphocytes Relative: 25 %
Lymphs Abs: 1.4 10*3/uL (ref 0.7–4.0)
MCH: 28.2 pg (ref 26.0–34.0)
MCHC: 31.7 g/dL (ref 30.0–36.0)
MCV: 88.7 fL (ref 80.0–100.0)
Monocytes Absolute: 0.6 10*3/uL (ref 0.1–1.0)
Monocytes Relative: 11 %
Neutro Abs: 3.2 10*3/uL (ref 1.7–7.7)
Neutrophils Relative %: 58 %
Platelet Count: 435 10*3/uL — ABNORMAL HIGH (ref 150–400)
RBC: 3.55 MIL/uL — ABNORMAL LOW (ref 3.87–5.11)
RDW: 15.8 % — ABNORMAL HIGH (ref 11.5–15.5)
WBC Count: 5.4 10*3/uL (ref 4.0–10.5)
nRBC: 0 % (ref 0.0–0.2)

## 2021-09-09 LAB — CMP (CANCER CENTER ONLY)
ALT: 12 U/L (ref 0–44)
AST: 18 U/L (ref 15–41)
Albumin: 3.7 g/dL (ref 3.5–5.0)
Alkaline Phosphatase: 132 U/L — ABNORMAL HIGH (ref 38–126)
Anion gap: 7 (ref 5–15)
BUN: 21 mg/dL (ref 8–23)
CO2: 27 mmol/L (ref 22–32)
Calcium: 9.1 mg/dL (ref 8.9–10.3)
Chloride: 106 mmol/L (ref 98–111)
Creatinine: 0.77 mg/dL (ref 0.44–1.00)
GFR, Estimated: >60 mL/min (ref >60)
Glucose, Bld: 118 mg/dL — ABNORMAL HIGH (ref 70–99)
Potassium: 3.9 mmol/L (ref 3.5–5.1)
Sodium: 140 mmol/L (ref 135–145)
Total Bilirubin: 0.3 mg/dL (ref 0.3–1.2)
Total Protein: 7.8 g/dL (ref 6.5–8.1)

## 2021-09-09 LAB — PROTIME-INR
INR: 1.1 (ref 0.8–1.2)
Prothrombin Time: 14 seconds (ref 11.4–15.2)

## 2021-09-09 LAB — APTT: aPTT: 29 seconds (ref 24–36)

## 2021-09-09 LAB — C-REACTIVE PROTEIN: CRP: 0.9 mg/dL (ref <1.0)

## 2021-09-09 LAB — D-DIMER, QUANTITATIVE: D-Dimer, Quant: 2.07 ug/mL-FEU — ABNORMAL HIGH (ref 0.00–0.50)

## 2021-09-09 LAB — FIBRINOGEN: Fibrinogen: 486 mg/dL — ABNORMAL HIGH (ref 210–475)

## 2021-09-11 LAB — BETA-2-GLYCOPROTEIN I ABS, IGG/M/A
Beta-2 Glyco I IgG: <9 GPI IgG units (ref 0–20)
Beta-2-Glycoprotein I IgA: <9 GPI IgA units (ref 0–25)
Beta-2-Glycoprotein I IgM: <9 GPI IgM units (ref 0–32)

## 2021-09-11 LAB — CARDIOLIPIN ANTIBODIES, IGM+IGG
Anticardiolipin IgG: <9 GPL U/mL (ref 0–14)
Anticardiolipin IgM: <9 MPL U/mL (ref 0–12)

## 2021-09-12 ENCOUNTER — Telehealth: Payer: Self-pay | Admitting: Oncology

## 2021-09-12 LAB — FACTOR 8 ASSAY: Coagulation Factor VIII: 230 % — ABNORMAL HIGH (ref 56–140)

## 2021-09-12 NOTE — Telephone Encounter (Signed)
Scheduled appt per 8/25 los. Called pt, no answer. Left msg with appt date/time.

## 2021-09-13 DIAGNOSIS — R2689 Other abnormalities of gait and mobility: Secondary | ICD-10-CM | POA: Diagnosis not present

## 2021-09-13 DIAGNOSIS — M6281 Muscle weakness (generalized): Secondary | ICD-10-CM | POA: Diagnosis not present

## 2021-09-13 DIAGNOSIS — R5381 Other malaise: Secondary | ICD-10-CM | POA: Diagnosis not present

## 2021-09-13 DIAGNOSIS — Z9889 Other specified postprocedural states: Secondary | ICD-10-CM | POA: Diagnosis not present

## 2021-09-13 DIAGNOSIS — M4316 Spondylolisthesis, lumbar region: Secondary | ICD-10-CM | POA: Diagnosis not present

## 2021-09-13 DIAGNOSIS — M069 Rheumatoid arthritis, unspecified: Secondary | ICD-10-CM | POA: Diagnosis not present

## 2021-09-15 DIAGNOSIS — M069 Rheumatoid arthritis, unspecified: Secondary | ICD-10-CM | POA: Diagnosis not present

## 2021-09-15 DIAGNOSIS — Z9889 Other specified postprocedural states: Secondary | ICD-10-CM | POA: Diagnosis not present

## 2021-09-15 DIAGNOSIS — M6281 Muscle weakness (generalized): Secondary | ICD-10-CM | POA: Diagnosis not present

## 2021-09-15 DIAGNOSIS — R2689 Other abnormalities of gait and mobility: Secondary | ICD-10-CM | POA: Diagnosis not present

## 2021-09-15 DIAGNOSIS — R5381 Other malaise: Secondary | ICD-10-CM | POA: Diagnosis not present

## 2021-09-15 DIAGNOSIS — M4316 Spondylolisthesis, lumbar region: Secondary | ICD-10-CM | POA: Diagnosis not present

## 2021-09-20 DIAGNOSIS — M069 Rheumatoid arthritis, unspecified: Secondary | ICD-10-CM | POA: Diagnosis not present

## 2021-09-20 DIAGNOSIS — M4316 Spondylolisthesis, lumbar region: Secondary | ICD-10-CM | POA: Diagnosis not present

## 2021-09-20 DIAGNOSIS — M6281 Muscle weakness (generalized): Secondary | ICD-10-CM | POA: Diagnosis not present

## 2021-09-20 DIAGNOSIS — R5381 Other malaise: Secondary | ICD-10-CM | POA: Diagnosis not present

## 2021-09-20 DIAGNOSIS — R2689 Other abnormalities of gait and mobility: Secondary | ICD-10-CM | POA: Diagnosis not present

## 2021-09-20 DIAGNOSIS — Z9889 Other specified postprocedural states: Secondary | ICD-10-CM | POA: Diagnosis not present

## 2021-09-22 ENCOUNTER — Other Ambulatory Visit: Payer: Self-pay | Admitting: Internal Medicine

## 2021-09-22 DIAGNOSIS — R2689 Other abnormalities of gait and mobility: Secondary | ICD-10-CM | POA: Diagnosis not present

## 2021-09-22 DIAGNOSIS — M6281 Muscle weakness (generalized): Secondary | ICD-10-CM | POA: Diagnosis not present

## 2021-09-22 DIAGNOSIS — Z9889 Other specified postprocedural states: Secondary | ICD-10-CM | POA: Diagnosis not present

## 2021-09-22 DIAGNOSIS — M069 Rheumatoid arthritis, unspecified: Secondary | ICD-10-CM | POA: Diagnosis not present

## 2021-09-22 DIAGNOSIS — M4316 Spondylolisthesis, lumbar region: Secondary | ICD-10-CM | POA: Diagnosis not present

## 2021-09-22 DIAGNOSIS — R5381 Other malaise: Secondary | ICD-10-CM | POA: Diagnosis not present

## 2021-09-27 ENCOUNTER — Telehealth: Payer: Self-pay | Admitting: Internal Medicine

## 2021-09-28 ENCOUNTER — Telehealth: Payer: Self-pay | Admitting: Licensed Clinical Social Worker

## 2021-09-28 NOTE — Patient Outreach (Signed)
  Care Coordination   09/28/2021 Name: Haley Roberts MRN: [id number] DOB: 1945-09-12   Care Coordination Outreach Attempts:  An unsuccessful telephone outreach was attempted today to offer the patient information about available care coordination services as a benefit of their health plan.   Follow Up Plan:  Additional outreach attempts will be made to offer the patient care coordination information and services.   Encounter Outcome:  No Answer  Care Coordination Interventions Activated:  No   Care Coordination Interventions:  No, not indicated    Casimer Lanius, [place] [phone]

## 2021-09-29 DIAGNOSIS — M6281 Muscle weakness (generalized): Secondary | ICD-10-CM | POA: Diagnosis not present

## 2021-09-29 DIAGNOSIS — M069 Rheumatoid arthritis, unspecified: Secondary | ICD-10-CM | POA: Diagnosis not present

## 2021-09-29 DIAGNOSIS — M4316 Spondylolisthesis, lumbar region: Secondary | ICD-10-CM | POA: Diagnosis not present

## 2021-09-29 DIAGNOSIS — Z9889 Other specified postprocedural states: Secondary | ICD-10-CM | POA: Diagnosis not present

## 2021-09-29 DIAGNOSIS — R5381 Other malaise: Secondary | ICD-10-CM | POA: Diagnosis not present

## 2021-09-29 DIAGNOSIS — R2689 Other abnormalities of gait and mobility: Secondary | ICD-10-CM | POA: Diagnosis not present

## 2021-10-03 MED ORDER — PREDNISONE 5 MG PO TABS: ORAL_TABLET | ORAL | 0 refills | Status: DC

## 2021-10-03 NOTE — Telephone Encounter (Signed)
Called and left message for patient's son and on machine with info.

## 2021-10-03 NOTE — Telephone Encounter (Signed)
Let son or patient know this ideally should be coming from Dr. Amil Amen.  I did send in 30 days into the pharmacy.  Also want to make sure she is still taking the Eliquis which she needs to be taking for 6 months since she was just diagnosed with another blood clot last month.

## 2021-10-03 NOTE — Addendum Note (Signed)
Addended by: Binnie Rail on: 10/03/2021 12:40 PM   Modules accepted: Orders

## 2021-10-03 NOTE — Telephone Encounter (Signed)
Patient's son called requesting that this script be filled.

## 2021-10-04 DIAGNOSIS — M6281 Muscle weakness (generalized): Secondary | ICD-10-CM | POA: Diagnosis not present

## 2021-10-04 DIAGNOSIS — R5381 Other malaise: Secondary | ICD-10-CM | POA: Diagnosis not present

## 2021-10-04 DIAGNOSIS — Z9889 Other specified postprocedural states: Secondary | ICD-10-CM | POA: Diagnosis not present

## 2021-10-04 DIAGNOSIS — R2689 Other abnormalities of gait and mobility: Secondary | ICD-10-CM | POA: Diagnosis not present

## 2021-10-04 DIAGNOSIS — M069 Rheumatoid arthritis, unspecified: Secondary | ICD-10-CM | POA: Diagnosis not present

## 2021-10-04 DIAGNOSIS — M4316 Spondylolisthesis, lumbar region: Secondary | ICD-10-CM | POA: Diagnosis not present

## 2021-10-06 DIAGNOSIS — R5381 Other malaise: Secondary | ICD-10-CM | POA: Diagnosis not present

## 2021-10-06 DIAGNOSIS — Z9889 Other specified postprocedural states: Secondary | ICD-10-CM | POA: Diagnosis not present

## 2021-10-06 DIAGNOSIS — M6281 Muscle weakness (generalized): Secondary | ICD-10-CM | POA: Diagnosis not present

## 2021-10-06 DIAGNOSIS — R2689 Other abnormalities of gait and mobility: Secondary | ICD-10-CM | POA: Diagnosis not present

## 2021-10-06 DIAGNOSIS — M069 Rheumatoid arthritis, unspecified: Secondary | ICD-10-CM | POA: Diagnosis not present

## 2021-10-06 DIAGNOSIS — M4316 Spondylolisthesis, lumbar region: Secondary | ICD-10-CM | POA: Diagnosis not present

## 2021-10-13 ENCOUNTER — Telehealth: Payer: Self-pay

## 2021-10-13 DIAGNOSIS — Z9889 Other specified postprocedural states: Secondary | ICD-10-CM | POA: Diagnosis not present

## 2021-10-13 DIAGNOSIS — M6281 Muscle weakness (generalized): Secondary | ICD-10-CM | POA: Diagnosis not present

## 2021-10-13 DIAGNOSIS — M4316 Spondylolisthesis, lumbar region: Secondary | ICD-10-CM | POA: Diagnosis not present

## 2021-10-13 DIAGNOSIS — M069 Rheumatoid arthritis, unspecified: Secondary | ICD-10-CM | POA: Diagnosis not present

## 2021-10-13 DIAGNOSIS — R5381 Other malaise: Secondary | ICD-10-CM | POA: Diagnosis not present

## 2021-10-13 DIAGNOSIS — R2689 Other abnormalities of gait and mobility: Secondary | ICD-10-CM | POA: Diagnosis not present

## 2021-10-13 MED ORDER — HYDROCODONE-ACETAMINOPHEN 10-325 MG PO TABS: 1.0000 | ORAL_TABLET | Freq: Four times a day (QID) | ORAL | 0 refills | Status: DC | PRN

## 2021-10-13 NOTE — Addendum Note (Signed)
Addended by: Binnie Rail on: 10/13/2021 10:42 AM   Modules accepted: Orders

## 2021-10-13 NOTE — Telephone Encounter (Signed)
MEDICATION: HYDROcodone-acetaminophen (NORCO) 10-325 MG tablet  PHARMACY: [id number] - , [place] - [address] AT [address]  Comments: Patient has 1 pill left.   **Let patient know to contact pharmacy at the end of the day to make sure medication is ready. **  ** Please notify patient to allow 48-72 hours to process**  **Encourage patient to contact the pharmacy for refills or they can request refills through [facility]**

## 2021-10-13 NOTE — Telephone Encounter (Signed)
Medication sent to pharmacy  

## 2021-10-14 MED ORDER — HYDROCODONE-ACETAMINOPHEN 5-325 MG PO TABS: 1.0000 | ORAL_TABLET | Freq: Four times a day (QID) | ORAL | 0 refills | Status: DC | PRN

## 2021-10-14 NOTE — Telephone Encounter (Signed)
sent 

## 2021-10-14 NOTE — Addendum Note (Signed)
Addended by: Binnie Rail on: 10/14/2021 09:19 AM   Modules accepted: Orders

## 2021-10-14 NOTE — Telephone Encounter (Signed)
[facility] does not have the '10mg'$  of hydrocodone - they will need a new prescription for the 5.'0mg'$  in stock.  Please send this script in this morning

## 2021-10-18 DIAGNOSIS — R2689 Other abnormalities of gait and mobility: Secondary | ICD-10-CM | POA: Diagnosis not present

## 2021-10-18 DIAGNOSIS — R5381 Other malaise: Secondary | ICD-10-CM | POA: Diagnosis not present

## 2021-10-18 DIAGNOSIS — M4316 Spondylolisthesis, lumbar region: Secondary | ICD-10-CM | POA: Diagnosis not present

## 2021-10-18 DIAGNOSIS — M069 Rheumatoid arthritis, unspecified: Secondary | ICD-10-CM | POA: Diagnosis not present

## 2021-10-18 DIAGNOSIS — Z9889 Other specified postprocedural states: Secondary | ICD-10-CM | POA: Diagnosis not present

## 2021-10-18 DIAGNOSIS — M6281 Muscle weakness (generalized): Secondary | ICD-10-CM | POA: Diagnosis not present

## 2021-10-20 DIAGNOSIS — M069 Rheumatoid arthritis, unspecified: Secondary | ICD-10-CM | POA: Diagnosis not present

## 2021-10-20 DIAGNOSIS — M4316 Spondylolisthesis, lumbar region: Secondary | ICD-10-CM | POA: Diagnosis not present

## 2021-10-20 DIAGNOSIS — R5381 Other malaise: Secondary | ICD-10-CM | POA: Diagnosis not present

## 2021-10-20 DIAGNOSIS — Z9889 Other specified postprocedural states: Secondary | ICD-10-CM | POA: Diagnosis not present

## 2021-10-20 DIAGNOSIS — M6281 Muscle weakness (generalized): Secondary | ICD-10-CM | POA: Diagnosis not present

## 2021-10-20 DIAGNOSIS — R2689 Other abnormalities of gait and mobility: Secondary | ICD-10-CM | POA: Diagnosis not present

## 2021-10-25 DIAGNOSIS — M6281 Muscle weakness (generalized): Secondary | ICD-10-CM | POA: Diagnosis not present

## 2021-10-25 DIAGNOSIS — R2689 Other abnormalities of gait and mobility: Secondary | ICD-10-CM | POA: Diagnosis not present

## 2021-10-25 DIAGNOSIS — Z9889 Other specified postprocedural states: Secondary | ICD-10-CM | POA: Diagnosis not present

## 2021-10-25 DIAGNOSIS — R5381 Other malaise: Secondary | ICD-10-CM | POA: Diagnosis not present

## 2021-10-25 DIAGNOSIS — M4316 Spondylolisthesis, lumbar region: Secondary | ICD-10-CM | POA: Diagnosis not present

## 2021-10-25 DIAGNOSIS — M069 Rheumatoid arthritis, unspecified: Secondary | ICD-10-CM | POA: Diagnosis not present

## 2021-10-26 ENCOUNTER — Other Ambulatory Visit: Payer: Self-pay | Admitting: Internal Medicine

## 2021-10-27 DIAGNOSIS — Z9889 Other specified postprocedural states: Secondary | ICD-10-CM | POA: Diagnosis not present

## 2021-10-27 DIAGNOSIS — M069 Rheumatoid arthritis, unspecified: Secondary | ICD-10-CM | POA: Diagnosis not present

## 2021-10-27 DIAGNOSIS — R2689 Other abnormalities of gait and mobility: Secondary | ICD-10-CM | POA: Diagnosis not present

## 2021-10-27 DIAGNOSIS — M6281 Muscle weakness (generalized): Secondary | ICD-10-CM | POA: Diagnosis not present

## 2021-10-27 DIAGNOSIS — M4316 Spondylolisthesis, lumbar region: Secondary | ICD-10-CM | POA: Diagnosis not present

## 2021-10-27 DIAGNOSIS — R5381 Other malaise: Secondary | ICD-10-CM | POA: Diagnosis not present

## 2021-11-01 DIAGNOSIS — R2689 Other abnormalities of gait and mobility: Secondary | ICD-10-CM | POA: Diagnosis not present

## 2021-11-01 DIAGNOSIS — Z9889 Other specified postprocedural states: Secondary | ICD-10-CM | POA: Diagnosis not present

## 2021-11-01 DIAGNOSIS — M4316 Spondylolisthesis, lumbar region: Secondary | ICD-10-CM | POA: Diagnosis not present

## 2021-11-01 DIAGNOSIS — R5381 Other malaise: Secondary | ICD-10-CM | POA: Diagnosis not present

## 2021-11-01 DIAGNOSIS — M6281 Muscle weakness (generalized): Secondary | ICD-10-CM | POA: Diagnosis not present

## 2021-11-01 DIAGNOSIS — M069 Rheumatoid arthritis, unspecified: Secondary | ICD-10-CM | POA: Diagnosis not present

## 2021-11-02 ENCOUNTER — Encounter: Payer: Self-pay | Admitting: Internal Medicine

## 2021-11-07 ENCOUNTER — Encounter: Payer: Self-pay | Admitting: *Deleted

## 2021-11-08 DIAGNOSIS — M6281 Muscle weakness (generalized): Secondary | ICD-10-CM | POA: Diagnosis not present

## 2021-11-08 DIAGNOSIS — R5381 Other malaise: Secondary | ICD-10-CM | POA: Diagnosis not present

## 2021-11-08 DIAGNOSIS — Z9889 Other specified postprocedural states: Secondary | ICD-10-CM | POA: Diagnosis not present

## 2021-11-08 DIAGNOSIS — M069 Rheumatoid arthritis, unspecified: Secondary | ICD-10-CM | POA: Diagnosis not present

## 2021-11-08 DIAGNOSIS — M4316 Spondylolisthesis, lumbar region: Secondary | ICD-10-CM | POA: Diagnosis not present

## 2021-11-08 DIAGNOSIS — R2689 Other abnormalities of gait and mobility: Secondary | ICD-10-CM | POA: Diagnosis not present

## 2021-11-09 ENCOUNTER — Other Ambulatory Visit: Payer: Self-pay | Admitting: Neurology

## 2021-11-09 ENCOUNTER — Other Ambulatory Visit: Payer: Self-pay | Admitting: Internal Medicine

## 2021-11-10 DIAGNOSIS — Z9889 Other specified postprocedural states: Secondary | ICD-10-CM | POA: Diagnosis not present

## 2021-11-10 DIAGNOSIS — R2689 Other abnormalities of gait and mobility: Secondary | ICD-10-CM | POA: Diagnosis not present

## 2021-11-10 DIAGNOSIS — R5381 Other malaise: Secondary | ICD-10-CM | POA: Diagnosis not present

## 2021-11-10 DIAGNOSIS — M069 Rheumatoid arthritis, unspecified: Secondary | ICD-10-CM | POA: Diagnosis not present

## 2021-11-10 DIAGNOSIS — M6281 Muscle weakness (generalized): Secondary | ICD-10-CM | POA: Diagnosis not present

## 2021-11-10 DIAGNOSIS — M4316 Spondylolisthesis, lumbar region: Secondary | ICD-10-CM | POA: Diagnosis not present

## 2021-11-15 DIAGNOSIS — R2689 Other abnormalities of gait and mobility: Secondary | ICD-10-CM | POA: Diagnosis not present

## 2021-11-15 DIAGNOSIS — Z9889 Other specified postprocedural states: Secondary | ICD-10-CM | POA: Diagnosis not present

## 2021-11-15 DIAGNOSIS — R5381 Other malaise: Secondary | ICD-10-CM | POA: Diagnosis not present

## 2021-11-15 DIAGNOSIS — M069 Rheumatoid arthritis, unspecified: Secondary | ICD-10-CM | POA: Diagnosis not present

## 2021-11-15 DIAGNOSIS — M4316 Spondylolisthesis, lumbar region: Secondary | ICD-10-CM | POA: Diagnosis not present

## 2021-11-15 DIAGNOSIS — M6281 Muscle weakness (generalized): Secondary | ICD-10-CM | POA: Diagnosis not present

## 2021-11-16 ENCOUNTER — Other Ambulatory Visit: Payer: Self-pay | Admitting: Neurology

## 2021-11-17 NOTE — Progress Notes (Signed)
Subjective:    Patient ID: Haley Roberts, female    DOB: 1945-11-25, 76 y.o.   MRN: [id number]      HPI Haley Roberts is here for  Chief Complaint  Patient presents with   Leg Swelling    Still have swelling and hard places noted in the left leg   RLE DVT 11/2020 - in rehab x 4 months after spine surgery - eliquis x 6 months  LLE DVT 08/2021 - eliquis restarted - likely related to imobility and inflammation from RA  Has seen onc - will likely dec dose to proph dose after 6 months     Left lower leg more hard-she thinks this is been there for a while.   Swelling not worse.  No redness.  No pain - just regular pain..  n/t since prior to surgery no change. No warmth or fever in leg.  No fevers or chills.  PT 2/week-she is only walking while in physical therapy.  She does not walk at home, but hopefully will be able to start that soon.  She is in the wheelchair most of the day-she does elevate her legs.  She is not wearing compression socks yet-she tried some but they were not comfortable and she is planning on getting new ones.  Medications and allergies reviewed with patient and updated if appropriate.  Current Outpatient Medications on File Prior to Visit  Medication Sig Dispense Refill   APIXABAN (ELIQUIS) VTE STARTER PACK ('10MG'$  AND '5MG'$ ) Take as directed on package: start with two-'5mg'$  tablets twice daily for 7 days. On day 8, switch to one-'5mg'$  tablet twice daily. 1 each 0   buPROPion (WELLBUTRIN XL) 150 MG 24 hr tablet 1 tablet in the morning Orally Once a day     ELIQUIS 2.5 MG TABS tablet TAKE 1 TABLET(2.5 MG) BY MOUTH TWICE DAILY 60 tablet 0   escitalopram (LEXAPRO) 20 MG tablet Take 1 tablet (20 mg total) by mouth daily. 90 tablet 3   HYDROcodone-acetaminophen (NORCO) 10-325 MG tablet Take 1 tablet by mouth every 6 (six) hours as needed. 120 tablet 0   HYDROcodone-acetaminophen (NORCO/VICODIN) 5-325 MG tablet Take 1-2 tablets by mouth every 6 (six) hours as needed for  moderate pain. 240 tablet 0   leflunomide (ARAVA) 10 MG tablet Take 1 tablet (10 mg total) by mouth daily. 30 tablet 0   losartan (COZAAR) 25 MG tablet TAKE 1 TABLET(25 MG) BY MOUTH DAILY 90 tablet 3   Multiple Vitamins-Minerals (WOMENS DAILY FORMULA) TABS Take 1 tablet by mouth daily.       ondansetron (ZOFRAN) 4 MG tablet Take 1 tablet (4 mg total) by mouth every 6 (six) hours. 12 tablet 0   potassium chloride (KLOR-CON) 10 MEQ tablet TAKE 1 TABLET(10 MEQ) BY MOUTH DAILY 90 tablet 3   predniSONE (DELTASONE) 5 MG tablet TAKE 1 TABLET(5 MG) BY MOUTH DAILY 90 tablet 0   pregabalin (LYRICA) 75 MG capsule TAKE 1 CAPSULE(75 MG) BY MOUTH AT BEDTIME 30 capsule 3   vitamin C (ASCORBIC ACID) 500 MG tablet Take 500 mg by mouth daily.       vitamin E 180 MG (400 UNITS) capsule take 1 capsule by oral route every day     vitamin E 400 UNIT capsule Take 400 Units by mouth daily.       cyclobenzaprine (FLEXERIL) 5 MG tablet Take 5 mg by mouth 3 (three) times daily. (Patient not taking: Reported on 11/18/2021)     No current facility-administered  medications on file prior to visit.    Review of Systems  Constitutional:  Negative for chills and fever.  Respiratory:  Negative for shortness of breath.   Cardiovascular:  Positive for leg swelling. Negative for chest pain and palpitations.       Objective:   Vitals:   11/18/21 1040  BP: (!) 144/90  Pulse: 84  Temp: 98.3 F (36.8 C)  SpO2: 97%   BP Readings from Last 3 Encounters:  11/18/21 (!) 144/90  09/09/21 (!) 153/97  08/25/21 (!) 150/98   Wt Readings from Last 3 Encounters:  07/06/21 145 lb 11.6 oz (66.1 kg)  12/08/20 145 lb 12.8 oz (66.1 kg)  09/24/20 145 lb 8.1 oz (66 kg)   Body mass index is 26.65 kg/m.    Physical Exam Constitutional:      General: She is not in acute distress.    Appearance: Normal appearance. She is not ill-appearing.  HENT:     Head: Normocephalic and atraumatic.  Musculoskeletal:     Right lower leg:  Edema (1+, nonpitting halfway up lower leg-skin is soft and without chronic skin changes) present.     Left lower leg: Edema (1+2/3 way up leg-skin is is tight and hard-no erythema or skin breaks) present.  Skin:    General: Skin is warm and dry.     Findings: No erythema or rash.  Neurological:     Mental Status: She is alert.            Assessment & Plan:    See Problem List for Assessment and Plan of chronic medical problems.

## 2021-11-18 ENCOUNTER — Ambulatory Visit (INDEPENDENT_AMBULATORY_CARE_PROVIDER_SITE_OTHER): Payer: Medicare Other | Admitting: Internal Medicine

## 2021-11-18 ENCOUNTER — Encounter: Payer: Self-pay | Admitting: Internal Medicine

## 2021-11-18 VITALS — BP 144/90 | HR 84 | Temp 98.3°F | Ht 62.0 in

## 2021-11-18 DIAGNOSIS — Z23 Encounter for immunization: Secondary | ICD-10-CM | POA: Diagnosis not present

## 2021-11-18 DIAGNOSIS — R600 Localized edema: Secondary | ICD-10-CM

## 2021-11-18 MED ORDER — FUROSEMIDE 20 MG PO TABS: 20.0000 mg | ORAL_TABLET | Freq: Every day | ORAL | 5 refills | Status: DC | PRN

## 2021-11-18 MED ORDER — CYCLOBENZAPRINE HCL 10 MG PO TABS: ORAL_TABLET | ORAL | 0 refills | Status: DC

## 2021-11-18 NOTE — Assessment & Plan Note (Signed)
Chronic Has bilateral leg swelling-history of bilateral DVTs at 2 different times Left leg swelling is worse than right, but no change recently-left lower leg skin is firm-no evidence of infection We will get ultrasound just to make sure clot has not gotten worse but she has been religious with the Eliquis so I do not think that is an issue She is not walking on a regular basis-only twice a week and is sitting most of the day so that likely is the cause in addition to venous insufficiency that is multifactorial She will get new compression socks and try to wear them daily Elevate legs whenever possible She is doing foot and leg exercises at home.  Hopefully she will be able to start walking at home soon Trial of Lasix 20 mg daily as needed-she does not need to take this on a regular basis and may just see how much it helps with the swelling and we can go from there

## 2021-11-18 NOTE — Patient Instructions (Addendum)
     Flu immunization administered today.     Medications changes include :   lasix 20 mg daily prn    A bilateral Ultrasound was ordered.     Someone will call you to schedule an appointment.

## 2021-11-21 ENCOUNTER — Encounter (HOSPITAL_COMMUNITY): Payer: Medicare Other

## 2021-11-22 MED ORDER — PREGABALIN 75 MG PO CAPS: ORAL_CAPSULE | ORAL | 0 refills | Status: DC

## 2021-11-22 NOTE — Patient Instructions (Signed)

## 2021-11-22 NOTE — Progress Notes (Unsigned)
Subjective:   Haley Roberts is a 76 y.o. female who presents for an Initial Medicare Annual Wellness Visit. I connected with  Apolline Dimitroff Schneller on 11/22/21 by a audio enabled telemedicine application and verified that I am speaking with the correct person using two identifiers.  Patient Location: Home  Provider Location: Home Office  I discussed the limitations of evaluation and management by telemedicine. The patient expressed understanding and agreed to proceed.  Review of Systems    Deferred to PCP       Objective:    There were no vitals filed for this visit. There is no height or weight on file to calculate BMI.     07/06/2021   11:49 AM 09/17/2020    3:38 PM 09/10/2020    7:51 AM 09/05/2020    4:06 PM 07/07/2014    3:08 PM 07/01/2014    2:21 PM  Advanced Directives  Does Patient Have a Medical Advance Directive? No Yes Yes No Yes Yes  Type of Special educational needs teacher of [place];Living will Healthcare Power of [place];Living will  Healthcare Power of [place];Living will Healthcare Power of [place];Living will  Does patient want to make changes to medical advance directive?  No - Patient declined No - Patient declined  No - Patient declined No - Patient declined  Copy of Healthcare Power of Attorney in Chart?  No - copy requested No - copy requested  No - copy requested No - copy requested  Would patient like information on creating a medical advance directive? No - Patient declined No - Patient declined No - Patient declined No - Patient declined      Current Medications (verified) Outpatient Encounter Medications as of 11/23/2021  Medication Sig   APIXABAN (ELIQUIS) VTE STARTER PACK (10MG  AND 5MG ) Take as directed on package: start with two-5mg  tablets twice daily for 7 days. On day 8, switch to one-5mg  tablet twice daily.   buPROPion (WELLBUTRIN XL) 150 MG 24 hr tablet 1 tablet in the morning Orally Once a day   cyclobenzaprine (FLEXERIL) 10 MG tablet  TAKE 1 TABLET(10 MG) BY MOUTH THREE TIMES DAILY AS NEEDED FOR MUSCLE SPASMS   cyclobenzaprine (FLEXERIL) 5 MG tablet Take 5 mg by mouth 3 (three) times daily. (Patient not taking: Reported on 11/18/2021)   ELIQUIS 2.5 MG TABS tablet TAKE 1 TABLET(2.5 MG) BY MOUTH TWICE DAILY   escitalopram (LEXAPRO) 20 MG tablet Take 1 tablet (20 mg total) by mouth daily.   furosemide (LASIX) 20 MG tablet Take 1 tablet (20 mg total) by mouth daily as needed.   HYDROcodone-acetaminophen (NORCO) 10-325 MG tablet Take 1 tablet by mouth every 6 (six) hours as needed.   HYDROcodone-acetaminophen (NORCO/VICODIN) 5-325 MG tablet Take 1-2 tablets by mouth every 6 (six) hours as needed for moderate pain.   leflunomide (ARAVA) 10 MG tablet Take 1 tablet (10 mg total) by mouth daily.   losartan (COZAAR) 25 MG tablet TAKE 1 TABLET(25 MG) BY MOUTH DAILY   Multiple Vitamins-Minerals (WOMENS DAILY FORMULA) TABS Take 1 tablet by mouth daily.     ondansetron (ZOFRAN) 4 MG tablet Take 1 tablet (4 mg total) by mouth every 6 (six) hours.   potassium chloride (KLOR-CON) 10 MEQ tablet TAKE 1 TABLET(10 MEQ) BY MOUTH DAILY   predniSONE (DELTASONE) 5 MG tablet TAKE 1 TABLET(5 MG) BY MOUTH DAILY   pregabalin (LYRICA) 75 MG capsule TAKE 1 CAPSULE(75 MG) BY MOUTH AT BEDTIME   vitamin C (ASCORBIC ACID) 500 MG  tablet Take 500 mg by mouth daily.     vitamin E 180 MG (400 UNITS) capsule take 1 capsule by oral route every day   vitamin E 400 UNIT capsule Take 400 Units by mouth daily.     No facility-administered encounter medications on file as of 11/23/2021.    Allergies (verified) Infliximab, Sulfa antibiotics, Sulfacetamide sodium-sulfur, and Sulfonamide derivatives   History: Past Medical History:  Diagnosis Date   Anemia    Anxiety    Depression    Fibromyalgia    Hyperglycemia    HYPERGLYCEMIA, FASTING 08/22/2007   Hypertension    Multiple thyroid nodules    gets ultra sound yearly on thryoid   Rheumatoid arthritis(714.0)     Past Surgical History:  Procedure Laterality Date   ABDOMINAL HYSTERECTOMY     with BSO   BREAST BIOPSY     JOINT REPLACEMENT     right knee replacement 2012   TOTAL KNEE ARTHROPLASTY Left 07/07/2014   Procedure: LEFT TOTAL KNEE ARTHROPLASTY;  Surgeon: Durene Romans, MD;  Location: [facility];  Service: Orthopedics;  Laterality: Left;   Family History  Problem Relation Age of Onset   Cancer Mother 6       breast   Arthritis Father    Hypertension Father    Diabetes Sister    Hypertension Brother    Diabetes Son    Depression Son    Social History   Socioeconomic History   Marital status: Married    Spouse name: Not on file   Number of children: Not on file   Years of education: Not on file   Highest education level: Not on file  Occupational History   Not on file  Tobacco Use   Smoking status: Never   Smokeless tobacco: Never  Vaping Use   Vaping Use: Never used  Substance and Sexual Activity   Alcohol use: No   Drug use: No   Sexual activity: Not Currently  Other Topics Concern   Not on file  Social History Narrative   Reg exercise         Social Determinants of Health   Financial Resource Strain: Not on file  Food Insecurity: Not on file  Transportation Needs: Not on file  Physical Activity: Not on file  Stress: Not on file  Social Connections: Not on file    Tobacco Counseling Counseling given: Not Answered   Clinical Intake:                 Diabetic?No         Activities of Daily Living     No data to display           Patient Care Team: Pincus Sanes, MD as PCP - General (Internal Medicine)  Indicate any recent Medical Services you may have received from other than [facility] providers in the past year (date may be approximate).     Assessment:   This is a routine wellness examination for Haley Roberts.  Hearing/Vision screen No results found.  Dietary issues and exercise activities discussed:     Goals Addressed    None   Depression Screen    08/25/2021    9:07 AM 10/29/2020    2:43 PM 11/18/2019    1:40 PM 05/28/2015    1:20 PM  PHQ 2/9 Scores  PHQ - 2 Score 0 0 0 0  PHQ- 9 Score 0 0      Fall Risk    08/25/2021  9:07 AM 04/26/2021    1:06 PM 10/29/2020    2:43 PM 12/06/2018    2:23 PM 08/15/2018    9:14 AM  Fall Risk   Falls in the past year? 0 1 0 0   Comment    [facility] Telephone Survey: data to providers prior to load [employer] Survey: data to providers prior to load  Number falls in past yr: 0 1 0    Comment     [facility] Telephone Survey Actual Response =   Injury with Fall? 0 1 0    Risk for fall due to : No Fall Risks History of fall(s) No Fall Risks    Follow up Falls evaluation completed Falls evaluation completed Falls evaluation completed      FALL RISK PREVENTION PERTAINING TO THE HOME:  Any stairs in or around the home? {YES/NO:21197} If so, are there any without handrails? {YES/NO:21197} Home free of loose throw rugs in walkways, pet beds, electrical cords, etc? {YES/NO:21197} Adequate lighting in your home to reduce risk of falls? {YES/NO:21197}  ASSISTIVE DEVICES UTILIZED TO PREVENT FALLS:  Life alert? {YES/NO:21197} Use of a cane, walker or w/c? {YES/NO:21197} Grab bars in the bathroom? {YES/NO:21197} Shower chair or bench in shower? {YES/NO:21197} Elevated toilet seat or a handicapped toilet? {YES/NO:21197}  Cognitive Function:        Immunizations Immunization History  Administered Date(s) Administered   Fluad Quad(high Dose 65+) 11/18/2019, 10/25/2020, 11/18/2021   Influenza Whole 01/12/2009   Pneumococcal Conjugate-13 05/28/2015   Pneumococcal Polysaccharide-23 08/01/2016   Td 05/17/1998, 03/30/2010   Td (Adult), 2 Lf Tetanus Toxid, Preservative Free 05/17/1998, 03/30/2010    TDAP status: Due, Education has been provided regarding the importance of this vaccine. Advised may receive this vaccine at local pharmacy or Health Dept. Aware to provide a  copy of the vaccination record if obtained from local pharmacy or Health Dept. Verbalized acceptance and understanding.  Flu Vaccine status: Up to date  Pneumococcal vaccine status: Up to date  Covid-19 vaccine status: Information provided on how to obtain vaccines.   Qualifies for Shingles Vaccine? Yes   Zostavax completed No   Shingrix Completed?: No.    Education has been provided regarding the importance of this vaccine. Patient has been advised to call insurance company to determine out of pocket expense if they have not yet received this vaccine. Advised may also receive vaccine at local pharmacy or Health Dept. Verbalized acceptance and understanding.  Screening Tests Health Maintenance  Topic Date Due   COVID-19 Vaccine (1) Never done   Zoster Vaccines- Shingrix (1 of 2) Never done   COLONOSCOPY (Pts 45-31yrs Insurance coverage will need to be confirmed)  Never done   MAMMOGRAM  01/04/2014   TETANUS/TDAP  03/29/2020   Medicare Annual Wellness (AWV)  11/24/2022   Pneumonia Vaccine 71+ Years old  Completed   INFLUENZA VACCINE  Completed   DEXA SCAN  Completed   Hepatitis C Screening  Completed   HPV VACCINES  Aged Out    Health Maintenance  Health Maintenance Due  Topic Date Due   COVID-19 Vaccine (1) Never done   Zoster Vaccines- Shingrix (1 of 2) Never done   COLONOSCOPY (Pts 45-87yrs Insurance coverage will need to be confirmed)  Never done   MAMMOGRAM  01/04/2014   TETANUS/TDAP  03/29/2020    {Colorectal cancer screening:2101809}  {Mammogram status:21018020}  {Bone Density status:21018021}  Lung Cancer Screening: (Low Dose CT Chest recommended if Age 70-80 years, 30 pack-year currently smoking OR  have quit w/in 15years.) {DOES NOT does:27190::"does not"} qualify.   Lung Cancer Screening Referral: ***  Additional Screening:  Hepatitis C Screening: does qualify; Completed 01/16/13  Vision Screening: Recommended annual ophthalmology exams for early detection  of glaucoma and other disorders of the eye. Is the patient up to date with their annual eye exam?  {YES/NO:21197} Who is the provider or what is the name of the office in which the patient attends annual eye exams? *** If pt is not established with a provider, would they like to be referred to a provider to establish care? {YES/NO:21197}.   Dental Screening: Recommended annual dental exams for proper oral hygiene  Community Resource Referral / Chronic Care Management: CRR required this visit?  {YES/NO:21197}  CCM required this visit?  {YES/NO:21197}     Plan:     I have personally reviewed and noted the following in the patient's chart:   Medical and social history Use of alcohol, tobacco or illicit drugs  Current medications and supplements including opioid prescriptions. Patient is currently taking opioid prescriptions. Information provided to patient regarding non-opioid alternatives. Patient advised to discuss non-opioid treatment plan with their provider. Functional ability and status Nutritional status Physical activity Advanced directives List of other physicians Hospitalizations, surgeries, and ER visits in previous 12 months Vitals Screenings to include cognitive, depression, and falls Referrals and appointments  In addition, I have reviewed and discussed with patient certain preventive protocols, quality metrics, and best practice recommendations. A written personalized care plan for preventive services as well as general preventive health recommendations were provided to patient.     Wanda Plump, RN   11/22/2021   Nurse Notes:  Ms. Heinly , Thank you for taking time to come for your Medicare Wellness Visit. I appreciate your ongoing commitment to your health goals. Please review the following plan we discussed and let me know if I can assist you in the future.   These are the goals we discussed:  Goals   None     This is a list of the screening recommended  for you and due dates:  Health Maintenance  Topic Date Due   COVID-19 Vaccine (1) Never done   Zoster (Shingles) Vaccine (1 of 2) Never done   Colon Cancer Screening  Never done   Mammogram  01/04/2014   Tetanus Vaccine  03/29/2020   Medicare Annual Wellness Visit  11/24/2022   Pneumonia Vaccine  Completed   Flu Shot  Completed   DEXA scan (bone density measurement)  Completed   Hepatitis C Screening: USPSTF Recommendation to screen - Ages 21-79 yo.  Completed   HPV Vaccine  Aged Out

## 2021-11-23 ENCOUNTER — Ambulatory Visit: Payer: Medicare Other | Admitting: *Deleted

## 2021-11-23 DIAGNOSIS — Z0000 Encounter for general adult medical examination without abnormal findings: Secondary | ICD-10-CM

## 2021-11-24 DIAGNOSIS — R5381 Other malaise: Secondary | ICD-10-CM | POA: Diagnosis not present

## 2021-11-24 DIAGNOSIS — Z9889 Other specified postprocedural states: Secondary | ICD-10-CM | POA: Diagnosis not present

## 2021-11-24 DIAGNOSIS — M6281 Muscle weakness (generalized): Secondary | ICD-10-CM | POA: Diagnosis not present

## 2021-11-24 DIAGNOSIS — M4316 Spondylolisthesis, lumbar region: Secondary | ICD-10-CM | POA: Diagnosis not present

## 2021-11-24 DIAGNOSIS — M069 Rheumatoid arthritis, unspecified: Secondary | ICD-10-CM | POA: Diagnosis not present

## 2021-11-24 DIAGNOSIS — R2689 Other abnormalities of gait and mobility: Secondary | ICD-10-CM | POA: Diagnosis not present

## 2021-11-27 ENCOUNTER — Other Ambulatory Visit: Payer: Self-pay | Admitting: Internal Medicine

## 2021-11-28 ENCOUNTER — Ambulatory Visit (HOSPITAL_COMMUNITY)
Admission: RE | Admit: 2021-11-28 | Discharge: 2021-11-28 | Disposition: A | Discharge status: Home / Self Care | Payer: Medicare Other | Source: Ambulatory Visit | Attending: Internal Medicine | Admitting: Internal Medicine

## 2021-11-28 DIAGNOSIS — R600 Localized edema: Secondary | ICD-10-CM | POA: Diagnosis not present

## 2021-11-28 NOTE — Telephone Encounter (Signed)
Patient negative for DVT but has old one in Popliteal vein.

## 2021-11-29 DIAGNOSIS — M069 Rheumatoid arthritis, unspecified: Secondary | ICD-10-CM | POA: Diagnosis not present

## 2021-11-29 DIAGNOSIS — M6281 Muscle weakness (generalized): Secondary | ICD-10-CM | POA: Diagnosis not present

## 2021-11-29 DIAGNOSIS — R2689 Other abnormalities of gait and mobility: Secondary | ICD-10-CM | POA: Diagnosis not present

## 2021-11-29 DIAGNOSIS — R5381 Other malaise: Secondary | ICD-10-CM | POA: Diagnosis not present

## 2021-11-29 DIAGNOSIS — M4316 Spondylolisthesis, lumbar region: Secondary | ICD-10-CM | POA: Diagnosis not present

## 2021-11-29 DIAGNOSIS — Z9889 Other specified postprocedural states: Secondary | ICD-10-CM | POA: Diagnosis not present

## 2021-12-06 DIAGNOSIS — Z9889 Other specified postprocedural states: Secondary | ICD-10-CM | POA: Diagnosis not present

## 2021-12-06 DIAGNOSIS — R5381 Other malaise: Secondary | ICD-10-CM | POA: Diagnosis not present

## 2021-12-06 DIAGNOSIS — R2689 Other abnormalities of gait and mobility: Secondary | ICD-10-CM | POA: Diagnosis not present

## 2021-12-06 DIAGNOSIS — M4316 Spondylolisthesis, lumbar region: Secondary | ICD-10-CM | POA: Diagnosis not present

## 2021-12-06 DIAGNOSIS — M6281 Muscle weakness (generalized): Secondary | ICD-10-CM | POA: Diagnosis not present

## 2021-12-06 DIAGNOSIS — M069 Rheumatoid arthritis, unspecified: Secondary | ICD-10-CM | POA: Diagnosis not present

## 2021-12-13 DIAGNOSIS — Z9889 Other specified postprocedural states: Secondary | ICD-10-CM | POA: Diagnosis not present

## 2021-12-13 DIAGNOSIS — M6281 Muscle weakness (generalized): Secondary | ICD-10-CM | POA: Diagnosis not present

## 2021-12-13 DIAGNOSIS — R5381 Other malaise: Secondary | ICD-10-CM | POA: Diagnosis not present

## 2021-12-13 DIAGNOSIS — M069 Rheumatoid arthritis, unspecified: Secondary | ICD-10-CM | POA: Diagnosis not present

## 2021-12-13 DIAGNOSIS — M4316 Spondylolisthesis, lumbar region: Secondary | ICD-10-CM | POA: Diagnosis not present

## 2021-12-13 DIAGNOSIS — R2689 Other abnormalities of gait and mobility: Secondary | ICD-10-CM | POA: Diagnosis not present

## 2021-12-14 ENCOUNTER — Other Ambulatory Visit: Payer: Self-pay | Admitting: Internal Medicine

## 2021-12-15 DIAGNOSIS — Z9889 Other specified postprocedural states: Secondary | ICD-10-CM | POA: Diagnosis not present

## 2021-12-15 DIAGNOSIS — R2689 Other abnormalities of gait and mobility: Secondary | ICD-10-CM | POA: Diagnosis not present

## 2021-12-15 DIAGNOSIS — M4316 Spondylolisthesis, lumbar region: Secondary | ICD-10-CM | POA: Diagnosis not present

## 2021-12-15 DIAGNOSIS — M069 Rheumatoid arthritis, unspecified: Secondary | ICD-10-CM | POA: Diagnosis not present

## 2021-12-15 DIAGNOSIS — M6281 Muscle weakness (generalized): Secondary | ICD-10-CM | POA: Diagnosis not present

## 2021-12-15 DIAGNOSIS — R5381 Other malaise: Secondary | ICD-10-CM | POA: Diagnosis not present

## 2021-12-15 MED ORDER — HYDROCODONE-ACETAMINOPHEN 5-325 MG PO TABS: 1.0000 | ORAL_TABLET | Freq: Four times a day (QID) | ORAL | 0 refills | Status: DC | PRN

## 2021-12-16 ENCOUNTER — Other Ambulatory Visit: Payer: Self-pay | Admitting: Internal Medicine

## 2021-12-16 MED ORDER — HYDROCODONE-ACETAMINOPHEN 5-325 MG PO TABS: 1.0000 | ORAL_TABLET | Freq: Four times a day (QID) | ORAL | 0 refills | Status: DC | PRN

## 2021-12-22 DIAGNOSIS — Z9889 Other specified postprocedural states: Secondary | ICD-10-CM | POA: Diagnosis not present

## 2021-12-22 DIAGNOSIS — R2689 Other abnormalities of gait and mobility: Secondary | ICD-10-CM | POA: Diagnosis not present

## 2021-12-22 DIAGNOSIS — R5381 Other malaise: Secondary | ICD-10-CM | POA: Diagnosis not present

## 2021-12-22 DIAGNOSIS — M069 Rheumatoid arthritis, unspecified: Secondary | ICD-10-CM | POA: Diagnosis not present

## 2021-12-22 DIAGNOSIS — M6281 Muscle weakness (generalized): Secondary | ICD-10-CM | POA: Diagnosis not present

## 2021-12-22 DIAGNOSIS — M4316 Spondylolisthesis, lumbar region: Secondary | ICD-10-CM | POA: Diagnosis not present

## 2021-12-27 DIAGNOSIS — M4316 Spondylolisthesis, lumbar region: Secondary | ICD-10-CM | POA: Diagnosis not present

## 2021-12-27 DIAGNOSIS — R5381 Other malaise: Secondary | ICD-10-CM | POA: Diagnosis not present

## 2021-12-27 DIAGNOSIS — M6281 Muscle weakness (generalized): Secondary | ICD-10-CM | POA: Diagnosis not present

## 2021-12-27 DIAGNOSIS — Z9889 Other specified postprocedural states: Secondary | ICD-10-CM | POA: Diagnosis not present

## 2021-12-27 DIAGNOSIS — R2689 Other abnormalities of gait and mobility: Secondary | ICD-10-CM | POA: Diagnosis not present

## 2021-12-27 DIAGNOSIS — M069 Rheumatoid arthritis, unspecified: Secondary | ICD-10-CM | POA: Diagnosis not present

## 2021-12-29 DIAGNOSIS — M069 Rheumatoid arthritis, unspecified: Secondary | ICD-10-CM | POA: Diagnosis not present

## 2021-12-29 DIAGNOSIS — R5381 Other malaise: Secondary | ICD-10-CM | POA: Diagnosis not present

## 2021-12-29 DIAGNOSIS — M4316 Spondylolisthesis, lumbar region: Secondary | ICD-10-CM | POA: Diagnosis not present

## 2021-12-29 DIAGNOSIS — Z9889 Other specified postprocedural states: Secondary | ICD-10-CM | POA: Diagnosis not present

## 2021-12-29 DIAGNOSIS — M6281 Muscle weakness (generalized): Secondary | ICD-10-CM | POA: Diagnosis not present

## 2021-12-29 DIAGNOSIS — R2689 Other abnormalities of gait and mobility: Secondary | ICD-10-CM | POA: Diagnosis not present

## 2022-01-04 ENCOUNTER — Other Ambulatory Visit: Payer: Self-pay | Admitting: Internal Medicine

## 2022-01-16 ENCOUNTER — Other Ambulatory Visit: Payer: Self-pay | Admitting: Internal Medicine

## 2022-01-17 DIAGNOSIS — M069 Rheumatoid arthritis, unspecified: Secondary | ICD-10-CM | POA: Diagnosis not present

## 2022-01-17 DIAGNOSIS — Z9889 Other specified postprocedural states: Secondary | ICD-10-CM | POA: Diagnosis not present

## 2022-01-17 DIAGNOSIS — R5381 Other malaise: Secondary | ICD-10-CM | POA: Diagnosis not present

## 2022-01-17 DIAGNOSIS — M6281 Muscle weakness (generalized): Secondary | ICD-10-CM | POA: Diagnosis not present

## 2022-01-17 DIAGNOSIS — M4316 Spondylolisthesis, lumbar region: Secondary | ICD-10-CM | POA: Diagnosis not present

## 2022-01-17 DIAGNOSIS — R2689 Other abnormalities of gait and mobility: Secondary | ICD-10-CM | POA: Diagnosis not present

## 2022-01-19 DIAGNOSIS — R5381 Other malaise: Secondary | ICD-10-CM | POA: Diagnosis not present

## 2022-01-19 DIAGNOSIS — M6281 Muscle weakness (generalized): Secondary | ICD-10-CM | POA: Diagnosis not present

## 2022-01-19 DIAGNOSIS — M069 Rheumatoid arthritis, unspecified: Secondary | ICD-10-CM | POA: Diagnosis not present

## 2022-01-19 DIAGNOSIS — R2689 Other abnormalities of gait and mobility: Secondary | ICD-10-CM | POA: Diagnosis not present

## 2022-01-19 DIAGNOSIS — M4316 Spondylolisthesis, lumbar region: Secondary | ICD-10-CM | POA: Diagnosis not present

## 2022-01-19 DIAGNOSIS — Z9889 Other specified postprocedural states: Secondary | ICD-10-CM | POA: Diagnosis not present

## 2022-01-19 MED ORDER — HYDROCODONE-ACETAMINOPHEN 5-325 MG PO TABS: 1.0000 | ORAL_TABLET | Freq: Four times a day (QID) | ORAL | 0 refills | Status: DC | PRN

## 2022-01-26 DIAGNOSIS — R5381 Other malaise: Secondary | ICD-10-CM | POA: Diagnosis not present

## 2022-01-26 DIAGNOSIS — R2689 Other abnormalities of gait and mobility: Secondary | ICD-10-CM | POA: Diagnosis not present

## 2022-01-26 DIAGNOSIS — M069 Rheumatoid arthritis, unspecified: Secondary | ICD-10-CM | POA: Diagnosis not present

## 2022-01-26 DIAGNOSIS — M6281 Muscle weakness (generalized): Secondary | ICD-10-CM | POA: Diagnosis not present

## 2022-01-26 DIAGNOSIS — Z9889 Other specified postprocedural states: Secondary | ICD-10-CM | POA: Diagnosis not present

## 2022-01-26 DIAGNOSIS — M4316 Spondylolisthesis, lumbar region: Secondary | ICD-10-CM | POA: Diagnosis not present

## 2022-01-31 DIAGNOSIS — R2689 Other abnormalities of gait and mobility: Secondary | ICD-10-CM | POA: Diagnosis not present

## 2022-01-31 DIAGNOSIS — Z9889 Other specified postprocedural states: Secondary | ICD-10-CM | POA: Diagnosis not present

## 2022-01-31 DIAGNOSIS — M4316 Spondylolisthesis, lumbar region: Secondary | ICD-10-CM | POA: Diagnosis not present

## 2022-01-31 DIAGNOSIS — M6281 Muscle weakness (generalized): Secondary | ICD-10-CM | POA: Diagnosis not present

## 2022-01-31 DIAGNOSIS — R5381 Other malaise: Secondary | ICD-10-CM | POA: Diagnosis not present

## 2022-01-31 DIAGNOSIS — M069 Rheumatoid arthritis, unspecified: Secondary | ICD-10-CM | POA: Diagnosis not present

## 2022-02-02 DIAGNOSIS — M6281 Muscle weakness (generalized): Secondary | ICD-10-CM | POA: Diagnosis not present

## 2022-02-02 DIAGNOSIS — Z9889 Other specified postprocedural states: Secondary | ICD-10-CM | POA: Diagnosis not present

## 2022-02-02 DIAGNOSIS — R2689 Other abnormalities of gait and mobility: Secondary | ICD-10-CM | POA: Diagnosis not present

## 2022-02-02 DIAGNOSIS — M4316 Spondylolisthesis, lumbar region: Secondary | ICD-10-CM | POA: Diagnosis not present

## 2022-02-02 DIAGNOSIS — M069 Rheumatoid arthritis, unspecified: Secondary | ICD-10-CM | POA: Diagnosis not present

## 2022-02-02 DIAGNOSIS — R5381 Other malaise: Secondary | ICD-10-CM | POA: Diagnosis not present

## 2022-02-07 DIAGNOSIS — M4316 Spondylolisthesis, lumbar region: Secondary | ICD-10-CM | POA: Diagnosis not present

## 2022-02-07 DIAGNOSIS — Z9889 Other specified postprocedural states: Secondary | ICD-10-CM | POA: Diagnosis not present

## 2022-02-07 DIAGNOSIS — R2689 Other abnormalities of gait and mobility: Secondary | ICD-10-CM | POA: Diagnosis not present

## 2022-02-07 DIAGNOSIS — M6281 Muscle weakness (generalized): Secondary | ICD-10-CM | POA: Diagnosis not present

## 2022-02-07 DIAGNOSIS — M069 Rheumatoid arthritis, unspecified: Secondary | ICD-10-CM | POA: Diagnosis not present

## 2022-02-07 DIAGNOSIS — R5381 Other malaise: Secondary | ICD-10-CM | POA: Diagnosis not present

## 2022-02-14 DIAGNOSIS — M069 Rheumatoid arthritis, unspecified: Secondary | ICD-10-CM | POA: Diagnosis not present

## 2022-02-14 DIAGNOSIS — M6281 Muscle weakness (generalized): Secondary | ICD-10-CM | POA: Diagnosis not present

## 2022-02-14 DIAGNOSIS — R5381 Other malaise: Secondary | ICD-10-CM | POA: Diagnosis not present

## 2022-02-14 DIAGNOSIS — R2689 Other abnormalities of gait and mobility: Secondary | ICD-10-CM | POA: Diagnosis not present

## 2022-02-14 DIAGNOSIS — M4316 Spondylolisthesis, lumbar region: Secondary | ICD-10-CM | POA: Diagnosis not present

## 2022-02-14 DIAGNOSIS — Z9889 Other specified postprocedural states: Secondary | ICD-10-CM | POA: Diagnosis not present

## 2022-02-16 ENCOUNTER — Other Ambulatory Visit: Payer: Self-pay | Admitting: Internal Medicine

## 2022-02-16 DIAGNOSIS — M6281 Muscle weakness (generalized): Secondary | ICD-10-CM | POA: Diagnosis not present

## 2022-02-16 DIAGNOSIS — Z9889 Other specified postprocedural states: Secondary | ICD-10-CM | POA: Diagnosis not present

## 2022-02-16 DIAGNOSIS — R2689 Other abnormalities of gait and mobility: Secondary | ICD-10-CM | POA: Diagnosis not present

## 2022-02-16 DIAGNOSIS — M4316 Spondylolisthesis, lumbar region: Secondary | ICD-10-CM | POA: Diagnosis not present

## 2022-02-16 DIAGNOSIS — M069 Rheumatoid arthritis, unspecified: Secondary | ICD-10-CM | POA: Diagnosis not present

## 2022-02-16 DIAGNOSIS — R5381 Other malaise: Secondary | ICD-10-CM | POA: Diagnosis not present

## 2022-02-16 MED ORDER — HYDROCODONE-ACETAMINOPHEN 5-325 MG PO TABS: 1.0000 | ORAL_TABLET | Freq: Four times a day (QID) | ORAL | 0 refills | Status: DC | PRN

## 2022-02-21 DIAGNOSIS — M069 Rheumatoid arthritis, unspecified: Secondary | ICD-10-CM | POA: Diagnosis not present

## 2022-02-21 DIAGNOSIS — R2689 Other abnormalities of gait and mobility: Secondary | ICD-10-CM | POA: Diagnosis not present

## 2022-02-21 DIAGNOSIS — Z9889 Other specified postprocedural states: Secondary | ICD-10-CM | POA: Diagnosis not present

## 2022-02-21 DIAGNOSIS — R5381 Other malaise: Secondary | ICD-10-CM | POA: Diagnosis not present

## 2022-02-21 DIAGNOSIS — M4316 Spondylolisthesis, lumbar region: Secondary | ICD-10-CM | POA: Diagnosis not present

## 2022-02-21 DIAGNOSIS — M6281 Muscle weakness (generalized): Secondary | ICD-10-CM | POA: Diagnosis not present

## 2022-02-23 DIAGNOSIS — M069 Rheumatoid arthritis, unspecified: Secondary | ICD-10-CM | POA: Diagnosis not present

## 2022-02-23 DIAGNOSIS — Z9889 Other specified postprocedural states: Secondary | ICD-10-CM | POA: Diagnosis not present

## 2022-02-23 DIAGNOSIS — M4316 Spondylolisthesis, lumbar region: Secondary | ICD-10-CM | POA: Diagnosis not present

## 2022-02-23 DIAGNOSIS — R5381 Other malaise: Secondary | ICD-10-CM | POA: Diagnosis not present

## 2022-02-23 DIAGNOSIS — M6281 Muscle weakness (generalized): Secondary | ICD-10-CM | POA: Diagnosis not present

## 2022-02-23 DIAGNOSIS — R2689 Other abnormalities of gait and mobility: Secondary | ICD-10-CM | POA: Diagnosis not present

## 2022-02-28 DIAGNOSIS — R2689 Other abnormalities of gait and mobility: Secondary | ICD-10-CM | POA: Diagnosis not present

## 2022-02-28 DIAGNOSIS — M069 Rheumatoid arthritis, unspecified: Secondary | ICD-10-CM | POA: Diagnosis not present

## 2022-02-28 DIAGNOSIS — Z9889 Other specified postprocedural states: Secondary | ICD-10-CM | POA: Diagnosis not present

## 2022-02-28 DIAGNOSIS — M4316 Spondylolisthesis, lumbar region: Secondary | ICD-10-CM | POA: Diagnosis not present

## 2022-02-28 DIAGNOSIS — R5381 Other malaise: Secondary | ICD-10-CM | POA: Diagnosis not present

## 2022-02-28 DIAGNOSIS — M6281 Muscle weakness (generalized): Secondary | ICD-10-CM | POA: Diagnosis not present

## 2022-03-07 DIAGNOSIS — R5381 Other malaise: Secondary | ICD-10-CM | POA: Diagnosis not present

## 2022-03-07 DIAGNOSIS — M069 Rheumatoid arthritis, unspecified: Secondary | ICD-10-CM | POA: Diagnosis not present

## 2022-03-07 DIAGNOSIS — M4316 Spondylolisthesis, lumbar region: Secondary | ICD-10-CM | POA: Diagnosis not present

## 2022-03-07 DIAGNOSIS — M6281 Muscle weakness (generalized): Secondary | ICD-10-CM | POA: Diagnosis not present

## 2022-03-07 DIAGNOSIS — Z9889 Other specified postprocedural states: Secondary | ICD-10-CM | POA: Diagnosis not present

## 2022-03-07 DIAGNOSIS — R2689 Other abnormalities of gait and mobility: Secondary | ICD-10-CM | POA: Diagnosis not present

## 2022-03-14 ENCOUNTER — Other Ambulatory Visit: Payer: Self-pay | Admitting: Internal Medicine

## 2022-03-14 DIAGNOSIS — M4316 Spondylolisthesis, lumbar region: Secondary | ICD-10-CM | POA: Diagnosis not present

## 2022-03-14 DIAGNOSIS — M069 Rheumatoid arthritis, unspecified: Secondary | ICD-10-CM | POA: Diagnosis not present

## 2022-03-14 DIAGNOSIS — Z9889 Other specified postprocedural states: Secondary | ICD-10-CM | POA: Diagnosis not present

## 2022-03-14 DIAGNOSIS — R5381 Other malaise: Secondary | ICD-10-CM | POA: Diagnosis not present

## 2022-03-14 DIAGNOSIS — R2689 Other abnormalities of gait and mobility: Secondary | ICD-10-CM | POA: Diagnosis not present

## 2022-03-14 DIAGNOSIS — M6281 Muscle weakness (generalized): Secondary | ICD-10-CM | POA: Diagnosis not present

## 2022-03-16 ENCOUNTER — Other Ambulatory Visit: Payer: Self-pay | Admitting: Internal Medicine

## 2022-03-16 DIAGNOSIS — R2689 Other abnormalities of gait and mobility: Secondary | ICD-10-CM | POA: Diagnosis not present

## 2022-03-16 DIAGNOSIS — Z9889 Other specified postprocedural states: Secondary | ICD-10-CM | POA: Diagnosis not present

## 2022-03-16 DIAGNOSIS — M069 Rheumatoid arthritis, unspecified: Secondary | ICD-10-CM | POA: Diagnosis not present

## 2022-03-16 DIAGNOSIS — M6281 Muscle weakness (generalized): Secondary | ICD-10-CM | POA: Diagnosis not present

## 2022-03-16 DIAGNOSIS — M4316 Spondylolisthesis, lumbar region: Secondary | ICD-10-CM | POA: Diagnosis not present

## 2022-03-16 DIAGNOSIS — R5381 Other malaise: Secondary | ICD-10-CM | POA: Diagnosis not present

## 2022-03-16 MED ORDER — HYDROCODONE-ACETAMINOPHEN 5-325 MG PO TABS: 1.0000 | ORAL_TABLET | Freq: Four times a day (QID) | ORAL | 0 refills | Status: DC | PRN

## 2022-03-17 ENCOUNTER — Inpatient Hospital Stay: Payer: Medicare Other

## 2022-03-17 ENCOUNTER — Inpatient Hospital Stay: Payer: Medicare Other | Admitting: Oncology

## 2022-03-28 ENCOUNTER — Other Ambulatory Visit: Payer: Self-pay | Admitting: Internal Medicine

## 2022-03-30 DIAGNOSIS — Z9889 Other specified postprocedural states: Secondary | ICD-10-CM | POA: Diagnosis not present

## 2022-03-30 DIAGNOSIS — M4316 Spondylolisthesis, lumbar region: Secondary | ICD-10-CM | POA: Diagnosis not present

## 2022-03-30 DIAGNOSIS — M069 Rheumatoid arthritis, unspecified: Secondary | ICD-10-CM | POA: Diagnosis not present

## 2022-03-30 DIAGNOSIS — M6281 Muscle weakness (generalized): Secondary | ICD-10-CM | POA: Diagnosis not present

## 2022-03-30 DIAGNOSIS — R2689 Other abnormalities of gait and mobility: Secondary | ICD-10-CM | POA: Diagnosis not present

## 2022-03-30 DIAGNOSIS — R5381 Other malaise: Secondary | ICD-10-CM | POA: Diagnosis not present

## 2022-04-04 DIAGNOSIS — R2689 Other abnormalities of gait and mobility: Secondary | ICD-10-CM | POA: Diagnosis not present

## 2022-04-04 DIAGNOSIS — Z9889 Other specified postprocedural states: Secondary | ICD-10-CM | POA: Diagnosis not present

## 2022-04-04 DIAGNOSIS — R5381 Other malaise: Secondary | ICD-10-CM | POA: Diagnosis not present

## 2022-04-04 DIAGNOSIS — M6281 Muscle weakness (generalized): Secondary | ICD-10-CM | POA: Diagnosis not present

## 2022-04-04 DIAGNOSIS — M069 Rheumatoid arthritis, unspecified: Secondary | ICD-10-CM | POA: Diagnosis not present

## 2022-04-04 DIAGNOSIS — M4316 Spondylolisthesis, lumbar region: Secondary | ICD-10-CM | POA: Diagnosis not present

## 2022-04-11 DIAGNOSIS — M6281 Muscle weakness (generalized): Secondary | ICD-10-CM | POA: Diagnosis not present

## 2022-04-11 DIAGNOSIS — R2689 Other abnormalities of gait and mobility: Secondary | ICD-10-CM | POA: Diagnosis not present

## 2022-04-11 DIAGNOSIS — R5381 Other malaise: Secondary | ICD-10-CM | POA: Diagnosis not present

## 2022-04-11 DIAGNOSIS — M4316 Spondylolisthesis, lumbar region: Secondary | ICD-10-CM | POA: Diagnosis not present

## 2022-04-11 DIAGNOSIS — Z9889 Other specified postprocedural states: Secondary | ICD-10-CM | POA: Diagnosis not present

## 2022-04-11 DIAGNOSIS — M069 Rheumatoid arthritis, unspecified: Secondary | ICD-10-CM | POA: Diagnosis not present

## 2022-04-13 DIAGNOSIS — R2689 Other abnormalities of gait and mobility: Secondary | ICD-10-CM | POA: Diagnosis not present

## 2022-04-13 DIAGNOSIS — M069 Rheumatoid arthritis, unspecified: Secondary | ICD-10-CM | POA: Diagnosis not present

## 2022-04-13 DIAGNOSIS — R5381 Other malaise: Secondary | ICD-10-CM | POA: Diagnosis not present

## 2022-04-13 DIAGNOSIS — Z9889 Other specified postprocedural states: Secondary | ICD-10-CM | POA: Diagnosis not present

## 2022-04-13 DIAGNOSIS — M6281 Muscle weakness (generalized): Secondary | ICD-10-CM | POA: Diagnosis not present

## 2022-04-13 DIAGNOSIS — M4316 Spondylolisthesis, lumbar region: Secondary | ICD-10-CM | POA: Diagnosis not present

## 2022-04-17 ENCOUNTER — Encounter: Payer: Self-pay | Admitting: Internal Medicine

## 2022-04-18 MED ORDER — HYDROCODONE-ACETAMINOPHEN 5-325 MG PO TABS: 1.0000 | ORAL_TABLET | Freq: Four times a day (QID) | ORAL | 0 refills | Status: DC | PRN

## 2022-04-18 NOTE — Telephone Encounter (Signed)
Per telephone encounter patients hydrocodone needs to go to [facility] in [place] - please resend

## 2022-04-27 ENCOUNTER — Other Ambulatory Visit: Payer: Self-pay | Admitting: Oncology

## 2022-04-27 DIAGNOSIS — I82563 Chronic embolism and thrombosis of calf muscular vein, bilateral: Secondary | ICD-10-CM

## 2022-04-28 ENCOUNTER — Other Ambulatory Visit: Payer: Self-pay

## 2022-04-28 ENCOUNTER — Inpatient Hospital Stay: Payer: Medicare Other | Attending: Oncology

## 2022-04-28 ENCOUNTER — Inpatient Hospital Stay (HOSPITAL_BASED_OUTPATIENT_CLINIC_OR_DEPARTMENT_OTHER): Payer: Medicare Other | Admitting: Oncology

## 2022-04-28 VITALS — BP 144/83 | HR 91 | Temp 98.3°F | Resp 16

## 2022-04-28 DIAGNOSIS — I82563 Chronic embolism and thrombosis of calf muscular vein, bilateral: Secondary | ICD-10-CM | POA: Diagnosis not present

## 2022-04-28 DIAGNOSIS — M069 Rheumatoid arthritis, unspecified: Secondary | ICD-10-CM | POA: Diagnosis not present

## 2022-04-28 DIAGNOSIS — I82443 Acute embolism and thrombosis of tibial vein, bilateral: Secondary | ICD-10-CM | POA: Insufficient documentation

## 2022-04-28 DIAGNOSIS — I82453 Acute embolism and thrombosis of peroneal vein, bilateral: Secondary | ICD-10-CM | POA: Diagnosis not present

## 2022-04-28 DIAGNOSIS — I82402 Acute embolism and thrombosis of unspecified deep veins of left lower extremity: Secondary | ICD-10-CM | POA: Diagnosis not present

## 2022-04-28 DIAGNOSIS — I82493 Acute embolism and thrombosis of other specified deep vein of lower extremity, bilateral: Secondary | ICD-10-CM | POA: Insufficient documentation

## 2022-04-28 DIAGNOSIS — I82433 Acute embolism and thrombosis of popliteal vein, bilateral: Secondary | ICD-10-CM | POA: Insufficient documentation

## 2022-04-28 DIAGNOSIS — Z7901 Long term (current) use of anticoagulants: Secondary | ICD-10-CM

## 2022-04-28 LAB — D-DIMER, QUANTITATIVE: D-Dimer, Quant: 0.85 ug/mL-FEU — ABNORMAL HIGH (ref 0.00–0.50)

## 2022-04-28 NOTE — Progress Notes (Signed)
[facility] Cancer Follow up Visit:  Patient Care Team: Pincus Sanes, MD as PCP - General (Internal Medicine)  CHIEF COMPLAINTS/PURPOSE OF CONSULTATION:  HISTORY OF PRESENTING ILLNESS: Haley Roberts 77 y.o. female is here because of  LLE DVT.   Patient has RA diagnosed in about 2017.  Treated with infliximab in the past (has allergy).  RA currently being treated with Leflunomide.   Underwent right knee replacement 2012 and left in Jun 2016 without complication of VTE.     August 20 2020:  MRI Lumbar spine.  L4-5: Advanced facet arthropathy with degenerative anterolisthesis Severe multifactorial spinal stenosis which could cause neural compression in the central canal as well as in both neural foramina.  L5-S1: Facet arthropathy with degenerative anterolisthesis of 5 mm.  Narrowing of the subarticular lateral recesses and of the intervertebral foramen on the left. Left L5 nerve could be compressed.  L3-4: Facet arthropathy with 3 mm of anterolisthesis. Mild stenosis of the lateral recesses and neural foramina could possibly be symptomatic.   September 15 2020:  Lumbar spine surgery.  Lumbar Three-Four, Lumbar Four-Five Posterior lumbar interbody fusion Interbody titanium cages packed with autograft Segmental pedicle screw fixation L3-5, nuvasive relign Laminectomy L3, L4, in excess of the needed exposure for a Plif L3/4,4/5 .  Prior to lumbar spine surgery patient had fall and fractured right medial femoral condyle  Following spine surgery was discharged to inpatient rehab for 4 months.  She was unable to walk.  Her rehab course was complicated by development of DVT in RLE which was diagnosed in November 2022 after surveillance by Doppler.  Patient was treated with 6 months of Eliqui without bleeding problems.    October 21 2020:  Office visit for nondisplaced right medial femoral condyle periprosthetic fracture with stable and apparent healing of this fracture.  This was noted  after a fall   July 06 2021:  CT-scan of abdomen and pelvis   No acute findings explain abdominal pain.  Sigmoid diverticulosis without acute diverticulitis.    Status post multilevel lumbar laminectomy and fusion, with overlying dorsal fluid collection, measuring 8.4 x 4.8 x 2.0 cm. Most likely postoperative seroma  August 25 2021:  Presented with 2 weeks of increase swelling and tightness in the left lower leg.  Doppler U/S Bilateral LE's.  Right negative for DVT.  Left showed acute DVT involving left popliteal vein, left posterior tibial veins, left peroneal veins, and tibioperoneal confluence and saphenopopliteal junction.  Age indeterminate superficial vein thrombosis involving the left small saphenous vein.  September 09 2021:  [facility] Hematology Consult Reviewed relevant medical history.  RA has been active, even prior to surgery despite current therapy and it is manifested by swelling in hands, elbows.  Feet have been numb since surgery.  She has generalized arthralgias.  Has not been on steroids.    No family history of VTE.  Before November 2022 no prior history of VTE.    WBC 5.4 hemoglobin 10.0 MCV 89 platelet count 435; 58 segs 25 lymphs 11 monos 4 eos 1 basophil 1 EO  Anticardiolipin antibody negative antibeta-2 glycoprotein negative.  Factor 8 level 230 D-dimer 2.07 fibrinogen 486 INR 1.1 PTT 29 CRP 0.9 CMP notable for glucose of 118 alk phos 132  April 28, 2022: Scheduled follow-up visit history of VTE.  Discussed results of lab tests.  Patient has ongoing risk factors for VTE which are irreversible. Remains on Eliquis 2.5 mg bid without bleeding issues.  Starting to  ambulate a bit with help of a walker but still spends most of her time in the wheelchair.  Continue Eliquis 2.5 mg bid; can consider Xarelto 10 mg daily instead.   See PRN  Review of Systems  Constitutional:  Negative for chills, fatigue, fever and unexpected weight change.  HENT:   Negative for mouth sores,  nosebleeds, sore throat and trouble swallowing.   Eyes:  Negative for eye problems.       Has cataracts  Respiratory:  Negative for chest tightness, cough, hemoptysis and shortness of breath.   Cardiovascular:  Negative for chest pain, leg swelling and palpitations.  Gastrointestinal:  Negative for abdominal pain, blood in stool, constipation, diarrhea and nausea.  Genitourinary:  Negative for difficulty urinating, dysuria, frequency and hematuria.   Musculoskeletal:  Positive for arthralgias, back pain, gait problem and myalgias.  Neurological:  Positive for extremity weakness and gait problem. Negative for headaches and seizures.       Has numbness in feet.  Wheelchair bound due to neuropathy  Hematological:  Negative for adenopathy. Does not bruise/bleed easily.    MEDICAL HISTORY: Past Medical History:  Diagnosis Date   Anemia    Anxiety    Depression    Fibromyalgia    Hyperglycemia    HYPERGLYCEMIA, FASTING 08/22/2007   Hypertension    Multiple thyroid nodules    gets ultra sound yearly on thryoid   Rheumatoid arthritis(714.0)     SURGICAL HISTORY: Past Surgical History:  Procedure Laterality Date   ABDOMINAL HYSTERECTOMY     with BSO   BREAST BIOPSY     JOINT REPLACEMENT     right knee replacement 2012   TOTAL KNEE ARTHROPLASTY Left 07/07/2014   Procedure: LEFT TOTAL KNEE ARTHROPLASTY;  Surgeon: Durene Romans, MD;  Location: [facility];  Service: Orthopedics;  Laterality: Left;    SOCIAL HISTORY: Social History   Socioeconomic History   Marital status: Married    Spouse name: Not on file   Number of children: Not on file   Years of education: Not on file   Highest education level: Not on file  Occupational History   Not on file  Tobacco Use   Smoking status: Never   Smokeless tobacco: Never  Vaping Use   Vaping Use: Never used  Substance and Sexual Activity   Alcohol use: No   Drug use: No   Sexual activity: Not Currently  Other Topics Concern   Not on file   Social History Narrative   Reg exercise         Social Determinants of Health   Financial Resource Strain: Not on file  Food Insecurity: Not on file  Transportation Needs: Not on file  Physical Activity: Not on file  Stress: Not on file  Social Connections: Not on file  Intimate Partner Violence: Not on file    FAMILY HISTORY Family History  Problem Relation Age of Onset   Cancer Mother 37       breast   Arthritis Father    Hypertension Father    Diabetes Sister    Hypertension Brother    Diabetes Son    Depression Son     ALLERGIES:  is allergic to infliximab, sulfa antibiotics, sulfacetamide sodium-sulfur, and sulfonamide derivatives.  MEDICATIONS:  Current Outpatient Medications  Medication Sig Dispense Refill   APIXABAN (ELIQUIS) VTE STARTER PACK (10MG  AND 5MG ) Take as directed on package: start with two-5mg  tablets twice daily for 7 days. On day 8, switch to one-5mg  tablet  twice daily. 1 each 0   buPROPion (WELLBUTRIN XL) 150 MG 24 hr tablet 1 tablet in the morning Orally Once a day     cyclobenzaprine (FLEXERIL) 10 MG tablet TAKE 1 TABLET(10 MG) BY MOUTH THREE TIMES DAILY AS NEEDED FOR MUSCLE SPASMS 90 tablet 0   ELIQUIS 2.5 MG TABS tablet TAKE 1 TABLET(2.5 MG) BY MOUTH TWICE DAILY 60 tablet 0   escitalopram (LEXAPRO) 20 MG tablet Take 1 tablet (20 mg total) by mouth daily. 90 tablet 3   furosemide (LASIX) 20 MG tablet Take 1 tablet (20 mg total) by mouth daily as needed. 30 tablet 5   HYDROcodone-acetaminophen (NORCO/VICODIN) 5-325 MG tablet Take 1-2 tablets by mouth every 6 (six) hours as needed for moderate pain. 240 tablet 0   leflunomide (ARAVA) 10 MG tablet Take 1 tablet (10 mg total) by mouth daily. 30 tablet 0   losartan (COZAAR) 25 MG tablet TAKE 1 TABLET(25 MG) BY MOUTH DAILY 90 tablet 3   Multiple Vitamins-Minerals (WOMENS DAILY FORMULA) TABS Take 1 tablet by mouth daily.       ondansetron (ZOFRAN) 4 MG tablet Take 1 tablet (4 mg total) by mouth every 6  (six) hours. 12 tablet 0   potassium chloride (KLOR-CON) 10 MEQ tablet TAKE 1 TABLET(10 MEQ) BY MOUTH DAILY 90 tablet 3   predniSONE (DELTASONE) 5 MG tablet TAKE 1 TABLET(5 MG) BY MOUTH DAILY 90 tablet 0   pregabalin (LYRICA) 75 MG capsule TAKE 1 CAPSULE(75 MG) BY MOUTH AT BEDTIME 90 capsule 0   vitamin C (ASCORBIC ACID) 500 MG tablet Take 500 mg by mouth daily.       vitamin E 180 MG (400 UNITS) capsule take 1 capsule by oral route every day     vitamin E 400 UNIT capsule Take 400 Units by mouth daily.       No current facility-administered medications for this visit.    PHYSICAL EXAMINATION:  ECOG PERFORMANCE STATUS: 3 - Symptomatic, >50% confined to bed   There were no vitals filed for this visit.  There were no vitals filed for this visit.   Physical Exam Vitals and nursing note reviewed.  Constitutional:      General: She is not in acute distress.    Appearance: She is not ill-appearing, toxic-appearing or diaphoretic.     Comments: Here with aid.  Seated in wheelchair  HENT:     Head: Normocephalic.     Nose: No congestion or rhinorrhea.     Mouth/Throat:     Mouth: Mucous membranes are dry.     Pharynx: Oropharynx is clear.  Eyes:     General: No scleral icterus.    Extraocular Movements: Extraocular movements intact.     Conjunctiva/sclera: Conjunctivae normal.     Pupils: Pupils are equal, round, and reactive to light.  Cardiovascular:     Rate and Rhythm: Normal rate and regular rhythm.     Heart sounds: Normal heart sounds. No murmur heard.    No friction rub. No gallop.  Pulmonary:     Effort: Pulmonary effort is normal. No respiratory distress.     Breath sounds: Normal breath sounds. No stridor. No wheezing, rhonchi or rales.  Abdominal:     General: Bowel sounds are normal. There is no distension.     Palpations: Abdomen is soft. There is no mass.     Tenderness: There is no guarding or rebound.  Musculoskeletal:        General: Deformity present.  Cervical back: Normal range of motion.     Right lower leg: Edema present.     Left lower leg: Edema present.     Comments: Seated in wheelchair Swan neck deformities of hands.  Thickening of wrists  Lymphadenopathy:     Cervical: No cervical adenopathy.  Skin:    General: Skin is warm and dry.     Coloration: Skin is not jaundiced or pale.     Findings: No bruising.  Neurological:     Mental Status: She is alert and oriented to person, place, and time.     Cranial Nerves: No cranial nerve deficit.     Motor: Weakness present.     Comments: Seated in wheelchair  Psychiatric:        Mood and Affect: Mood normal.        Behavior: Behavior normal.        Thought Content: Thought content normal.        Judgment: Judgment normal.      LABORATORY DATA: I have personally reviewed the data as listed:  No visits with results within 1 Month(s) from this visit.  Latest known visit with results is:  Appointment on 09/09/2021  Component Date Value Ref Range Status   Prothrombin Time 09/09/2021 14.0  11.4 - 15.2 seconds Final   INR 09/09/2021 1.1  0.8 - 1.2 Final   Comment: (NOTE) INR goal varies based on device and disease states. Performed at [facility], [address] [place], [place] [address]    aPTT 09/09/2021 29  24 - 36 seconds Final   Performed at [facility], [address], [place], [place] [address]   WBC Count 09/09/2021 5.4  4.0 - 10.5 K/uL Final   RBC 09/09/2021 3.55 (L)  3.87 - 5.11 MIL/uL Final   Hemoglobin 09/09/2021 10.0 (L)  12.0 - 15.0 g/dL Final   HCT 29/02/1113 31.5 (L)  36.0 - 46.0 % Final   MCV 09/09/2021 88.7  80.0 - 100.0 fL Final   MCH 09/09/2021 28.2  26.0 - 34.0 pg Final   MCHC 09/09/2021 31.7  30.0 - 36.0 g/dL Final   RDW 52/08/221 15.8 (H)  11.5 - 15.5 % Final   Platelet Count 09/09/2021 435 (H)  150 - 400 K/uL Final   nRBC 09/09/2021 0.0  0.0 - 0.2 % Final   Neutrophils Relative % 09/09/2021 58  % Final    Neutro Abs 09/09/2021 3.2  1.7 - 7.7 K/uL Final   Lymphocytes Relative 09/09/2021 25  % Final   Lymphs Abs 09/09/2021 1.4  0.7 - 4.0 K/uL Final   Monocytes Relative 09/09/2021 11  % Final   Monocytes Absolute 09/09/2021 0.6  0.1 - 1.0 K/uL Final   Eosinophils Relative 09/09/2021 4  % Final   Eosinophils Absolute 09/09/2021 0.2  0.0 - 0.5 K/uL Final   Basophils Relative 09/09/2021 1  % Final   Basophils Absolute 09/09/2021 0.1  0.0 - 0.1 K/uL Final   Immature Granulocytes 09/09/2021 1  % Final   Abs Immature Granulocytes 09/09/2021 0.04  0.00 - 0.07 K/uL Final   Performed at [facility], [address] [place], [place] [address]   Sodium 09/09/2021 140  135 - 145 mmol/L Final   Potassium 09/09/2021 3.9  3.5 - 5.1 mmol/L Final   Chloride 09/09/2021 106  98 - 111 mmol/L Final   CO2 09/09/2021 27  22 - 32 mmol/L Final   Glucose, Bld 09/09/2021 118 (  H)  70 - 99 mg/dL Final   Glucose reference range applies only to samples taken after fasting for at least 8 hours.   BUN 09/09/2021 21  8 - 23 mg/dL Final   Creatinine 16/10/9602 0.77  0.44 - 1.00 mg/dL Final   Calcium 54/09/8117 9.1  8.9 - 10.3 mg/dL Final   Total Protein 14/78/2956 7.8  6.5 - 8.1 g/dL Final   Albumin 21/30/8657 3.7  3.5 - 5.0 g/dL Final   AST 84/69/6295 18  15 - 41 U/L Final   ALT 09/09/2021 12  0 - 44 U/L Final   Alkaline Phosphatase 09/09/2021 132 (H)  38 - 126 U/L Final   Total Bilirubin 09/09/2021 0.3  0.3 - 1.2 mg/dL Final   GFR, Estimated 09/09/2021 >60  >60 mL/min Final   Comment: (NOTE) Calculated using the CKD-EPI Creatinine Equation (2021)    Anion gap 09/09/2021 7  5 - 15 Final   Performed at [facility], [address] [place], [place] [address]   CRP 09/09/2021 0.9  <1.0 mg/dL Final   Performed at [facility], [address] [place], [place] [address]    RADIOGRAPHIC STUDIES: I have personally reviewed the radiological images as listed and  agree with the findings in the report  No results found.  ASSESSMENT/PLAN  77 year old female with active RA, spinal stenosis which limits mobility who on August 25 2021 was diagnosed with an acute, symptomatic distal LLE DVT involving the  popliteal vein, posterior tibial veins, peroneal veins, and tibioperoneal confluence and saphenopopliteal junction and an age ndeterminate superficial vein thrombosis involving the left small saphenous vein.  Acute distal LLE VTE: These were provoked by inflammation and immobility due to RA.  Patient has a history of RLE DVT diagnosed in November 2022 for which she received a 6 month course of anticoagulation.  This was reasonable however she developed the LLE DVT following discontinuation of Eliquis.  Fortunately she did not develop an extensive LE DVT nor did she develop a PE.  Eliquis was appropriately restated following the diagnosis of the LLE DVT.   September 10 2022- Continue full dose Eliquis for 6 months and then assess if can decrease Eliquis dose do a prophylactic level.  Patient has ongoing risk factors for redevelopment of VTE in the form of active RA and immobility.  April 28 2022- On Eliquis 2.5 mg bid.  Continue this or Xarelto 10 mg daily indefinitely since patient is not ambulatory and at high likely hood to develop recurrence   Hypercoagulable state evaluation:   August 25 2021- Anticardiolipin antibody negative antibeta-2 glycoprotein negative.   Because of anticoagulation with a direct thrombin inhibitor can not obtain Protein C, Protein S, ATIII activities or Lupus Anticoagulant as those are interfered with by these agents.     Cancer Staging  No matching staging information was found for the patient.   No problem-specific Assessment & Plan notes found for this encounter.   No orders of the defined types were placed in this encounter.   35  minutes was spent in patient care.  This included time spent preparing to see the patient (e.g.,  review of tests), obtaining and/or reviewing separately obtained history, counseling and educating the patient, ordering medications, tests, or procedures; documenting clinical information in the electronic or other health record, independently interpreting results and communicating results to the patient/family/caregiver as well as coordination of care.       All questions were answered.  The patient knows to call the clinic with any problems, questions or concerns.  This note was electronically signed.    Loni Muse, MD  04/28/2022 9:37 AM

## 2022-05-02 ENCOUNTER — Other Ambulatory Visit: Payer: Self-pay | Admitting: Internal Medicine

## 2022-05-03 ENCOUNTER — Other Ambulatory Visit: Payer: Self-pay | Admitting: Internal Medicine

## 2022-05-15 DIAGNOSIS — Z7901 Long term (current) use of anticoagulants: Secondary | ICD-10-CM | POA: Insufficient documentation

## 2022-05-16 ENCOUNTER — Other Ambulatory Visit: Payer: Self-pay | Admitting: Internal Medicine

## 2022-05-17 MED ORDER — HYDROCODONE-ACETAMINOPHEN 5-325 MG PO TABS: 1.0000 | ORAL_TABLET | Freq: Four times a day (QID) | ORAL | 0 refills | Status: DC | PRN

## 2022-05-24 ENCOUNTER — Other Ambulatory Visit: Payer: Self-pay | Admitting: Internal Medicine

## 2022-06-09 ENCOUNTER — Other Ambulatory Visit: Payer: Self-pay | Admitting: Internal Medicine

## 2022-06-14 ENCOUNTER — Other Ambulatory Visit: Payer: Self-pay | Admitting: Internal Medicine

## 2022-06-14 MED ORDER — HYDROCODONE-ACETAMINOPHEN 5-325 MG PO TABS: 1.0000 | ORAL_TABLET | Freq: Four times a day (QID) | ORAL | 0 refills | Status: DC | PRN

## 2022-06-25 ENCOUNTER — Other Ambulatory Visit: Payer: Self-pay | Admitting: Internal Medicine

## 2022-06-30 ENCOUNTER — Other Ambulatory Visit: Payer: Self-pay | Admitting: Internal Medicine

## 2022-07-12 ENCOUNTER — Other Ambulatory Visit: Payer: Self-pay | Admitting: Internal Medicine

## 2022-07-13 ENCOUNTER — Other Ambulatory Visit: Payer: Self-pay | Admitting: Radiology

## 2022-07-13 ENCOUNTER — Encounter: Payer: Self-pay | Admitting: Internal Medicine

## 2022-07-13 MED ORDER — APIXABAN 2.5 MG PO TABS: ORAL_TABLET | ORAL | 3 refills | Status: DC

## 2022-07-13 MED ORDER — HYDROCODONE-ACETAMINOPHEN 5-325 MG PO TABS: 1.0000 | ORAL_TABLET | Freq: Four times a day (QID) | ORAL | 0 refills | Status: DC | PRN

## 2022-07-27 DIAGNOSIS — M5136 Other intervertebral disc degeneration, lumbar region: Secondary | ICD-10-CM | POA: Diagnosis not present

## 2022-07-27 DIAGNOSIS — M797 Fibromyalgia: Secondary | ICD-10-CM | POA: Diagnosis not present

## 2022-07-27 DIAGNOSIS — M0589 Other rheumatoid arthritis with rheumatoid factor of multiple sites: Secondary | ICD-10-CM | POA: Diagnosis not present

## 2022-07-27 DIAGNOSIS — M1991 Primary osteoarthritis, unspecified site: Secondary | ICD-10-CM | POA: Diagnosis not present

## 2022-07-27 DIAGNOSIS — M25421 Effusion, right elbow: Secondary | ICD-10-CM | POA: Diagnosis not present

## 2022-08-10 ENCOUNTER — Other Ambulatory Visit: Payer: Self-pay | Admitting: Internal Medicine

## 2022-08-11 MED ORDER — HYDROCODONE-ACETAMINOPHEN 5-325 MG PO TABS: 1.0000 | ORAL_TABLET | Freq: Four times a day (QID) | ORAL | 0 refills | Status: DC | PRN

## 2022-08-18 ENCOUNTER — Other Ambulatory Visit: Payer: Self-pay | Admitting: Internal Medicine

## 2022-09-12 ENCOUNTER — Encounter: Payer: Self-pay | Admitting: Internal Medicine

## 2022-09-12 MED ORDER — HYDROCODONE-ACETAMINOPHEN 5-325 MG PO TABS: 1.0000 | ORAL_TABLET | Freq: Four times a day (QID) | ORAL | 0 refills | Status: DC | PRN

## 2022-10-10 ENCOUNTER — Other Ambulatory Visit: Payer: Self-pay | Admitting: Internal Medicine

## 2022-10-10 MED ORDER — HYDROCODONE-ACETAMINOPHEN 5-325 MG PO TABS: 1.0000 | ORAL_TABLET | Freq: Four times a day (QID) | ORAL | 0 refills | Status: DC | PRN

## 2022-10-20 ENCOUNTER — Other Ambulatory Visit: Payer: Self-pay | Admitting: Internal Medicine

## 2022-10-20 DIAGNOSIS — Z1212 Encounter for screening for malignant neoplasm of rectum: Secondary | ICD-10-CM

## 2022-10-20 DIAGNOSIS — Z1211 Encounter for screening for malignant neoplasm of colon: Secondary | ICD-10-CM

## 2022-10-26 DIAGNOSIS — M0589 Other rheumatoid arthritis with rheumatoid factor of multiple sites: Secondary | ICD-10-CM | POA: Diagnosis not present

## 2022-11-06 ENCOUNTER — Other Ambulatory Visit: Payer: Self-pay

## 2022-11-06 ENCOUNTER — Other Ambulatory Visit (INDEPENDENT_AMBULATORY_CARE_PROVIDER_SITE_OTHER): Payer: Medicare Other

## 2022-11-06 DIAGNOSIS — R300 Dysuria: Secondary | ICD-10-CM

## 2022-11-07 LAB — URINALYSIS, ROUTINE W REFLEX MICROSCOPIC
Bilirubin Urine: NEGATIVE
Ketones, ur: NEGATIVE
Nitrite: POSITIVE — AB
Specific Gravity, Urine: 1.01 (ref 1.000–1.030)
Total Protein, Urine: 30 — AB
Urine Glucose: NEGATIVE
Urobilinogen, UA: 0.2 (ref 0.0–1.0)
pH: 6.5 (ref 5.0–8.0)

## 2022-11-07 MED ORDER — CEPHALEXIN 500 MG PO CAPS: 500.0000 mg | ORAL_CAPSULE | Freq: Two times a day (BID) | ORAL | 0 refills | Status: AC

## 2022-11-08 LAB — CULTURE, URINE COMPREHENSIVE

## 2022-11-15 ENCOUNTER — Other Ambulatory Visit: Payer: Self-pay | Admitting: Internal Medicine

## 2022-11-15 NOTE — Telephone Encounter (Signed)
A 30-day prescription was sent for both of these medications.  It has been over a year since I have seen her and I need to see her and she needs blood work done specially with the medications she is taking.  Please call her to schedule an appointment

## 2022-11-16 MED ORDER — HYDROCODONE-ACETAMINOPHEN 5-325 MG PO TABS: 1.0000 | ORAL_TABLET | Freq: Four times a day (QID) | ORAL | 0 refills | Status: DC | PRN

## 2022-12-17 ENCOUNTER — Other Ambulatory Visit: Payer: Self-pay | Admitting: Internal Medicine

## 2022-12-18 ENCOUNTER — Other Ambulatory Visit: Payer: Self-pay | Admitting: Internal Medicine

## 2022-12-18 MED ORDER — HYDROCODONE-ACETAMINOPHEN 5-325 MG PO TABS: 1.0000 | ORAL_TABLET | Freq: Four times a day (QID) | ORAL | 0 refills | Status: DC | PRN

## 2023-01-01 ENCOUNTER — Ambulatory Visit: Payer: Medicare Other | Admitting: Internal Medicine

## 2023-01-17 ENCOUNTER — Other Ambulatory Visit: Payer: Self-pay | Admitting: Internal Medicine

## 2023-01-19 ENCOUNTER — Other Ambulatory Visit: Payer: Self-pay | Admitting: Internal Medicine

## 2023-01-19 MED ORDER — APIXABAN 2.5 MG PO TABS: 2.5000 mg | ORAL_TABLET | Freq: Two times a day (BID) | ORAL | 1 refills | Status: DC

## 2023-01-19 MED ORDER — HYDROCODONE-ACETAMINOPHEN 5-325 MG PO TABS: 1.0000 | ORAL_TABLET | Freq: Four times a day (QID) | ORAL | 0 refills | Status: DC | PRN

## 2023-01-19 NOTE — Telephone Encounter (Signed)
#  90 sent since PCP is out of office.

## 2023-01-25 DIAGNOSIS — M0589 Other rheumatoid arthritis with rheumatoid factor of multiple sites: Secondary | ICD-10-CM | POA: Diagnosis not present

## 2023-01-25 DIAGNOSIS — M1991 Primary osteoarthritis, unspecified site: Secondary | ICD-10-CM | POA: Diagnosis not present

## 2023-01-25 DIAGNOSIS — M797 Fibromyalgia: Secondary | ICD-10-CM | POA: Diagnosis not present

## 2023-01-25 DIAGNOSIS — M25421 Effusion, right elbow: Secondary | ICD-10-CM | POA: Diagnosis not present

## 2023-01-25 DIAGNOSIS — M5136 Other intervertebral disc degeneration, lumbar region with discogenic back pain only: Secondary | ICD-10-CM | POA: Diagnosis not present

## 2023-01-30 ENCOUNTER — Encounter: Payer: Self-pay | Admitting: Internal Medicine

## 2023-01-30 NOTE — Progress Notes (Signed)
      Subjective:    Patient ID: Haley Roberts, female    DOB: 06/09/1945, 78 y.o.   MRN: [id number]     HPI Haley Roberts is here for follow up of her chronic medical problems.    Medications and allergies reviewed with patient and updated if appropriate.  Current Outpatient Medications on File Prior to Visit  Medication Sig Dispense Refill  . leflunomide (ARAVA) 10 MG tablet Take 1 tablet (10 mg total) by mouth daily. 30 tablet 0  . predniSONE (DELTASONE) 5 MG tablet TAKE 1 TABLET(5 MG) BY MOUTH DAILY 90 tablet 0  . vitamin C (ASCORBIC ACID) 500 MG tablet Take 500 mg by mouth daily.      . vitamin E 400 UNIT capsule Take 400 Units by mouth daily.       No current facility-administered medications on file prior to visit.     Review of Systems     Objective:  There were no vitals filed for this visit. BP Readings from Last 3 Encounters:  02/13/23 (!) 158/80  04/28/22 (!) 144/83  11/18/21 (!) 144/90   Wt Readings from Last 3 Encounters:  07/06/21 145 lb 11.6 oz (66.1 kg)  12/08/20 145 lb 12.8 oz (66.1 kg)  09/24/20 145 lb 8.1 oz (66 kg)   There is no height or weight on file to calculate BMI.    Physical Exam     Lab Results  Component Value Date   WBC 4.8 02/13/2023   HGB 11.2 (L) 02/13/2023   HCT 34.9 (L) 02/13/2023   PLT 347.0 02/13/2023   GLUCOSE 101 (H) 02/13/2023   CHOL 168 03/02/2016   TRIG 108 03/02/2016   HDL 66 03/02/2016   LDLCALC 80 03/02/2016   ALT 23 02/13/2023   AST 26 02/13/2023   NA 138 02/13/2023   K 4.2 02/13/2023   CL 104 02/13/2023   CREATININE 0.80 02/13/2023   BUN 16 02/13/2023   CO2 26 02/13/2023   TSH 2.900 02/16/2021   INR 1.1 09/09/2021   HGBA1C 6.4 02/13/2023   MICROALBUR 0.2 08/22/2007     Assessment & Plan:    See Problem List for Assessment and Plan of chronic medical problems.    This encounter was created in error - please disregard.

## 2023-01-30 NOTE — Patient Instructions (Addendum)
      Blood work was ordered.       Medications changes include :   None    A referral was ordered and someone will call you to schedule an appointment.     Return in about 1 year (around 01/31/2024) for follow up.

## 2023-01-31 ENCOUNTER — Encounter: Payer: Medicare Other | Admitting: Internal Medicine

## 2023-01-31 DIAGNOSIS — R600 Localized edema: Secondary | ICD-10-CM

## 2023-01-31 DIAGNOSIS — R7303 Prediabetes: Secondary | ICD-10-CM

## 2023-01-31 DIAGNOSIS — Z86718 Personal history of other venous thrombosis and embolism: Secondary | ICD-10-CM

## 2023-01-31 DIAGNOSIS — I10 Essential (primary) hypertension: Secondary | ICD-10-CM

## 2023-01-31 DIAGNOSIS — F3289 Other specified depressive episodes: Secondary | ICD-10-CM

## 2023-01-31 DIAGNOSIS — M069 Rheumatoid arthritis, unspecified: Secondary | ICD-10-CM

## 2023-02-01 ENCOUNTER — Encounter: Payer: Self-pay | Admitting: Internal Medicine

## 2023-02-01 MED ORDER — HYDROCODONE-ACETAMINOPHEN 5-325 MG PO TABS: 1.0000 | ORAL_TABLET | Freq: Four times a day (QID) | ORAL | 0 refills | Status: DC | PRN

## 2023-02-07 ENCOUNTER — Ambulatory Visit: Payer: Medicare Other | Admitting: Internal Medicine

## 2023-02-12 DIAGNOSIS — G894 Chronic pain syndrome: Secondary | ICD-10-CM | POA: Insufficient documentation

## 2023-02-12 NOTE — Assessment & Plan Note (Addendum)
Chronic Likely multifactorial Rheumatoid arthritis, osteoarthritis, chronic back pain s/p surgery 2022 with complications In addition to physical deconditioning, being sedentary On Lyrica 75 mg at bedtime On hydrocodone 5-325 mg taking 1-2 tablets every 6 hours as needed Continue above regimen [place] controlled substance database checked and she is taking medication appropriately

## 2023-02-12 NOTE — Patient Instructions (Incomplete)
      Blood work was ordered.       Medications changes include :   None  .     Return in about 6 months (around 08/13/2023) for follow up.

## 2023-02-12 NOTE — Progress Notes (Unsigned)
Subjective:    Patient ID: Haley Roberts, female    DOB: [birth year], 78 y.o.   MRN: [id number]     HPI Haley Roberts is here for follow up of her chronic medical problems.  Cold symptoms -she does started having cold symptoms last night.  She believes it is related to her husband's caregiver that came in with cold symptoms.  Her son also got it.  She states nasal congestion, cough  Takes 2 tabs of hydrocodone every 6 hrs. she feels the medication does help.  Nothing takes the pain away, but it does help her tolerate the pain.  Stopped PT last year because she was working with advised doing the exercises on her own-most likely there was not further progress.  She is interested in doing additional physical therapy at some point, but needs to figure out what would be best for her.  In wheelchair most of day - legs down.  She is in bed at night.  She does have daily leg swelling-gets a little bit better overnight.  It is controlled with the Lasix.  No skin issues  Medications and allergies reviewed with patient and updated if appropriate.  Current Outpatient Medications on File Prior to Visit  Medication Sig Dispense Refill   apixaban (ELIQUIS) 2.5 MG TABS tablet Take 1 tablet (2.5 mg total) by mouth 2 (two) times daily. TAKE 1 TABLET(2.5 MG) BY MOUTH TWICE DAILY 60 tablet 1   cyclobenzaprine (FLEXERIL) 10 MG tablet TAKE 1 TABLET(10 MG) BY MOUTH THREE TIMES DAILY AS NEEDED FOR MUSCLE SPASMS 90 tablet 5   desvenlafaxine (PRISTIQ) 100 MG 24 hr tablet Take 100 mg by mouth every morning.     furosemide (LASIX) 20 MG tablet TAKE 1 TABLET(20 MG) BY MOUTH DAILY AS NEEDED 30 tablet 5   HYDROcodone-acetaminophen (NORCO/VICODIN) 5-325 MG tablet Take 1-2 tablets by mouth every 6 (six) hours as needed for moderate pain (pain score 4-6). 60 tablet 0   leflunomide (ARAVA) 10 MG tablet Take 1 tablet (10 mg total) by mouth daily. 30 tablet 0   losartan (COZAAR) 25 MG tablet TAKE 1 TABLET(25 MG) BY  MOUTH DAILY 90 tablet 3   predniSONE (DELTASONE) 5 MG tablet TAKE 1 TABLET(5 MG) BY MOUTH DAILY 90 tablet 0   pregabalin (LYRICA) 75 MG capsule TAKE 1 CAPSULE(75 MG) BY MOUTH AT BEDTIME 30 capsule 0   vitamin C (ASCORBIC ACID) 500 MG tablet Take 500 mg by mouth daily.       vitamin E 400 UNIT capsule Take 400 Units by mouth daily.       No current facility-administered medications on file prior to visit.     Review of Systems  Constitutional:  Negative for chills and fever.  HENT:  Positive for congestion. Negative for ear pain, sinus pain and sore throat.   Respiratory:  Positive for cough. Negative for shortness of breath and wheezing.   Cardiovascular:  Positive for leg swelling (chronic). Negative for chest pain and palpitations.  Gastrointestinal:  Negative for abdominal pain, blood in stool, constipation and diarrhea.       Occ gerd  Neurological:  Negative for light-headedness and headaches.       Objective:   Vitals:   02/13/23 1028  BP: (!) 158/80  Pulse: 90  Temp: 98.6 F (37 C)  SpO2: 93%   BP Readings from Last 3 Encounters:  02/13/23 (!) 158/80  04/28/22 (!) 144/83  11/18/21 (!) 144/90   Wt Readings from  Last 3 Encounters:  07/06/21 145 lb 11.6 oz (66.1 kg)  12/08/20 145 lb 12.8 oz (66.1 kg)  09/24/20 145 lb 8.1 oz (66 kg)   Body mass index is 26.65 kg/m.    Physical Exam     Lab Results  Component Value Date   WBC 5.4 09/09/2021   HGB 10.0 (L) 09/09/2021   HCT 31.5 (L) 09/09/2021   PLT 435 (H) 09/09/2021   GLUCOSE 118 (H) 09/09/2021   CHOL 168 03/02/2016   TRIG 108 03/02/2016   HDL 66 03/02/2016   LDLCALC 80 03/02/2016   ALT 12 09/09/2021   AST 18 09/09/2021   NA 140 09/09/2021   K 3.9 09/09/2021   CL 106 09/09/2021   CREATININE 0.77 09/09/2021   BUN 21 09/09/2021   CO2 27 09/09/2021   TSH 2.900 02/16/2021   INR 1.1 09/09/2021   HGBA1C 5.6 02/16/2021   MICROALBUR 0.2 08/22/2007     Assessment & Plan:    See Problem List for  Assessment and Plan of chronic medical problems.

## 2023-02-13 ENCOUNTER — Encounter: Payer: Self-pay | Admitting: Internal Medicine

## 2023-02-13 ENCOUNTER — Ambulatory Visit (INDEPENDENT_AMBULATORY_CARE_PROVIDER_SITE_OTHER): Payer: Medicare Other | Admitting: Internal Medicine

## 2023-02-13 VITALS — BP 158/80 | HR 90 | Temp 98.6°F | Ht 62.0 in

## 2023-02-13 DIAGNOSIS — M069 Rheumatoid arthritis, unspecified: Secondary | ICD-10-CM | POA: Diagnosis not present

## 2023-02-13 DIAGNOSIS — R7303 Prediabetes: Secondary | ICD-10-CM

## 2023-02-13 DIAGNOSIS — F3289 Other specified depressive episodes: Secondary | ICD-10-CM

## 2023-02-13 DIAGNOSIS — Z86718 Personal history of other venous thrombosis and embolism: Secondary | ICD-10-CM

## 2023-02-13 DIAGNOSIS — I10 Essential (primary) hypertension: Secondary | ICD-10-CM | POA: Diagnosis not present

## 2023-02-13 DIAGNOSIS — R600 Localized edema: Secondary | ICD-10-CM | POA: Diagnosis not present

## 2023-02-13 DIAGNOSIS — J069 Acute upper respiratory infection, unspecified: Secondary | ICD-10-CM

## 2023-02-13 DIAGNOSIS — G894 Chronic pain syndrome: Secondary | ICD-10-CM | POA: Diagnosis not present

## 2023-02-13 DIAGNOSIS — I82563 Chronic embolism and thrombosis of calf muscular vein, bilateral: Secondary | ICD-10-CM

## 2023-02-13 LAB — COMPREHENSIVE METABOLIC PANEL
ALT: 23 U/L (ref 0–35)
AST: 26 U/L (ref 0–37)
Albumin: 3.9 g/dL (ref 3.5–5.2)
Alkaline Phosphatase: 102 U/L (ref 39–117)
BUN: 16 mg/dL (ref 6–23)
CO2: 26 meq/L (ref 19–32)
Calcium: 9 mg/dL (ref 8.4–10.5)
Chloride: 104 meq/L (ref 96–112)
Creatinine, Ser: 0.8 mg/dL (ref 0.40–1.20)
GFR: 72.07 mL/min (ref >60.00)
Glucose, Bld: 101 mg/dL — ABNORMAL HIGH (ref 70–99)
Potassium: 4.2 meq/L (ref 3.5–5.1)
Sodium: 138 meq/L (ref 135–145)
Total Bilirubin: 0.4 mg/dL (ref 0.2–1.2)
Total Protein: 7.2 g/dL (ref 6.0–8.3)

## 2023-02-13 LAB — CBC WITH DIFFERENTIAL/PLATELET
Basophils Absolute: 0.1 10*3/uL (ref 0.0–0.1)
Basophils Relative: 1.4 % (ref 0.0–3.0)
Eosinophils Absolute: 0.1 10*3/uL (ref 0.0–0.7)
Eosinophils Relative: 2.6 % (ref 0.0–5.0)
HCT: 34.9 % — ABNORMAL LOW (ref 36.0–46.0)
Hemoglobin: 11.2 g/dL — ABNORMAL LOW (ref 12.0–15.0)
Lymphocytes Relative: 13.3 % (ref 12.0–46.0)
Lymphs Abs: 0.6 10*3/uL — ABNORMAL LOW (ref 0.7–4.0)
MCHC: 32.1 g/dL (ref 30.0–36.0)
MCV: 93.3 fL (ref 78.0–100.0)
Monocytes Absolute: 0.5 10*3/uL (ref 0.1–1.0)
Monocytes Relative: 10.6 % (ref 3.0–12.0)
Neutro Abs: 3.5 10*3/uL (ref 1.4–7.7)
Neutrophils Relative %: 72.1 % (ref 43.0–77.0)
Platelets: 347 10*3/uL (ref 150.0–400.0)
RBC: 3.74 Mil/uL — ABNORMAL LOW (ref 3.87–5.11)
RDW: 16.1 % — ABNORMAL HIGH (ref 11.5–15.5)
WBC: 4.8 10*3/uL (ref 4.0–10.5)

## 2023-02-13 LAB — HEMOGLOBIN A1C: Hgb A1c MFr Bld: 6.4 % (ref 4.6–6.5)

## 2023-02-13 MED ORDER — CYCLOBENZAPRINE HCL 10 MG PO TABS: 10.0000 mg | ORAL_TABLET | Freq: Two times a day (BID) | ORAL | 3 refills | Status: AC | PRN

## 2023-02-13 MED ORDER — FUROSEMIDE 20 MG PO TABS: ORAL_TABLET | ORAL | 3 refills | Status: AC

## 2023-02-13 MED ORDER — HYDROCODONE-ACETAMINOPHEN 5-325 MG PO TABS: 1.0000 | ORAL_TABLET | Freq: Four times a day (QID) | ORAL | 0 refills | Status: DC | PRN

## 2023-02-13 MED ORDER — PREGABALIN 75 MG PO CAPS: ORAL_CAPSULE | ORAL | 0 refills | Status: DC

## 2023-02-13 MED ORDER — LOSARTAN POTASSIUM 25 MG PO TABS: ORAL_TABLET | ORAL | 3 refills | Status: DC

## 2023-02-13 MED ORDER — APIXABAN 2.5 MG PO TABS: 2.5000 mg | ORAL_TABLET | Freq: Two times a day (BID) | ORAL | 3 refills | Status: AC

## 2023-02-13 NOTE — Assessment & Plan Note (Signed)
Chronic Lab Results  Component Value Date   HGBA1C 5.6 02/16/2021   Check a1c

## 2023-02-13 NOTE — Assessment & Plan Note (Signed)
Chronic Management per Dr. Dierdre Forth On prednisone 5 mg daily and leflunomide 10 mg daily I will continue her hydrocodone 1-2 tabs every 6 hours as needed for severe pain-this medication is well-tolerated and I believe the benefits outweigh the risks as she does [place] controlled substance database checked and she is taking the medication appropriately She will continue to have chronic pain and will need chronic pain management likely for the rest of her life

## 2023-02-13 NOTE — Assessment & Plan Note (Signed)
Recurrent DVT-left lower extremity in 08/2021 and right lower extremity in 2022 Completed treatment dose of Eliquis Currently on Eliquis 2.5 mg twice daily-will continue long-term CBC, CMP

## 2023-02-13 NOTE — Assessment & Plan Note (Signed)
Chronic Has bilateral leg swelling-history of bilateral DVTs at 2 different times, sedentary and has her legs down most of the time Continue Lasix 20 mg daily as needed

## 2023-02-13 NOTE — Assessment & Plan Note (Signed)
Acute Symptoms started last night making it very difficult to know if this is viral or bacterial At this point I would recommend symptomatic treatment with over-the-counter medications If her symptoms worsen or are not improving she will let me know-you do have a low tolerance for starting an antibiotic since she is immunocompromised

## 2023-02-13 NOTE — Assessment & Plan Note (Signed)
Chronic Controlled, stable Continue Pristiq 100 mg daily

## 2023-02-13 NOTE — Assessment & Plan Note (Signed)
Chronic BP elevated here today - has whitecoat htn in doctor's offices Has not been checking it recently and advised her to check it regularly for a week or 2 No change in medication Continue losartan 25 mg daily CBC, CMP

## 2023-02-14 ENCOUNTER — Encounter: Payer: Self-pay | Admitting: Internal Medicine

## 2023-02-15 MED ORDER — AMOXICILLIN-POT CLAVULANATE 875-125 MG PO TABS: 1.0000 | ORAL_TABLET | Freq: Two times a day (BID) | ORAL | 0 refills | Status: AC

## 2023-03-14 ENCOUNTER — Other Ambulatory Visit: Payer: Self-pay | Admitting: Internal Medicine

## 2023-03-14 MED ORDER — HYDROCODONE-ACETAMINOPHEN 5-325 MG PO TABS: 1.0000 | ORAL_TABLET | Freq: Four times a day (QID) | ORAL | 0 refills | Status: DC | PRN

## 2023-03-19 ENCOUNTER — Other Ambulatory Visit: Payer: Self-pay | Admitting: Internal Medicine

## 2023-03-21 ENCOUNTER — Other Ambulatory Visit: Payer: Self-pay | Admitting: Internal Medicine

## 2023-03-22 ENCOUNTER — Encounter: Payer: Self-pay | Admitting: Internal Medicine

## 2023-03-23 ENCOUNTER — Other Ambulatory Visit: Payer: Self-pay | Admitting: Family Medicine

## 2023-03-23 NOTE — Progress Notes (Signed)
 This encounter was created in error - please disregard.  CALLED PT X3 W/NO ANSWER.  LDT MESSAGE FOR PT TO CALL OFFICE TO RESCHEDULE VISIT.

## 2023-03-27 ENCOUNTER — Other Ambulatory Visit: Payer: Self-pay | Admitting: Internal Medicine

## 2023-03-27 ENCOUNTER — Encounter: Payer: Self-pay | Admitting: Internal Medicine

## 2023-03-27 ENCOUNTER — Other Ambulatory Visit (INDEPENDENT_AMBULATORY_CARE_PROVIDER_SITE_OTHER)

## 2023-03-27 DIAGNOSIS — R300 Dysuria: Secondary | ICD-10-CM | POA: Diagnosis not present

## 2023-03-27 LAB — URINALYSIS, ROUTINE W REFLEX MICROSCOPIC
Bilirubin Urine: NEGATIVE
Hgb urine dipstick: NEGATIVE
Ketones, ur: NEGATIVE
Leukocytes,Ua: NEGATIVE
Nitrite: NEGATIVE
RBC / HPF: NONE SEEN (ref 0–?)
Specific Gravity, Urine: 1.015 (ref 1.000–1.030)
Urine Glucose: NEGATIVE
Urobilinogen, UA: 0.2 (ref 0.0–1.0)
pH: 7 (ref 5.0–8.0)

## 2023-03-28 ENCOUNTER — Encounter: Payer: Self-pay | Admitting: Internal Medicine

## 2023-03-28 ENCOUNTER — Telehealth: Admitting: Nurse Practitioner

## 2023-03-28 ENCOUNTER — Ambulatory Visit: Payer: Self-pay | Admitting: Internal Medicine

## 2023-03-28 DIAGNOSIS — R110 Nausea: Secondary | ICD-10-CM | POA: Diagnosis not present

## 2023-03-28 DIAGNOSIS — R300 Dysuria: Secondary | ICD-10-CM | POA: Diagnosis not present

## 2023-03-28 LAB — URINE CULTURE

## 2023-03-28 MED ORDER — ONDANSETRON HCL 4 MG PO TABS: 4.0000 mg | ORAL_TABLET | Freq: Three times a day (TID) | ORAL | 0 refills | Status: AC | PRN

## 2023-03-28 NOTE — Assessment & Plan Note (Signed)
 Acute Most recent urinalysis from yesterday did show rare bacteria but otherwise appeared within normal limits.  Unclear as to whether or not this bacteria could be of normal flora or could be a sign of UTI.  Urine culture has ordered but for some reason has not been run, lab was called in because urinalysis was dropped off greater than 24 hours ago additional sample will be needed.  Patient has a home health aide who is willing to take a sample to the office tomorrow for further testing.  Will reorder urinalysis with urine culture, further recommendations may be made based upon these results.

## 2023-03-28 NOTE — Telephone Encounter (Signed)
 Haley Roberts

## 2023-03-28 NOTE — Telephone Encounter (Signed)
  Chief Complaint: nausea Symptoms: nausea Frequency: mild Pertinent Negatives: Patient denies vomiting Disposition: [] ED /[] Urgent Care (no appt availability in office) / [x] Appointment(In office/virtual)/ []  [facility]/ [] Home Care/ [] Refused Recommended Disposition /[] [facility]/ []  Follow-up with PCP Additional Notes: Patient states that she has been having nausea since Sunday.  She is drinking fluids but not really eating anything except crackers. Last meal was yesterday but only ate a small amount. Patient states that she thought it was a UTI but had UA done yesterday and that was negative. Patient states not actually vomiting just very nauseous.    Copied From CRM [id number]. Reason for Triage: Intense nausea, feeling hot and cold. Patient was negative for a UTI. Needs some kind of medication for nausea.  Reason for Disposition  Taking prescription medication that could cause nausea (e.g., narcotics/opiates, antibiotics, OCPs, many others)  Answer Assessment - Initial Assessment Questions 1. NAUSEA SEVERITY: "How bad is the nausea?" (e.g., mild, moderate, severe; dehydration, weight loss)   - MILD: loss of appetite without change in eating habits   - MODERATE: decreased oral intake without significant weight loss, dehydration, or malnutrition   - SEVERE: inadequate caloric or fluid intake, significant weight loss, symptoms of dehydration     Mild-mod, able to keep fluid down and crackers. 2. ONSET: "When did the nausea begin?"     Sunday  3. VOMITING: "Any vomiting?" If Yes, ask: "How many times today?"     no 4. RECURRENT SYMPTOM: "Have you had nausea before?" If Yes, ask: "When was the last time?" "What happened that time?"     When she had a uti before 5. CAUSE: "What do you think is causing the nausea?"     Unknown thought it WAS a UTI but was neg  Protocols used: Nausea-A-AH

## 2023-03-28 NOTE — Telephone Encounter (Signed)
 Copied from CRM [id number]. Topic: Clinical - Medical Advice >> Mar 28, 2023 10:15 AM [place] J wrote: Reason for CRM: Patient would like to know if something could be sent in for her nausea.

## 2023-03-28 NOTE — Progress Notes (Signed)
 Established Patient Office Visit  An audio/visual tele-health visit was completed today for this patient. I connected with  Haley Roberts on 03/28/23 utilizing audio/visual technology and verified that I am speaking with the correct person using two identifiers. The patient was located at their home, and I was located at the office of [facility] at [facility] during the encounter. I discussed the limitations of evaluation and management by telemedicine. The patient expressed understanding and agreed to proceed.     Subjective   Patient ID: Haley Roberts, female    DOB: 09/06/1945  Age: 78 y.o. MRN: [id number]  Chief Complaint  Patient presents with   Nausea    Nausea with no vomiting, it started over the weekend, has gotten better and affecting her eating    Patient arrives for virtual visit for the above.  Past medical history significant for hypertension, goiter, rheumatoid arthritis, osteoarthritis, spondylolisthesis of lumbar, right knee DJD, anxiety, secondary hypercoagulable state requiring with recurrent DVT requiring chronic anticoagulation on Eliquis, prediabetes, fibromyalgia, chronic pain syndrome, anemia.  She is wheelchair-bound with limited mobility.  She called the office yesterday with complaints of dysuria this seems to be intermittent.  Urinalysis was conducted which identified rare amounts of bacteria, but no leukocytes, hematuria or nitrites.  Patient reports she has had nausea for the last 3 to 4 days without vomiting.  She has had similar symptoms with UTIs in the past, she does report using depends.  Has been experiencing some flank pain and chills.  No diarrhea or abdominal pain.     Review of Systems  Constitutional:  Positive for chills.  Gastrointestinal:  Positive for nausea. Negative for diarrhea and vomiting.  Genitourinary:  Positive for dysuria and flank pain.      Objective:     There were no vitals taken for this  visit.   Physical Exam Comprehensive physical exam not completed today as office visit was conducted remotely.  Patient appeared generally well over video.  Patient was alert and oriented, and appeared to have appropriate judgment.   No results found for any visits on 03/28/23.    The ASCVD Risk score (Arnett DK, et al., 2019) failed to calculate for the following reasons:   Cannot find a previous HDL lab   Cannot find a previous total cholesterol lab    Assessment & Plan:   Problem List Items Addressed This Visit       Other   Nausea - Primary   Acute, mild Etiology unclear.  Norovirus is circulating within the community but patient has been without diarrhea or vomiting.  Will treat with as needed Zofran and patient will return to clinic in 2 days for close monitoring.      Relevant Medications   ondansetron (ZOFRAN) 4 MG tablet   Other Relevant Orders   Urinalysis, Routine w reflex microscopic   Dysuria   Acute Most recent urinalysis from yesterday did show rare bacteria but otherwise appeared within normal limits.  Unclear as to whether or not this bacteria could be of normal flora or could be a sign of UTI.  Urine culture has ordered but for some reason has not been run, lab was called in because urinalysis was dropped off greater than 24 hours ago additional sample will be needed.  Patient has a home health aide who is willing to take a sample to the office tomorrow for further testing.  Will reorder urinalysis with urine culture, further recommendations may be made  based upon these results.      Relevant Orders   Urine Culture   Urinalysis, Routine w reflex microscopic    Return in about 2 days (around 03/30/2023).    Elenore Paddy, NP

## 2023-03-28 NOTE — Assessment & Plan Note (Signed)
 Acute, mild Etiology unclear.  Norovirus is circulating within the community but patient has been without diarrhea or vomiting.  Will treat with as needed Zofran and patient will return to clinic in 2 days for close monitoring.

## 2023-03-29 ENCOUNTER — Other Ambulatory Visit

## 2023-03-29 ENCOUNTER — Encounter: Payer: Self-pay | Admitting: Nurse Practitioner

## 2023-03-29 ENCOUNTER — Other Ambulatory Visit: Payer: Self-pay | Admitting: Nurse Practitioner

## 2023-03-29 DIAGNOSIS — R300 Dysuria: Secondary | ICD-10-CM

## 2023-03-29 DIAGNOSIS — R110 Nausea: Secondary | ICD-10-CM

## 2023-03-29 NOTE — Progress Notes (Signed)
 Reordering labs per lab request as sample was brought to lab in a nonsterile cup. Patient will be notified by lab to come pick up sterile specimen cup in which she can return the urine.

## 2023-03-30 ENCOUNTER — Other Ambulatory Visit

## 2023-03-30 ENCOUNTER — Ambulatory Visit: Admitting: Internal Medicine

## 2023-03-30 DIAGNOSIS — R110 Nausea: Secondary | ICD-10-CM | POA: Diagnosis not present

## 2023-03-30 DIAGNOSIS — R300 Dysuria: Secondary | ICD-10-CM

## 2023-03-30 LAB — URINALYSIS, ROUTINE W REFLEX MICROSCOPIC
Bilirubin Urine: NEGATIVE
Hgb urine dipstick: NEGATIVE
Ketones, ur: NEGATIVE
Leukocytes,Ua: NEGATIVE
Nitrite: NEGATIVE
Specific Gravity, Urine: 1.015 (ref 1.000–1.030)
Total Protein, Urine: NEGATIVE
Urine Glucose: NEGATIVE
Urobilinogen, UA: 0.2 (ref 0.0–1.0)
pH: 6 (ref 5.0–8.0)

## 2023-03-30 NOTE — Telephone Encounter (Signed)
 Copied from CRM [id number]. Topic: Clinical - Lab/Test Results >> Mar 30, 2023  3:20 PM Almira Coaster wrote: Reason for CRM: Patient is calling regarding her urine culture, advised that results are not in as of yet.

## 2023-03-31 LAB — URINE CULTURE

## 2023-04-04 NOTE — Telephone Encounter (Signed)
 Copied from CRM [id number]. Topic: General - Other >> Apr 04, 2023  3:55 PM Rodman Pickle T wrote: Reason for CRM: patient is needing a callback from dr burns nurse she said she had gotten a call from the nurse but  there where no notes left on the account

## 2023-04-05 NOTE — Telephone Encounter (Signed)
 Pt concern address on result note

## 2023-04-24 DIAGNOSIS — M0589 Other rheumatoid arthritis with rheumatoid factor of multiple sites: Secondary | ICD-10-CM | POA: Diagnosis not present

## 2023-04-25 ENCOUNTER — Other Ambulatory Visit: Payer: Self-pay | Admitting: Internal Medicine

## 2023-04-26 MED ORDER — HYDROCODONE-ACETAMINOPHEN 5-325 MG PO TABS: 1.0000 | ORAL_TABLET | Freq: Four times a day (QID) | ORAL | 0 refills | Status: DC | PRN

## 2023-06-06 ENCOUNTER — Encounter

## 2023-06-06 NOTE — Progress Notes (Signed)
 This encounter was created in error - please disregard.  Called x3 with no answer/ I LDM to call office to reschedule visit.

## 2023-06-21 ENCOUNTER — Other Ambulatory Visit: Payer: Self-pay | Admitting: Internal Medicine

## 2023-06-25 ENCOUNTER — Encounter: Payer: Self-pay | Admitting: Internal Medicine

## 2023-06-27 MED ORDER — HYDROCODONE-ACETAMINOPHEN 5-325 MG PO TABS: 1.0000 | ORAL_TABLET | Freq: Four times a day (QID) | ORAL | 0 refills | Status: DC | PRN

## 2023-07-01 IMAGING — DX DG TIBIA/FIBULA 2V*R*
1 series · 2 of 2 positions shown · non-contrast
Comparison: None.

CLINICAL DATA: Fall 1 week ago. Persistent right leg pain and
swelling.

EXAM:
RIGHT TIBIA AND FIBULA - 2 VIEW

[Series 1: leg · 0.14mm/px · 2 of 2 slices shown]
[im 1/2]
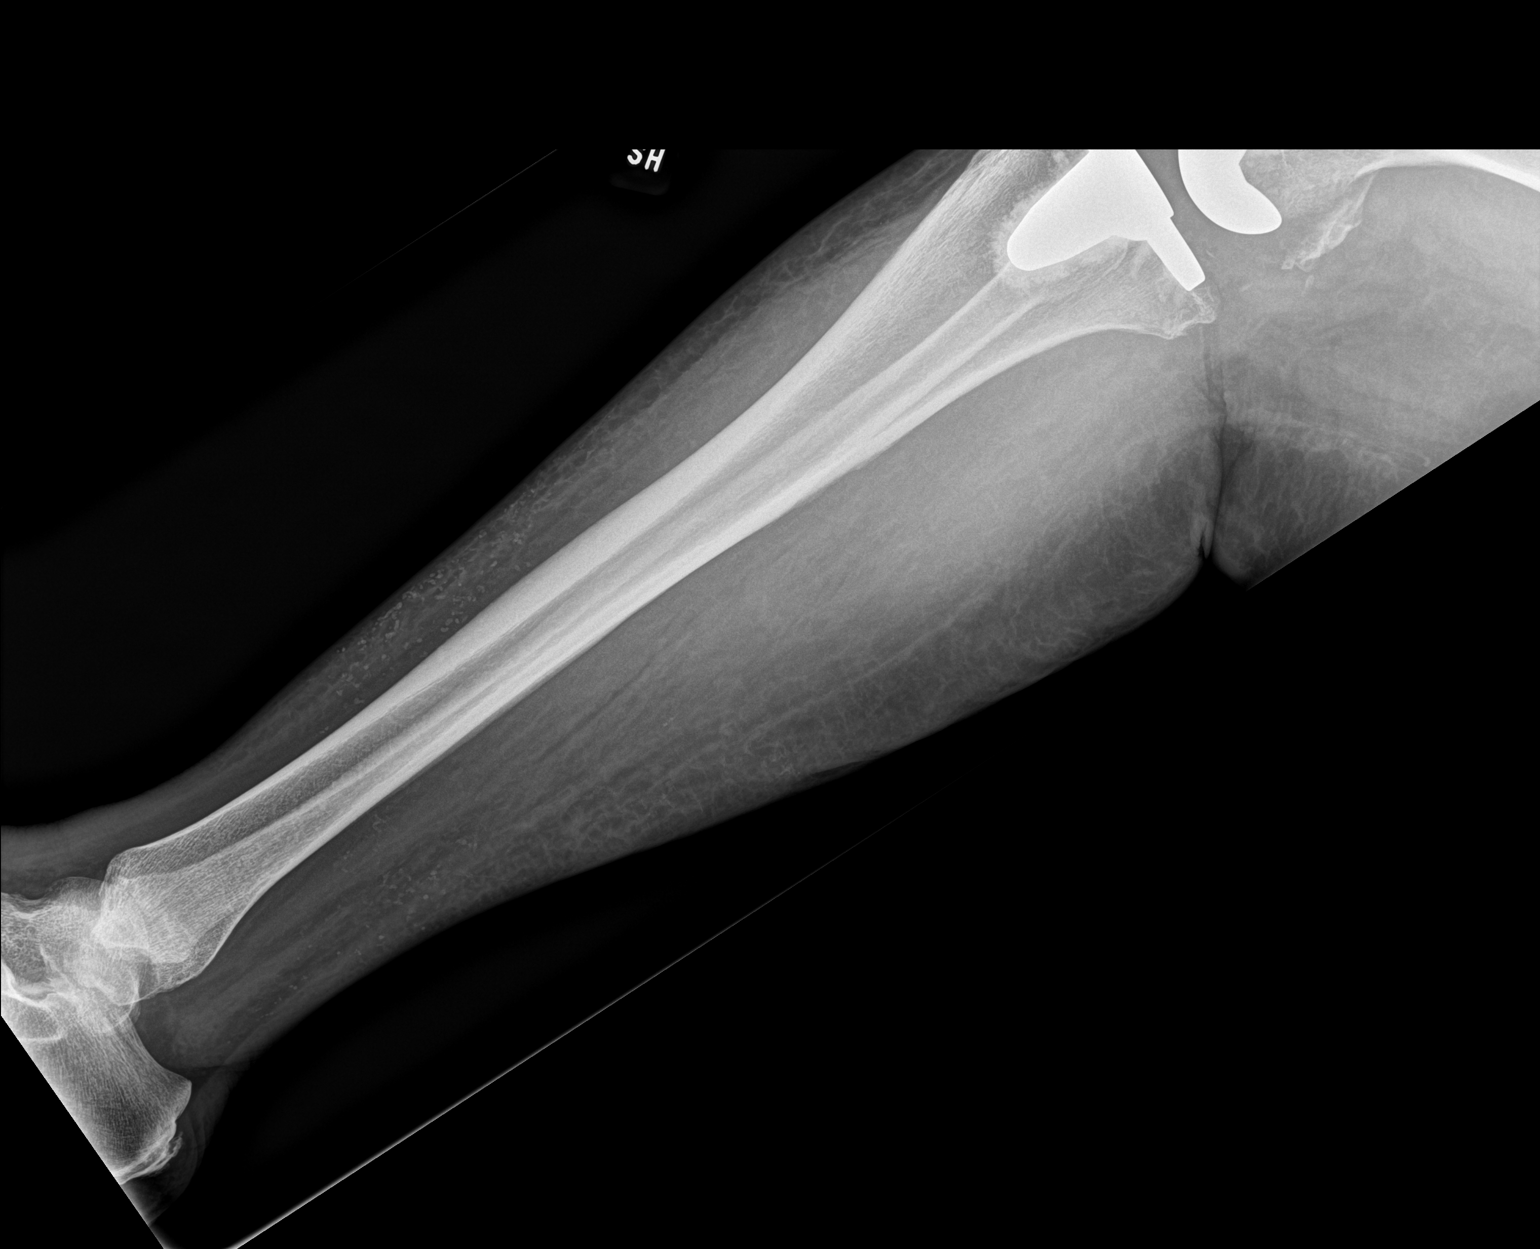
[im 2/2]
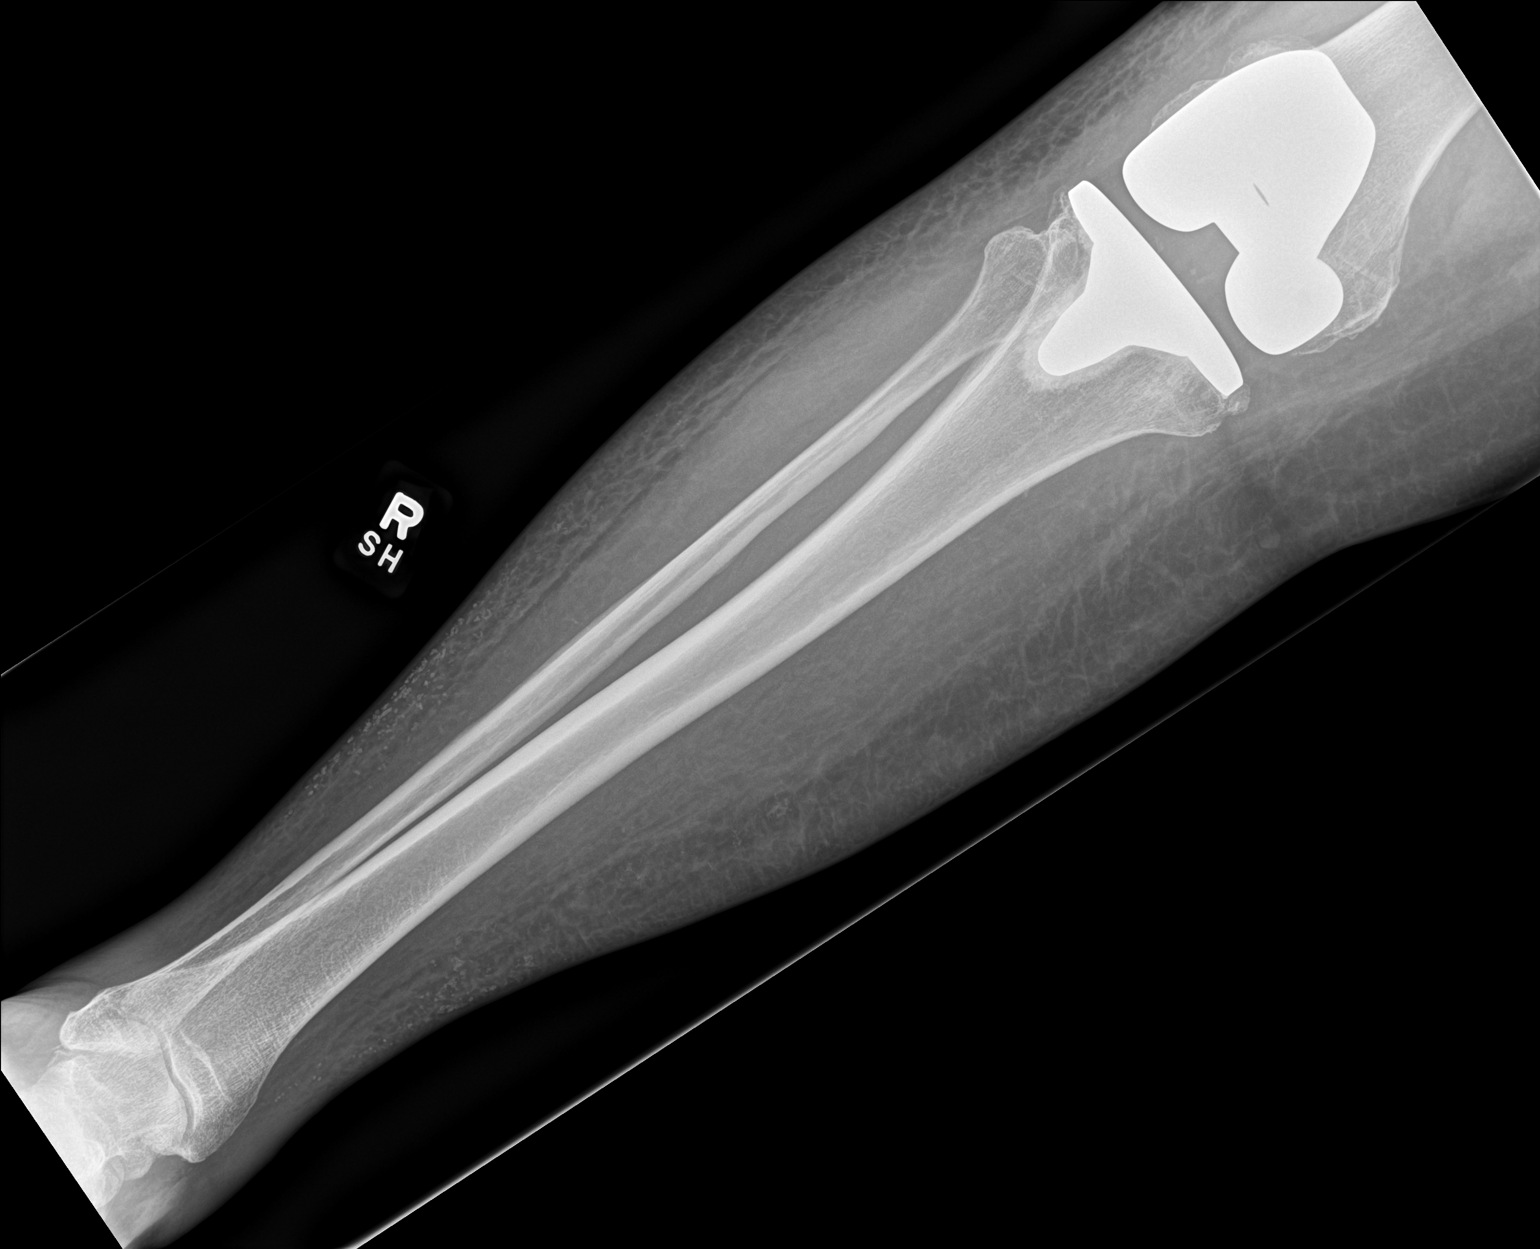

[2 of 2 positions shown; findings below may reference images not displayed]

FINDINGS: Fracture suggested of the superior aspect of the medial femoral
condyle above the femoral component of the total knee prosthesis.

No other evidence of a fracture. Specifically, no evidence of a
tibia or fibular fracture.

Knee prosthetic components appear well seated and aligned. Ankle
joint normally aligned.

There is subcutaneous soft tissue edema extending from the knee
through the ankle.
IMPRESSION: 1. Possible acute fracture of the medial femoral condyle. This would
be better evaluated with knee radiographs.
2. No other evidence of a fracture. No dislocation. No evidence of
loosening of the knee joint orthopedic hardware.
3. Nonspecific soft tissue edema.

## 2023-07-14 IMAGING — CR DG CHEST 2V
2 series · 2 of 2 positions shown · non-contrast
Comparison: 09/05/2010

CLINICAL DATA: Fever.  Post lumbar fusion 09/15/2020

EXAM:
CHEST - 2 VIEW

[chest lat]
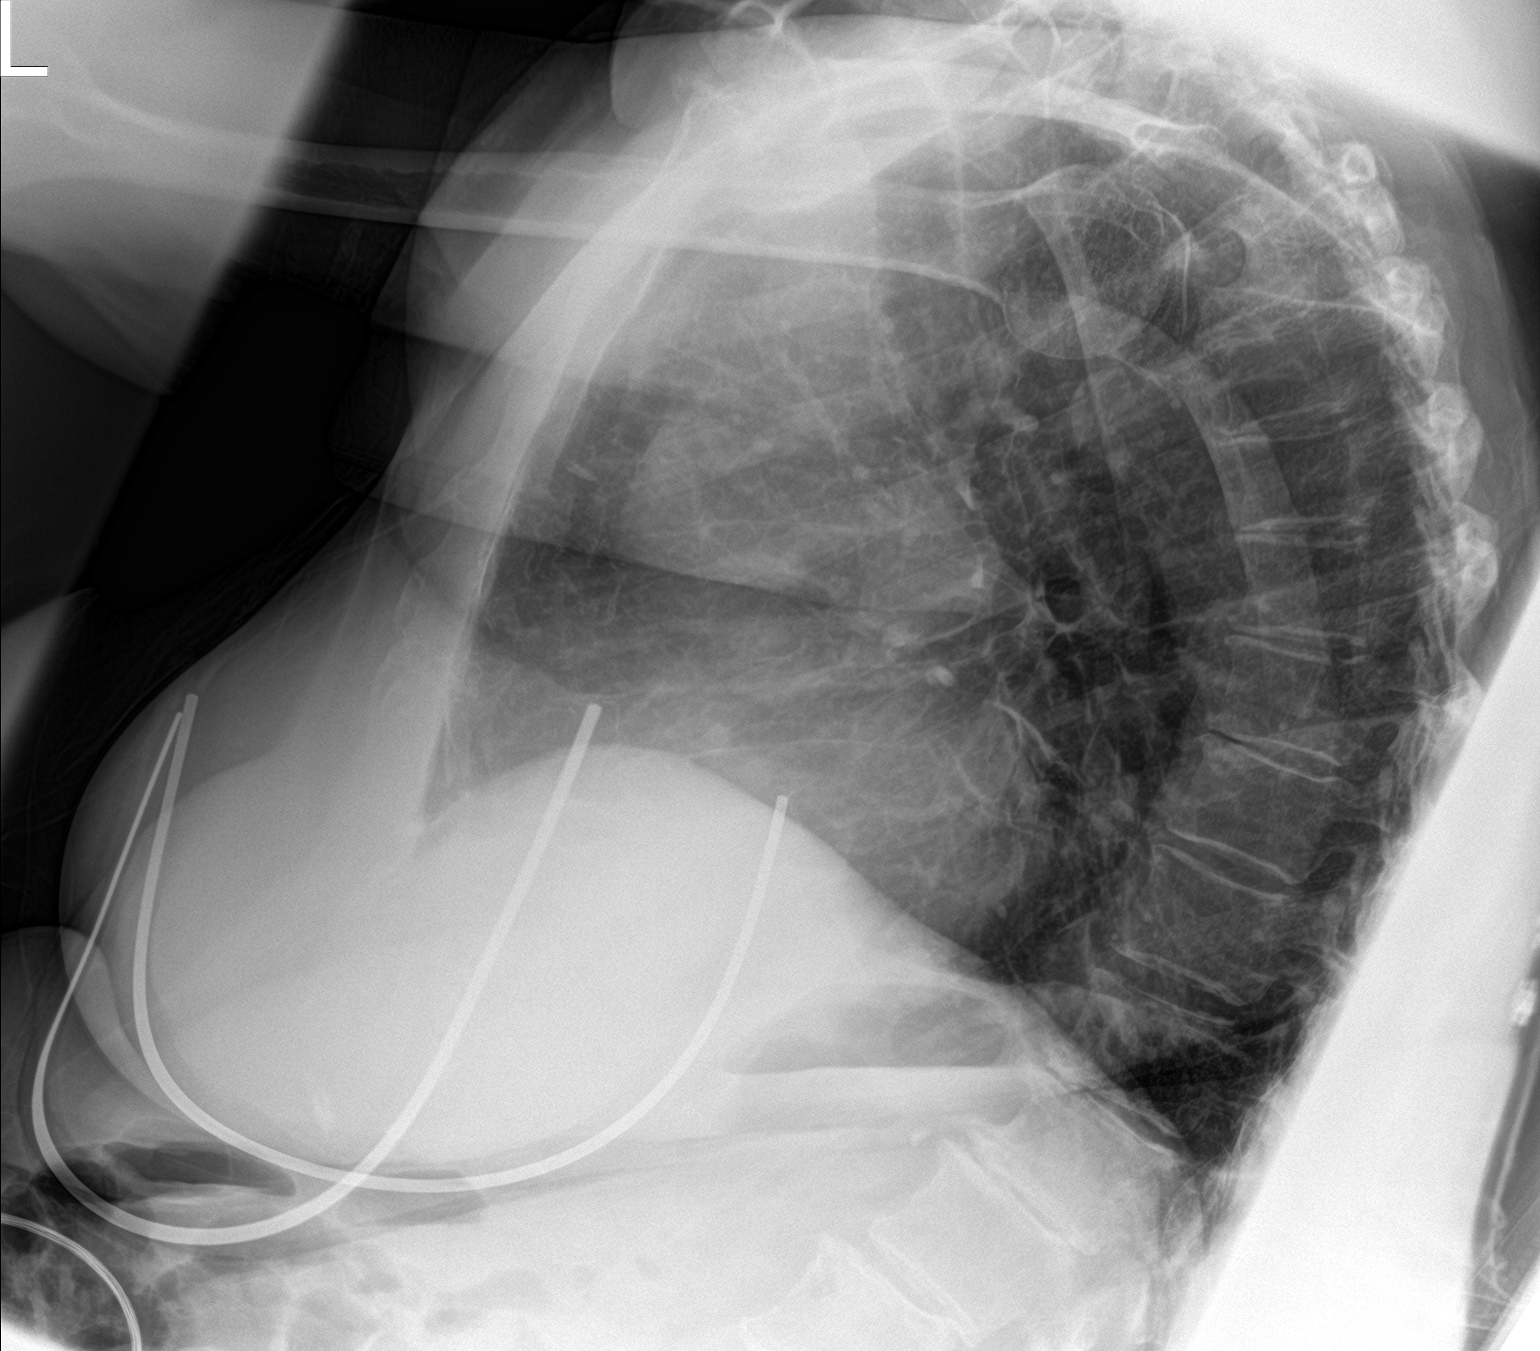

[chest ap]
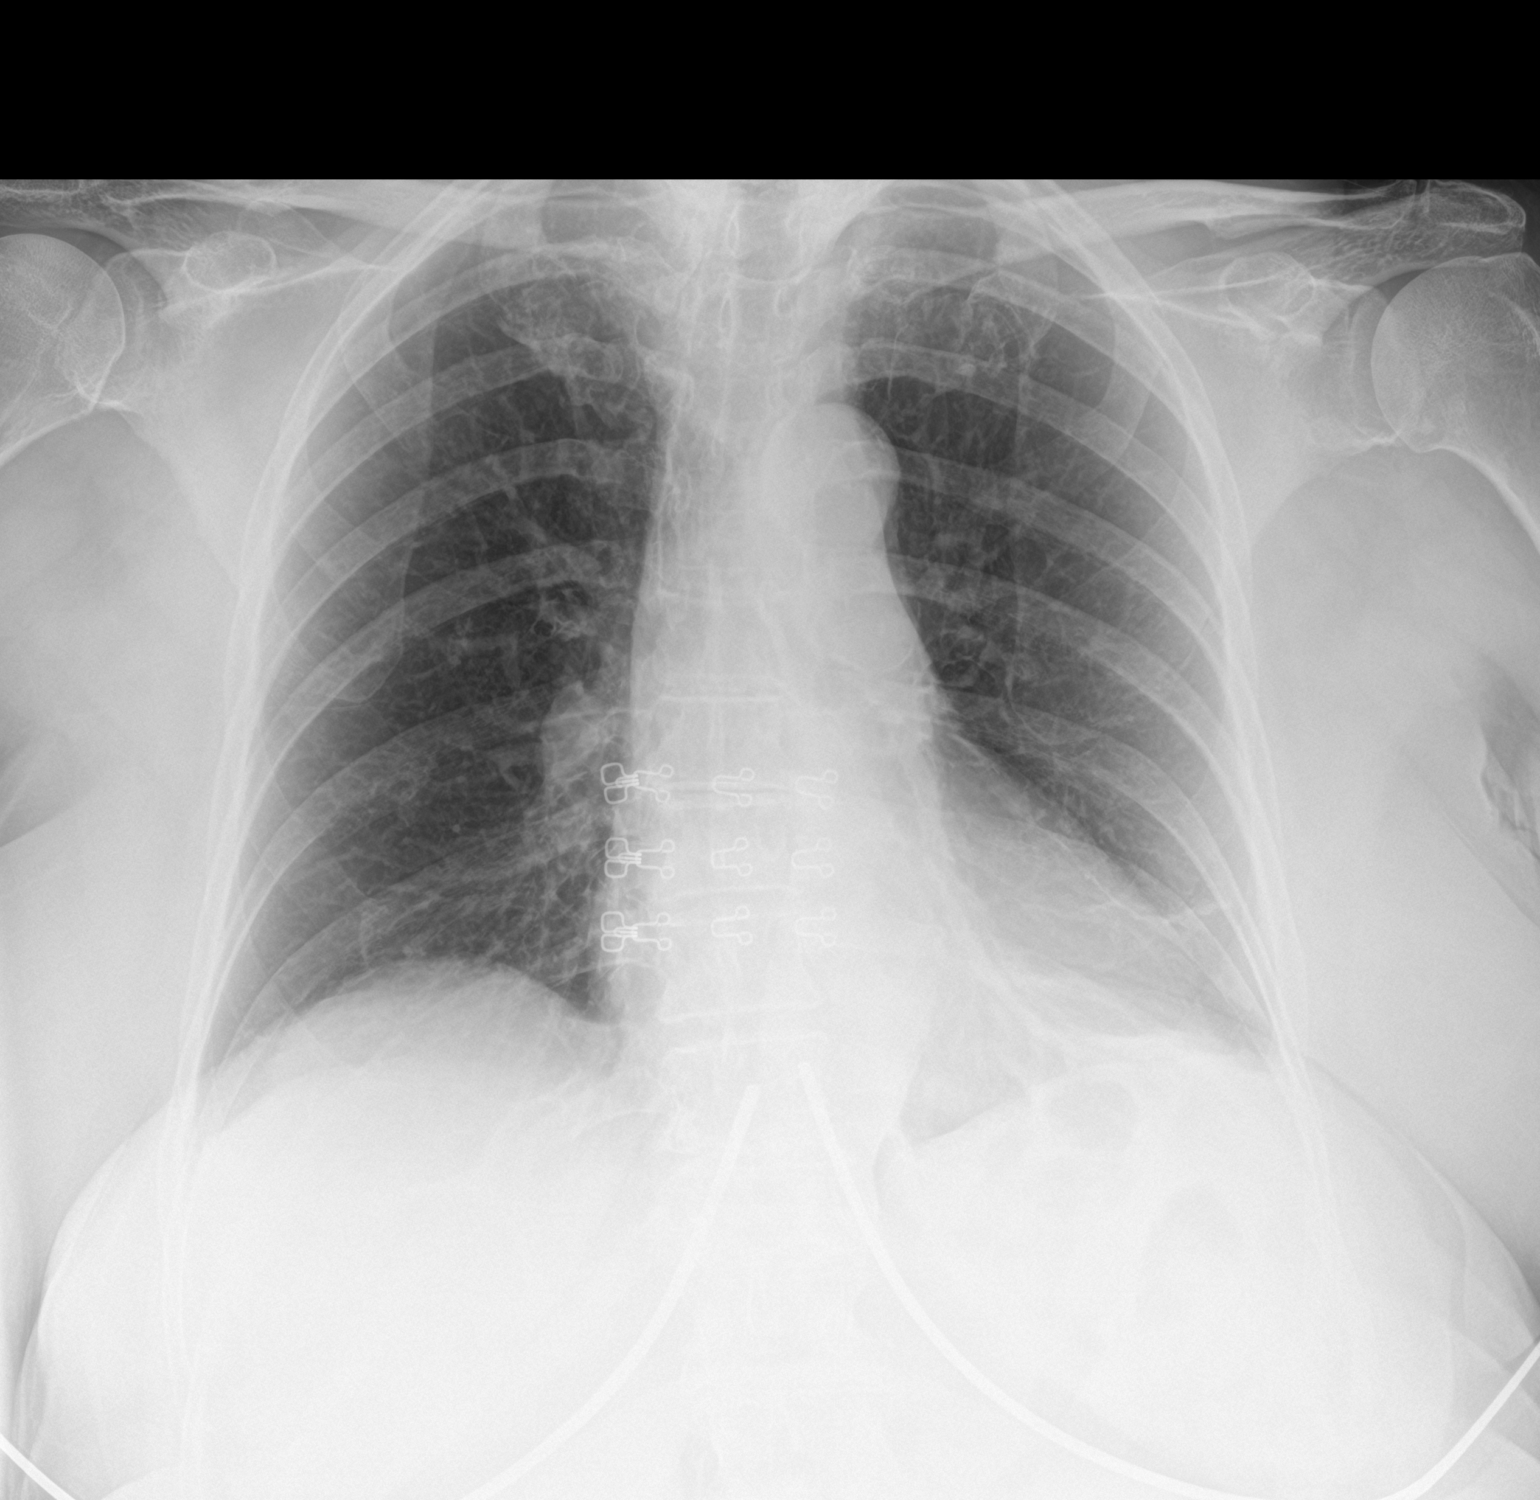

[2 of 2 positions shown; findings below may reference images not displayed]

FINDINGS: Upper normal heart size. Mild aortic tortuosity and atherosclerosis.
Streaky retrocardiac opacities typical of atelectasis. No confluent
airspace disease. No pleural effusion or pneumothorax. No pulmonary
edema. Thoracic spondylosis with endplate spurring
IMPRESSION: 1. Streaky retrocardiac opacities typical of atelectasis.
2. Mild aortic tortuosity and atherosclerosis.

## 2023-08-23 ENCOUNTER — Encounter: Payer: Self-pay | Admitting: Internal Medicine

## 2023-08-27 ENCOUNTER — Encounter: Payer: Self-pay | Admitting: Internal Medicine

## 2023-08-27 MED ORDER — HYDROCODONE-ACETAMINOPHEN 5-325 MG PO TABS: 1.0000 | ORAL_TABLET | Freq: Four times a day (QID) | ORAL | 0 refills | Status: DC | PRN

## 2023-08-29 DIAGNOSIS — M1991 Primary osteoarthritis, unspecified site: Secondary | ICD-10-CM | POA: Diagnosis not present

## 2023-08-29 DIAGNOSIS — M0589 Other rheumatoid arthritis with rheumatoid factor of multiple sites: Secondary | ICD-10-CM | POA: Diagnosis not present

## 2023-08-29 DIAGNOSIS — M797 Fibromyalgia: Secondary | ICD-10-CM | POA: Diagnosis not present

## 2023-08-29 DIAGNOSIS — M5136 Other intervertebral disc degeneration, lumbar region with discogenic back pain only: Secondary | ICD-10-CM | POA: Diagnosis not present

## 2023-09-10 ENCOUNTER — Ambulatory Visit: Admitting: Internal Medicine

## 2023-09-11 ENCOUNTER — Ambulatory Visit

## 2023-09-16 ENCOUNTER — Other Ambulatory Visit: Payer: Self-pay | Admitting: Internal Medicine

## 2023-09-21 ENCOUNTER — Ambulatory Visit: Admitting: Internal Medicine

## 2023-10-04 NOTE — Progress Notes (Unsigned)
 Subjective:    Patient ID: Haley Roberts, female    DOB: Mar 16, 1945, 78 y.o.   MRN: [id number]     HPI Haley Roberts is here for follow up of her chronic medical problems.  No changes in health.  No real concerns.  She is taking pain medication twice a day-she has decreased how much she is taking it-it does help more than not taking it.  She is concerned about her weight.  She is confined to a wheelchair and is not able to do many activities.  She did have a physical therapist for a while, but she was advised to do the exercises on her own at home which she has not been as successful with doing.  She is thinking about going to a gym and having a personal trainer that can help her do specific exercises to keep her strength.  Medications and allergies reviewed with patient and updated if appropriate.  Current Outpatient Medications on File Prior to Visit  Medication Sig Dispense Refill   apixaban  (ELIQUIS ) 2.5 MG TABS tablet Take 1 tablet (2.5 mg total) by mouth 2 (two) times daily. TAKE 1 TABLET(2.5 MG) BY MOUTH TWICE DAILY 180 tablet 3   cyclobenzaprine  (FLEXERIL ) 10 MG tablet Take 1 tablet (10 mg total) by mouth 2 (two) times daily as needed for muscle spasms. 180 tablet 3   desvenlafaxine (PRISTIQ) 100 MG 24 hr tablet Take 100 mg by mouth every morning.     furosemide  (LASIX ) 20 MG tablet TAKE 1 TABLET(20 MG) BY MOUTH DAILY AS NEEDED 90 tablet 3   leflunomide  (ARAVA ) 10 MG tablet Take 1 tablet (10 mg total) by mouth daily. 30 tablet 0   losartan  (COZAAR ) 25 MG tablet TAKE 1 TABLET(25 MG) BY MOUTH DAILY 90 tablet 3   ondansetron  (ZOFRAN ) 4 MG tablet Take 1 tablet (4 mg total) by mouth every 8 (eight) hours as needed for nausea or vomiting. 20 tablet 0   predniSONE  (DELTASONE ) 5 MG tablet TAKE 1 TABLET(5 MG) BY MOUTH DAILY 90 tablet 0   pregabalin  (LYRICA ) 75 MG capsule TAKE 1 CAPSULE(75 MG) BY MOUTH AT BEDTIME 90 capsule 0   vitamin C (ASCORBIC ACID ) 500 MG tablet Take 500 mg by  mouth daily.       vitamin E  400 UNIT capsule Take 400 Units by mouth daily.       No current facility-administered medications on file prior to visit.     Review of Systems  Constitutional:  Negative for fever.  Respiratory:  Positive for shortness of breath (a little - in afternoon when door is open). Negative for cough and wheezing.   Cardiovascular:  Positive for leg swelling (right lower leg). Negative for chest pain and palpitations.  Gastrointestinal:  Negative for abdominal pain, constipation and diarrhea.       Occ gerd  Musculoskeletal:  Positive for arthralgias.  Neurological:  Negative for light-headedness and headaches.  Psychiatric/Behavioral:  Negative for sleep disturbance (takes melatonin).        Objective:   Vitals:   10/05/23 1042  BP: (!) 154/90  Pulse: 60  Temp: 98.2 F (36.8 C)  SpO2: 94%   BP Readings from Last 3 Encounters:  10/05/23 (!) 154/90  02/13/23 (!) 158/80  04/28/22 (!) 144/83   Wt Readings from Last 3 Encounters:  07/06/21 145 lb 11.6 oz (66.1 kg)  12/08/20 145 lb 12.8 oz (66.1 kg)  09/24/20 145 lb 8.1 oz (66 kg)   Body mass  index is 26.65 kg/m.    Physical Exam Constitutional:      General: She is not in acute distress.    Appearance: Normal appearance. She is not ill-appearing.  HENT:     Head: Normocephalic and atraumatic.  Eyes:     Conjunctiva/sclera: Conjunctivae normal.  Neck:     Vascular: No carotid bruit.  Cardiovascular:     Rate and Rhythm: Normal rate and regular rhythm.  Pulmonary:     Effort: Pulmonary effort is normal. No respiratory distress.     Breath sounds: Normal breath sounds. No wheezing or rales.  Musculoskeletal:        General: Deformity (Hands-secondary to rheumatoid arthritis) present.     Cervical back: Neck supple. No tenderness.     Right lower leg: Edema (2+ pitting-just to ankle) present.     Left lower leg: Edema (Mild) present.  Lymphadenopathy:     Cervical: No cervical adenopathy.   Skin:    General: Skin is warm and dry.     Findings: No rash.  Neurological:     Mental Status: She is alert.  Psychiatric:        Mood and Affect: Mood normal.        Lab Results  Component Value Date   WBC 4.8 02/13/2023   HGB 11.2 (L) 02/13/2023   HCT 34.9 (L) 02/13/2023   PLT 347.0 02/13/2023   GLUCOSE 101 (H) 02/13/2023   CHOL 168 03/02/2016   TRIG 108 03/02/2016   HDL 66 03/02/2016   LDLCALC 80 03/02/2016   ALT 23 02/13/2023   AST 26 02/13/2023   NA 138 02/13/2023   K 4.2 02/13/2023   CL 104 02/13/2023   CREATININE 0.80 02/13/2023   BUN 16 02/13/2023   CO2 26 02/13/2023   TSH 2.900 02/16/2021   INR 1.1 09/09/2021   HGBA1C 6.4 02/13/2023   MICROALBUR 0.2 08/22/2007     Assessment & Plan:    See Problem List for Assessment and Plan of chronic medical problems.   Flu vaccine given today.

## 2023-10-04 NOTE — Patient Instructions (Addendum)
     BP goal < 140/90 --- ideally < 130/80    Blood work was ordered.       Medications changes include :   None     Return in about 6 months (around 04/03/2024) for follow up.

## 2023-10-05 ENCOUNTER — Ambulatory Visit: Admitting: Internal Medicine

## 2023-10-05 ENCOUNTER — Encounter: Payer: Self-pay | Admitting: Internal Medicine

## 2023-10-05 ENCOUNTER — Ambulatory Visit: Payer: Self-pay | Admitting: Internal Medicine

## 2023-10-05 VITALS — BP 154/90 | HR 60 | Temp 98.2°F | Ht 62.0 in

## 2023-10-05 DIAGNOSIS — R7303 Prediabetes: Secondary | ICD-10-CM

## 2023-10-05 DIAGNOSIS — R600 Localized edema: Secondary | ICD-10-CM

## 2023-10-05 DIAGNOSIS — G894 Chronic pain syndrome: Secondary | ICD-10-CM | POA: Diagnosis not present

## 2023-10-05 DIAGNOSIS — I10 Essential (primary) hypertension: Secondary | ICD-10-CM

## 2023-10-05 DIAGNOSIS — F3289 Other specified depressive episodes: Secondary | ICD-10-CM | POA: Diagnosis not present

## 2023-10-05 DIAGNOSIS — M069 Rheumatoid arthritis, unspecified: Secondary | ICD-10-CM

## 2023-10-05 DIAGNOSIS — Z23 Encounter for immunization: Secondary | ICD-10-CM

## 2023-10-05 DIAGNOSIS — Z86718 Personal history of other venous thrombosis and embolism: Secondary | ICD-10-CM

## 2023-10-05 LAB — CBC WITH DIFFERENTIAL/PLATELET
Basophils Absolute: 0 K/uL (ref 0.0–0.1)
Basophils Relative: 1 % (ref 0.0–3.0)
Eosinophils Absolute: 0.2 K/uL (ref 0.0–0.7)
Eosinophils Relative: 4.2 % (ref 0.0–5.0)
HCT: 35.8 % — ABNORMAL LOW (ref 36.0–46.0)
Hemoglobin: 11.4 g/dL — ABNORMAL LOW (ref 12.0–15.0)
Lymphocytes Relative: 15 % (ref 12.0–46.0)
Lymphs Abs: 0.8 K/uL (ref 0.7–4.0)
MCHC: 31.7 g/dL (ref 30.0–36.0)
MCV: 89.8 fl (ref 78.0–100.0)
Monocytes Absolute: 0.6 K/uL (ref 0.1–1.0)
Monocytes Relative: 12 % (ref 3.0–12.0)
Neutro Abs: 3.4 K/uL (ref 1.4–7.7)
Neutrophils Relative %: 67.8 % (ref 43.0–77.0)
Platelets: 339 K/uL (ref 150.0–400.0)
RBC: 3.99 Mil/uL (ref 3.87–5.11)
RDW: 15.4 % (ref 11.5–15.5)
WBC: 5 K/uL (ref 4.0–10.5)

## 2023-10-05 LAB — COMPREHENSIVE METABOLIC PANEL WITH GFR
ALT: 22 U/L (ref 0–35)
AST: 21 U/L (ref 0–37)
Albumin: 3.8 g/dL (ref 3.5–5.2)
Alkaline Phosphatase: 88 U/L (ref 39–117)
BUN: 19 mg/dL (ref 6–23)
CO2: 27 meq/L (ref 19–32)
Calcium: 9.1 mg/dL (ref 8.4–10.5)
Chloride: 102 meq/L (ref 96–112)
Creatinine, Ser: 0.87 mg/dL (ref 0.40–1.20)
GFR: 64.88 mL/min (ref >60.00)
Glucose, Bld: 102 mg/dL — ABNORMAL HIGH (ref 70–99)
Potassium: 4.1 meq/L (ref 3.5–5.1)
Sodium: 138 meq/L (ref 135–145)
Total Bilirubin: 0.4 mg/dL (ref 0.2–1.2)
Total Protein: 7.3 g/dL (ref 6.0–8.3)

## 2023-10-05 LAB — HEMOGLOBIN A1C: Hgb A1c MFr Bld: 6.7 % — ABNORMAL HIGH (ref 4.6–6.5)

## 2023-10-05 MED ORDER — HYDROCODONE-ACETAMINOPHEN 5-325 MG PO TABS: 1.0000 | ORAL_TABLET | Freq: Four times a day (QID) | ORAL | 0 refills | Status: DC | PRN

## 2023-10-05 NOTE — Assessment & Plan Note (Addendum)
 Chronic Has bilateral leg swelling- RLE > LLE Swelling to right lower extremity worse after back surgery history of bilateral DVTs at 2 different times, sedentary and has her legs down most of the time Continue Lasix  20 mg daily as needed-she is not taking this often because of the difficulty getting up and getting to the bathroom, but can take it as needed CMP

## 2023-10-05 NOTE — Assessment & Plan Note (Addendum)
 Chronic Likely multifactorial Rheumatoid arthritis, osteoarthritis, chronic back pain s/p surgery 2022 with complications In addition contributing are physical deconditioning, being sedentary On Lyrica  75 mg at bedtime On hydrocodone  5-325 mg taking 1-2 tablets every 6 hours as needed-typically takes 2 pills twice a day Continue above regimen   controlled substance database checked and she is taking medication appropriately

## 2023-10-05 NOTE — Assessment & Plan Note (Signed)
 Chronic BP elevated here today - has whitecoat htn in doctor's offices Has not been checking it recently and advised her to check it regularly for a week or 2 No change in medication Continue losartan 25 mg daily CBC, CMP

## 2023-10-05 NOTE — Assessment & Plan Note (Signed)
 Chronic Lab Results  Component Value Date   HGBA1C 6.4 02/13/2023   Check a1c Discussed that she is close to developing diabetes

## 2023-10-05 NOTE — Assessment & Plan Note (Signed)
 Chronic Fine with psychiatry Controlled On Pristiq 100 mg daily

## 2023-10-05 NOTE — Assessment & Plan Note (Signed)
 Recurrent DVT-left lower extremity in 08/2021 and right lower extremity in 2022 Continue Eliquis  2.5 mg twice daily CBC, CMP

## 2023-10-05 NOTE — Assessment & Plan Note (Signed)
 Chronic Management per Dr. Mai On prednisone  5 mg daily and leflunomide  10 mg daily I will continue her hydrocodone  1-2 tabs every 6 hours as needed for severe pain-this medication is well-tolerated and I believe the benefits outweigh the risks as she does

## 2023-10-07 ENCOUNTER — Encounter: Payer: Self-pay | Admitting: Internal Medicine

## 2023-11-30 DIAGNOSIS — M0589 Other rheumatoid arthritis with rheumatoid factor of multiple sites: Secondary | ICD-10-CM | POA: Diagnosis not present

## 2023-12-09 MED ORDER — HYDROCODONE-ACETAMINOPHEN 5-325 MG PO TABS: 1.0000 | ORAL_TABLET | Freq: Four times a day (QID) | ORAL | 0 refills | Status: DC | PRN

## 2023-12-09 NOTE — Addendum Note (Signed)
 Addended by: GEOFM GLADE PARAS on: 12/09/2023 08:11 PM   Modules accepted: Orders

## 2023-12-18 IMAGING — CT CT L SPINE W/O CM
3 of 6 series · 10 of 35 positions shown, 11 images · non-contrast
Comparison: Lumbar spine MRI 12/30/2020

CLINICAL DATA: Lumbar spondylolisthesis.



[Series 7: cor bone · coronal · 0.28mm/px · 1 of 96 slices shown]
[im 48/96  bone]
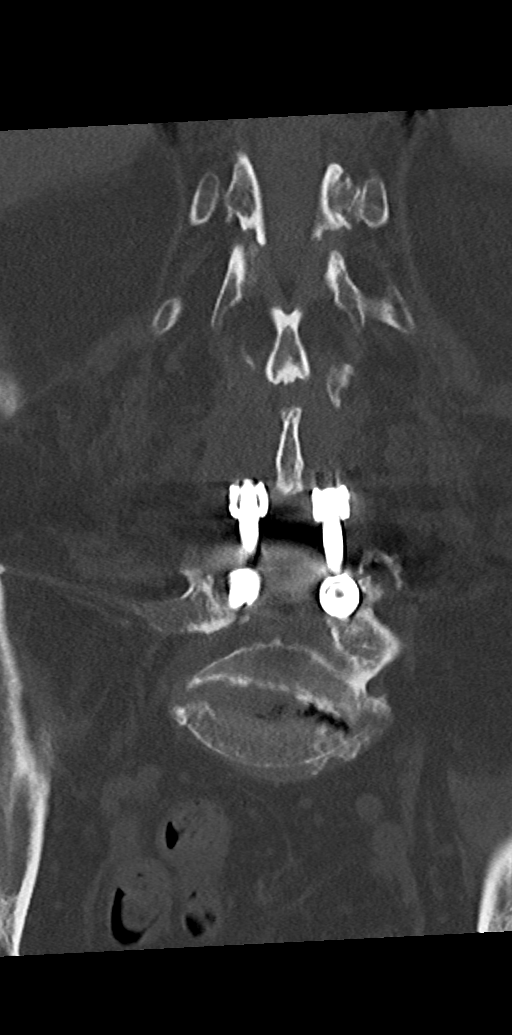

[Series 9: sag st · sagittal · 0.29mm/px · 6 of 91 slices shown]
[im 16/91  bone]
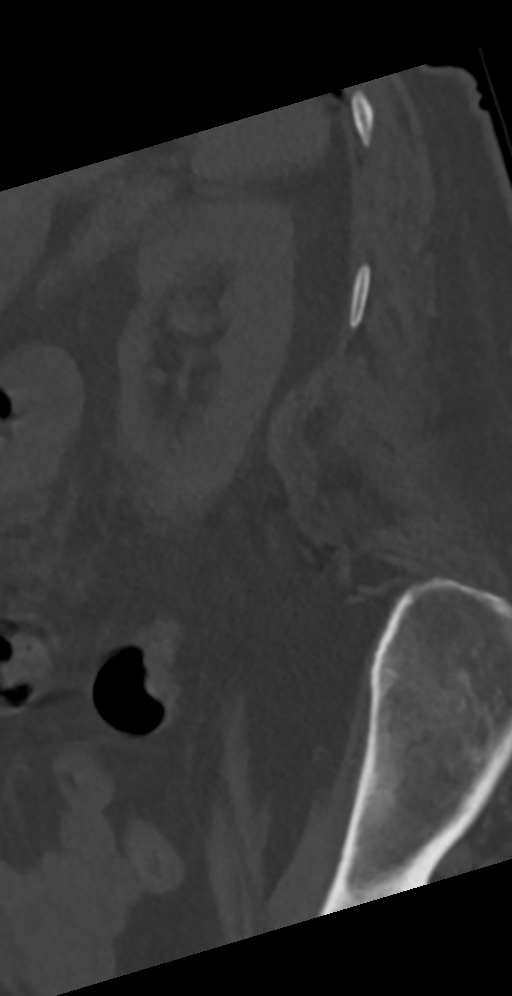
[im 31/91  bone]
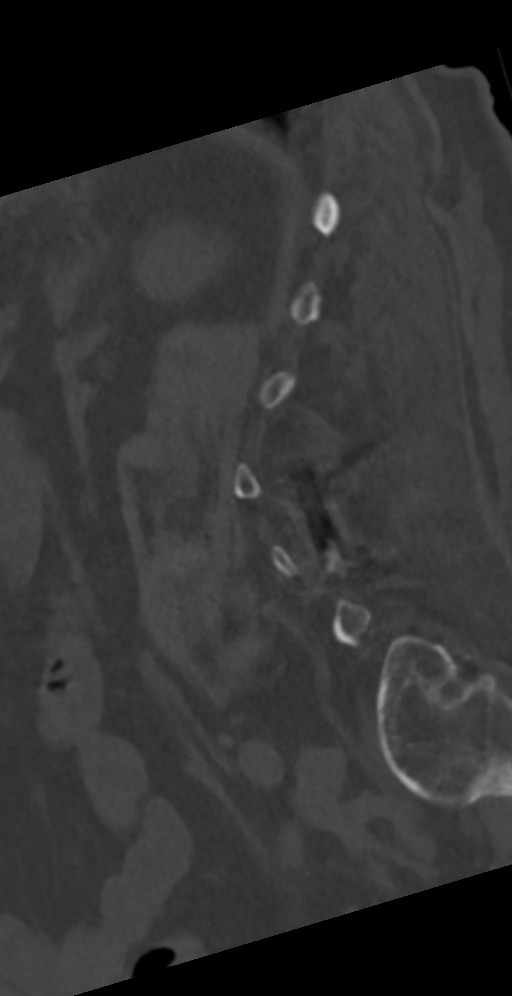
[im 35/91  soft-tissue]
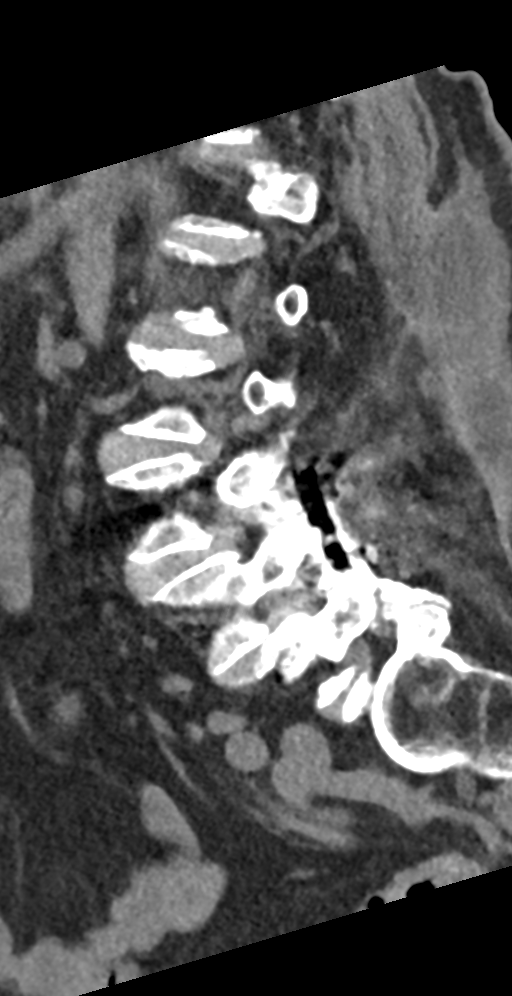
[im 46/91  bone]
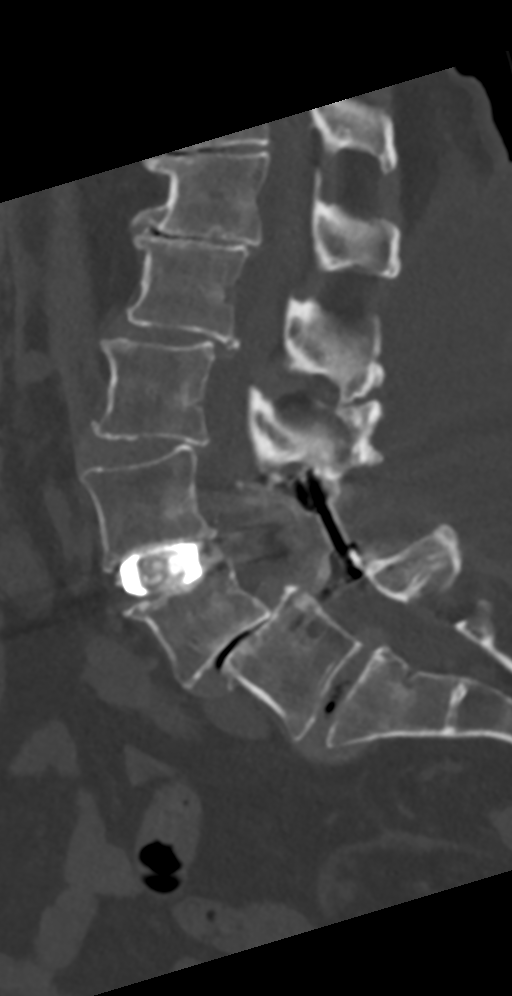
[im 61/91  bone]
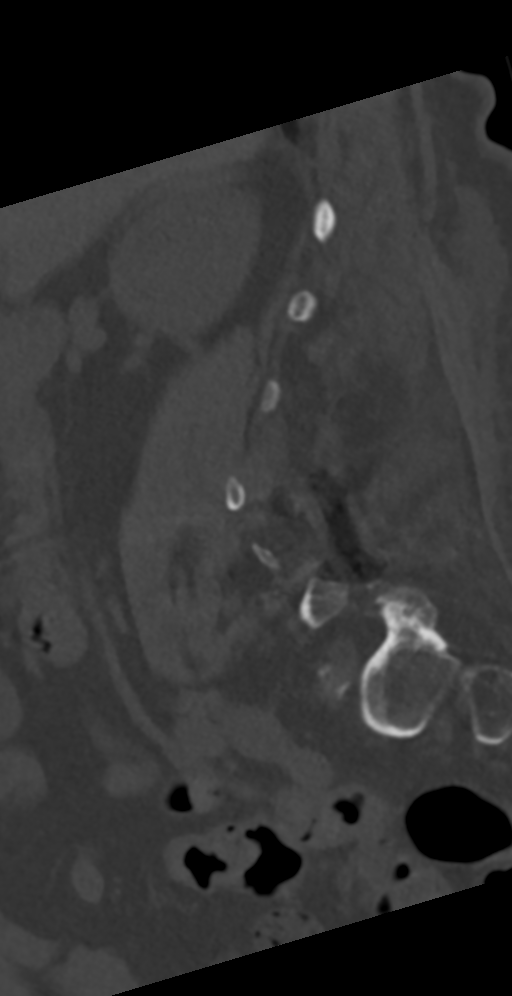
[im 76/91  bone]
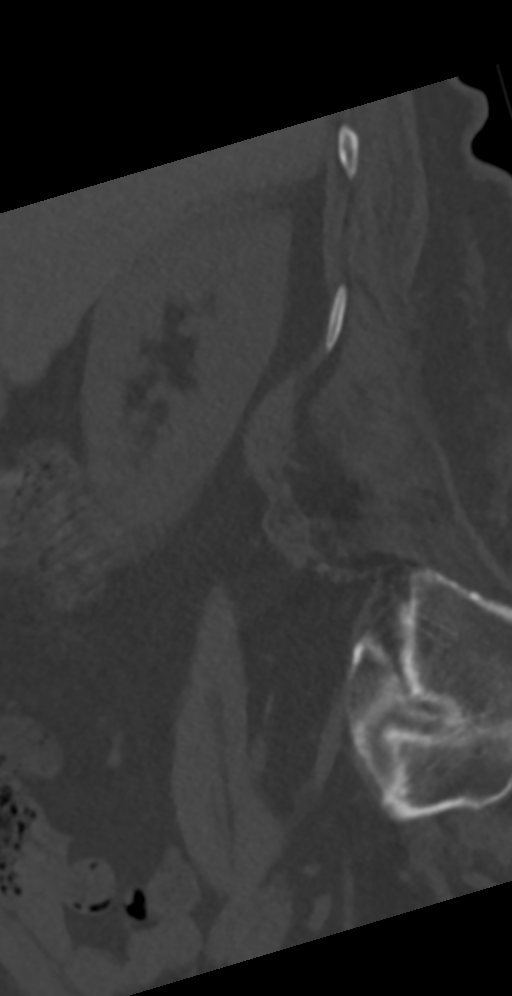

[Series 10: l spine bone · axial · 0.35mm/px · z∈[+1152,+1313]mm · 3 of 128 slices shown, 4 images]
[im 22/128  soft-tissue]
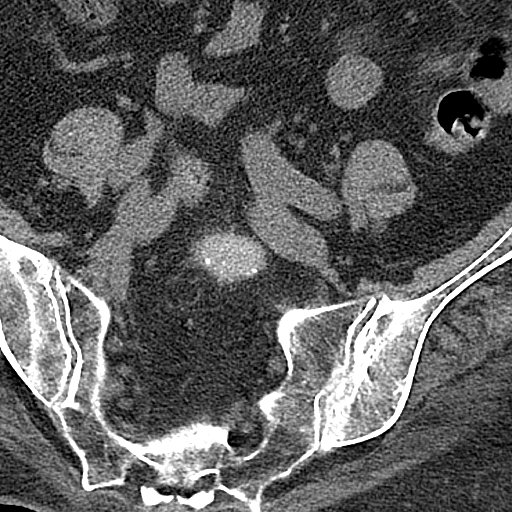
[im 22/128  bone]
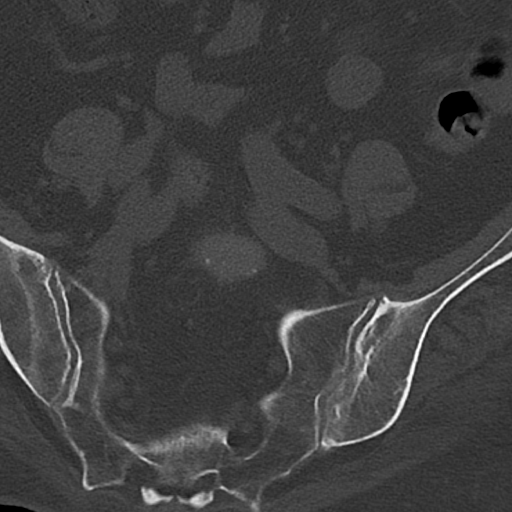
[im 64/128  bone]
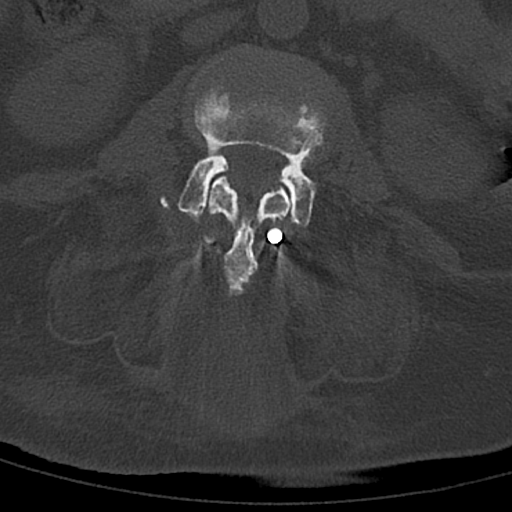
[im 106/128  bone]
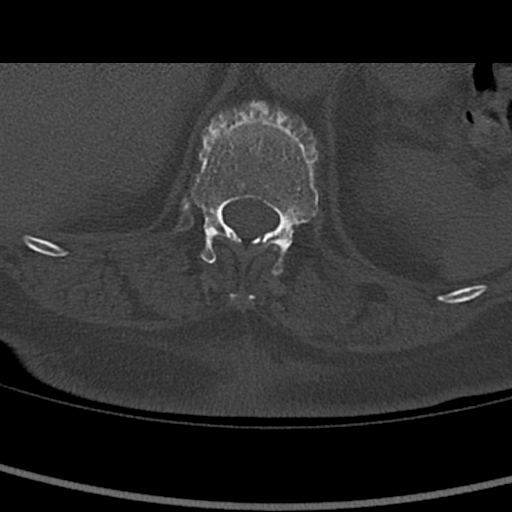

[10 of 35 positions shown; findings below may reference images not displayed]

FINDINGS: Segmentation: 5 lumbar type vertebrae.

Alignment: Slight left convex curvature of the lumbar spine.
Unchanged grade 1 retrolisthesis T12 on L1, L1 on L2, and L2 on L3.
Unchanged 9 mm anterolisthesis of L4 on L5 and trace anterolisthesis
of L5 on S1.

Vertebrae: No acute fracture or suspicious osseous lesion. Sequelae
of L3-4 interbody fusion are again identified, and there is slight
subsidence of the interbody spacers into the L4 superior endplate
with some incorporation of the interbody bone graft. Pedicle screws
and interconnecting rods are present bilaterally at L3 and L5. There
is extensive lucency surrounding both L5 screws with the tip of the
left screw extending slightly anterior to the vertebral body

Paraspinal and other soft tissues: Persistent large fluid collection
extending from the L3-L5 laminectomy bed posteriorly into the
subcutaneous soft tissues measuring approximately 7.5 x 5.5 x
cm, slightly smaller than on the prior MRI. Mild abdominal aortic
atherosclerosis without aneurysm.

Disc levels:

T11-12: Moderate to severe disc space narrowing. Mild disc bulging,
endplate spurring, and mild facet arthrosis without evidence of
significant stenosis.

T12-L1: Severe disc space narrowing. Retrolisthesis with mild
bulging of uncovered disc and mild facet arthrosis. No evidence of
significant stenosis.

L1-2: Moderate disc space narrowing. Retrolisthesis with bulging
uncovered disc and moderate facet hypertrophy result in borderline
to mild spinal stenosis and mild right and moderate left neural
foraminal stenosis, similar to the prior MRI.

L2-3: Mild disc space narrowing. Retrolisthesis with bulging
uncovered disc and moderate to severe facet hypertrophy result in
mild right and moderate left neural foraminal stenosis without
evidence of significant spinal stenosis, similar to the prior MRI.

L3-4: Posterior decompression. Limited assessment of the spinal
canal due to streak artifact. No bony spinal canal or neural
foraminal stenosis.

L4-5: Posterior decompression. Limited assessment of the spinal
canal due to streak artifact without high-grade bony spinal canal
stenosis. Anterolisthesis and severe disc space height loss result
in mild bony neural foraminal stenosis bilaterally, however
foraminal stenosis appeared more severe on the prior MRI related in
part to bulging uncovered disc which is not well demonstrated by CT.

L5-S1: Left eccentric disc bulging and severe facet arthrosis result
in mild right and severe left neural foraminal stenosis with
potential left L5 nerve root impingement, similar to the prior MRI.
No evidence of significant spinal stenosis.
IMPRESSION: 1. L3-4 interbody fusion and L3-L5 posterior fusion with evidence of
bilateral L5 pedicle screw loosening.
2. Slightly decreased size of a large fluid collection extending
from the laminectomy bed posteriorly into the subcutaneous soft
tissues.
3. Advanced disc and facet degeneration with multilevel neural
foraminal stenosis as above.
4. Aortic Atherosclerosis ([id number]).

## 2023-12-19 ENCOUNTER — Other Ambulatory Visit: Payer: Self-pay | Admitting: Internal Medicine

## 2023-12-25 ENCOUNTER — Ambulatory Visit: Payer: Self-pay | Admitting: Internal Medicine

## 2023-12-25 ENCOUNTER — Other Ambulatory Visit (INDEPENDENT_AMBULATORY_CARE_PROVIDER_SITE_OTHER)

## 2023-12-25 ENCOUNTER — Other Ambulatory Visit: Payer: Self-pay | Admitting: Internal Medicine

## 2023-12-25 ENCOUNTER — Encounter: Payer: Self-pay | Admitting: Internal Medicine

## 2023-12-25 DIAGNOSIS — R300 Dysuria: Secondary | ICD-10-CM

## 2023-12-25 LAB — URINALYSIS, ROUTINE W REFLEX MICROSCOPIC
Bilirubin Urine: NEGATIVE
Ketones, ur: NEGATIVE
Nitrite: NEGATIVE
Specific Gravity, Urine: 1.015 (ref 1.000–1.030)
Total Protein, Urine: NEGATIVE
Urine Glucose: NEGATIVE
Urobilinogen, UA: 0.2 (ref 0.0–1.0)
pH: 6 (ref 5.0–8.0)

## 2023-12-25 MED ORDER — CEPHALEXIN 500 MG PO CAPS: 500.0000 mg | ORAL_CAPSULE | Freq: Two times a day (BID) | ORAL | 0 refills | Status: DC

## 2023-12-26 LAB — URINE CULTURE

## 2024-01-03 ENCOUNTER — Ambulatory Visit: Admitting: Internal Medicine

## 2024-01-14 ENCOUNTER — Ambulatory Visit: Payer: Self-pay | Admitting: *Deleted

## 2024-01-14 NOTE — Telephone Encounter (Signed)
 FYI Only or Action Required?: FYI only for provider: ED advised.  Patient was last seen in primary care on 10/05/2023 by Geofm Glade PARAS, MD.  Called Nurse Triage reporting dental abcess (Antibiotic use, diarrhea).  Symptoms began several days ago.  Interventions attempted: Prescription medications: amoxicillin  .  Symptoms are: gradually worsening.  Triage Disposition: See HCP Within 4 Hours (Or PCP Triage)  Patient/caregiver understands and will follow disposition?: Yes Call to PCP office- no open appointment and provider is not in office. No other appointment in surrounding area. Patient states she did contact UC and they are crowded- ED is last resort. Patient states she will try smaller ED for evaluation.   Copied from CRM #[id number]. Topic: Clinical - Red Word Triage >> Jan 14, 2024 10:39 AM Rea ORN wrote: Red Word that prompted transfer to Nurse Triage: pt passed this weekend, EMS was in the home and she thinks she caught something from them. She has said she is burning up and sweating, diarrhea. Pt also has a swollen abscess. Pt said this is all took much and she needs to be well to set up funeral arrangements for spouse >> Jan 14, 2024 10:43 AM Rea ORN wrote: Addending to note that Pt SPOUSE passed away, not pt Reason for Disposition  SEVERE diarrhea (e.g., 7 or more times / day more than normal)  Answer Assessment - Initial Assessment Questions Patient states she started antibiotic(dental abscess) on Saturday with diarrhea starting Saturday evening. Patient states she is unable to drink/eat enough to hydrate her- since her husband passed away Feb 22, 2024 evening. Patient has had low grade fever and fatigue also. Call to [facility]- unable to schedule patient at office. UC/ED options discussed   1. ANTIBIOTIC: What antibiotic are you taking? How many times per day?     Amoxicillin  500mg - three times/day 2. ANTIBIOTIC ONSET: When was the antibiotic started?     Started Saturday 3.  DIARRHEA SEVERITY: How bad is the diarrhea? How many more stools have you had in the past 24 hours than normal?      Multiple- patient is not sure 4. ONSET: When did the diarrhea begin?      Saturday evening  5. BM CONSISTENCY: How loose or watery is the diarrhea?      loose 6. VOMITING: Are you also vomiting? If Yes, ask: How many times in the past 24 hours?      No- some nausea Saturday  7. ABDOMEN PAIN: Are you having any abdomen pain? If Yes, ask: What does it feel like? (e.g., crampy, dull, intermittent, constant)      no 8. ABDOMEN PAIN SEVERITY: If present, ask: How bad is the pain?  (e.g., Scale 1-10; mild, moderate, or severe)     na 9. ORAL INTAKE: If vomiting, Have you been able to drink liquids? How much liquids have you had in the past 24 hours?     Pedialyte, sports drink, ginger ale-sips only  10. HYDRATION: Any signs of dehydration? (e.g., dry mouth [not just dry lips], too weak to stand, dizziness, new weight loss) When did you last urinate?       Dry mouth, this morning with caregiver 11. EXPOSURE: Have you traveled to a foreign country recently? Have you been exposed to anyone with diarrhea? Could you have eaten any food that was spoiled?       No- patient feels SE of antibiotic 12. OTHER SYMPTOMS: Do you have any other symptoms? (e.g., fever, blood in stool)  Chills/fever- 99 today, fatigue, feels yucky  Protocols used: Diarrhea on Antibiotics-A-AH

## 2024-01-21 ENCOUNTER — Encounter: Payer: Self-pay | Admitting: Internal Medicine

## 2024-01-23 MED ORDER — HYDROCODONE-ACETAMINOPHEN 5-325 MG PO TABS: 1.0000 | ORAL_TABLET | Freq: Four times a day (QID) | ORAL | 0 refills | Status: AC | PRN

## 2024-01-31 ENCOUNTER — Other Ambulatory Visit: Payer: Self-pay | Admitting: Internal Medicine

## 2024-02-10 ENCOUNTER — Encounter: Payer: Self-pay | Admitting: Internal Medicine

## 2024-02-10 NOTE — Patient Instructions (Addendum)
" ° ° ° ° °  Blood work was ordered.       Medications changes include :   None    A referral was ordered and someone will call you to schedule an appointment.     Return in about 6 months (around 08/11/2024) for follow up.  "

## 2024-02-10 NOTE — Progress Notes (Unsigned)
 "     Subjective:    Patient ID: Haley Roberts, female    DOB: 13-Jan-1946, 79 y.o.   MRN: [id number]     HPI Haley Roberts is here for follow up of her chronic medical problems.  Cut on toe -   Medications and allergies reviewed with patient and updated if appropriate.  Medications Ordered Prior to Encounter[1]   Review of Systems     Objective:  There were no vitals filed for this visit. BP Readings from Last 3 Encounters:  10/05/23 (!) 154/90  02/13/23 (!) 158/80  04/28/22 (!) 144/83   Wt Readings from Last 3 Encounters:  07/06/21 145 lb 11.6 oz (66.1 kg)  12/08/20 145 lb 12.8 oz (66.1 kg)  09/24/20 145 lb 8.1 oz (66 kg)   There is no height or weight on file to calculate BMI.    Physical Exam     Lab Results  Component Value Date   WBC 5.0 10/05/2023   HGB 11.4 (L) 10/05/2023   HCT 35.8 (L) 10/05/2023   PLT 339.0 10/05/2023   GLUCOSE 102 (H) 10/05/2023   CHOL 168 03/02/2016   TRIG 108 03/02/2016   HDL 66 03/02/2016   LDLCALC 80 03/02/2016   ALT 22 10/05/2023   AST 21 10/05/2023   NA 138 10/05/2023   K 4.1 10/05/2023   CL 102 10/05/2023   CREATININE 0.87 10/05/2023   BUN 19 10/05/2023   CO2 27 10/05/2023   TSH 2.900 02/16/2021   INR 1.1 09/09/2021   HGBA1C 6.7 (H) 10/05/2023   MICROALBUR 0.2 08/22/2007     Assessment & Plan:    See Problem List for Assessment and Plan of chronic medical problems.       [1]  Current Outpatient Medications on File Prior to Visit  Medication Sig Dispense Refill   apixaban  (ELIQUIS ) 2.5 MG TABS tablet Take 1 tablet (2.5 mg total) by mouth 2 (two) times daily. TAKE 1 TABLET(2.5 MG) BY MOUTH TWICE DAILY 180 tablet 3   cephALEXin  (KEFLEX ) 500 MG capsule Take 1 capsule (500 mg total) by mouth 2 (two) times daily. 14 capsule 0   cyclobenzaprine  (FLEXERIL ) 10 MG tablet Take 1 tablet (10 mg total) by mouth 2 (two) times daily as needed for muscle spasms. 180 tablet 3   desvenlafaxine (PRISTIQ) 100 MG 24 hr tablet  Take 100 mg by mouth every morning.     furosemide  (LASIX ) 20 MG tablet TAKE 1 TABLET(20 MG) BY MOUTH DAILY AS NEEDED 90 tablet 3   HYDROcodone -acetaminophen  (NORCO/VICODIN) 5-325 MG tablet Take 1-2 tablets by mouth every 6 (six) hours as needed for moderate pain (pain score 4-6) (chronic joint pain). 240 tablet 0   leflunomide  (ARAVA ) 10 MG tablet Take 1 tablet (10 mg total) by mouth daily. 30 tablet 0   losartan  (COZAAR ) 25 MG tablet TAKE 1 TABLET(25 MG) BY MOUTH DAILY 90 tablet 3   ondansetron  (ZOFRAN ) 4 MG tablet Take 1 tablet (4 mg total) by mouth every 8 (eight) hours as needed for nausea or vomiting. 20 tablet 0   predniSONE  (DELTASONE ) 5 MG tablet TAKE 1 TABLET(5 MG) BY MOUTH DAILY 90 tablet 0   pregabalin  (LYRICA ) 75 MG capsule TAKE 1 CAPSULE(75 MG) BY MOUTH AT BEDTIME 90 capsule 0   vitamin C (ASCORBIC ACID ) 500 MG tablet Take 500 mg by mouth daily.       vitamin E  400 UNIT capsule Take 400 Units by mouth daily.       No current  facility-administered medications on file prior to visit.   "

## 2024-02-12 ENCOUNTER — Ambulatory Visit: Admitting: Internal Medicine

## 2024-02-12 DIAGNOSIS — E1169 Type 2 diabetes mellitus with other specified complication: Secondary | ICD-10-CM

## 2024-02-12 DIAGNOSIS — E1159 Type 2 diabetes mellitus with other circulatory complications: Secondary | ICD-10-CM

## 2024-02-12 DIAGNOSIS — D6869 Other thrombophilia: Secondary | ICD-10-CM

## 2024-02-12 DIAGNOSIS — M069 Rheumatoid arthritis, unspecified: Secondary | ICD-10-CM

## 2024-02-12 DIAGNOSIS — Z86718 Personal history of other venous thrombosis and embolism: Secondary | ICD-10-CM

## 2024-02-18 ENCOUNTER — Encounter: Payer: Self-pay | Admitting: Internal Medicine

## 2024-02-18 NOTE — Progress Notes (Unsigned)
" ° ° ° ° °  Subjective:    Patient ID: Haley Roberts, female    DOB: Sep 10, 1945, 79 y.o.   MRN: [id number]     HPI Haley Roberts is here for follow up of her chronic medical problems.  Cut on toe -   Medications and allergies reviewed with patient and updated if appropriate.  Medications Ordered Prior to Encounter[1]   Review of Systems     Objective:  There were no vitals filed for this visit. BP Readings from Last 3 Encounters:  10/05/23 (!) 154/90  02/13/23 (!) 158/80  04/28/22 (!) 144/83   Wt Readings from Last 3 Encounters:  07/06/21 145 lb 11.6 oz (66.1 kg)  12/08/20 145 lb 12.8 oz (66.1 kg)  09/24/20 145 lb 8.1 oz (66 kg)   There is no height or weight on file to calculate BMI.    Physical Exam     Lab Results  Component Value Date   WBC 5.0 10/05/2023   HGB 11.4 (L) 10/05/2023   HCT 35.8 (L) 10/05/2023   PLT 339.0 10/05/2023   GLUCOSE 102 (H) 10/05/2023   CHOL 168 03/02/2016   TRIG 108 03/02/2016   HDL 66 03/02/2016   LDLCALC 80 03/02/2016   ALT 22 10/05/2023   AST 21 10/05/2023   NA 138 10/05/2023   K 4.1 10/05/2023   CL 102 10/05/2023   CREATININE 0.87 10/05/2023   BUN 19 10/05/2023   CO2 27 10/05/2023   TSH 2.900 02/16/2021   INR 1.1 09/09/2021   HGBA1C 6.7 (H) 10/05/2023   MICROALBUR 0.2 08/22/2007     Assessment & Plan:    See Problem List for Assessment and Plan of chronic medical problems.        [1]  Current Outpatient Medications on File Prior to Visit  Medication Sig Dispense Refill   apixaban  (ELIQUIS ) 2.5 MG TABS tablet Take 1 tablet (2.5 mg total) by mouth 2 (two) times daily. TAKE 1 TABLET(2.5 MG) BY MOUTH TWICE DAILY 180 tablet 3   cyclobenzaprine  (FLEXERIL ) 10 MG tablet Take 1 tablet (10 mg total) by mouth 2 (two) times daily as needed for muscle spasms. 180 tablet 3   desvenlafaxine (PRISTIQ) 100 MG 24 hr tablet Take 100 mg by mouth every morning.     furosemide  (LASIX ) 20 MG tablet TAKE 1 TABLET(20 MG) BY MOUTH DAILY AS  NEEDED 90 tablet 3   HYDROcodone -acetaminophen  (NORCO/VICODIN) 5-325 MG tablet Take 1-2 tablets by mouth every 6 (six) hours as needed for moderate pain (pain score 4-6) (chronic joint pain). 240 tablet 0   leflunomide  (ARAVA ) 10 MG tablet Take 1 tablet (10 mg total) by mouth daily. 30 tablet 0   losartan  (COZAAR ) 25 MG tablet TAKE 1 TABLET(25 MG) BY MOUTH DAILY 90 tablet 3   ondansetron  (ZOFRAN ) 4 MG tablet Take 1 tablet (4 mg total) by mouth every 8 (eight) hours as needed for nausea or vomiting. 20 tablet 0   predniSONE  (DELTASONE ) 5 MG tablet TAKE 1 TABLET(5 MG) BY MOUTH DAILY 90 tablet 0   pregabalin  (LYRICA ) 75 MG capsule TAKE 1 CAPSULE(75 MG) BY MOUTH AT BEDTIME 90 capsule 0   vitamin C (ASCORBIC ACID ) 500 MG tablet Take 500 mg by mouth daily.       vitamin E  400 UNIT capsule Take 400 Units by mouth daily.       No current facility-administered medications on file prior to visit.   "

## 2024-02-20 ENCOUNTER — Encounter: Payer: Self-pay | Admitting: Internal Medicine

## 2024-02-20 ENCOUNTER — Ambulatory Visit: Admitting: Internal Medicine

## 2024-02-20 DIAGNOSIS — G894 Chronic pain syndrome: Secondary | ICD-10-CM

## 2024-02-20 DIAGNOSIS — Z86718 Personal history of other venous thrombosis and embolism: Secondary | ICD-10-CM

## 2024-02-20 DIAGNOSIS — I152 Hypertension secondary to endocrine disorders: Secondary | ICD-10-CM

## 2024-02-20 DIAGNOSIS — M069 Rheumatoid arthritis, unspecified: Secondary | ICD-10-CM

## 2024-02-20 DIAGNOSIS — E1169 Type 2 diabetes mellitus with other specified complication: Secondary | ICD-10-CM

## 2024-02-20 DIAGNOSIS — R600 Localized edema: Secondary | ICD-10-CM

## 2024-02-20 DIAGNOSIS — F3289 Other specified depressive episodes: Secondary | ICD-10-CM

## 2024-02-20 NOTE — Assessment & Plan Note (Signed)
 Chronic Likely multifactorial Rheumatoid arthritis, osteoarthritis, chronic back pain s/p surgery 2022 with complications In addition contributing are physical deconditioning, being sedentary On Lyrica  75 mg at bedtime On hydrocodone  5-325 mg taking 1-2 tablets every 6 hours as needed-typically takes 2 pills twice a day Continue above regimen   controlled substance database checked and she is taking medication appropriately

## 2024-02-20 NOTE — Assessment & Plan Note (Signed)
 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

## 2024-02-20 NOTE — Assessment & Plan Note (Signed)
 Chronic Has bilateral leg swelling- RLE > LLE Swelling to right lower extremity worse after back surgery history of bilateral DVTs at 2 different times, sedentary and has her legs down most of the time Continue Lasix  20 mg daily as needed-she is not taking this often because of the difficulty getting up and getting to the bathroom, but can take it as needed CMP

## 2024-02-20 NOTE — Assessment & Plan Note (Signed)
 Chronic Management per Dr. Mai On prednisone  5 mg daily and leflunomide  10 mg daily I will continue her hydrocodone  1-2 tabs every 6 hours as needed for severe pain-this medication is well-tolerated and I believe the benefits outweigh the risks as she does

## 2024-02-20 NOTE — Assessment & Plan Note (Signed)
 Chronic Following with psychiatry Controlled On Pristiq 100 mg daily

## 2024-02-20 NOTE — Assessment & Plan Note (Signed)
 Chronic Lab Results  Component Value Date   HGBA1C 6.7 (H) 10/05/2023   Check a1c Low sugar/carbohydrate diet

## 2024-02-20 NOTE — Assessment & Plan Note (Signed)
 Chronic BP elevated here today - has whitecoat htn in doctor's offices Has not been checking it recently and advised her to check it regularly for a week or 2 No change in medication Continue losartan 25 mg daily CBC, CMP

## 2024-03-06 ENCOUNTER — Ambulatory Visit: Admitting: Internal Medicine
